# Patient Record
Sex: Male | Born: 1940 | Race: White | Hispanic: No | Marital: Single | Smoking: Former smoker
Health system: Southern US, Community
[De-identification: ages and names within clinical notes are randomized; demographics above are authoritative.]

## PROBLEM LIST (undated history)

## (undated) DIAGNOSIS — I351 Nonrheumatic aortic (valve) insufficiency: Secondary | ICD-10-CM

## (undated) DIAGNOSIS — F32A Depression, unspecified: Secondary | ICD-10-CM

## (undated) DIAGNOSIS — I493 Ventricular premature depolarization: Secondary | ICD-10-CM

## (undated) DIAGNOSIS — I447 Left bundle-branch block, unspecified: Secondary | ICD-10-CM

## (undated) DIAGNOSIS — I4891 Unspecified atrial fibrillation: Secondary | ICD-10-CM

## (undated) DIAGNOSIS — Z8679 Personal history of other diseases of the circulatory system: Secondary | ICD-10-CM

## (undated) DIAGNOSIS — K7460 Unspecified cirrhosis of liver: Secondary | ICD-10-CM

## (undated) DIAGNOSIS — N529 Male erectile dysfunction, unspecified: Secondary | ICD-10-CM

## (undated) DIAGNOSIS — T7840XA Allergy, unspecified, initial encounter: Secondary | ICD-10-CM

## (undated) DIAGNOSIS — F419 Anxiety disorder, unspecified: Secondary | ICD-10-CM

## (undated) DIAGNOSIS — Z8619 Personal history of other infectious and parasitic diseases: Secondary | ICD-10-CM

## (undated) DIAGNOSIS — F329 Major depressive disorder, single episode, unspecified: Secondary | ICD-10-CM

## (undated) DIAGNOSIS — I10 Essential (primary) hypertension: Secondary | ICD-10-CM

## (undated) HISTORY — DX: Personal history of other infectious and parasitic diseases: Z86.19

## (undated) HISTORY — DX: Allergy, unspecified, initial encounter: T78.40XA

## (undated) HISTORY — DX: Anxiety disorder, unspecified: F41.9

## (undated) HISTORY — DX: Personal history of other diseases of the circulatory system: Z86.79

## (undated) HISTORY — DX: Major depressive disorder, single episode, unspecified: F32.9

## (undated) HISTORY — PX: OTHER SURGICAL HISTORY: SHX169

## (undated) HISTORY — DX: Depression, unspecified: F32.A

## (undated) HISTORY — DX: Male erectile dysfunction, unspecified: N52.9

---
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
## 2005-02-14 ENCOUNTER — Encounter: Payer: Self-pay | Admitting: Family Medicine

## 2005-03-04 ENCOUNTER — Encounter: Payer: Self-pay | Admitting: Family Medicine

## 2006-11-25 ENCOUNTER — Encounter: Payer: Self-pay | Admitting: Family Medicine

## 2006-11-25 LAB — CONVERTED CEMR LAB
Cholesterol: 152 mg/dL
Glucose, Bld: 100 mg/dL
HDL: 60 mg/dL
LDL Cholesterol: 78 mg/dL

## 2008-05-03 ENCOUNTER — Encounter: Payer: Self-pay | Admitting: Family Medicine

## 2010-04-29 DIAGNOSIS — Z8679 Personal history of other diseases of the circulatory system: Secondary | ICD-10-CM

## 2010-04-29 HISTORY — DX: Personal history of other diseases of the circulatory system: Z86.79

## 2010-06-05 ENCOUNTER — Ambulatory Visit: Payer: Self-pay | Admitting: Family Medicine

## 2010-06-07 ENCOUNTER — Ambulatory Visit (INDEPENDENT_AMBULATORY_CARE_PROVIDER_SITE_OTHER): Payer: Medicare Other | Admitting: Family Medicine

## 2010-06-07 ENCOUNTER — Encounter: Payer: Self-pay | Admitting: Family Medicine

## 2010-06-07 DIAGNOSIS — I493 Ventricular premature depolarization: Secondary | ICD-10-CM | POA: Insufficient documentation

## 2010-06-07 DIAGNOSIS — Z23 Encounter for immunization: Secondary | ICD-10-CM

## 2010-06-07 DIAGNOSIS — N529 Male erectile dysfunction, unspecified: Secondary | ICD-10-CM

## 2010-06-07 DIAGNOSIS — Z9189 Other specified personal risk factors, not elsewhere classified: Secondary | ICD-10-CM | POA: Insufficient documentation

## 2010-06-07 DIAGNOSIS — F341 Dysthymic disorder: Secondary | ICD-10-CM | POA: Insufficient documentation

## 2010-06-07 DIAGNOSIS — J309 Allergic rhinitis, unspecified: Secondary | ICD-10-CM | POA: Insufficient documentation

## 2010-06-07 DIAGNOSIS — I00 Rheumatic fever without heart involvement: Secondary | ICD-10-CM | POA: Insufficient documentation

## 2010-06-07 DIAGNOSIS — Z0000 Encounter for general adult medical examination without abnormal findings: Secondary | ICD-10-CM

## 2010-06-07 DIAGNOSIS — K219 Gastro-esophageal reflux disease without esophagitis: Secondary | ICD-10-CM

## 2010-06-07 DIAGNOSIS — I351 Nonrheumatic aortic (valve) insufficiency: Secondary | ICD-10-CM | POA: Insufficient documentation

## 2010-06-08 ENCOUNTER — Telehealth (INDEPENDENT_AMBULATORY_CARE_PROVIDER_SITE_OTHER): Payer: Self-pay | Admitting: *Deleted

## 2010-06-11 ENCOUNTER — Encounter (INDEPENDENT_AMBULATORY_CARE_PROVIDER_SITE_OTHER): Payer: Self-pay | Admitting: *Deleted

## 2010-06-11 ENCOUNTER — Ambulatory Visit: Payer: Medicare Other

## 2010-06-11 ENCOUNTER — Other Ambulatory Visit: Payer: Self-pay | Admitting: Family Medicine

## 2010-06-11 DIAGNOSIS — Z136 Encounter for screening for cardiovascular disorders: Secondary | ICD-10-CM

## 2010-06-11 DIAGNOSIS — Z833 Family history of diabetes mellitus: Secondary | ICD-10-CM

## 2010-06-11 LAB — BASIC METABOLIC PANEL
CO2: 28 mEq/L (ref 19–32)
Calcium: 9.1 mg/dL (ref 8.4–10.5)
Creatinine, Ser: 1.1 mg/dL (ref 0.4–1.5)
Glucose, Bld: 91 mg/dL (ref 70–99)

## 2010-06-11 LAB — LIPID PANEL
HDL: 49.2 mg/dL (ref >39.00)
Triglycerides: 45 mg/dL (ref 0.0–149.0)

## 2010-06-11 LAB — HEPATIC FUNCTION PANEL
Albumin: 3.8 g/dL (ref 3.5–5.2)
Alkaline Phosphatase: 48 U/L (ref 39–117)
Total Protein: 6.6 g/dL (ref 6.0–8.3)

## 2010-06-14 ENCOUNTER — Encounter: Payer: Self-pay | Admitting: Family Medicine

## 2010-06-14 NOTE — Assessment & Plan Note (Signed)
Summary: new pt to est JRT   Vital Signs:  Patient profile:   70 year old male Height:      75 inches Weight:      251.25 pounds BMI:     31.52 Temp:     97.9 degrees F oral Pulse rate:   84 / minute Pulse rhythm:   regular BP sitting:   156 / 76  (left arm) Cuff size:   large  Vitals Entered By: Christena Deem CMA Deborra Medina) (June 07, 2010 2:02 PM) CC: New patient to Establish   History of Present Illness: New patient to clinic. Prev [facility] and [facility] patient.    CPE: See plan. Healthy habits d/w patient, encouarged.   Bothered by chronic nasal congestion and cough, esp at night.  This has been going on for years.  He describes a potential reflux component with occ heartburn.    ED- occ cialis use, out of it now.  Prev with good relief.  BP prev controlled.   Anxiety:  Prev on xanax during period of stress when a relationship fell apart.  Caring for father on hospice.  Xanax has helped the patient; prev w/o side effects.  No SI/HI.   H/o depression but this has been controlled off SSRI/other meds.   H/o rheumatic fever.   H/o heart murmur.  Years since seeing cards. Needs to reest care.  Was prev on BB for arrhythmia, but came off this years ago.  No CP, shortness of breath, and only minimal bilateral lower extremity edema (trace, and this is episodic).  Preventive Screening-Counseling & Management  Alcohol-Tobacco     Smoking Status: quit  Caffeine-Diet-Exercise     Does Patient Exercise: no      Drug Use:  no.    Allergies (verified): 1)  ! * Flonase  Past History:  Family History: Last updated: 06/07/2010 Family History Hypertension, other blood relative Family History Ovarian cancer, parents Family History of Prostate CA 1st degree relative <50, parents Family History Uterine cancer, parents Family History of Heart disease, other blood relative Family History Diabetes 1st degree relative, other blood relative F alive in his 51s, COPD on hospice, hip replaced in  his 1s.  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 of GERD (ICD-530.81) ORGANIC IMPOTENCE (ICD-607.84) FAMILY HISTORY DIABETES 1ST DEGREE RELATIVE (ICD-V18.0) FAMILY HISTORY OF CAD MALE 1ST DEGREE RELATIVE <60 (ICD-V16.49) ANXIETY DEPRESSION (ICD-300.4)  Past Surgical History: 1947  Tonsils and adenoids R elbow bursa removed  H/o of cautery for nosebleeds  Family History: Family History Hypertension, other blood relative Family History Ovarian cancer, parents Family History of Prostate CA 1st degree relative <50, parents Family History Uterine cancer, parents Family History of Heart disease, other blood relative Family History Diabetes 1st degree relative, other blood relative F alive in his 40s, COPD on hospice, hip replaced in his 69s.  M dead, ovarian cancer, AAA in 06/27/1983  Social History: Occupation:  Environmental education officer, retired Education:  Personnel officer Divorced Former Smoker Alcohol use-yes, rare Drug use-no Regular exercise-no enjoys travel/riding motorcycle/nascarSmoking Status:  quit Drug Use:  no Does Patient Exercise:  no  Review of Systems       See HPI.  Otherwise  negative.    Physical Exam  General:  no apparent distress normocephalic atraumatic tm wnl clear rhinorrhea op w/o erythema neck supple regular rate and rhythm with occ ectopy, SEM noted clear to auscultation bilaterally abdomen obese, not tender to palpation ext w/trace edema bilaterally Prostate:  Prostate gland firm and smooth, mild enlargement, but no nodularity, tenderness,  mass, asymmetry or induration.   Impression & Recommendations:  Problem # 1:  Preventive Health Care (ICD-V70.0) health habits d/w patient, esp diet and exercise. flu shot encouraged.  PSA options were discussed along with recent recs.  No indication for psa at this point, since patient is low risk and there is no FH of prosate CA.  He declined testing of PSA.  zostavax encouracted, PNA vaccine done today. colonoscopy 2011, requesting records.   Problem # 2:  FAMILY HISTORY DIABETES 1ST DEGREE RELATIVE (ICD-V18.0) Return for labs.    Problem # 3:  ANXIETY DEPRESSION (ICD-300.4) Start xanax and use as needed wiht sedation caution.  He agrees.  i think this is likely exacerbate by father's illness and he has no SI/HI.  Okay for outpatient follow up.   Problem # 4:  CARDIAC MURMUR (ICD-785.2) Refer back to [facility] [employer] to reest care.  Orders: Cardiology Referral (Cardiology)  Problem # 5:  ORGANIC IMPOTENCE (ICD-607.84) Restart cialis.  no CP and no NTG use.  His updated medication list for this problem includes:    Cialis 20 Mg Tabs (Tadalafil) .Marland Kitchen... 1/2 to 1 tab by mouth once daily as needed  Problem # 6:  ? of GERD (ICD-530.81) With cough.  intolerant of nasal steroids prev.  Start with zantac and see if this affects symptoms.  Pt is aware that he is overweight.   Complete Medication List: 1)  Alprazolam 0.25 Mg Tabs (Alprazolam) .Marland Kitchen.. 1 by mouth three times a day as needed for anxiety, sedation caution 2)  Cialis 20 Mg Tabs (Tadalafil) .... 1/2 to 1 tab by mouth once daily as needed 3)  Tessalon 200 Mg Caps (Benzonatate) .Marland Kitchen.. 1 by mouth three times a day as needed for cough  Other Orders: Pneumococcal Vaccine (54270) Admin 1st Vaccine [phone])   Patient Instructions: 1)  Check with your insurance to see if they will cover the shingles shot.  I would get a flu shot. 2)  We'll request your records.   3)  Come back for labs -fasting cmet/lipid- v18.0.  4)  Try the xanax as needed  three times a day.  It can make you drowsy.  Call me with an update. 5)  Take the cialis as needed.   6)  See Rosaria Ferries about your referral before your leave today.   7)  Use zantac/ranitidine 35m by mouth two times a day and see if that helps with your congestion in your chest/heartburn.  let me know if you aren't improving.  8)  Take care.  Prescriptions: TESSALON 200 MG CAPS (BENZONATATE) 1 by mouth three times a day as needed for cough  #30 x 1   Entered and Authorized by:   GElsie StainMD   Signed by:   GElsie StainMD on 06/07/2010   Method used:   Print then Give to Patient   RxID:   [id number] 20 MG TABS (TADALAFIL) 1/2 to 1 tab by mouth once daily as needed  #10 x 1   Entered and Authorized by:   GElsie StainMD   Signed by:   GElsie StainMD on 06/07/2010   Method  used:   Print then Give to Patient   RxID:   [id number] ALPRAZOLAM 0.25 MG TABS (ALPRAZOLAM) 1 by mouth three times a day as needed for anxiety, sedation caution  #30 x 1   Entered and Authorized by:   Elsie Stain MD   Signed by:   Elsie Stain MD on 06/07/2010   Method used:   Print then Give to Patient   RxID:   [id number]    Orders Added: 1)  New Patient 65&> [92010] 2)  New Patient Level II [07121] 3)  Cardiology Referral [Cardiology] 4)  Pneumococcal Vaccine [90732] 5)  Admin 1st Vaccine [97588]   Immunizations Administered:  Pneumonia Vaccine:    Vaccine Type: Pneumovax (Medicare)    Site: left deltoid    Mfr: Merck    Dose: 0.5 ml    Route: IM    Given by: Christena Deem CMA ([place])    Exp. Date: 09/21/2011    Lot #: [id number]    VIS given: 04/03/09 version given June 07, 2010.   Immunizations Administered:  Pneumonia Vaccine:    Vaccine Type: Pneumovax (Medicare)    Site: left deltoid    Mfr: Merck    Dose: 0.5 ml    Route: IM    Given by: Christena Deem CMA ([place])    Exp. Date: 09/21/2011    Lot #: [id number]    VIS given: 04/03/09 version given June 07, 2010.  Prior Medications (reviewed today): None Current Allergies (reviewed today): ! [facility]

## 2010-06-14 NOTE — Progress Notes (Signed)
----   Converted from flag ---- ---- 06/07/2010 10:51 PM, Elsie Stain MD wrote: I think this is reasonable.  Please notify patient and convert this to a note (or copy it into the phone note).  thanks.   ---- 06/07/2010 3:06 PM, Christena Deem CMA (AAMA) wrote: As this guy was leaving, he asked what to do about his BP.  It said the reading that I got was higher than usual.  He said he monitored it for about a month with a machine that was calibrated by a MD and it stayed around 135/80.  Shall we just check it again at his next OV? ------------------------------

## 2010-06-18 ENCOUNTER — Encounter: Payer: Self-pay | Admitting: Family Medicine

## 2010-06-19 ENCOUNTER — Encounter: Payer: Self-pay | Admitting: Family Medicine

## 2010-06-20 NOTE — Assessment & Plan Note (Signed)
Summary: FASTING CMET LIPID V18.0/RBH   Allergies: 1)  ! * Flonase   Complete Medication List: 1)  Alprazolam 0.25 Mg Tabs (Alprazolam) .Marland Kitchen.. 1 by mouth three times a day as needed for anxiety, sedation caution 2)  Cialis 20 Mg Tabs (Tadalafil) .... 1/2 to 1 tab by mouth once daily as needed 3)  Tessalon 200 Mg Caps (Benzonatate) .Marland Kitchen.. 1 by mouth three times a day as needed for cough  Other Orders: TLB-BMP (Basic Metabolic Panel-BMET) (96295-MWUXLKG) TLB-Hepatic/Liver Function Pnl (80076-HEPATIC) TLB-Lipid Panel (80061-LIPID)   Orders Added: 1)  TLB-BMP (Basic Metabolic Panel-BMET) [40102-VOZDGUY] 2)  TLB-Hepatic/Liver Function Pnl [80076-HEPATIC] 3)  TLB-Lipid Panel [80061-LIPID]

## 2010-06-23 DIAGNOSIS — I447 Left bundle-branch block, unspecified: Secondary | ICD-10-CM | POA: Insufficient documentation

## 2010-06-26 NOTE — Miscellaneous (Signed)
  Clinical Lists Changes  Observations: Added new observation of COLONNXTDUE: 06/2018 (06/19/2010 13:55) Added new observation of COLONOSCOPY: Diverticulosis (06/14/2008 13:57)      Preventive Care Screening  Colonoscopy:    Date:  06/14/2008    Next Due:  06/2018    Results:  Diverticulosis     Multiple medium-mouthed diverticula were found in the sigmoid colon, in the descending colon and in the transverse colon.

## 2010-06-26 NOTE — Letter (Signed)
Summary: [facility] Cardiology  [facility]iology   Imported By: Laural Benes 06/22/2010 15:20:40  _____________________________________________________________________  External Attachment:    Type:   Image     Comment:   External Document  Appended Document: [facility] Cardiology    Clinical Lists Changes  Problems: Added new problem of LBBB (ICD-426.3) Observations: Added new observation of PAST MED HX: DEPRESSION (ICD-311) ALLERGIC RHINITIS (ICD-477.9) CHICKENPOX, HX OF (ICD-V15.9) h/o RHEUMATIC FEVER (ICD-390), (per patient)- eval by Sadie [facility]er cards 2012 LBBB with PVCs ? of GERD (ICD-530.81) ORGANIC IMPOTENCE (ICD-607.84) FAMILY HISTORY DIABETES 1ST DEGREE RELATIVE (ICD-V18.0) FAMILY HISTORY OF CAD MALE 1ST DEGREE RELATIVE <60 (ICD-V16.49) ANXIETY DEPRESSION (ICD-300.4)  (06/23/2010 21:41)       Past History:  Past Medical History: DEPRESSION (ICD-311) ALLERGIC RHINITIS (ICD-477.9) CHICKENPOX, HX OF (ICD-V15.9) h/o RHEUMATIC FEVER (ICD-390), (per patient)- eval by Sadie Haber cards 2012 LBBB with PVCs ? of GERD (ICD-530.81) ORGANIC IMPOTENCE (ICD-607.84) FAMILY HISTORY DIABETES 1ST DEGREE RELATIVE (ICD-V18.0) FAMILY HISTORY OF CAD MALE 1ST DEGREE RELATIVE <60 (ICD-V16.49) ANXIETY DEPRESSION (ICD-300.4)

## 2010-06-27 ENCOUNTER — Encounter: Payer: Self-pay | Admitting: Family Medicine

## 2010-07-05 NOTE — Procedures (Signed)
Summary: colonoscopy  colonoscopy   Imported By: Jamelle Haring 06/26/2010 07:54:21  _____________________________________________________________________  External Attachment:    Type:   Image     Comment:   External Document

## 2010-07-05 NOTE — Letter (Signed)
Summary: office note  office note   Imported By: Jamelle Haring 06/26/2010 07:56:03  _____________________________________________________________________  External Attachment:    Type:   Image     Comment:   External Document

## 2010-07-05 NOTE — Miscellaneous (Signed)
  Clinical Lists Changes  Observations: Added new observation of TD BOOSTER: Td (11/25/2006 8:28) Added new observation of PSA: 0.7 ng/mL (11/25/2006 8:24) Added new observation of CREATININE: 0.95 mg/dL (11/25/2006 8:24) Added new observation of BG RANDOM: 100 mg/dL (11/25/2006 8:24) Added new observation of LDL: 78 mg/dL (11/25/2006 8:24) Added new observation of HDL: 60 mg/dL (11/25/2006 8:24) Added new observation of TRIGLYC TOT: 72 mg/dL (11/25/2006 8:24) Added new observation of CHOLESTEROL: 152 mg/dL (11/25/2006 8:24)      Preventive Care Screening  Last Tetanus Booster:    Date:  11/25/2006    Results:  Td  PSA:    Date:  11/25/2006    Results:  0.7 ng/mL

## 2011-07-25 ENCOUNTER — Telehealth: Payer: Self-pay | Admitting: Family Medicine

## 2011-07-25 ENCOUNTER — Emergency Department (HOSPITAL_COMMUNITY)
Admission: EM | Admit: 2011-07-25 | Discharge: 2011-07-25 | Disposition: A | Discharge status: Home / Self Care | Payer: Medicare Other | Source: Home / Self Care | Attending: Emergency Medicine | Admitting: Emergency Medicine

## 2011-07-25 ENCOUNTER — Encounter (HOSPITAL_COMMUNITY): Payer: Self-pay | Admitting: *Deleted

## 2011-07-25 DIAGNOSIS — E860 Dehydration: Secondary | ICD-10-CM

## 2011-07-25 HISTORY — DX: Essential (primary) hypertension: I10

## 2011-07-25 LAB — POCT URINALYSIS DIP (DEVICE)
Ketones, ur: NEGATIVE mg/dL
Protein, ur: 30 mg/dL — AB
Specific Gravity, Urine: 1.02 (ref 1.005–1.030)
pH: 6 (ref 5.0–8.0)

## 2011-07-25 LAB — POCT I-STAT, CHEM 8
BUN: 20 mg/dL (ref 6–23)
Calcium, Ion: 1.15 mmol/L (ref 1.12–1.32)
Creatinine, Ser: 1 mg/dL (ref 0.50–1.35)
TCO2: 24 mmol/L (ref 0–100)

## 2011-07-25 NOTE — Discharge Instructions (Signed)
Today we have discussed your current physical exam and lab evaluation, suspected so your symptoms could be related to a dehydration status. Have encouraged you to drink no minimum of 2 L in the next 12 hours and be conscious and alert about your urinary status after 12 hours.  Abdominal discomfort persist please try to monitor your temperatures and followup with her primary care Dr. has discuss    Dehydration, Adult Dehydration is when you lose more fluids from the body than you take in. Vital organs like the kidneys, brain, and heart cannot function without a proper amount of fluids and salt. Any loss of fluids from the body can cause dehydration.  CAUSES   Vomiting.   Diarrhea.   Excessive sweating.   Excessive urine output.   Fever.  SYMPTOMS  Mild dehydration  Thirst.   Dry lips.   Slightly dry mouth.  Moderate dehydration  Very dry mouth.   Sunken eyes.   Skin does not bounce back quickly when lightly pinched and released.   Dark urine and decreased urine production.   Decreased tear production.   Headache.  Severe dehydration  Very dry mouth.   Extreme thirst.   Rapid, weak pulse (more than 100 beats per minute at rest).   Cold hands and feet.   Not able to sweat in spite of heat and temperature.   Rapid breathing.   Blue lips.   Confusion and lethargy.   Difficulty being awakened.   Minimal urine production.   No tears.  DIAGNOSIS  Your caregiver will diagnose dehydration based on your symptoms and your exam. Blood and urine tests will help confirm the diagnosis. The diagnostic evaluation should also identify the cause of dehydration. TREATMENT  Treatment of mild or moderate dehydration can often be done at home by increasing the amount of fluids that you drink. It is best to drink small amounts of fluid more often. Drinking too much at one time can make vomiting worse. Refer to the home care instructions below. Severe dehydration needs to  be treated at the hospital where you will probably be given intravenous (IV) fluids that contain water and electrolytes. HOME CARE INSTRUCTIONS   Ask your caregiver about specific rehydration instructions.   Drink enough fluids to keep your urine clear or pale yellow.   Drink small amounts frequently if you have nausea and vomiting.   Eat as you normally do.   Avoid:   Foods or drinks high in sugar.   Carbonated drinks.   Juice.   Extremely hot or cold fluids.   Drinks with caffeine.   Fatty, greasy foods.   Alcohol.   Tobacco.   Overeating.   Gelatin desserts.   Wash your hands well to avoid spreading bacteria and viruses.   Only take over-the-counter or prescription medicines for pain, discomfort, or fever as directed by your caregiver.   Ask your caregiver if you should continue all prescribed and over-the-counter medicines.   Keep all follow-up appointments with your caregiver.  SEEK MEDICAL CARE IF:  You have abdominal pain and it increases or stays in one area (localizes).   You have a rash, stiff neck, or severe headache.   You are irritable, sleepy, or difficult to awaken.   You are weak, dizzy, or extremely thirsty.  SEEK IMMEDIATE MEDICAL CARE IF:   You are unable to keep fluids down or you get worse despite treatment.   You have frequent episodes of vomiting or diarrhea.   You have blood or  green matter (bile) in your vomit.   You have blood in your stool or your stool looks black and tarry.   You have not urinated in 6 to 8 hours, or you have only urinated a small amount of very dark urine.   You have a fever.   You faint.  MAKE SURE YOU:   Understand these instructions.   Will watch your condition.   Will get help right away if you are not doing well or get worse.  Document Released: 04/15/2005 Document Revised: 04/04/2011 Document Reviewed: 12/03/2010 [facility] Patient Information 2012 [place].

## 2011-07-25 NOTE — Telephone Encounter (Signed)
Noted, agree with UC.

## 2011-07-25 NOTE — ED Provider Notes (Signed)
History     CSN: [id number]  Arrival date & time 07/25/11  1218   First MD Initiated Contact with Patient 07/25/11 1319      Chief Complaint  Patient presents with  . Nausea    (Consider location/radiation/quality/duration/timing/severity/associated sxs/prior treatment) HPI Comments: Patient presents urgent care complaining of ongoing abdominal discomforts were associated with some loose stools for about 3 days. "I don't have diarrheas or pains" is a mild discomfort in my stools to change her couple of days been more soft and "baby like". Have not had any blood in it or any mucoid material, no nausea or vomiting, no fevers. I have noticed that I have not gone to urinate tendinitis I usually do and have urinated very little, although I feel have not been eating been eating and drinking well for the last couple days. "I'm just not feeling well" Patient denies any other symptoms including respiratory symptoms, or chest pains  The history is provided by the patient.    Past Medical History  Diagnosis Date  . Hypertension   . Heart block bundle branch     History reviewed. No pertinent past surgical history.  History reviewed. No pertinent family history.  History  Substance Use Topics  . Smoking status: Not on file  . Smokeless tobacco: Not on file  . Alcohol Use:       Review of Systems  Constitutional: Positive for appetite change and fatigue. Negative for fever.  Respiratory: Negative for cough.   Cardiovascular: Negative for chest pain.  Gastrointestinal: Negative for nausea, vomiting, abdominal pain, diarrhea and rectal pain.  Genitourinary: Negative for urgency, frequency and testicular pain.  Skin: Negative for rash.  Neurological: Negative for dizziness and headaches.    Allergies  Fluticasone propionate  Home Medications   Current Outpatient Rx  Name Route Sig Dispense Refill  . METOPROLOL SUCCINATE ER 25 MG PO TB24 Oral Take 25 mg by mouth daily.       BP 128/94  Pulse 92  Temp(Src) 98.9 F (37.2 C) (Oral)  Resp 14  SpO2 96%  Physical Exam  Nursing note and vitals reviewed. Constitutional: He is oriented to person, place, and time. He appears well-developed and well-nourished. No distress.  HENT:  Head: Normocephalic.  Mouth/Throat: No oropharyngeal exudate.  Eyes: Conjunctivae are normal.  Neck: Neck supple. No JVD present.  Cardiovascular: Normal rate.   No murmur heard. Pulmonary/Chest: Effort normal. No respiratory distress. He has no wheezes.  Abdominal: Soft. He exhibits no distension. There is no tenderness. There is no rigidity, no rebound, no guarding, no CVA tenderness, no tenderness at McBurney's point and negative Murphy's sign.  Musculoskeletal: Normal range of motion.  Lymphadenopathy:    He has no cervical adenopathy.  Neurological: He is alert and oriented to person, place, and time.  Skin: Skin is warm. He is not diaphoretic. No erythema.    ED Course  Procedures (including critical care time)  Labs Reviewed  POCT URINALYSIS DIP (DEVICE) - Abnormal; Notable for the following:    Protein, ur 30 (*)    All other components within normal limits  POCT I-STAT, CHEM 8 - Abnormal; Notable for the following:    Glucose, Bld 110 (*)    Hemoglobin 18.4 (*)    HCT 54.0 (*)    All other components within normal limits   No results found.   1. Dehydration       MDM  Vague abdominal discomforts for 3 days with a soft abdomen during  today's exam. Patient also reports a decreased urinary output since yesterday. Patient denies any vomiting, nausea or fevers during exam and interview looks comfortable. Mild hemoconcentration with clinical appearance of a mild dehydration we discussed at length lab results future need for glucose fasting level. In oral rehydration plan for the next 24 hours        Rosana Hoes, MD 07/25/11 1630

## 2011-07-25 NOTE — Telephone Encounter (Signed)
Triage Record Num: [id number] Operator: Rosendo Gros DiMatteis Patient Name: Richard Saunders Call Date & Time: 07/25/2011 10:40:44AM Patient Phone: [phone] PCP: Elsie Stain Patient Gender: Male PCP Fax : Patient DOB: 1940-06-15 Practice Name: [facility] Reason for Call: Caller: Ahmari/Patient; PCP: Elsie Stain Brigitte Pulse); CB#: [phone]; Call regarding no urination since 11PM on 07/24/11; he did have pressure and bloating in stomach since 07/22/11; soft stools but no diarrhea; no fever; Triaged per Urinary Symptoms-Male Guideline; See in 4 hr d/t no urination for 8 or more hours; no appt for today at the office; will go to [facility] today; will comply Protocol(s) Used: Urinary Symptoms - Male Recommended Outcome per Protocol: See Provider within 4 hours Reason for Outcome: No urination for 8 or more hours Care Advice: ~ Another adult should drive. Avoid fluids containing caffeine (coffee, tea, cola or chocolate), alcohol, or citrus juice because they may irritate the bladder. Use of tobacco can also be a bladder irritant. ~ ~ Limit fluid intake to sips, not to exceed eight ounces per hour. ~ Call provider immediately if having severe pain from the retention of urine. ~ List, or take, all current prescription(s), nonprescription or alternative medication(s) to provider for evaluation. 07/25/2011 10:53:42AM Page 1 of 1 [email or web address]

## 2011-07-25 NOTE — ED Notes (Signed)
Pt  Reports  Symptoms  Of  Nausea      Dyspepsia   And  Loose  Stool   X  3  Days     Pt   States  He  Also  Has  Decreased  Urinary output as  Well            He  Is  Awake  As  Well as  Alert  And  Oriented

## 2011-09-26 ENCOUNTER — Ambulatory Visit (INDEPENDENT_AMBULATORY_CARE_PROVIDER_SITE_OTHER): Payer: Medicare Other | Admitting: Family Medicine

## 2011-09-26 ENCOUNTER — Encounter: Payer: Self-pay | Admitting: Family Medicine

## 2011-09-26 VITALS — BP 148/74 | HR 52 | Temp 99.1°F | Wt 250.0 lb

## 2011-09-26 DIAGNOSIS — R05 Cough: Secondary | ICD-10-CM

## 2011-09-26 DIAGNOSIS — R059 Cough, unspecified: Secondary | ICD-10-CM | POA: Insufficient documentation

## 2011-09-26 MED ORDER — AZITHROMYCIN 250 MG PO TABS: ORAL_TABLET | ORAL | Status: AC

## 2011-09-26 NOTE — Assessment & Plan Note (Signed)
I would treat given his history and the asymmetry in breath sounds, though he has no focal dec in BS.  He agrees.  Supportive tx o/w and f/u prn.  Nontoxic.

## 2011-09-26 NOTE — Patient Instructions (Signed)
Drink plenty of fluids, take plain mucinex as needed, and use nasal saline for your congestion  This should gradually improve.  Take care.  Let us know if you have other concerns.  Start the antibiotics.

## 2011-09-26 NOTE — Progress Notes (Signed)
H/o chronic sinusitis and allergy symptoms.  R nostril bleeding this AM.  HA and mucous discharge recently.  Sx going for about 1 week.  Eyes are injected.  Eyes were watering more recently.  He hasn't used nasal saline.  Felt like he had fever, chills. No documented fevers.  Coughing, some discolored sputum.  Cough worse at night.  This is all worse than his typical allergy episodes.    Meds, vitals, and allergies reviewed.   ROS: See HPI.  Otherwise, noncontributory.  GEN: nad, alert and oriented HEENT: mucous membranes moist, tm w/o erythema, nasal exam w/ mild erythema but no active bleeding, clear discharge noted,  OP with cobblestoning, eyes injected B, max/frontal sinuses not ttp NECK: supple w/o LA CV: rrr.   PULM: no inc wob, but with coarser BS on the right than on the left, no focal dec in BS EXT: no edema

## 2011-10-17 ENCOUNTER — Ambulatory Visit (INDEPENDENT_AMBULATORY_CARE_PROVIDER_SITE_OTHER): Payer: Medicare Other | Admitting: Family Medicine

## 2011-10-17 ENCOUNTER — Encounter: Payer: Self-pay | Admitting: Family Medicine

## 2011-10-17 VITALS — BP 136/78 | HR 59 | Temp 98.2°F | Wt 246.0 lb

## 2011-10-17 DIAGNOSIS — J069 Acute upper respiratory infection, unspecified: Secondary | ICD-10-CM

## 2011-10-17 DIAGNOSIS — J029 Acute pharyngitis, unspecified: Secondary | ICD-10-CM

## 2011-10-17 NOTE — Progress Notes (Signed)
He was seen 09/26/11 and was getting better, cough was improved. He had less mucous production.    Now- Cough isn't as bad as at prev presentation.  Now recently with HA- frontal- pain better with local heat application.  Some ear ringing. He had some LA on the R side of neck, enlarged and tender, though improved today per patient.  He has post nasal gtt and subsequent cough.  Roof of mouth is sore.   He had cards f/u last week with unremarkable exam but patient is currently wearing ambulatory monitor.    Meds, vitals, and allergies reviewed.   ROS: See HPI.  Otherwise, noncontributory.  nad ncat Tm w/o erythema Nasal exam w/o erythema Mmm, no exudates but soft palate with typical appearing B small white (likely viral) blisters Neck with R sided tender LA rrr Ctab, no wheeze and no focal dec in BS, symmetric BS

## 2011-10-17 NOTE — Patient Instructions (Addendum)
Drink plenty of fluids, take tylenol as needed, and gargle with warm salt water for your throat.  This should gradually improve.  Take care.  Let us know if you have other concerns.

## 2011-10-18 DIAGNOSIS — R0982 Postnasal drip: Secondary | ICD-10-CM | POA: Insufficient documentation

## 2011-10-18 NOTE — Assessment & Plan Note (Signed)
With exam typical for a viral process. Nontoxic, supportive tx for now and f/u prn.  If persistent cough, post nasal gtt, etc may need recheck vs ENT eval. D/w pt.

## 2012-05-29 ENCOUNTER — Ambulatory Visit (INDEPENDENT_AMBULATORY_CARE_PROVIDER_SITE_OTHER): Payer: Medicare Other | Admitting: Family Medicine

## 2012-05-29 ENCOUNTER — Encounter: Payer: Self-pay | Admitting: Family Medicine

## 2012-05-29 VITALS — BP 126/86 | HR 56 | Temp 97.6°F | Wt 242.0 lb

## 2012-05-29 DIAGNOSIS — J069 Acute upper respiratory infection, unspecified: Secondary | ICD-10-CM

## 2012-05-29 MED ORDER — MOMETASONE FUROATE 50 MCG/ACT NA SUSP: NASAL | Status: DC

## 2012-05-29 NOTE — Progress Notes (Signed)
He has had gradual excalation of sinus sx with congestion and then productive cough from post nasal gtt.  He was getting better and coughing less and he thought he was getting better until this AM.  This AM with some nasal discharge.  No fevers.  Cough is minimal today.  No vomiting, no diarrhea.  No rash.    Meds, vitals, and allergies reviewed.   ROS: See HPI.  Otherwise, noncontributory.  GEN: nad, alert and oriented HEENT: mucous membranes moist, tm w/o erythema, nasal exam w/o erythema, clear discharge noted,  OP with cobblestoning, sinuses not ttp NECK: supple w/o LA CV: rrr.   PULM: ctab, no inc wob EXT: no edema SKIN: no acute rash

## 2012-05-29 NOTE — Patient Instructions (Addendum)
Try the nasonex and see if you can tolerate that.  Call me if not improved.

## 2012-05-31 NOTE — Assessment & Plan Note (Signed)
Nontoxic.  Likely viral.  We talked about nasal steroids.  He didn't tolerate the taste/smell of flonase.  Would change to nasonex and f/u prn.  He agrees.  Nose bleed cautions given with nasal steroid.

## 2012-06-08 ENCOUNTER — Ambulatory Visit (INDEPENDENT_AMBULATORY_CARE_PROVIDER_SITE_OTHER): Payer: Medicare Other | Admitting: Family Medicine

## 2012-06-08 ENCOUNTER — Encounter: Payer: Self-pay | Admitting: Family Medicine

## 2012-06-08 VITALS — BP 134/90 | HR 68 | Temp 98.4°F | Wt 239.8 lb

## 2012-06-08 DIAGNOSIS — R0982 Postnasal drip: Secondary | ICD-10-CM

## 2012-06-08 MED ORDER — PREDNISONE 20 MG PO TABS: 20.0000 mg | ORAL_TABLET | Freq: Every day | ORAL | Status: DC

## 2012-06-08 NOTE — Patient Instructions (Addendum)
Take for prednisone for 5 days (or less).  Then start back on the nasonex and give me an update at that point.

## 2012-06-08 NOTE — Progress Notes (Signed)
He's been able to tolerate the nasonex compared to the flonase, but he isn't improved overall.  Still with sig nocturnal postnasal gtt and cough and then early AM cough.  He was talking with his brother with similar and he had improved with prednisone orally.  No fevers now.  He didn't know if he was having allergy sx that caused this.  His sputum is milky but not green.  He isn't coughing as much as clearing his throat.  He has minimal rhinorrhea now.    Meds, vitals, and allergies reviewed.   ROS: See HPI.  Otherwise, noncontributory.  GEN: nad, alert and oriented  HEENT: mucous membranes moist, tm w/o erythema, nasal exam w/o erythema, clear discharge noted, OP with cobblestoning, sinuses not ttp  NECK: supple w/o LA  CV: rrr.  PULM: ctab, no inc wob  EXT: no edema  SKIN: no acute rash

## 2012-06-09 NOTE — Assessment & Plan Note (Signed)
Trial of pred is reasonable with steroid cautions.  He agrees, will notify me with update.  See instructions.  He doesn't appear to be acutely infected and would not likely benefit from abx at this point.

## 2012-06-17 ENCOUNTER — Encounter: Payer: Self-pay | Admitting: Family Medicine

## 2012-06-17 ENCOUNTER — Telehealth: Payer: Self-pay | Admitting: Family Medicine

## 2012-06-17 MED ORDER — PREDNISONE 20 MG PO TABS: ORAL_TABLET | ORAL | Status: DC

## 2012-06-17 NOTE — Telephone Encounter (Signed)
I called pt.  No ADE from the prednisone.  We talked about options, ENT vs longer trial of pred.  Will proceed with pred trial again.  Call back as needed.  He agrees.  Steroid caution given.

## 2012-06-29 ENCOUNTER — Telehealth: Payer: Self-pay | Admitting: Family Medicine

## 2012-06-29 ENCOUNTER — Encounter: Payer: Self-pay | Admitting: Family Medicine

## 2012-06-29 NOTE — Telephone Encounter (Signed)
I called pt.  No answer.  Please try to call him tomorrow and get some details.  Thanks.

## 2012-06-29 NOTE — Telephone Encounter (Signed)
Patient says you have been treating him for a cough for a long time.  Patient said some new medications had been tried and as soon as he finished the medications, the same symptoms returned.  He was pretty vague with me and I couldn't get much information from him.  I asked if there were any new symptoms and he said "no, just the same old thing".

## 2012-06-30 NOTE — Telephone Encounter (Signed)
I did phone him before sending you the message and he would not give me any specific information.  See email note from yesterday.  I will try again, just wanted to be sure you saw my previous note.

## 2012-06-30 NOTE — Telephone Encounter (Signed)
I called pt.  He had improvement on the prednisone, regressed some off it, but then is some better today.  We talked about options.  I would like him to see ENT.  He agrees.  Referral placed.  Please call pt at [phone].

## 2012-07-20 ENCOUNTER — Encounter: Payer: Self-pay | Admitting: Family Medicine

## 2012-07-20 ENCOUNTER — Telehealth: Payer: Self-pay | Admitting: Family Medicine

## 2012-07-20 DIAGNOSIS — R739 Hyperglycemia, unspecified: Secondary | ICD-10-CM

## 2012-07-20 NOTE — Telephone Encounter (Signed)
I called pt.  He has a history of episodes that were suggestive of hypoglycemia and would resolve after a snack.   He had an episode this AM but his sugar - fasting- was 151.  I asked him to get a fasting lab visit and then a visit with me.  He agrees.

## 2012-07-20 NOTE — Telephone Encounter (Signed)
Appts scheduled:

## 2012-07-22 ENCOUNTER — Other Ambulatory Visit (INDEPENDENT_AMBULATORY_CARE_PROVIDER_SITE_OTHER): Payer: Medicare Other

## 2012-07-22 DIAGNOSIS — R739 Hyperglycemia, unspecified: Secondary | ICD-10-CM

## 2012-07-22 DIAGNOSIS — R7309 Other abnormal glucose: Secondary | ICD-10-CM

## 2012-07-22 LAB — BASIC METABOLIC PANEL
BUN: 24 mg/dL — ABNORMAL HIGH (ref 6–23)
CO2: 29 mEq/L (ref 19–32)
Chloride: 104 mEq/L (ref 96–112)
Creatinine, Ser: 1 mg/dL (ref 0.4–1.5)

## 2012-07-27 ENCOUNTER — Encounter: Payer: Self-pay | Admitting: Family Medicine

## 2012-07-27 ENCOUNTER — Ambulatory Visit (INDEPENDENT_AMBULATORY_CARE_PROVIDER_SITE_OTHER): Payer: Medicare Other | Admitting: Family Medicine

## 2012-07-27 VITALS — BP 132/82 | HR 67 | Temp 98.0°F | Wt 237.5 lb

## 2012-07-27 DIAGNOSIS — M79609 Pain in unspecified limb: Secondary | ICD-10-CM

## 2012-07-27 DIAGNOSIS — E162 Hypoglycemia, unspecified: Secondary | ICD-10-CM

## 2012-07-27 DIAGNOSIS — M79671 Pain in right foot: Secondary | ICD-10-CM

## 2012-07-27 DIAGNOSIS — M79673 Pain in unspecified foot: Secondary | ICD-10-CM | POA: Insufficient documentation

## 2012-07-27 NOTE — Progress Notes (Signed)
He had f/u with ENT. He is back on nasonex and zyrtec.  He'll do those for 1 month and then f/u with him.   He had sx suggestive of hypoglycemia (felt progressively weak with the episodes) that would resolve quickly with a snack. He had a fasting sugar of 151 out of the clinic.  Then he came in for fasting labs to discuss.  He notes feeling weak about 2 hours after eating a sugary breakfast. Skipping breakfast, eats a big lunch, big supper, and then a snack near bedtime.   R heel/AT prev tender. Not in heel lifts.  No trauma.  H/o gout.  Asking about options.   Meds, vitals, and allergies reviewed.   ROS: See HPI.  Otherwise, noncontributory.  nad Mmm rrr ctab R heel with prev soreness at the distal AT but no mass now and not ttp, not red.  Ankle not ttp o/w

## 2012-07-27 NOTE — Assessment & Plan Note (Signed)
Likely due to AT.  Would use heel lifts and stretch.  D/w pt.  He agrees.  Will f/u if he has a flare for me to recheck him.

## 2012-07-27 NOTE — Patient Instructions (Addendum)
Get breakfast every day and avoid sugary foods.  I would get a snack in the gap between the meals that are farthest apart.  Use a heel lift in the meantime. If it flares up again, let me see it.  Take care.

## 2012-07-27 NOTE — Assessment & Plan Note (Signed)
Likely.  Sx quickly resolved with a snack.  D/w pt about eating breakfast, avoiding sugary foods to prevent spikes in glucose.  He'll keep a snack nearby.  He agrees. Labs discussed.  No evidence of DM2.

## 2012-08-03 ENCOUNTER — Encounter: Payer: Self-pay | Admitting: Family Medicine

## 2012-08-03 ENCOUNTER — Ambulatory Visit (INDEPENDENT_AMBULATORY_CARE_PROVIDER_SITE_OTHER): Payer: Medicare Other | Admitting: Family Medicine

## 2012-08-03 VITALS — BP 142/90 | HR 67 | Temp 97.9°F | Wt 236.8 lb

## 2012-08-03 DIAGNOSIS — M109 Gout, unspecified: Secondary | ICD-10-CM

## 2012-08-03 MED ORDER — HYDROCODONE-ACETAMINOPHEN 5-325 MG PO TABS: 1.0000 | ORAL_TABLET | Freq: Four times a day (QID) | ORAL | Status: DC | PRN

## 2012-08-03 NOTE — Patient Instructions (Signed)
Go to the lab on the way out.  We'll contact you with your lab report.  Stop the peanuts.  Take the aleve (with food) and vicodin (sedation caution) as needed.  Let me know how your are doing next week.  We'll go from there.  Take care.

## 2012-08-04 DIAGNOSIS — M109 Gout, unspecified: Secondary | ICD-10-CM | POA: Insufficient documentation

## 2012-08-04 NOTE — Assessment & Plan Note (Signed)
Colchicine intolerant.  Use vicodon with opiate cautions.  Check urate today.  He'll notify me with update next week.  Consider recheck uric acid later when he has been off peanuts for extended period of time. He agrees.

## 2012-08-04 NOTE — Progress Notes (Signed)
L ankle, then R ankle, then L knee red and painful.  Painful to walk, flex the knee. Better, improving now. Had been eating more peanuts and he didn't know if this set off gout in the joints. Prev with rare flares.    Meds, vitals, and allergies reviewed.   ROS: See HPI.  Otherwise, noncontributory.  nad L knee puffy, pink, warm, and diffusely tender (actually much improved per patient), walking with limp.

## 2012-08-14 ENCOUNTER — Encounter: Payer: Self-pay | Admitting: Family Medicine

## 2012-08-26 ENCOUNTER — Other Ambulatory Visit: Payer: Self-pay | Admitting: Family Medicine

## 2012-08-27 ENCOUNTER — Other Ambulatory Visit: Payer: Self-pay | Admitting: *Deleted

## 2012-08-27 MED ORDER — HYDROCODONE-ACETAMINOPHEN 5-325 MG PO TABS: 1.0000 | ORAL_TABLET | Freq: Four times a day (QID) | ORAL | Status: DC | PRN

## 2012-08-27 NOTE — Telephone Encounter (Signed)
Patient requested refill through MyChart.

## 2012-08-27 NOTE — Telephone Encounter (Signed)
Please call in.  Thanks.   

## 2012-08-27 NOTE — Telephone Encounter (Signed)
Rx phoned to pharmacy.  

## 2012-09-23 ENCOUNTER — Telehealth: Payer: Self-pay | Admitting: Family Medicine

## 2012-09-23 ENCOUNTER — Encounter: Payer: Self-pay | Admitting: Family Medicine

## 2012-09-23 DIAGNOSIS — M109 Gout, unspecified: Secondary | ICD-10-CM

## 2012-09-23 NOTE — Telephone Encounter (Signed)
Call pt. I see two options.  Continuing as is (cherry juice daily) vs starting a low dose of allopurinol (with slow uptitration of dose).  If he would like to start the allopurinol, would start with 11m a day for 10 days, then take 2050ma day thereafter.  If he agrees, please call in allopurinol 10068mab, with the sig listed (start with 100m75mday for 10 days, then take 200mg49may thereafter), #60, 12 rf. Thanks. I would recheck a uric acid level in about 6 weeks if he starts the allopurinol. I put in the order.  Please have the lab cancel it if he isn't going to start the allopurinol. Thanks.

## 2012-09-24 NOTE — Telephone Encounter (Signed)
Pt said he will stick with the cherry juice since that seems to be helping and doesn't want to start any new meds. Terri ([employer] manager) canceled lab orders, pt said if he changes his mind or his symptoms return he will call back to request Rx be filled

## 2012-09-24 NOTE — Telephone Encounter (Signed)
Thanks

## 2013-03-04 ENCOUNTER — Other Ambulatory Visit: Payer: Self-pay

## 2013-03-24 ENCOUNTER — Telehealth: Payer: Self-pay

## 2013-03-24 MED ORDER — METOPROLOL SUCCINATE ER 25 MG PO TB24: 25.0000 mg | ORAL_TABLET | Freq: Every day | ORAL | Status: DC

## 2013-03-24 NOTE — Telephone Encounter (Signed)
done

## 2013-04-05 ENCOUNTER — Encounter: Payer: Self-pay | Admitting: Family Medicine

## 2013-05-18 ENCOUNTER — Telehealth: Payer: Self-pay | Admitting: *Deleted

## 2013-05-18 NOTE — Telephone Encounter (Signed)
Patient needs metoprolol refill to be sent to [facility] in [place]. The pharmacy loaned patient 2 tabs. Thanks, MI

## 2013-05-19 MED ORDER — METOPROLOL SUCCINATE ER 25 MG PO TB24: 25.0000 mg | ORAL_TABLET | Freq: Every day | ORAL | Status: DC

## 2013-05-19 NOTE — Telephone Encounter (Signed)
Done

## 2013-05-20 ENCOUNTER — Other Ambulatory Visit: Payer: Self-pay

## 2013-05-20 MED ORDER — METOPROLOL SUCCINATE ER 25 MG PO TB24: 25.0000 mg | ORAL_TABLET | Freq: Every day | ORAL | Status: DC

## 2013-05-28 ENCOUNTER — Other Ambulatory Visit: Payer: Self-pay

## 2013-05-28 MED ORDER — METOPROLOL SUCCINATE ER 25 MG PO TB24: 25.0000 mg | ORAL_TABLET | Freq: Every day | ORAL | Status: DC

## 2013-06-08 ENCOUNTER — Encounter: Payer: Self-pay | Admitting: Interventional Cardiology

## 2013-06-10 ENCOUNTER — Ambulatory Visit (INDEPENDENT_AMBULATORY_CARE_PROVIDER_SITE_OTHER): Payer: Medicare Other | Admitting: Interventional Cardiology

## 2013-06-10 ENCOUNTER — Encounter: Payer: Self-pay | Admitting: Interventional Cardiology

## 2013-06-10 VITALS — BP 136/72 | HR 66 | Ht 74.0 in | Wt 230.0 lb

## 2013-06-10 DIAGNOSIS — I517 Cardiomegaly: Secondary | ICD-10-CM

## 2013-06-10 DIAGNOSIS — I359 Nonrheumatic aortic valve disorder, unspecified: Secondary | ICD-10-CM

## 2013-06-10 DIAGNOSIS — I351 Nonrheumatic aortic (valve) insufficiency: Secondary | ICD-10-CM

## 2013-06-10 DIAGNOSIS — I447 Left bundle-branch block, unspecified: Secondary | ICD-10-CM

## 2013-06-10 DIAGNOSIS — I499 Cardiac arrhythmia, unspecified: Secondary | ICD-10-CM

## 2013-06-10 MED ORDER — METOPROLOL SUCCINATE ER 25 MG PO TB24: 25.0000 mg | ORAL_TABLET | Freq: Every day | ORAL | Status: DC

## 2013-06-10 NOTE — Progress Notes (Signed)
Patient ID: Richard Saunders, male   DOB: [birth year], 73 y.o.   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     [facility]. [address] [place], [place]  [address] Phone: [phone] Fax:  [phone]  Date:  06/10/2013   ID:  Richard Saunders, DOB Sep 19, 1940, MRN [id number]  PCP:  Elsie Stain, MD   ASSESSMENT:  1. 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 Left ventricular hypertrophy with left bundle branch block, unchanged  3. 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 No evidence of heart failure  PLAN:  1. Continue metoprolol succinate 25 mg daily 2. Cautioned to call if edema or dyspnea developed.   SUBJECTIVE: Richard Saunders is a 73 y.o. male who has no cardiac complaints. There is a known history of frequent PVCs treated with beta blocker therapy successfully. He has low normal LVEF of 50% with mild to moderate aortic and mitral regurgitation by echo in 2012. He is becoming less and less active. He denies orthopnea and PND. No chest pain. No peripheral edema. Overall he feels stable to   Wt Readings from Last 3 Encounters:  06/10/13 230 lb (104.327 kg)  08/03/12 236 lb 12 oz (107.389 kg)  07/27/12 237 lb 8 oz (107.729 kg)     Past Medical History  Diagnosis Date  . Hypertension   . Heart block bundle branch   . Depression   . Allergy   . History of chicken pox   . GERD (gastroesophageal reflux disease)   . Organic impotence   . H/O: rheumatic fever 2012    (per patient) - eval by [facility] LBBB with PVC's  . Anxiety     Current Outpatient Prescriptions  Medication Sig Dispense Refill  . cetirizine (ZYRTEC) 10 MG tablet Take 10 mg by mouth daily.      Marland Kitchen guaiFENesin (MUCINEX) 600 MG 12 hr tablet Take 1,200 mg  by mouth daily as needed.      . metoprolol succinate (TOPROL-XL) 25 MG 24 hr tablet Take 1 tablet (25 mg total) by mouth daily.  15 tablet  0  . mometasone (NASONEX) 50 MCG/ACT nasal spray 1-2 sprays per nostril per day.  17 g  12   No current facility-administered medications for this visit.    Allergies:    Allergies  Allergen Reactions  . Colchicine     diarrhea  . Fluticasone Propionate     He didn't tolerate the taste/smell prev    Social History:  The patient  reports that he has quit smoking. He does not have any smokeless tobacco history on file. He reports that he drinks alcohol. He reports that he does not use illicit drugs.   ROS:  Please see the history of present illness.   Stable without respiratory problems other than hay fever.   All other systems reviewed and negative.   OBJECTIVE: VS:  BP 136/72  Pulse 66  Ht 6' 2"  (1.88 m)  Wt 230 lb (104.327 kg)  BMI 29.52 kg/m2 Well nourished, well developed, in no acute distress, elderly and overweight HEENT: normal Neck: JVD flat. Carotid bruit absent  Cardiac:  normal S1, S2; RRR; 2/6 systolic murmur right upper sternal border. 1/6 decrescendo murmur of aortic regurgitation Lungs:  clear to auscultation bilaterally, no wheezing, rhonchi  or rales Abd: soft, nontender, no hepatomegaly Ext: Edema absent. Pulses 2+ Skin: warm and dry Neuro:  CNs 2-12 intact, no focal abnormalities noted  EKG:  Normal sinus rhythm, first-degree AV block, left bundle branch block. Unchanged from prior tracings.       Signed, Illene Labrador III, MD 06/10/2013 1:27 PM

## 2013-06-10 NOTE — Patient Instructions (Signed)
Your physician wants you to follow-up in: 1 year with Dr. Gaspar Bidding will receive a reminder letter in the mail two months in advance. If you don't receive a letter, please call our office to schedule the follow-up appointment.  Your physician recommends that you continue on your current medications as directed. Please refer to the Current Medication list given to you today.

## 2013-09-13 ENCOUNTER — Encounter: Payer: Self-pay | Admitting: Family Medicine

## 2013-09-13 ENCOUNTER — Ambulatory Visit (INDEPENDENT_AMBULATORY_CARE_PROVIDER_SITE_OTHER): Payer: Medicare Other | Admitting: Family Medicine

## 2013-09-13 VITALS — BP 132/82 | HR 75 | Temp 97.7°F | Wt 228.0 lb

## 2013-09-13 DIAGNOSIS — R197 Diarrhea, unspecified: Secondary | ICD-10-CM

## 2013-09-13 NOTE — Patient Instructions (Signed)
Bland diet, plenty of fluids (enough to keep your urine light colored), and avoid dairy for now.   Schedule a physical in the meantime.

## 2013-09-13 NOTE — Progress Notes (Signed)
Pre visit review using our clinic review tool, if applicable. No additional management support is needed unless otherwise documented below in the visit note.  He had seasonal allergies at baseline.  Restarted mucinex and nasonex.  It made it tolerable, "less bad".  Then over the last week had diarrhea.  Initially w/o any warning sx.  No abd pain, no fevers.  Nonbloody, "pure liquid."  This was atypical for him.  He had BMs every 2-3 hours,4-5 times a day.  Continued through yesterday.  Minimal abd discomfort before the BMs in the last few weeks.  He felt weak and lightheaded and had some lower back and hip aches B.  This AM he felt some better.  Loose BM today but improved.  Energy is improved today.  He has tried to replace fluids with gatorade. Urine usually clear.    No fevers.  No travel.  No atypical foods.  No other sick contacts.    Meds, vitals, and allergies reviewed.   ROS: See HPI.  Otherwise, noncontributory.  GEN: nad, alert and oriented HEENT: mucous membranes moist NECK: supple w/o LA CV: rrr PULM: ctab, no inc wob ABD: soft, +bs EXT: no edema SKIN: no acute rash

## 2013-09-14 DIAGNOSIS — R197 Diarrhea, unspecified: Secondary | ICD-10-CM | POA: Insufficient documentation

## 2013-09-14 NOTE — Assessment & Plan Note (Signed)
Likely related to his prev lower back sx.  Now improving.  Unlikely to be diverticulitis.  Wouldn't intervene at this point given his improvement. He agrees. D/w pt about routine fluid replacement. F/u prn.

## 2013-11-11 ENCOUNTER — Encounter: Payer: Medicare Other | Admitting: Family Medicine

## 2014-02-11 ENCOUNTER — Other Ambulatory Visit: Payer: Self-pay

## 2014-06-15 ENCOUNTER — Encounter: Payer: Self-pay | Admitting: Interventional Cardiology

## 2014-06-15 ENCOUNTER — Ambulatory Visit (INDEPENDENT_AMBULATORY_CARE_PROVIDER_SITE_OTHER): Payer: Medicare Other | Admitting: Interventional Cardiology

## 2014-06-15 VITALS — BP 127/78 | HR 59 | Ht 74.0 in | Wt 239.0 lb

## 2014-06-15 DIAGNOSIS — I517 Cardiomegaly: Secondary | ICD-10-CM

## 2014-06-15 DIAGNOSIS — I447 Left bundle-branch block, unspecified: Secondary | ICD-10-CM

## 2014-06-15 DIAGNOSIS — I351 Nonrheumatic aortic (valve) insufficiency: Secondary | ICD-10-CM

## 2014-06-15 DIAGNOSIS — I493 Ventricular premature depolarization: Secondary | ICD-10-CM

## 2014-06-15 NOTE — Patient Instructions (Signed)
Your physician recommends that you continue on your current medications as directed. Please refer to the Current Medication list given to you today.  Your physician discussed the importance of regular exercise and recommended that you start or continue a regular exercise program for good health.  Your physician wants you to follow-up in: 1 year with Dr.Smith You will receive a reminder letter in the mail two months in advance. If you don't receive a letter, please call our office to schedule the follow-up appointment.

## 2014-06-15 NOTE — Progress Notes (Signed)
Cardiology Office Note   Date:  06/15/2014   ID:  Richard Saunders, DOB [birth year], MRN [id number]  PCP:  Elsie Stain, MD  Cardiologist:   Sinclair Grooms, MD   Chief Complaint  Patient presents with  . 1 YR VISIT    EKG PERFORMED      History of Present Illness: Richard Saunders is a 74 y.o. male who presents for premature ventricular contractions and known left ventricular hypertrophy. History of widely patent coronaries many years ago. Occasional left arm tremor and weakness. He denies orthopnea, PND, and claudication. Able to lie flat without dyspnea.   Past Medical History  Diagnosis Date  . Hypertension   . Heart block bundle branch   . Depression   . Allergy   . History of chicken pox   . GERD (gastroesophageal reflux disease)   . Organic impotence   . H/O: rheumatic fever 2012    (per patient) - eval by [facility] LBBB with PVC's  . Anxiety     Past Surgical History  Procedure Laterality Date  . Tonsillectomy and adenoidectomy  1947  . Right elbow bursa removed    . Cautery for nosebleeds       Current Outpatient Prescriptions  Medication Sig Dispense Refill  . cetirizine (ZYRTEC) 10 MG tablet Take 10 mg by mouth daily as needed.     . metoprolol succinate (TOPROL-XL) 25 MG 24 hr tablet Take 1 tablet (25 mg total) by mouth daily. 90 tablet 3  . guaiFENesin (MUCINEX) 600 MG 12 hr tablet Take 1,200 mg by mouth daily as needed.    . mometasone (NASONEX) 50 MCG/ACT nasal spray 1-2 sprays per nostril per day as needed.     No current facility-administered medications for this visit.    Allergies:   Colchicine and Fluticasone propionate    Social History:  The patient  reports that he has quit smoking. He does not have any smokeless tobacco history on file. He reports that he drinks alcohol. He reports that he does not use illicit drugs.   Family History:  The patient's family history includes Aneurysm in his mother; COPD in his father; Cancer in  his father and mother; Diabetes in his other; Heart disease in his other; Hypertension in his other.    ROS:  Please see the history of present illness.   Otherwise, review of systems are positive for none.   All other systems are reviewed and negative.    PHYSICAL EXAM: VS:  BP 127/78 mmHg  Pulse 59  Ht 6' 2"  (1.88 m)  Wt 239 lb (108.41 kg)  BMI 30.67 kg/m2 , BMI Body mass index is 30.67 kg/(m^2). GEN: Well nourished, well developed, in no acute distress HEENT: normal Neck: no JVD, carotid bruits, or masses Cardiac: RRR; no murmurs, rubs, or gallops,no edema  Respiratory:  clear to auscultation bilaterally, normal work of breathing GI: soft, nontender, nondistended, + BS MS: no deformity or atrophy Skin: warm and dry, no rash Neuro:  Strength and sensation are intact Psych: euthymic mood, full affect   EKG:  EKG is ordered today. The ekg ordered today demonstrates sinus bradycardia, prominent voltage, and NSSTTWA. LBBB has resolved.  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  Lipid Panel  Component Value Date/Time   CHOL 140 06/11/2010 1300   TRIG 45.0 06/11/2010 1300   HDL 49.20 06/11/2010 1300   CHOLHDL 3 06/11/2010 1300   VLDL 9.0 06/11/2010 1300   LDLCALC 82 06/11/2010 1300      Wt Readings from Last 3 Encounters:  06/15/14 239 lb (108.41 kg)  09/13/13 228 lb (103.42 kg)  06/10/13 230 lb (104.327 kg)      Other studies Reviewed: Additional studies/ records that were reviewed today include: .   ASSESSMENT AND PLAN:  1.  Left bundle branch block has resolved on today's EKG 2. Hypertensive heart disease with LVH but no symptoms of heart failure other complaints. Echocardiogram in 2012 revealed an EF of 50% 3. Mild to  moderate aortic and mitral regurgitation by echo 0349, systolic murmur heard at left lower sternal border compatible with mitral regurg. No aortic regurgitation is heard. 4. History of frequent PVCs, none observed either by auscultation or EKG on today's exam   Current medicines are reviewed at length with the patient today.  The patient does not have concerns regarding medicines.  The following changes have been made:  no change  Labs/ tests ordered today include:  No orders of the defined types were placed in this encounter.     Disposition:   FU with Linard Millers in 1 One year   Signed, Sinclair Grooms, MD  06/15/2014 4:23 PM    [facility] [place], [place], [place]  [address] Phone: [phone]; Fax: [phone]

## 2014-07-06 ENCOUNTER — Other Ambulatory Visit: Payer: Self-pay | Admitting: Interventional Cardiology

## 2014-10-24 ENCOUNTER — Other Ambulatory Visit: Payer: Self-pay

## 2014-11-30 ENCOUNTER — Ambulatory Visit (INDEPENDENT_AMBULATORY_CARE_PROVIDER_SITE_OTHER): Payer: Medicare Other | Admitting: Family Medicine

## 2014-11-30 ENCOUNTER — Encounter: Payer: Self-pay | Admitting: Family Medicine

## 2014-11-30 VITALS — BP 126/76 | HR 56 | Temp 98.6°F | Wt 242.2 lb

## 2014-11-30 DIAGNOSIS — L989 Disorder of the skin and subcutaneous tissue, unspecified: Secondary | ICD-10-CM | POA: Diagnosis not present

## 2014-11-30 DIAGNOSIS — R21 Rash and other nonspecific skin eruption: Secondary | ICD-10-CM | POA: Insufficient documentation

## 2014-11-30 DIAGNOSIS — F341 Dysthymic disorder: Secondary | ICD-10-CM | POA: Diagnosis not present

## 2014-11-30 MED ORDER — ALPRAZOLAM 0.25 MG PO TABS: 0.1250 mg | ORAL_TABLET | Freq: Every day | ORAL | Status: DC | PRN

## 2014-11-30 NOTE — Patient Instructions (Signed)
Keep it clean and covered. Use a bandaid and neosporin.   Wash gently with soapy water.  You shouldn't have any bleeding.  If you do, then put pressure on the area for a few minutes.  Take care.  Glad to see you. Schedule a physical when possible.

## 2014-11-30 NOTE — Progress Notes (Signed)
Pre visit review using our clinic review tool, if applicable. No additional management support is needed unless otherwise documented below in the visit note.  Facial lesion.  Present for a few months.  Came up quickly.  Not changing much now.  Can get irritated.  Not painful.    Rare use of xanax.  For anxiety.  Refill needed, done.  No ADE on med.   Lesion removal:  Meds, vitals, and allergies reviewed.   Indication: irritated lesion, likely skin tag.  No ulceration or concern for skin CA  Location: ~80m  Size: R face  Informed consent obtained.  Pt aware of risks not limited to but including infection, bleeding, damage to near by organs.  Prep: etoh/betadine  Anesthesia: 1%lidocaine with epi, good effect  Lesion snipped off with scissors.   Minimal oozing, controlled with silver nitrate  Tolerated well, no complications.

## 2014-11-30 NOTE — Assessment & Plan Note (Signed)
Likely warty lesion vs irritated skin tag.  No need for path review given the zero suspicion for skin cancer.   Routine postprocedure instructions d/w pt- keep area clean and bandaged, follow up if concerns/spreading erythema/pain.

## 2014-11-30 NOTE — Assessment & Plan Note (Signed)
Rare BZD use, refilled.  Asked patient to schedule CPE so we can go over chronic health concerns, prevention.

## 2014-12-30 ENCOUNTER — Ambulatory Visit (INDEPENDENT_AMBULATORY_CARE_PROVIDER_SITE_OTHER): Payer: Medicare Other | Admitting: Family Medicine

## 2014-12-30 ENCOUNTER — Encounter: Payer: Self-pay | Admitting: Family Medicine

## 2014-12-30 VITALS — BP 118/70 | HR 60 | Temp 98.2°F | Wt 238.0 lb

## 2014-12-30 DIAGNOSIS — B370 Candidal stomatitis: Secondary | ICD-10-CM

## 2014-12-30 MED ORDER — NYSTATIN 100000 UNIT/ML MT SUSP: 5.0000 mL | Freq: Four times a day (QID) | OROMUCOSAL | Status: DC

## 2014-12-30 NOTE — Patient Instructions (Signed)
Use the nystatin 4 times a day and this should get better.   Take care.  Glad to see you.

## 2014-12-30 NOTE — Progress Notes (Signed)
Pre visit review using our clinic review tool, if applicable. No additional management support is needed unless otherwise documented below in the visit note.  The skin lesion on the R side of the face has resolved.  Skin is smooth and w/o lesion now.   Oral complaint.  Was intermittent.  He has some whitish changes w/o pain on the R side of the tongue (he didn't know his this regional change was from being a side sleeper with dependent changes from dry mouth).  He has some intermittent taste changes.  He didn't know if all of this was related to dry mouth. He didn't know if mucinex was contributing.  He stopped mucinex yesterday but now has some cough this AM.  He has some dry mouth mouthwash and that is helping with the dryness.  Positional mouth breather at night.  No recent abx.  No FCNAV.    Meds, vitals, and allergies reviewed.   ROS: See HPI.  Otherwise, noncontributory.  nad ncat Nasal exam wnl MMM, OP okay but tongue with R > L, upper > lower tongue whitish changes. No ulceration Neck supple. No LA

## 2015-01-01 DIAGNOSIS — B370 Candidal stomatitis: Secondary | ICD-10-CM | POA: Insufficient documentation

## 2015-01-02 NOTE — Assessment & Plan Note (Signed)
Likely, d/w pt.  Dry mouth and positional sleep (with mouth breathing) may contribute.  Use the nystatin 4 times a day and this should get better.  F/u prn.  He agrees.

## 2015-07-03 ENCOUNTER — Encounter: Payer: Self-pay | Admitting: Interventional Cardiology

## 2015-07-03 ENCOUNTER — Ambulatory Visit (INDEPENDENT_AMBULATORY_CARE_PROVIDER_SITE_OTHER): Payer: Medicare Other | Admitting: Interventional Cardiology

## 2015-07-03 VITALS — BP 136/84 | HR 51 | Ht 75.0 in | Wt 238.0 lb

## 2015-07-03 DIAGNOSIS — I493 Ventricular premature depolarization: Secondary | ICD-10-CM

## 2015-07-03 DIAGNOSIS — I447 Left bundle-branch block, unspecified: Secondary | ICD-10-CM

## 2015-07-03 DIAGNOSIS — I351 Nonrheumatic aortic (valve) insufficiency: Secondary | ICD-10-CM | POA: Diagnosis not present

## 2015-07-03 DIAGNOSIS — I517 Cardiomegaly: Secondary | ICD-10-CM | POA: Diagnosis not present

## 2015-07-03 MED ORDER — ASPIRIN EC 81 MG PO TBEC: 81.0000 mg | DELAYED_RELEASE_TABLET | Freq: Every day | ORAL | Status: DC

## 2015-07-03 MED ORDER — METOPROLOL SUCCINATE ER 25 MG PO TB24: 12.5000 mg | ORAL_TABLET | Freq: Every day | ORAL | Status: DC

## 2015-07-03 NOTE — Patient Instructions (Signed)
Medication Instructions:  Your physician has recommended you make the following change in your medication:  1) REDUCE Metoprolol to 12.65m daily 2) START Aspirin 870mdaily   Labwork: None ordered  Testing/Procedures: None ordered  Follow-Up: Your physician wants you to follow-up in: 1 year with Dr.Smith You will receive a reminder letter in the mail two months in advance. If you don't receive a letter, please call our office to schedule the follow-up appointment.   Any Other Special Instructions Will Be Listed Below (If Applicable).     If you need a refill on your cardiac medications before your next appointment, please call your pharmacy.

## 2015-07-03 NOTE — Progress Notes (Signed)
Cardiology Office Note   Date:  07/03/2015   ID:  Richard Saunders, DOB [birth year], MRN [id number]  PCP:  Elsie Stain, MD  Cardiologist:  Sinclair Grooms, MD   Chief Complaint  Patient presents with  . Palpitations      History of Present Illness: Richard Saunders is a 75 y.o. male who presents for aortic valve disease, left bundle branch block, premature ventricular contractions, hypertension, and conduction system disease.  Complains of exertional fatigue and dyspnea with moderate to heavy physical activity. Otherwise unremarkable. Last orthopnea, PND, chest pain, syncope, near syncope, and edema.    Past Medical History  Diagnosis Date  . Hypertension   . Heart block bundle branch   . Depression   . Allergy   . History of chicken pox   . GERD (gastroesophageal reflux disease)   . Organic impotence   . H/O: rheumatic fever 2012    (per patient) - eval by [facility] with PVC's  . Anxiety     Past Surgical History  Procedure Laterality Date  . Tonsillectomy and adenoidectomy  1947  . Right elbow bursa removed    . Cautery for nosebleeds       Current Outpatient Prescriptions  Medication Sig Dispense Refill  . ALPRAZolam (XANAX) 0.25 MG tablet Take 0.5 tablets (0.125 mg total) by mouth daily as needed for anxiety. 30 tablet 0  . cromolyn (OPTICROM) 4 % ophthalmic solution Place 1 drop into both eyes daily.  2  . guaiFENesin (MUCINEX) 600 MG 12 hr tablet Take 1,200 mg by mouth daily as needed for cough or to loosen phlegm.     . metoprolol succinate (TOPROL-XL) 25 MG 24 hr tablet Take 0.5 tablets (12.5 mg total) by mouth daily.    . mometasone (NASONEX) 50 MCG/ACT nasal spray 1-2 sprays per nostril per day as needed for congestion.    . Omega-3 Fatty Acids (FISH OIL PO) Take 1 capsule by mouth daily.    Marland Kitchen aspirin EC 81 MG tablet Take 1 tablet (81 mg total) by mouth daily.     No current facility-administered medications for this visit.     Allergies:   Colchicine and Fluticasone propionate    Social History:  The patient  reports that he has quit smoking. He has never used smokeless tobacco. He reports that he drinks alcohol. He reports that he does not use illicit drugs.   Family History:  The patient's family history includes Aneurysm in his mother; COPD in his father; Cancer in his father and mother; Diabetes in his other; Heart disease in his other; Hypertension in his other.    ROS:  Please see the history of present illness.   Otherwise, review of systems are positive for back discomfort, depression, difficulty with balance, and shortness of breath as mentioned above..   All other systems are reviewed and negative.    PHYSICAL EXAM: VS:  BP 136/84 mmHg  Pulse 51  Ht 6' 3"  (1.905 m)  Wt 238 lb (107.956 kg)  BMI 29.75 kg/m2 , BMI Body mass index is 29.75 kg/(m^2). GEN: Well nourished, well developed, in no acute distress HEENT: normal Neck: no JVD, carotid bruits, or masses Cardiac: RRR.  There is a 2/6 systolic murmur with a 1 to 2/6 diastolic murmur at the left lower sternal border. There is no rub, or gallop. There is none edema. Respiratory:  clear to auscultation bilaterally, normal work of breathing. GI: soft, nontender, nondistended, + BS MS:  no deformity or atrophy Skin: warm and dry, no rash Neuro:  Strength and sensation are intact Psych: euthymic mood, full affect   EKG:  EKG is ordered today. The ekg reveals sinus bradycardia, PR interval 304 ms, otherwise unremarkable. Compared to 2 years ago, left bundle branch block has resolved. Heart rate at that time was 66 bpm.   Recent Labs: No results found for requested labs within last 365 days.    Lipid Panel    Component Value Date/Time   CHOL 140 06/11/2010 1300   TRIG 45.0 06/11/2010 1300   HDL 49.20 06/11/2010 1300   CHOLHDL 3 06/11/2010 1300   VLDL 9.0 06/11/2010 1300   LDLCALC 82 06/11/2010 1300      Wt Readings from Last 3  Encounters:  07/03/15 238 lb (107.956 kg)  12/30/14 238 lb (107.956 kg)  11/30/14 242 lb 4 oz (109.884 kg)      Other studies Reviewed: Additional studies/ records that were reviewed today include: Reviewed all echo.. The findings include no new data of significance..    ASSESSMENT AND PLAN:  1. Aortic regurgitation There is now a systolic component to the murmur. May be developing calcific aortic valve disease. May also have something to do with his conduction system disease.  2. Left bundle branch block With first-degree AV block Left bundle has resolved. He now has sinus bradycardia with a long first-degree AV block, PR 304 ms.  3. Premature ventricular contractions None  4. Left ventricular hypertrophy Not recently assessed  5. 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  The patient has the following concerns regarding medicines: None .  The following changes/actions have been instituted:    Aspirin 81 mg per day  Decrease to Metoprolol succinate 12.5 mg daily  Will eventually need to discontinue metoprolol because of intrinsic conduction system disease.  Labs/ tests ordered today include:   Orders Placed This Encounter  Procedures  . EKG 12-Lead     Disposition:   FU with HS in 1 year  Signed, Sinclair Grooms, MD  07/03/2015 1:14 PM    [facility] [place], [place], [place]  [address] Phone: [phone]; Fax: [phone]

## 2015-07-08 ENCOUNTER — Other Ambulatory Visit: Payer: Self-pay | Admitting: Interventional Cardiology

## 2015-07-10 NOTE — Telephone Encounter (Signed)
The following changes/actions have been instituted:   Aspirin 81 mg per day  Decrease to Metoprolol succinate 12.5 mg daily  Will eventually need to discontinue metoprolol because of intrinsic conduction system disease.  Labs/ tests ordered today include:  Orders Placed This Encounter  Procedures  . EKG 12-Lead     Disposition: FU with HS in 1 year  Signed, Sinclair Grooms, MD  07/03/2015 1:14 PM  [facility] [place], [place], [place] [address] Phone: [phone]; Fax: [phone]

## 2016-06-04 ENCOUNTER — Ambulatory Visit (INDEPENDENT_AMBULATORY_CARE_PROVIDER_SITE_OTHER): Payer: Medicare HMO | Admitting: Family Medicine

## 2016-06-04 ENCOUNTER — Encounter: Payer: Self-pay | Admitting: Family Medicine

## 2016-06-04 DIAGNOSIS — K5900 Constipation, unspecified: Secondary | ICD-10-CM | POA: Diagnosis not present

## 2016-06-04 MED ORDER — POLYETHYLENE GLYCOL 3350 17 GM/SCOOP PO POWD: 17.0000 g | Freq: Two times a day (BID) | ORAL | Status: DC | PRN

## 2016-06-04 NOTE — Assessment & Plan Note (Signed)
W/o obstruction, with concurrent episodica R lower back pain but no CVA pain and no emergent sx.  No dysuria.  Would treat constipation and have him update me if all sx are not better.  No emergent issues today, ie no imaging or labs needed now.  He agrees.   See AVS re: miralax.  May need daily dosing for chronic sx in the future.

## 2016-06-04 NOTE — Progress Notes (Signed)
Pre visit review using our clinic review tool, if applicable. No additional management support is needed unless otherwise documented below in the visit note. 

## 2016-06-04 NOTE — Progress Notes (Signed)
Sx started about 5 days ago.  Initially with dull but severe in the R lower back near the belt line.  Would get nausea and dry heaves w/o vomiting when he had pain.  Pain got better in the meantime, then would episodically return.  No fevers, no cough.  Overall he is some better in the meantime, fewer and less severe episodes, but then had another episode this AM.    He had a few days w/o BM and tried a stool softener.  H/o constipation at baseline, that is not uncommon for patient.  He tried using an enema in the meantime.  Still with flatus.    No falls, no trauma. No rash.  No dysuria.  No black stools.  He usually has back tightness in the AM, has to stretch each AM at baseline.  Pain didn't radiate down the leg.  No L sided pain.    Meds, vitals, and allergies reviewed.   ROS: Per HPI unless specifically indicated in ROS section   nad ncat Mmm Neck supple, no LA rrr ctab abd soft, not ttp, normal BS, no rebound. Back w/o midline or cva pain.  R lower back isn't ttp  Normal R hip ROM, SLR neg, normal hip int/ext rotation.  No jaundice

## 2016-06-04 NOTE — Patient Instructions (Signed)
Take miralax up to twice a day until you have results.   Then continue daily if needed.  Update me as needed.

## 2016-07-17 ENCOUNTER — Other Ambulatory Visit: Payer: Self-pay | Admitting: Interventional Cardiology

## 2016-07-29 ENCOUNTER — Ambulatory Visit (INDEPENDENT_AMBULATORY_CARE_PROVIDER_SITE_OTHER): Payer: Medicare HMO

## 2016-07-29 VITALS — BP 110/80 | HR 55 | Temp 97.8°F | Ht 72.0 in | Wt 235.5 lb

## 2016-07-29 DIAGNOSIS — R739 Hyperglycemia, unspecified: Secondary | ICD-10-CM | POA: Diagnosis not present

## 2016-07-29 DIAGNOSIS — Z0000 Encounter for general adult medical examination without abnormal findings: Secondary | ICD-10-CM | POA: Diagnosis not present

## 2016-07-29 DIAGNOSIS — Z8739 Personal history of other diseases of the musculoskeletal system and connective tissue: Secondary | ICD-10-CM | POA: Diagnosis not present

## 2016-07-29 DIAGNOSIS — N529 Male erectile dysfunction, unspecified: Secondary | ICD-10-CM | POA: Diagnosis not present

## 2016-07-29 DIAGNOSIS — Z130 Encounter for screening for diseases of the blood and blood-forming organs and certain disorders involving the immune mechanism: Secondary | ICD-10-CM | POA: Diagnosis not present

## 2016-07-29 DIAGNOSIS — Z125 Encounter for screening for malignant neoplasm of prostate: Secondary | ICD-10-CM

## 2016-07-29 DIAGNOSIS — R799 Abnormal finding of blood chemistry, unspecified: Secondary | ICD-10-CM

## 2016-07-29 DIAGNOSIS — Z13220 Encounter for screening for lipoid disorders: Secondary | ICD-10-CM | POA: Diagnosis not present

## 2016-07-29 DIAGNOSIS — Z23 Encounter for immunization: Secondary | ICD-10-CM | POA: Diagnosis not present

## 2016-07-29 DIAGNOSIS — M10071 Idiopathic gout, right ankle and foot: Secondary | ICD-10-CM

## 2016-07-29 LAB — PSA, MEDICARE: PSA: 0.27 ng/mL (ref 0.10–4.00)

## 2016-07-29 LAB — CBC WITH DIFFERENTIAL/PLATELET
Basophils Absolute: 0 10*3/uL (ref 0.0–0.1)
Basophils Relative: 0.4 % (ref 0.0–3.0)
EOS PCT: 2.6 % (ref 0.0–5.0)
Eosinophils Absolute: 0.1 10*3/uL (ref 0.0–0.7)
HEMATOCRIT: 44 % (ref 39.0–52.0)
HEMOGLOBIN: 15 g/dL (ref 13.0–17.0)
LYMPHS ABS: 1.5 10*3/uL (ref 0.7–4.0)
Lymphocytes Relative: 32.3 % (ref 12.0–46.0)
MCHC: 34 g/dL (ref 30.0–36.0)
MCV: 100.5 fl — AB (ref 78.0–100.0)
MONOS PCT: 7.1 % (ref 3.0–12.0)
Monocytes Absolute: 0.3 10*3/uL (ref 0.1–1.0)
NEUTROS ABS: 2.7 10*3/uL (ref 1.4–7.7)
Neutrophils Relative %: 57.6 % (ref 43.0–77.0)
Platelets: 115 10*3/uL — ABNORMAL LOW (ref 150.0–400.0)
RBC: 4.37 Mil/uL (ref 4.22–5.81)
RDW: 14.4 % (ref 11.5–15.5)
WBC: 4.7 10*3/uL (ref 4.0–10.5)

## 2016-07-29 LAB — COMPREHENSIVE METABOLIC PANEL
ALK PHOS: 49 U/L (ref 39–117)
ALT: 22 U/L (ref 0–53)
AST: 33 U/L (ref 0–37)
Albumin: 3.9 g/dL (ref 3.5–5.2)
BILIRUBIN TOTAL: 1.5 mg/dL — AB (ref 0.2–1.2)
BUN: 23 mg/dL (ref 6–23)
CALCIUM: 9.6 mg/dL (ref 8.4–10.5)
CO2: 31 meq/L (ref 19–32)
Chloride: 108 mEq/L (ref 96–112)
Creatinine, Ser: 0.9 mg/dL (ref 0.40–1.50)
GFR: 87.35 mL/min (ref >60.00)
GLUCOSE: 99 mg/dL (ref 70–99)
POTASSIUM: 4.3 meq/L (ref 3.5–5.1)
Sodium: 144 mEq/L (ref 135–145)
Total Protein: 6.7 g/dL (ref 6.0–8.3)

## 2016-07-29 LAB — LIPID PANEL
CHOL/HDL RATIO: 2
Cholesterol: 167 mg/dL (ref 0–200)
HDL: 71.2 mg/dL (ref >39.00)
LDL Cholesterol: 82 mg/dL (ref 0–99)
NONHDL: 95.31
TRIGLYCERIDES: 68 mg/dL (ref 0.0–149.0)
VLDL: 13.6 mg/dL (ref 0.0–40.0)

## 2016-07-29 LAB — URIC ACID: URIC ACID, SERUM: 5.4 mg/dL (ref 4.0–7.8)

## 2016-07-29 LAB — TESTOSTERONE: Testosterone: 322.3 ng/dL (ref 300.00–890.00)

## 2016-07-29 LAB — HEMOGLOBIN A1C: Hgb A1c MFr Bld: 5.2 % (ref 4.6–6.5)

## 2016-07-29 NOTE — Progress Notes (Signed)
PCP notes:   Health maintenance:  PCV13 - administered  Abnormal screenings:   Depression score: 5 (pt denies suicidal ideation at this time)  Mini-Cog score: 19/20  Patient concerns:   Pt requested to have testosterone checked with labs today. Order generated.   Nurse concerns:  None  Next PCP appt:   08/06/16 @ 1045  I reviewed health advisor's note, was available for consultation on the day of service listed in this note, and agree with documentation and plan. Elsie Stain, MD.

## 2016-07-29 NOTE — Progress Notes (Signed)
Subjective:   Richard Saunders is a 76 y.o. male who presents for an Initial Medicare Annual Wellness Visit.  Review of Systems  N/A Cardiac Risk Factors include: advanced age (>57mn, >>25women);sedentary lifestyle;male gender;obesity (BMI >30kg/m2)    Objective:    Today's Vitals   07/29/16 0830  BP: 110/80  Pulse: (!) 55  Temp: 97.8 F (36.6 C)  TempSrc: Oral  SpO2: 95%  Weight: 235 lb 8 oz (106.8 kg)  Height: 6' (1.829 m)  PainSc: 0-No pain   Body mass index is 31.94 kg/m.  Current Medications (verified) Outpatient Encounter Prescriptions as of 07/29/2016  Medication Sig  . ALPRAZolam (XANAX) 0.25 MG tablet Take 0.5 tablets (0.125 mg total) by mouth daily as needed for anxiety.  .Marland Kitchenaspirin EC 81 MG tablet Take 1 tablet (81 mg total) by mouth daily.  . cromolyn (OPTICROM) 4 % ophthalmic solution Place 1 drop into both eyes daily.  .Marland KitchenguaiFENesin (MUCINEX) 600 MG 12 hr tablet Take 1,200 mg by mouth daily as needed for cough or to loosen phlegm.   . metoprolol succinate (TOPROL-XL) 25 MG 24 hr tablet TAKE 1/2 TABLET (12.5 MG) BY MOUTH DAILY  . mometasone (NASONEX) 50 MCG/ACT nasal spray 1-2 sprays per nostril per day as needed for congestion.  . Omega-3 Fatty Acids (FISH OIL PO) Take 1 capsule by mouth daily.  . polyethylene glycol powder (GLYCOLAX/MIRALAX) powder Take 17 g by mouth 2 (two) times daily as needed.  . Probiotic Product (PROBIOTIC PO) Take 1 capsule by mouth daily.   No facility-administered encounter medications on file as of 07/29/2016.     Allergies (verified) Colchicine and Fluticasone propionate   History: Past Medical History:  Diagnosis Date  . Allergy   . Anxiety   . Depression   . GERD (gastroesophageal reflux disease)   . H/O: rheumatic fever 2012   (per patient) - eval by [facility] with PVC's  . Heart block bundle branch   . History of chicken pox   . Hypertension   . Organic impotence    Past Surgical History:  Procedure  Laterality Date  . Cautery for nosebleeds    . Right elbow bursa removed    . TONSILLECTOMY AND ADENOIDECTOMY  1947   Family History  Problem Relation Age of Onset  . Cancer Mother     ovarian, uterine  . Aneurysm Mother     AAA in 158 . Cancer Father     Prostate  . COPD Father   . Diabetes Other   . Heart disease Other     CAD <60 male  . Hypertension Other    Social History   Occupational History  . Retired SEnvironmental education officerRetired   Social History Main Topics  . Smoking status: Former SResearch scientist (life sciences) . Smokeless tobacco: Never Used  . Alcohol use 0.0 oz/week     Comment: Rare  . Drug use: No  . Sexual activity: Not on file   Tobacco Counseling Counseling given: No   Activities of Daily Living In your present state of health, do you have any difficulty performing the following activities: 07/29/2016  Hearing? Y  Vision? N  Difficulty concentrating or making decisions? N  Walking or climbing stairs? Y  Dressing or bathing? N  Doing errands, shopping? N  Preparing Food and eating ? N  Using the Toilet? N  In the past six months, have you accidently leaked urine? N  Do you have problems with loss of  bowel control? N  Managing your Medications? N  Managing your Finances? N  Housekeeping or managing your Housekeeping? N  Some recent data might be hidden    Immunizations and Health Maintenance Immunization History  Administered Date(s) Administered  . Pneumococcal Conjugate-13 07/29/2016  . Pneumococcal Polysaccharide-23 06/07/2010  . Td 11/25/2006   There are no preventive care reminders to display for this patient.  Patient Care Team: Tonia Ghent, MD as PCP - General (Family Medicine) Belva Crome, MD as Consulting Physician (Cardiology) Clent Jacks, MD as Consulting Physician (Ophthalmology)    Assessment:   This is a routine wellness examination for Richard Saunders.   Hearing/Vision screen Hearing Screening Comments: Pt has hearing aids but has chosen  not to wear them Vision Screening Comments: Last vision exam in 2017 with Dr. Katy Fitch  Dietary issues and exercise activities discussed: Current Exercise Habits: The patient does not participate in regular exercise at present, Exercise limited by: None identified  Goals    . Increase physical activity          When schedule permits, I will start exercising at least 15 min 3 days per week.       Depression Screen PHQ 2/9 Scores 07/29/2016  PHQ - 2 Score 2  PHQ- 9 Score 5    Fall Risk Fall Risk  07/29/2016  Falls in the past year? No    Cognitive Function: MMSE - Mini Mental State Exam 07/29/2016  Orientation to time 5  Orientation to Place 5  Registration 3  Attention/ Calculation 0  Recall 2  Recall-comments pt was unable to recall 1 of 3 words  Language- name 2 objects 0  Language- repeat 1  Language- follow 3 step command 3  Language- read & follow direction 0  Write a sentence 0  Copy design 0  Total score 19       PLEASE NOTE: A Mini-Cog screen was completed. Maximum score is 20. A value of 0 denotes this part of Folstein MMSE was not completed or the patient failed this part of the Mini-Cog screening.   Mini-Cog Screening Orientation to Time - Max 5 pts Orientation to Place - Max 5 pts Registration - Max 3 pts Recall - Max 3 pts Language Repeat - Max 1 pts Language Follow 3 Step Command - Max 3 pts   Screening Tests Health Maintenance  Topic Date Due  . TETANUS/TDAP  11/24/2016  . INFLUENZA VACCINE  11/27/2016  . COLONOSCOPY  06/14/2018  . PNA vac Low Risk Adult  Completed        Plan:     I have personally reviewed and addressed the Medicare Annual Wellness questionnaire and have noted the following in the patient's chart:  A. Medical and social history B. Use of alcohol, tobacco or illicit drugs  C. Current medications and supplements D. Functional ability and status E.  Nutritional status F.  Physical activity G. Advance directives H. List of  other physicians I.  Hospitalizations, surgeries, and ER visits in previous 12 months J.  [place] to include hearing, vision, cognitive, depression L. Referrals and appointments - none  In addition, I have reviewed and discussed with patient certain preventive protocols, quality metrics, and best practice recommendations. A written personalized care plan for preventive services as well as general preventive health recommendations were provided to patient.  See attached scanned questionnaire for additional information.   Signed,   Lindell Noe, MHA, BS, LPN Health Coach

## 2016-07-29 NOTE — Progress Notes (Signed)
Pre visit review using our clinic review tool, if applicable. No additional management support is needed unless otherwise documented below in the visit note. 

## 2016-07-29 NOTE — Patient Instructions (Signed)
Richard Saunders , Thank you for taking time to come for your Medicare Wellness Visit. I appreciate your ongoing commitment to your health goals. Please review the following plan we discussed and let me know if I can assist you in the future.   These are the goals we discussed: Goals    . Increase physical activity          When schedule permits, I will start exercising at least 15 min 3 days per week.        This is a list of the screening recommended for you and due dates:  Health Maintenance  Topic Date Due  . Tetanus Vaccine  11/24/2016  . Flu Shot  11/27/2016  . Colon Cancer Screening  06/14/2018  . Pneumonia vaccines  Completed   Preventive Care for Adults  A healthy lifestyle and preventive care can promote health and wellness. Preventive health guidelines for adults include the following key practices.  . A routine yearly physical is a good way to check with your health care provider about your health and preventive screening. It is a chance to share any concerns and updates on your health and to receive a thorough exam.  . Visit your dentist for a routine exam and preventive care every 6 months. Brush your teeth twice a day and floss once a day. Good oral hygiene prevents tooth decay and gum disease.  . The frequency of eye exams is based on your age, health, family medical history, use  of contact lenses, and other factors. Follow your health care provider's ecommendations for frequency of eye exams.  . Eat a healthy diet. Foods like vegetables, fruits, whole grains, low-fat dairy products, and lean protein foods contain the nutrients you need without too many calories. Decrease your intake of foods high in solid fats, added sugars, and salt. Eat the right amount of calories for you. Get information about a proper diet from your health care provider, if necessary.  . Regular physical exercise is one of the most important things you can do for your health. Most adults should  get at least 150 minutes of moderate-intensity exercise (any activity that increases your heart rate and causes you to sweat) each week. In addition, most adults need muscle-strengthening exercises on 2 or more days a week.  Silver Sneakers may be a benefit available to you. To determine eligibility, you may visit the website: www.silversneakers.com or contact program at (902)800-3100 Mon-Fri between 8AM-8PM.   . Maintain a healthy weight. The body mass index (BMI) is a screening tool to identify possible weight problems. It provides an estimate of body fat based on height and weight. Your health care provider can find your BMI and can help you achieve or maintain a healthy weight.   For adults 20 years and older: ? A BMI below 18.5 is considered underweight. ? A BMI of 18.5 to 24.9 is normal. ? A BMI of 25 to 29.9 is considered overweight. ? A BMI of 30 and above is considered obese.   . Maintain normal blood lipids and cholesterol levels by exercising and minimizing your intake of saturated fat. Eat a balanced diet with plenty of fruit and vegetables. Blood tests for lipids and cholesterol should begin at age 88 and be repeated every 5 years. If your lipid or cholesterol levels are high, you are over 50, or you are at high risk for heart disease, you may need your cholesterol levels checked more frequently. Ongoing high lipid and cholesterol levels  should be treated with medicines if diet and exercise are not working.  . If you smoke, find out from your health care provider how to quit. If you do not use tobacco, please do not start.  . If you choose to drink alcohol, please do not consume more than 2 drinks per day. One drink is considered to be 12 ounces (355 mL) of beer, 5 ounces (148 mL) of wine, or 1.5 ounces (44 mL) of liquor.  . If you are 80-87 years old, ask your health care provider if you should take aspirin to prevent strokes.  . Use sunscreen. Apply sunscreen liberally and  repeatedly throughout the day. You should seek shade when your shadow is shorter than you. Protect yourself by wearing long sleeves, pants, a wide-brimmed hat, and sunglasses year round, whenever you are outdoors.  . Once a month, do a whole body skin exam, using a mirror to look at the skin on your back. Tell your health care provider of new moles, moles that have irregular borders, moles that are larger than a pencil eraser, or moles that have changed in shape or color.

## 2016-08-06 ENCOUNTER — Encounter: Payer: Self-pay | Admitting: Family Medicine

## 2016-08-06 ENCOUNTER — Ambulatory Visit (INDEPENDENT_AMBULATORY_CARE_PROVIDER_SITE_OTHER): Payer: Medicare HMO | Admitting: Family Medicine

## 2016-08-06 VITALS — BP 122/76 | HR 63 | Temp 97.9°F | Wt 239.0 lb

## 2016-08-06 DIAGNOSIS — F341 Dysthymic disorder: Secondary | ICD-10-CM | POA: Diagnosis not present

## 2016-08-06 DIAGNOSIS — Z0000 Encounter for general adult medical examination without abnormal findings: Secondary | ICD-10-CM

## 2016-08-06 DIAGNOSIS — I493 Ventricular premature depolarization: Secondary | ICD-10-CM

## 2016-08-06 MED ORDER — POLYETHYLENE GLYCOL 3350 17 GM/SCOOP PO POWD: 17.0000 g | Freq: Two times a day (BID) | ORAL | Status: DC | PRN

## 2016-08-06 NOTE — Progress Notes (Signed)
D/w pt about balance and leg exercises, fall cautions, etc.  home exercises demonstrated for patient. He understood.  Mini-Cog score: 19/20.  D/w pt.  No red flag events.  We agreed to monitor.  He agreed.  He can still count cards when playing various card games.    PSA wnl.  Testosterone wnl.    Depression score: 5 (pt denies suicidal ideation at this time).  Mood is not as good as previous.  "I'm 75 and that changes things."  He is still looking for a sense of purpose daily and that has changed.  D/w pt about goals.  He doesn't get the same gratification that he prev got with work.  He is still active with friends and church, but that isn't the same.  D/w pt about exercise; this would be an appropriate intervention.  If mood is worse, then he'll update me. Very rare use of BZD.  D/w pt.    h/o PVC.  No skipped beats noted by patient.   Using medication without problems or lightheadedness: yes Chest pain with exertion:no Edema:no Short of breath:no Labs d/w pt.   GI sx much improved with probiotic with prn miralax and inc in water intake.    PMH and SH reviewed  ROS: Per HPI unless specifically indicated in ROS section   Meds, vitals, and allergies reviewed.   GEN: nad, alert and oriented HEENT: mucous membranes moist NECK: supple w/o LA CV: rrr.  SEM noted.  PULM: ctab, no inc wob ABD: soft, +bs EXT: no edema SKIN: no acute rash

## 2016-08-06 NOTE — Patient Instructions (Signed)
Update me as needed.  Take care.  Glad to see you.  Get into exercise in the meantime.

## 2016-08-07 DIAGNOSIS — Z0000 Encounter for general adult medical examination without abnormal findings: Secondary | ICD-10-CM | POA: Insufficient documentation

## 2016-08-07 NOTE — Assessment & Plan Note (Signed)
D/w pt about balance and leg exercises, fall cautions, etc.  home exercises demonstrated for patient. He understood.  Mini-Cog score: 19/20.  D/w pt.  No red flag events.  We agreed to monitor.  He agreed.  He can still count cards when playing various card games.    PSA wnl.  Testosterone wnl.

## 2016-08-07 NOTE — Assessment & Plan Note (Signed)
History of PVCs noted. No skipped beats noted by patient. Medication tolerated. Continue as is. Okay for outpatient follow-up. Discussed with patient about diet and exercise.

## 2016-08-07 NOTE — Assessment & Plan Note (Signed)
Mood discussed with patient. Exercise would likely help a lot. At this point still okay for outpatient follow-up. He will update me as needed. >25 minutes spent in face to face time with patient, >50% spent in counselling or coordination of care.

## 2016-08-28 NOTE — Progress Notes (Signed)
Cardiology Office Note    Date:  08/29/2016   ID:  Richard Saunders, DOB July 08, 1940, MRN [id number]  PCP:  Elsie Stain, MD  Cardiologist: Sinclair Grooms, MD   Chief Complaint  Patient presents with  . Irregular Heart Beat    History of Present Illness:  Richard Saunders is a 76 y.o. male who presents for aortic valve disease, left bundle branch block, premature ventricular contractions, hypertension, and conduction system disease.  Shortness of breath on exertion. Otherwise no complaints. No palpitations or syncope.  Past Medical History:  Diagnosis Date  . Allergy   . Anxiety   . Depression   . GERD (gastroesophageal reflux disease)   . H/O: rheumatic fever 2012   (per patient) - eval by [facility] with PVC's  . Heart block bundle branch   . History of chicken pox   . Hypertension   . Organic impotence     Past Surgical History:  Procedure Laterality Date  . Cautery for nosebleeds    . Right elbow bursa removed    . TONSILLECTOMY AND ADENOIDECTOMY  1947    Current Medications: Outpatient Medications Prior to Visit  Medication Sig Dispense Refill  . ALPRAZolam (XANAX) 0.25 MG tablet Take 0.5 tablets (0.125 mg total) by mouth daily as needed for anxiety. 30 tablet 0  . aspirin EC 81 MG tablet Take 1 tablet (81 mg total) by mouth daily.    . cromolyn (OPTICROM) 4 % ophthalmic solution Place 1 drop into both eyes daily.  2  . guaiFENesin (MUCINEX) 600 MG 12 hr tablet Take 1,200 mg by mouth daily as needed for cough or to loosen phlegm.     . metoprolol succinate (TOPROL-XL) 25 MG 24 hr tablet TAKE 1/2 TABLET (12.5 MG) BY MOUTH DAILY 45 tablet 0  . polyethylene glycol powder (GLYCOLAX/MIRALAX) powder Take 17 g by mouth 2 (two) times daily as needed.    . Probiotic Product (PROBIOTIC PO) Take 1 capsule by mouth daily.    . mometasone (NASONEX) 50 MCG/ACT nasal spray 1-2 sprays per nostril per day as needed for congestion.     No facility-administered  medications prior to visit.      Allergies:   Colchicine and Fluticasone propionate   Social History   Social History  . Marital status: Single    Spouse name: N/A  . Number of children: N/A  . Years of education: N/A   Occupational History  . Retired Environmental education officer Retired   Social History Main Topics  . Smoking status: Former Research scientist (life sciences)  . Smokeless tobacco: Never Used  . Alcohol use 0.0 oz/week     Comment: Rare  . Drug use: No  . Sexual activity: Not Asked   Other Topics Concern  . 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   ROS:   Please see the history of present illness.    Decreased hearing, dyspnea on exertion, back pain, easy bruising, and constipation. Prior history of left bundle branch block. Last 2 EKGs demonstrate normal conduction with the exception of first-degree AV block  All other systems reviewed and  are negative.   PHYSICAL EXAM:   VS:  BP 124/88 (BP Location: Left Arm)   Pulse (!) 59   Ht 6' 1"  (1.854 m)   Wt 243 lb (110.2 kg)   BMI 32.06 kg/m    GEN: Well nourished, well developed, in no acute distress  HEENT: normal  Neck: no JVD, carotid bruits, or masses Cardiac: RRR; no murmurs, rubs, or gallops,no edema  Respiratory:  Decreased breath sounds throughout the left lower lung. GI: soft, nontender, nondistended, + BS MS: no deformity or atrophy  Skin: warm and dry, no rash Neuro:  Alert and Oriented x 3, Strength and sensation are intact Psych: euthymic mood, full affect  Wt Readings from Last 3 Encounters:  08/29/16 243 lb (110.2 kg)  08/06/16 239 lb (108.4 kg)  07/29/16 235 lb 8 oz (106.8 kg)       Studies/Labs Reviewed:   EKG:  EKG  Normal sinus rhythm, interventricular conduction delay, first-degree AV block, resolution of bundle branch block, leftward axis.  Recent Labs: 07/29/2016: ALT 22; BUN 23; Creatinine, Ser 0.90; Hemoglobin 15.0; Platelets 115.0; Potassium 4.3; Sodium 144   Lipid Panel    Component Value Date/Time   CHOL 167 07/29/2016 0914   TRIG 68.0 07/29/2016 0914   HDL 71.20 07/29/2016 0914   CHOLHDL 2 07/29/2016 0914   VLDL 13.6 07/29/2016 0914   LDLCALC 82 07/29/2016 0914    Additional studies/ records that were reviewed today include:  No data from graphic or imaging studies.    ASSESSMENT:    1. Nonrheumatic aortic valve insufficiency   2. Left bundle branch block   3. Premature ventricular contractions   4. Left ventricular hypertrophy   5. SOB (shortness of breath)   6. Decreased breath sounds at left lung base      PLAN:  In order of problems listed above:  1. Clinical exam demonstrates a systolic murmur right upper sternal border without significant diastolic murmur. No significant change over time. No symptoms to suggest volume overload or pressure overload. Continued clinical follow-up. 2. Almost recent EKGs over the past several years, the left bundle branch block appearance is resolved. 3. Frequent PVCs that he once had have resolved. 4. From previous echocardiograms. No recent data. 5. Diminished left lung breath sounds. Rule out pleural effusion. Plan chest x-ray which also give me some idea about heart size.  Overall plan cardiology follow-up in one year. I encouraged aerobic activity. Depending upon chest x-ray results we may decide to do an echocardiogram.    Medication Adjustments/Labs and Tests Ordered: Current medicines are reviewed at length with the patient today.  Concerns regarding medicines are outlined above.  Medication changes, Labs and Tests ordered today are listed in the Patient Instructions below. Patient  Instructions  Medication Instructions:  None  Labwork: None  Testing/Procedures: A chest x-ray takes a picture of the organs and structures inside the chest, including the heart, lungs, and blood vessels. This test can show several things, including, whether the heart is enlarges; whether fluid is building up in the lungs; and whether pacemaker / defibrillator leads are still in place.   Follow-Up: Your physician wants you to follow-up in: 1 year with Dr. [name].  You will receive a reminder letter in the mail two months in advance. If you don't receive a letter, please call our office to schedule the follow-up appointment.   Any Other Special Instructions Will Be Listed Below (If Applicable).     If you need a refill  on your cardiac medications before your next appointment, please call your pharmacy.      Signed, Sinclair Grooms, MD  08/29/2016 1:53 PM    [facility] [place], [place], [place]  [address] Phone: [phone]; Fax: [phone]

## 2016-08-29 ENCOUNTER — Ambulatory Visit
Admission: RE | Admit: 2016-08-29 | Discharge: 2016-08-29 | Disposition: A | Discharge status: Home / Self Care | Payer: Medicare HMO | Source: Ambulatory Visit | Attending: Interventional Cardiology | Admitting: Interventional Cardiology

## 2016-08-29 ENCOUNTER — Encounter: Payer: Self-pay | Admitting: Interventional Cardiology

## 2016-08-29 ENCOUNTER — Ambulatory Visit (INDEPENDENT_AMBULATORY_CARE_PROVIDER_SITE_OTHER): Payer: Medicare HMO | Admitting: Interventional Cardiology

## 2016-08-29 VITALS — BP 124/88 | HR 59 | Ht 73.0 in | Wt 243.0 lb

## 2016-08-29 DIAGNOSIS — I493 Ventricular premature depolarization: Secondary | ICD-10-CM

## 2016-08-29 DIAGNOSIS — R0689 Other abnormalities of breathing: Secondary | ICD-10-CM

## 2016-08-29 DIAGNOSIS — R0602 Shortness of breath: Secondary | ICD-10-CM

## 2016-08-29 DIAGNOSIS — I447 Left bundle-branch block, unspecified: Secondary | ICD-10-CM

## 2016-08-29 DIAGNOSIS — R0989 Other specified symptoms and signs involving the circulatory and respiratory systems: Secondary | ICD-10-CM

## 2016-08-29 DIAGNOSIS — I351 Nonrheumatic aortic (valve) insufficiency: Secondary | ICD-10-CM

## 2016-08-29 DIAGNOSIS — I517 Cardiomegaly: Secondary | ICD-10-CM

## 2016-08-29 NOTE — Patient Instructions (Signed)
Medication Instructions:  None  Labwork: None  Testing/Procedures: A chest x-ray takes a picture of the organs and structures inside the chest, including the heart, lungs, and blood vessels. This test can show several things, including, whether the heart is enlarges; whether fluid is building up in the lungs; and whether pacemaker / defibrillator leads are still in place.   Follow-Up: Your physician wants you to follow-up in: 1 year with Dr. [name].  You will receive a reminder letter in the mail two months in advance. If you don't receive a letter, please call our office to schedule the follow-up appointment.   Any Other Special Instructions Will Be Listed Below (If Applicable).     If you need a refill on your cardiac medications before your next appointment, please call your pharmacy.

## 2016-09-02 NOTE — Addendum Note (Signed)
Addended by: Velna Ochs on: 09/02/2016 12:13 PM   Modules accepted: Orders

## 2016-11-01 ENCOUNTER — Other Ambulatory Visit: Payer: Self-pay

## 2016-11-01 MED ORDER — METOPROLOL SUCCINATE ER 25 MG PO TB24: ORAL_TABLET | ORAL | 3 refills | Status: DC

## 2016-11-05 ENCOUNTER — Other Ambulatory Visit: Payer: Self-pay | Admitting: Interventional Cardiology

## 2016-11-05 MED ORDER — METOPROLOL SUCCINATE ER 25 MG PO TB24: ORAL_TABLET | ORAL | 3 refills | Status: DC

## 2017-07-28 DIAGNOSIS — D3131 Benign neoplasm of right choroid: Secondary | ICD-10-CM | POA: Diagnosis not present

## 2017-07-28 DIAGNOSIS — H04123 Dry eye syndrome of bilateral lacrimal glands: Secondary | ICD-10-CM | POA: Diagnosis not present

## 2017-07-28 DIAGNOSIS — H4321 Crystalline deposits in vitreous body, right eye: Secondary | ICD-10-CM | POA: Diagnosis not present

## 2017-07-28 DIAGNOSIS — Z961 Presence of intraocular lens: Secondary | ICD-10-CM | POA: Diagnosis not present

## 2017-07-28 DIAGNOSIS — H40013 Open angle with borderline findings, low risk, bilateral: Secondary | ICD-10-CM | POA: Diagnosis not present

## 2017-07-28 DIAGNOSIS — H10413 Chronic giant papillary conjunctivitis, bilateral: Secondary | ICD-10-CM | POA: Diagnosis not present

## 2017-07-30 ENCOUNTER — Ambulatory Visit: Payer: Medicare HMO

## 2017-10-14 ENCOUNTER — Encounter

## 2017-10-14 ENCOUNTER — Ambulatory Visit: Payer: Medicare HMO | Admitting: Interventional Cardiology

## 2017-10-14 ENCOUNTER — Encounter: Payer: Self-pay | Admitting: Interventional Cardiology

## 2017-10-14 VITALS — BP 146/92 | HR 51 | Ht 74.0 in | Wt 233.4 lb

## 2017-10-14 DIAGNOSIS — I493 Ventricular premature depolarization: Secondary | ICD-10-CM | POA: Diagnosis not present

## 2017-10-14 DIAGNOSIS — I447 Left bundle-branch block, unspecified: Secondary | ICD-10-CM

## 2017-10-14 DIAGNOSIS — I440 Atrioventricular block, first degree: Secondary | ICD-10-CM | POA: Diagnosis not present

## 2017-10-14 DIAGNOSIS — I517 Cardiomegaly: Secondary | ICD-10-CM

## 2017-10-14 DIAGNOSIS — R011 Cardiac murmur, unspecified: Secondary | ICD-10-CM | POA: Diagnosis not present

## 2017-10-14 MED ORDER — METOPROLOL SUCCINATE ER 25 MG PO TB24: ORAL_TABLET | ORAL | 3 refills | Status: DC

## 2017-10-14 NOTE — Patient Instructions (Addendum)
Medication Instructions:  Your physician recommends that you continue on your current medications as directed. Please refer to the Current Medication list given to you today.  Labwork: None  Testing/Procedures: Your physician has requested that you have an echocardiogram. Echocardiography is a painless test that uses sound waves to create images of your heart. It provides your doctor with information about the size and shape of your heart and how well your heart's chambers and valves are working. This procedure takes approximately one hour. There are no restrictions for this procedure.   Follow-Up: Your physician wants you to follow-up in: 1 year with Dr. [name].  You will receive a reminder letter in the mail two months in advance. If you don't receive a letter, please call our office to schedule the follow-up appointment.   Any Other Special Instructions Will Be Listed Below (If Applicable).     If you need a refill on your cardiac medications before your next appointment, please call your pharmacy.

## 2017-10-14 NOTE — Progress Notes (Signed)
Cardiology Office Note    Date:  10/14/2017   ID:  Richard Saunders, DOB 06-08-1940, MRN [id number]  PCP:  Tonia Ghent, MD  Cardiologist: Sinclair Grooms, MD   Chief Complaint  Patient presents with  . Cardiac Valve Problem    History of Present Illness:  Richard Saunders is a 77 y.o. male who presents for aortic valve disease, left bundle branch block, premature ventricular contractions, hypertension, and conduction system disease.  Richard Saunders is doing well.  No change in exertional tolerance over the past year.  He denies orthopnea, PND, syncope, Estrace left lower extremity edema from time to time.  Usually goes away by the next morning.   Past Medical History:  Diagnosis Date  . Allergy   . Anxiety   . Depression   . GERD (gastroesophageal reflux disease)   . H/O: rheumatic fever 2012   (per patient) - eval by [facility] with PVC's  . Heart block bundle branch   . History of chicken pox   . Hypertension   . Organic impotence     Past Surgical History:  Procedure Laterality Date  . Cautery for nosebleeds    . Right elbow bursa removed    . TONSILLECTOMY AND ADENOIDECTOMY  1947    Current Medications: Outpatient Medications Prior to Visit  Medication Sig Dispense Refill  . ALPRAZolam (XANAX) 0.25 MG tablet Take 0.5 tablets (0.125 mg total) by mouth daily as needed for anxiety. 30 tablet 0  . aspirin EC 81 MG tablet Take 1 tablet (81 mg total) by mouth daily.    . cromolyn (OPTICROM) 4 % ophthalmic solution Place 1 drop into both eyes daily.  2  . guaiFENesin (MUCINEX) 600 MG 12 hr tablet Take 1,200 mg by mouth daily as needed for cough or to loosen phlegm.     . polyethylene glycol powder (GLYCOLAX/MIRALAX) powder Take 17 g by mouth 2 (two) times daily as needed.    . Probiotic Product (PROBIOTIC PO) Take 1 capsule by mouth daily.    . metoprolol succinate (TOPROL-XL) 25 MG 24 hr tablet TAKE 1/2 TABLET (12.5 MG) BY MOUTH DAILY 45 tablet 3   No  facility-administered medications prior to visit.      Allergies:   Colchicine and Fluticasone propionate   Social History   Socioeconomic History  . Marital status: Single    Spouse name: Not on file  . Number of children: Not on file  . Years of education: Not on file  . Highest education level: Not on file  Occupational History  . 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 Financial resource strain: Not on file  . Food insecurity:    Worry: Not on file    Inability: Not on file  . Transportation needs:    Medical: Not on file    Non-medical: Not on file  Tobacco Use  . Smoking status: Former Research scientist (life sciences)  . Smokeless tobacco: Never Used  Substance and Sexual Activity  . Alcohol use: Yes    Alcohol/week: 0.0 oz    Comment: Rare  . Drug use: No  . Sexual activity: Not on file  Lifestyle  . Physical activity:    Days per week: Not on file    Minutes per session: Not on file  . Stress: Not on file  Relationships  . Social connections:    Talks on phone: Not on file    Gets together: Not  on file    Attends religious service: Not on file    Active member of club or organization: Not on file    Attends meetings of clubs or organizations: Not on file    Relationship status: Not on file  Other Topics Concern  . 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   ROS:   Please see the history of present illness.    Sedentary, dyspnea on exertion, occasional left lower extremity edema.  Denies orthopnea, snoring, and frequent awakening. All other systems reviewed and are negative.   PHYSICAL EXAM:   VS:  BP (!)  146/92   Pulse (!) 51   Ht 6' 2"  (1.88 m)   Wt 233 lb 6.4 oz (105.9 kg)   BMI 29.97 kg/m    GEN: Well nourished, well developed, in no acute distress  HEENT: normal  Neck: no JVD, carotid bruits, or masses Cardiac: RRR, rubs, or gallops,no edema.  2/6 systolic murmur right upper sternal border and left midsternal border.  No diastolic murmur. Respiratory:  clear to auscultation bilaterally, normal work of breathing GI: soft, nontender, nondistended, + BS MS: no deformity or atrophy  Skin: warm and dry, no rash Neuro:  Alert and Oriented x 3, Strength and sensation are intact Psych: euthymic mood, full affect  Wt Readings from Last 3 Encounters:  10/14/17 233 lb 6.4 oz (105.9 kg)  08/29/16 243 lb (110.2 kg)  08/06/16 239 lb (108.4 kg)      Studies/Labs Reviewed:   EKG:  EKG sinus rhythm, first-degree AV block, interventricular conduction delay.  No changes noted compared to May 2018.  Recent Labs: No results found for requested labs within last 8760 hours.   Lipid Panel    Component Value Date/Time   CHOL 167 07/29/2016 0914   TRIG 68.0 07/29/2016 0914   HDL 71.20 07/29/2016 0914   CHOLHDL 2 07/29/2016 0914   VLDL 13.6 07/29/2016 0914   LDLCALC 82 07/29/2016 0914    Additional studies/ records that were reviewed today include:  Recent cardiac imaging    ASSESSMENT:    1. Systolic murmur   2. First degree AV block   3. Left bundle branch block   4. Left ventricular hypertrophy   5. Premature ventricular contractions      PLAN:  In order of problems listed above:  1. Given history of rheumatic fever as a child (according to the patient on today's encounter) we will perform an echocardiogram to determine if there is progression of aortic valve disease. 2. Stable first-degree AV block.  Will be followed chronically.  Attributed to by low-dose beta-blocker. 3. Interventricular conduction delay is noted but no diagnostic criteria for left bundle.   4. Not  addressed.  5. Low-dose beta-blocker is being used to suppress ventricular ectopy.    Medication Adjustments/Labs and Tests Ordered: Current medicines are reviewed at length with the patient today.  Concerns regarding medicines are outlined above.  Medication changes, Labs and Tests ordered today are listed in the Patient Instructions below. Patient Instructions  Medication Instructions:  Your physician recommends that you continue on your current medications as  directed. Please refer to the Current Medication list given to you today.  Labwork: None  Testing/Procedures: Your physician has requested that you have an echocardiogram. Echocardiography is a painless test that uses sound waves to create images of your heart. It provides your doctor with information about the size and shape of your heart and how well your heart's chambers and valves are working. This procedure takes approximately one hour. There are no restrictions for this procedure.   Follow-Up: Your physician wants you to follow-up in: 1 year with Dr. [name].  You will receive a reminder letter in the mail two months in advance. If you don't receive a letter, please call our office to schedule the follow-up appointment.   Any Other Special Instructions Will Be Listed Below (If Applicable).     If you need a refill on your cardiac medications before your next appointment, please call your pharmacy.      Signed, Sinclair Grooms, MD  10/14/2017 11:51 AM    [facility] [place], [place], [place]  [address] Phone: [phone]; Fax: [phone]

## 2017-10-16 ENCOUNTER — Ambulatory Visit (INDEPENDENT_AMBULATORY_CARE_PROVIDER_SITE_OTHER): Payer: Medicare HMO

## 2017-10-16 ENCOUNTER — Other Ambulatory Visit: Payer: Self-pay | Admitting: Family Medicine

## 2017-10-16 VITALS — BP 150/90 | HR 51 | Temp 97.8°F | Ht 73.0 in | Wt 230.5 lb

## 2017-10-16 DIAGNOSIS — Z0000 Encounter for general adult medical examination without abnormal findings: Secondary | ICD-10-CM

## 2017-10-16 DIAGNOSIS — M10071 Idiopathic gout, right ankle and foot: Secondary | ICD-10-CM

## 2017-10-16 DIAGNOSIS — Z125 Encounter for screening for malignant neoplasm of prostate: Secondary | ICD-10-CM | POA: Diagnosis not present

## 2017-10-16 LAB — COMPREHENSIVE METABOLIC PANEL
ALBUMIN: 3.9 g/dL (ref 3.5–5.2)
ALK PHOS: 50 U/L (ref 39–117)
ALT: 21 U/L (ref 0–53)
AST: 32 U/L (ref 0–37)
BUN: 20 mg/dL (ref 6–23)
CALCIUM: 9.7 mg/dL (ref 8.4–10.5)
CHLORIDE: 105 meq/L (ref 96–112)
CO2: 30 mEq/L (ref 19–32)
Creatinine, Ser: 0.89 mg/dL (ref 0.40–1.50)
GFR: 88.2 mL/min (ref >60.00)
Glucose, Bld: 98 mg/dL (ref 70–99)
Potassium: 4.5 mEq/L (ref 3.5–5.1)
SODIUM: 141 meq/L (ref 135–145)
TOTAL PROTEIN: 6.8 g/dL (ref 6.0–8.3)
Total Bilirubin: 2.2 mg/dL — ABNORMAL HIGH (ref 0.2–1.2)

## 2017-10-16 LAB — URIC ACID: Uric Acid, Serum: 5.8 mg/dL (ref 4.0–7.8)

## 2017-10-16 LAB — PSA, MEDICARE: PSA: 0.32 ng/ml (ref 0.10–4.00)

## 2017-10-16 NOTE — Progress Notes (Signed)
Subjective:   Richard Saunders is a 77 y.o. male who presents for Medicare Annual/Subsequent preventive examination.  Review of Systems:  N/A Cardiac Risk Factors include: advanced age (>53mn, >>46women);male gender     Objective:    Vitals: BP (!) 150/90 (BP Location: Right Arm, Patient Position: Sitting, Cuff Size: Normal)   Pulse (!) 51   Temp 97.8 F (36.6 C) (Oral)   Ht 6' 1"  (1.854 m) Comment: no shoes  Wt 230 lb 8 oz (104.6 kg)   SpO2 95%   BMI 30.41 kg/m   Body mass index is 30.41 kg/m.  Advanced Directives 10/16/2017 07/29/2016  Does Patient Have a Medical Advance Directive? Yes Yes  Type of AParamedicof AFriedensLiving will [place] will  Copy of [place] Chart? No - copy requested No - copy requested    Tobacco Social History   Tobacco Use  Smoking Status Former Smoker  Smokeless Tobacco Never Used     Counseling given: No   Clinical Intake:  Pre-visit preparation completed: Yes  Pain : 0-10 Pain Score: 1  Pain Type: Chronic pain Pain Location: Back Pain Frequency: Constant     Nutritional Status: BMI > 30  Obese Nutritional Risks: None Diabetes: No  How often do you need to have someone help you when you read instructions, pamphlets, or other written materials from your doctor or pharmacy?: 1 - Never What is the last grade level you completed in school?: 12th grade + trade school  Interpreter Needed?: No  Comments: pt lives alone Information entered by :: [employer] LPN  Past Medical History:  Diagnosis Date  . Allergy   . Anxiety   . Depression   . GERD (gastroesophageal reflux disease)   . H/O: rheumatic fever 2012   (per patient) - eval by [facility] with PVC's  . Heart block bundle branch   . History of chicken pox   . Hypertension   . Organic impotence    Past Surgical History:  Procedure Laterality Date  . Cautery for nosebleeds    . Right  elbow bursa removed    . TONSILLECTOMY AND ADENOIDECTOMY  1947   Family History  Problem Relation Age of Onset  . Cancer Mother        ovarian, uterine  . Aneurysm Mother        AAA in 115 . Cancer Father        Prostate  . COPD Father   . Prostate cancer Father   . Diabetes Other   . Heart disease Other        CAD <60 male  . Hypertension Other    Social History   Socioeconomic History  . Marital status: Single    Spouse name: Not on file  . Number of children: Not on file  . Years of education: Not on file  . Highest education level: Not on file  Occupational History  . 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 Financial resource strain: Not on file  . Food insecurity:    Worry: Not on file    Inability: Not on file  . Transportation needs:    Medical: Not on file    Non-medical: Not on file  Tobacco Use  . Smoking status: Former SResearch scientist (life sciences) . Smokeless tobacco: Never Used  Substance and Sexual Activity  . Alcohol use: Yes    Alcohol/week: 0.0  oz    Comment: Rare  . Drug use: No  . Sexual activity: Not on file  Lifestyle  . Physical activity:    Days per week: Not on file    Minutes per session: Not on file  . Stress: Not on file  Relationships  . Social connections:    Talks on phone: Not on file    Gets together: Not on file    Attends religious service: Not on file    Active member of club or organization: Not on file    Attends meetings of clubs or organizations: Not on file    Relationship status: Not on file  Other Topics Concern  . 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 ALPRAZolam (XANAX) 0.25 MG tablet Take 0.5 tablets (0.125 mg total) by mouth daily as needed for anxiety.  Marland Kitchen aspirin EC 81 MG tablet Take 1 tablet (81 mg total) by mouth daily.    . cromolyn (OPTICROM) 4 % ophthalmic solution Place 1 drop into both eyes daily.  Marland Kitchen guaiFENesin (MUCINEX) 600 MG 12 hr tablet Take 1,200 mg by mouth daily as needed for cough or to loosen phlegm.   . metoprolol succinate (TOPROL-XL) 25 MG 24 hr tablet TAKE 1/2 TABLET (12.5 MG) BY MOUTH DAILY  . polyethylene glycol powder (GLYCOLAX/MIRALAX) powder Take 17 g by mouth 2 (two) times daily as needed.  . [DISCONTINUED] Probiotic Product (PROBIOTIC PO) Take 1 capsule by mouth daily.   No facility-administered encounter medications on file as of 10/16/2017.     Activities of Daily Living In your present state of health, do you have any difficulty performing the following activities: 10/16/2017  Hearing? Y  Vision? N  Difficulty concentrating or making decisions? N  Walking or climbing stairs? Y  Dressing or bathing? N  Doing errands, shopping? N  Preparing Food and eating ? N  Using the Toilet? N  In the past six months, have you accidently leaked urine? N  Do you have problems with loss of bowel control? N  Managing your Medications? N  Managing your Finances? N  Housekeeping or managing your Housekeeping? N  Some recent data might be hidden    Patient Care Team: Tonia Ghent, MD as PCP - General (Family Medicine) Belva Crome, MD as Consulting Physician (Cardiology) Clent Jacks, MD as Consulting Physician (Ophthalmology)   Assessment:   This is a routine wellness examination for Richard Saunders.  Hearing Screening Comments: Pt has hearing aids but does not wear them Vision Screening Comments: July 28, 2017 with Dr. Katy Fitch   Exercise Activities and Dietary recommendations Current Exercise Habits: Home exercise routine, Type of exercise: walking, Time (Minutes): 15, Frequency (Times/Week): 7, Weekly Exercise (Minutes/Week): 105, Intensity: Mild, Exercise limited by: None identified  Goals    . Patient Stated     Starting 10/16/2017, I will continue to walk at least 15 minutes daily.         Fall Risk Fall Risk  10/16/2017 07/29/2016  Falls in the past year? No No   Depression Screen PHQ 2/9 Scores 10/16/2017 07/29/2016  PHQ - 2 Score 0 2  PHQ- 9 Score 2 5    Cognitive Function MMSE - Mini Mental State Exam 10/16/2017 07/29/2016  Orientation to time 5 5  Orientation to Place 5  5  Registration 3 3  Attention/ Calculation 0 0  Recall 3 2  Recall-comments - pt was unable to recall 1 of 3 words  Language- name 2 objects 0 0  Language- repeat 1 1  Language- follow 3 step command 3 3  Language- read & follow direction 0 0  Write a sentence 0 0  Copy design 0 0  Total score 20 19     PLEASE NOTE: A Mini-Cog screen was completed. Maximum score is 20. A value of 0 denotes this part of Folstein MMSE was not completed or the patient failed this part of the Mini-Cog screening.   Mini-Cog Screening Orientation to Time - Max 5 pts Orientation to Place - Max 5 pts Registration - Max 3 pts Recall - Max 3 pts Language Repeat - Max 1 pts Language Follow 3 Step Command - Max 3 pts     Immunization History  Administered Date(s) Administered  . Pneumococcal Conjugate-13 07/29/2016  . Pneumococcal Polysaccharide-23 06/07/2010  . Td 11/25/2006    Screening Tests Health Maintenance  Topic Date Due  . TETANUS/TDAP  10/17/2018 (Originally 11/24/2016)  . INFLUENZA VACCINE  11/27/2017  . PNA vac Low Risk Adult  Completed      Plan:     I have personally reviewed, addressed, and noted the following in the patient's chart:  A. Medical and social history B. Use of alcohol, tobacco or illicit drugs  C. Current medications and supplements D. Functional ability and status E.  Nutritional status F.  Physical activity G. Advance directives H. List of other physicians I.  Hospitalizations, surgeries, and ER visits in previous 12 months J.  [place] to include hearing, vision, cognitive, depression L. Referrals and appointments - none  In addition, I have  reviewed and discussed with patient certain preventive protocols, quality metrics, and best practice recommendations. A written personalized care plan for preventive services as well as general preventive health recommendations were provided to patient.  See attached scanned questionnaire for additional information.   Signed,   Lindell Noe, MHA, BS, LPN Health Coach

## 2017-10-16 NOTE — Progress Notes (Signed)
PCP notes:   Health maintenance:  Tetanus vaccine - postponed/insurance . Abnormal screenings:   Depression score: 2 Depression screen [facility] 2/9 10/16/2017 07/29/2016  Decreased Interest 0 1  Down, Depressed, Hopeless 0 1  PHQ - 2 Score 0 2  Altered sleeping 1 1  Tired, decreased energy 1 2  Change in appetite 0 0  Feeling bad or failure about yourself  0 0  Trouble concentrating 0 0  Moving slowly or fidgety/restless 0 0  Suicidal thoughts 0 0  PHQ-9 Score 2 5  Difficult doing work/chores Not difficult at all Somewhat difficult   Patient concerns:   None  Nurse concerns:  None  Next PCP appt:   10/27/17 @ 1500 I reviewed health advisor's note, was available for consultation on the day of service listed in this note, and agree with documentation and plan. Elsie Stain, MD.

## 2017-10-16 NOTE — Patient Instructions (Addendum)
Richard Saunders , Thank you for taking time to come for your Medicare Wellness Visit. I appreciate your ongoing commitment to your health goals. Please review the following plan we discussed and let me know if I can assist you in the future.   These are the goals we discussed: Goals    . Patient Stated     Starting 10/16/2017, I will continue to walk at least 15 minutes daily.        This is a list of the screening recommended for you and due dates:  Health Maintenance  Topic Date Due  . Tetanus Vaccine  10/17/2018*  . Flu Shot  11/27/2017  . Pneumonia vaccines  Completed  *Topic was postponed. The date shown is not the original due date.   Preventive Care for Adults  A healthy lifestyle and preventive care can promote health and wellness. Preventive health guidelines for adults include the following key practices.  . A routine yearly physical is a good way to check with your health care provider about your health and preventive screening. It is a chance to share any concerns and updates on your health and to receive a thorough exam.  . Visit your dentist for a routine exam and preventive care every 6 months. Brush your teeth twice a day and floss once a day. Good oral hygiene prevents tooth decay and gum disease.  . The frequency of eye exams is based on your age, health, family medical history, use  of contact lenses, and other factors. Follow your health care provider's recommendations for frequency of eye exams.  . Eat a healthy diet. Foods like vegetables, fruits, whole grains, low-fat dairy products, and lean protein foods contain the nutrients you need without too many calories. Decrease your intake of foods high in solid fats, added sugars, and salt. Eat the right amount of calories for you. Get information about a proper diet from your health care provider, if necessary.  . Regular physical exercise is one of the most important things you can do for your health. Most adults  should get at least 150 minutes of moderate-intensity exercise (any activity that increases your heart rate and causes you to sweat) each week. In addition, most adults need muscle-strengthening exercises on 2 or more days a week.  Silver Sneakers may be a benefit available to you. To determine eligibility, you may visit the website: www.silversneakers.com or contact program at 7076120876 Mon-Fri between 8AM-8PM.   . Maintain a healthy weight. The body mass index (BMI) is a screening tool to identify possible weight problems. It provides an estimate of body fat based on height and weight. Your health care provider can find your BMI and can help you achieve or maintain a healthy weight.   For adults 20 years and older: ? A BMI below 18.5 is considered underweight. ? A BMI of 18.5 to 24.9 is normal. ? A BMI of 25 to 29.9 is considered overweight. ? A BMI of 30 and above is considered obese.   . Maintain normal blood lipids and cholesterol levels by exercising and minimizing your intake of saturated fat. Eat a balanced diet with plenty of fruit and vegetables. Blood tests for lipids and cholesterol should begin at age 58 and be repeated every 5 years. If your lipid or cholesterol levels are high, you are over 50, or you are at high risk for heart disease, you may need your cholesterol levels checked more frequently. Ongoing high lipid and cholesterol levels should be treated  with medicines if diet and exercise are not working.  . If you smoke, find out from your health care provider how to quit. If you do not use tobacco, please do not start.  . If you choose to drink alcohol, please do not consume more than 2 drinks per day. One drink is considered to be 12 ounces (355 mL) of beer, 5 ounces (148 mL) of wine, or 1.5 ounces (44 mL) of liquor.  . If you are 90-23 years old, ask your health care provider if you should take aspirin to prevent strokes.  . Use sunscreen. Apply sunscreen liberally and  repeatedly throughout the day. You should seek shade when your shadow is shorter than you. Protect yourself by wearing long sleeves, pants, a wide-brimmed hat, and sunglasses year round, whenever you are outdoors.  . Once a month, do a whole body skin exam, using a mirror to look at the skin on your back. Tell your health care provider of new moles, moles that have irregular borders, moles that are larger than a pencil eraser, or moles that have changed in shape or color.

## 2017-10-21 ENCOUNTER — Ambulatory Visit (HOSPITAL_COMMUNITY): Payer: Medicare HMO | Attending: Cardiovascular Disease

## 2017-10-21 ENCOUNTER — Other Ambulatory Visit: Payer: Self-pay

## 2017-10-21 DIAGNOSIS — Z87891 Personal history of nicotine dependence: Secondary | ICD-10-CM | POA: Diagnosis not present

## 2017-10-21 DIAGNOSIS — Z6830 Body mass index (BMI) 30.0-30.9, adult: Secondary | ICD-10-CM | POA: Insufficient documentation

## 2017-10-21 DIAGNOSIS — I10 Essential (primary) hypertension: Secondary | ICD-10-CM | POA: Insufficient documentation

## 2017-10-21 DIAGNOSIS — I440 Atrioventricular block, first degree: Secondary | ICD-10-CM | POA: Diagnosis not present

## 2017-10-21 DIAGNOSIS — I083 Combined rheumatic disorders of mitral, aortic and tricuspid valves: Secondary | ICD-10-CM | POA: Insufficient documentation

## 2017-10-21 DIAGNOSIS — I447 Left bundle-branch block, unspecified: Secondary | ICD-10-CM | POA: Diagnosis not present

## 2017-10-21 DIAGNOSIS — E669 Obesity, unspecified: Secondary | ICD-10-CM | POA: Insufficient documentation

## 2017-10-21 DIAGNOSIS — R011 Cardiac murmur, unspecified: Secondary | ICD-10-CM | POA: Diagnosis not present

## 2017-10-27 ENCOUNTER — Ambulatory Visit (INDEPENDENT_AMBULATORY_CARE_PROVIDER_SITE_OTHER): Payer: Medicare HMO | Admitting: Family Medicine

## 2017-10-27 ENCOUNTER — Encounter: Payer: Self-pay | Admitting: Family Medicine

## 2017-10-27 VITALS — BP 120/74 | HR 84 | Temp 98.6°F | Ht 73.0 in | Wt 228.8 lb

## 2017-10-27 DIAGNOSIS — I493 Ventricular premature depolarization: Secondary | ICD-10-CM

## 2017-10-27 DIAGNOSIS — Z0000 Encounter for general adult medical examination without abnormal findings: Secondary | ICD-10-CM

## 2017-10-27 DIAGNOSIS — F341 Dysthymic disorder: Secondary | ICD-10-CM

## 2017-10-27 DIAGNOSIS — M109 Gout, unspecified: Secondary | ICD-10-CM

## 2017-10-27 DIAGNOSIS — Z7189 Other specified counseling: Secondary | ICD-10-CM

## 2017-10-27 MED ORDER — ALPRAZOLAM 0.25 MG PO TABS: 0.1250 mg | ORAL_TABLET | Freq: Every day | ORAL | 0 refills | Status: DC | PRN

## 2017-10-27 NOTE — Patient Instructions (Addendum)
Check with your insurance to see if they will cover the Tdap and shingrix  shots.  It may be cheaper at the pharmacy.   I would get a flu shot each fall.   Take care.  Glad to see you.  Update me as needed.

## 2017-10-27 NOTE — Progress Notes (Signed)
Rare use of BZD for anxiety.  Prn use.  30 pills in the last 3 years.   No ADE on med.  Mood is good overall.  He has had several friends die, discussed.  He is trying to work through that.  "I try to stay busy."     H/o PVC. Controlled with med.   Using medication without problems or lightheadedness: yes Chest pain with exertion:no Edema:no Short of breath:no  Gout.  Uric acid wnl.  No flares recently.  No proph meds.  Labs d/w pt.   PSA wnl.   Colonoscopy 2010.  Living will d/w pt.  Would have daughter April designated if patient were incapacitated.   Vaccines d/w pt.  See AVS.    Meds, vitals, and allergies reviewed.   PMH and SH reviewed  ROS: Per HPI unless specifically indicated in ROS section   GEN: nad, alert and oriented HEENT: mucous membranes moist NECK: supple w/o LA CV: rrr. PULM: ctab, no inc wob ABD: soft, +bs EXT: no edema SKIN: no acute rash

## 2017-10-28 DIAGNOSIS — Z0000 Encounter for general adult medical examination without abnormal findings: Secondary | ICD-10-CM | POA: Insufficient documentation

## 2017-10-28 DIAGNOSIS — Z7189 Other specified counseling: Secondary | ICD-10-CM | POA: Insufficient documentation

## 2017-10-28 NOTE — Assessment & Plan Note (Signed)
Controlled with med.  Labs d/w pt.  Continue as is.  He agrees.>25 minutes spent in face to face time with patient, >50% spent in counselling or coordination of care discussing anxiety, PVC, etc.

## 2017-10-28 NOTE — Assessment & Plan Note (Signed)
PSA wnl.   Colonoscopy 2010.  Living will d/w pt.  Would have daughter April designated if patient were incapacitated.   Vaccines d/w pt.  See AVS.

## 2017-10-28 NOTE — Assessment & Plan Note (Signed)
Uric acid wnl.  No flares recently.  No proph meds.  Labs d/w pt.

## 2017-10-28 NOTE — Assessment & Plan Note (Signed)
Rare use of BZD for anxiety.  Prn use.  30 pills in the last 3 years.   No ADE on med.  Mood is good overall.  He has had several friends die, discussed.  He is trying to work through that.  "I try to stay busy."    Continue as is.  He'll update me as needed.  Okay for outpatient f/u.

## 2017-10-28 NOTE — Assessment & Plan Note (Signed)
Living will d/w pt.  Would have daughter April designated if patient were incapacitated.

## 2017-11-24 ENCOUNTER — Emergency Department (HOSPITAL_COMMUNITY): Payer: Medicare HMO

## 2017-11-24 ENCOUNTER — Ambulatory Visit: Payer: Self-pay | Admitting: *Deleted

## 2017-11-24 ENCOUNTER — Emergency Department (HOSPITAL_COMMUNITY)
Admission: EM | Admit: 2017-11-24 | Discharge: 2017-11-24 | Disposition: A | Discharge status: Home / Self Care | Payer: Medicare HMO | Attending: Emergency Medicine | Admitting: Emergency Medicine

## 2017-11-24 ENCOUNTER — Encounter (HOSPITAL_COMMUNITY): Payer: Self-pay

## 2017-11-24 ENCOUNTER — Other Ambulatory Visit: Payer: Self-pay

## 2017-11-24 DIAGNOSIS — Z7982 Long term (current) use of aspirin: Secondary | ICD-10-CM | POA: Diagnosis not present

## 2017-11-24 DIAGNOSIS — R29810 Facial weakness: Secondary | ICD-10-CM | POA: Diagnosis not present

## 2017-11-24 DIAGNOSIS — G510 Bell's palsy: Secondary | ICD-10-CM | POA: Insufficient documentation

## 2017-11-24 DIAGNOSIS — Z79899 Other long term (current) drug therapy: Secondary | ICD-10-CM | POA: Insufficient documentation

## 2017-11-24 DIAGNOSIS — I10 Essential (primary) hypertension: Secondary | ICD-10-CM | POA: Insufficient documentation

## 2017-11-24 DIAGNOSIS — R200 Anesthesia of skin: Secondary | ICD-10-CM | POA: Diagnosis not present

## 2017-11-24 DIAGNOSIS — Z87891 Personal history of nicotine dependence: Secondary | ICD-10-CM | POA: Insufficient documentation

## 2017-11-24 LAB — DIFFERENTIAL
ABS IMMATURE GRANULOCYTES: 0 10*3/uL (ref 0.0–0.1)
BASOS ABS: 0 10*3/uL (ref 0.0–0.1)
BASOS PCT: 0 %
Eosinophils Absolute: 0.1 10*3/uL (ref 0.0–0.7)
Eosinophils Relative: 1 %
Immature Granulocytes: 0 %
LYMPHS PCT: 21 %
Lymphs Abs: 1.4 10*3/uL (ref 0.7–4.0)
Monocytes Absolute: 0.5 10*3/uL (ref 0.1–1.0)
Monocytes Relative: 7 %
NEUTROS ABS: 4.6 10*3/uL (ref 1.7–7.7)
NEUTROS PCT: 71 %

## 2017-11-24 LAB — PROTIME-INR
INR: 1.1
Prothrombin Time: 14.1 seconds (ref 11.4–15.2)

## 2017-11-24 LAB — COMPREHENSIVE METABOLIC PANEL
ALBUMIN: 3.7 g/dL (ref 3.5–5.0)
ALT: 28 U/L (ref 0–44)
AST: 43 U/L — AB (ref 15–41)
Alkaline Phosphatase: 57 U/L (ref 38–126)
Anion gap: 8 (ref 5–15)
BUN: 16 mg/dL (ref 8–23)
CHLORIDE: 107 mmol/L (ref 98–111)
CO2: 24 mmol/L (ref 22–32)
CREATININE: 1.07 mg/dL (ref 0.61–1.24)
Calcium: 9.5 mg/dL (ref 8.9–10.3)
GFR calc Af Amer: >60 mL/min (ref >60)
GFR calc non Af Amer: >60 mL/min (ref >60)
GLUCOSE: 106 mg/dL — AB (ref 70–99)
Potassium: 4.3 mmol/L (ref 3.5–5.1)
SODIUM: 139 mmol/L (ref 135–145)
Total Bilirubin: 2.4 mg/dL — ABNORMAL HIGH (ref 0.3–1.2)
Total Protein: 7.1 g/dL (ref 6.5–8.1)

## 2017-11-24 LAB — CBC
HEMATOCRIT: 45.9 % (ref 39.0–52.0)
HEMOGLOBIN: 15.5 g/dL (ref 13.0–17.0)
MCH: 33.7 pg (ref 26.0–34.0)
MCHC: 33.8 g/dL (ref 30.0–36.0)
MCV: 99.8 fL (ref 78.0–100.0)
Platelets: 147 10*3/uL — ABNORMAL LOW (ref 150–400)
RBC: 4.6 MIL/uL (ref 4.22–5.81)
RDW: 13 % (ref 11.5–15.5)
WBC: 6.5 10*3/uL (ref 4.0–10.5)

## 2017-11-24 LAB — I-STAT TROPONIN, ED: Troponin i, poc: 0.01 ng/mL (ref 0.00–0.08)

## 2017-11-24 LAB — APTT: APTT: 34 s (ref 24–36)

## 2017-11-24 MED ORDER — PREDNISONE 20 MG PO TABS: 40.0000 mg | ORAL_TABLET | Freq: Every day | ORAL | 0 refills | Status: AC

## 2017-11-24 MED ORDER — VALACYCLOVIR HCL 1 G PO TABS: 1000.0000 mg | ORAL_TABLET | Freq: Two times a day (BID) | ORAL | 0 refills | Status: DC

## 2017-11-24 MED ORDER — PREDNISONE 20 MG PO TABS: 40.0000 mg | ORAL_TABLET | Freq: Every day | ORAL | 0 refills | Status: DC

## 2017-11-24 MED ORDER — HYPROMELLOSE (GONIOSCOPIC) 2.5 % OP SOLN
2.0000 [drp] | OPHTHALMIC | Status: DC | PRN
  Administered 2017-11-24: 2 [drp] via OPHTHALMIC
  Filled 2017-11-24: qty 15

## 2017-11-24 MED ORDER — VALACYCLOVIR HCL 1 G PO TABS: 1000.0000 mg | ORAL_TABLET | Freq: Two times a day (BID) | ORAL | 0 refills | Status: AC

## 2017-11-24 MED ORDER — HYPROMELLOSE (GONIOSCOPIC) 2.5 % OP SOLN: 1.0000 [drp] | Freq: Four times a day (QID) | OPHTHALMIC | 12 refills | Status: DC | PRN

## 2017-11-24 NOTE — ED Notes (Signed)
Patient transported to MRI 

## 2017-11-24 NOTE — Telephone Encounter (Signed)
I spoke with pt; lt eye is not closing all the way. Pt's pastor is presently driving pt to [facility] ED. Pt will be seen at ED today. Pt has drolling issue when tries to drink anything. FYI to Dr Damita Dunnings.Marland Kitchen

## 2017-11-24 NOTE — ED Provider Notes (Signed)
Patient placed in Quick Look pathway, seen and evaluated   Chief Complaint: Left-sided facial numbness and weakness  HPI: Patient presents for evaluation of new onset numbness over the left lip and face, as well as weakness.  Went to bed at midnight and was normal at that time, but when he woke up to drink his coffee today he realized that he could not feel the weak coffee and has had a hard time drinking due to weakness, he is not able to completely close his eye.  He does not think he is having weakness in his arms or legs.  No prior history of stroke.  ROS: + Facial weakness and numbness. -Headache, vision changes, extremity weakness or numbness  Physical Exam:   Gen: No distress  Neuro: Awake and Alert  Skin: Warm    Focused Exam:  Mental Status: Alert and oriented to person, place, and time. Attention and concentration normal. Speech clear. Recent memory is intact. Follows commands. Cranial Nerves: Visual fields grossly intact. EOMI and PERRLA. No nystagmus noted. Facial sensation intact at forehead, maxillary cheek, and chin/mandible bilaterally.  Left-sided facial weakness, unable to completely close left eye, and droop noted at corner of mouth, patient does have some movement over the left forehead but it is weaker than the right side. Hearing grossly normal. Uvula is midline, and palate elevates symmetrically. Normal SCM and trapezius strength. Tongue midline without fasciculations. Motor: Muscle strength 5/5 in proximal and distal UE and LE bilaterally. No pronator drift. Muscle tone normal. Sensation: Intact to light touch in upper and lower extremities distally bilaterally.  Gait: Normal without ataxia.  Presentation most concerning for Bell's palsy, although given patient does have some movement of the left side of the forehead, and also reports some subjective numbness cannot rule out stroke, patient is outside of the window for code stroke.  Labs and CT head ordered from  triage.  Initiation of care has begun. The patient has been counseled on the process, plan, and necessity for staying for the completion/evaluation, and the remainder of the medical screening examination   Janet Berlin 11/24/17 Caban, Kevin, MD 11/24/17 2340

## 2017-11-24 NOTE — ED Notes (Signed)
Pt presents with left sided facial droop, numbness to left side of mouth. States yesterday his left eye was watering more than usual and today he is unable to close left eye all the way.

## 2017-11-24 NOTE — Telephone Encounter (Signed)
Pt calling with complaints of upper lip numbness on the left side of lip since approximately 10 am this morning. Pt states that he noticed it while drinking coffee this morning and could not feel the heat from the coffee.Pt states he can hardly hold anything in his mouth. Pt states that he can not tell of any numbness to any other area on the face, arm or leg at this time. Pt denies any chest pain, headache or dizziness.Pt also notes that when he closes both eyes he is unable to open his right eye unless he holds his left eye. Pt advised to seek treatment in the ED but the pt does not want to go to the ED and wants to know if he could be worked in by Dr. Damita Dunnings. Attempted to call flow coordinator x2 but no answer at this time. Notified pt that Dr. Damita Dunnings would be notified of triage encounter, but the recommendation at this time would be to go to ED. Pt verbalized understanding. Reason for Disposition . Bell's palsy suspected (i.e., weakness on only one side of the face, developing over hours to days, no other symptoms)  Answer Assessment - Initial Assessment Questions 1. SYMPTOM: "What is the main symptom you are concerned about?" (e.g., weakness, numbness)     Numbness to upper lip around about 10am when drinking cofee 2. ONSET: "When did this start?" (minutes, hours, days; while sleeping)     About 10 3. LAST NORMAL: "When was the last time you were normal (no symptoms)?"     Last night, can't hardly drink water out of bottle 4. PATTERN "Does this come and go, or has it been constant since it started?"  "Is it present now?"     constant 5. CARDIAC SYMPTOMS: "Have you had any of the following symptoms: chest pain, difficulty breathing, palpitations?"     Not really, but has been tired and lethargic with allergies 6. NEUROLOGIC SYMPTOMS: "Have you had any of the following symptoms: headache, dizziness, vision loss, double vision, changes in speech, unsteady on your feet?"     Yesterday had eye  watering 7. OTHER SYMPTOMS: "Do you have any other symptoms?"     No 8. PREGNANCY: "Is there any chance you are pregnant?" "When was your last menstrual period?"     N/a  Protocols used: NEUROLOGIC DEFICIT-A-AH

## 2017-11-24 NOTE — ED Triage Notes (Signed)
Patient here from home for new onset numbness on the left lip and face.  Went to bed at midnight and was normal.  Woke up today and could not feel the hot coffee.  No other complaints.  A&Ox4.

## 2017-11-25 ENCOUNTER — Telehealth: Payer: Self-pay | Admitting: Family Medicine

## 2017-11-25 NOTE — Telephone Encounter (Signed)
Pt was seen in ED yesterday, 11/24/17, and was prescribed Isopto Tears/ Goniovisc but [facility] states they discontinued this. Pt is wondering if PCP can write a prescription for a substitute and it be sent to [facility] to be mailed to him. Pt can be reached at [phone]

## 2017-11-25 NOTE — Telephone Encounter (Signed)
Noted. Thanks.

## 2017-11-26 MED ORDER — POLYETHYL GLYCOL-PROPYL GLYCOL 0.4-0.3 % OP SOLN: 1.0000 [drp] | OPHTHALMIC | 1 refills | Status: DC | PRN

## 2017-11-26 NOTE — Telephone Encounter (Signed)
Patient notified as instructed by telephone and verbalized understanding. Patient stated that he was given a bottle of the Isopto Tears/Goniovisc at the ED along with a script. Patient stated that he has used artifical tears for at least two years. Patient stated that the eye drops given to him at the ED are much thicker than any artifical tears that he has ever used. Patient stated that [facility] told him that they are not able to get the eye drops prescribed by the ED doctor. Patient stated that he wants something comparable to the drops given to him at ED because they are so much thicker and seems to work better.  Patient stated that he is has probably at lease two weeks worth of drops left in the bottle given to him at the ED. Patient stated that he would like an eye drop sent to [facility] for something similar to the eye drops given him to the ED. Patient stated that he also uses Cromolyn eye drops prescribed by his ophthalmologist.

## 2017-11-26 NOTE — Telephone Encounter (Signed)
Patient notified as instructed by telephone and verbalized understanding. 

## 2017-11-26 NOTE — Telephone Encounter (Signed)
He could try systane.  rx sent.   He can get that (or another artificial tear solution) OTC too.

## 2017-11-26 NOTE — Telephone Encounter (Signed)
He could try systane.  rx sent.  that should be similar/reasonable, based on reports I've gotten from patients.  He should be able to get systane or another thicker solution OTC.

## 2017-11-27 NOTE — Telephone Encounter (Signed)
Pt is asking to have RX sent to [facility] instead of [facility] so he can pick it up instead of having to wait for it to be shipped.

## 2017-11-28 ENCOUNTER — Ambulatory Visit (INDEPENDENT_AMBULATORY_CARE_PROVIDER_SITE_OTHER): Payer: Medicare HMO | Admitting: Family Medicine

## 2017-11-28 ENCOUNTER — Encounter: Payer: Self-pay | Admitting: Family Medicine

## 2017-11-28 DIAGNOSIS — G510 Bell's palsy: Secondary | ICD-10-CM | POA: Diagnosis not present

## 2017-11-28 MED ORDER — POLYETHYL GLYCOL-PROPYL GLYCOL 0.4-0.3 % OP SOLN: 1.0000 [drp] | OPHTHALMIC | 1 refills | Status: DC | PRN

## 2017-11-28 NOTE — Addendum Note (Signed)
Addended by: Tonia Ghent on: 11/28/2017 06:40 AM   Modules accepted: Orders

## 2017-11-28 NOTE — Patient Instructions (Signed)
If the eye drop isn't working well, then check with the pharmacy to see what they have in stock that may work well.  Take care.  Glad to see you.

## 2017-11-28 NOTE — Telephone Encounter (Signed)
Sent. Thanks.   

## 2017-11-28 NOTE — Progress Notes (Signed)
Patient developed left-sided facial droop and was advised to go to the emergency room.  Emergency room notes and course and work-up reviewed with patient.  Diagnosed with left-sided Bell's palsy.  Started on prednisone and antivirals in the meantime.  No new neurologic symptoms.  Here for follow-up.  He had called in asking about changes to his eyedrops.  These were previously sent in.  Discussed.  Discussed with him about protecting his eye in general.  He is been wearing sunglasses in the meantime and that has been beneficial.  He has not noticed significant improvement in the meantime but this is very early in the process, discussed with patient.  Meds, vitals, and allergies reviewed.   ROS: Per HPI unless specifically indicated in ROS section   GEN: nad, alert and oriented HEENT: mucous membranes moist NECK: supple w/o LA CV: rrr. PULM: ctab, no inc wob SKIN: well perfused.   CN 2-12 wnl B  except for left facial droop noted.  Sensation is still grossly intact on the face. Strength and sensation grossly intact with extremities.

## 2017-11-29 DIAGNOSIS — G510 Bell's palsy: Secondary | ICD-10-CM | POA: Insufficient documentation

## 2017-11-29 NOTE — Assessment & Plan Note (Signed)
He does have a left-sided facial droop.  When he blinks and squints hard he is able to nearly fully close the left upper and lower eyelids.  He feels better and sunglasses.  He has reasonable eyedrops to use.  He can try various forms of artificial tears to see which ones work best for him.  We can send in a new prescription if needed.  We talked about routine cautions with chewing.  He is using a straw, etc.  We talked about the pathophysiology of Bell's palsy.  He has isolated left-sided symptoms.  We talked about the cranial nerve anatomy.  Okay for outpatient follow-up.  I hope that he will have complete resolution but the course of Bell's palsy is highly variable.  We do not always know the source of the condition.  It is reasonable to finish his antivirals and prednisone in the meantime.  All questions answered to the best of my ability. >25 minutes spent in face to face time with patient, >50% spent in counselling or coordination of care.

## 2017-12-04 NOTE — ED Provider Notes (Signed)
[facility] EMERGENCY DEPARTMENT Provider Note   CSN: [id number] Arrival date & time: 11/24/17  1509     History   Chief Complaint Chief Complaint  Patient presents with  . Numbness    LNW 0000 11/24/17    HPI Richard Saunders is a 77 y.o. male.  HPI Patient is a 77 year old male presents the emergency department because of acute onset left-sided facial numbness and left-sided facial weakness.  He went to bed at approximately midnight and when he awoke this morning was attempting to drink coffee felt like coffee was running at the left side of his mouth.  He noted left-sided facial droop.  No weakness of his arms or legs.  No history of stroke.  Denies difficulty with his speech.  Continues to have watery left eye.  Symptoms are moderate in severity.   Past Medical History:  Diagnosis Date  . Allergy   . Anxiety   . Depression   . H/O: rheumatic fever 2012   (per patient) - eval by [facility] LBB[facility] with PVC's  . Heart block bundle branch   . History of chicken pox   . Hypertension   . Organic impotence     Patient Active Problem List   Diagnosis Date Noted  . Bell's palsy 11/29/2017  . Healthcare maintenance 10/28/2017  . Advance care planning 10/28/2017  . Left ventricular hypertrophy 06/10/2013  . Gout 08/04/2012  . Left bundle branch block 06/23/2010  . ANXIETY DEPRESSION 06/07/2010  . RHEUMATIC FEVER 06/07/2010  . Premature ventricular contractions 06/07/2010  . ALLERGIC RHINITIS 06/07/2010  . 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 Aortic regurgitation 06/07/2010  . CHICKENPOX, HX OF 06/07/2010    Past Surgical History:  Procedure Laterality Date  . Cautery for nosebleeds    . Right elbow bursa removed    . TONSILLECTOMY AND ADENOIDECTOMY  1947        Home Medications    Prior to Admission medications   Medication Sig Start Date End Date Taking? Authorizing Provider  acetaminophen (TYLENOL) 500 MG tablet Take 500 mg by mouth every 6  (six) hours as needed for headache (pain).   Yes [provider]  ALPRAZolam (XANAX) 0.25 MG tablet Take 0.5 tablets (0.125 mg total) by mouth daily as needed for anxiety. 10/27/17  Yes Tonia Ghent, MD  Artificial Tear Solution (SOOTHE XP OP) Place 1 drop into both eyes 2 (two) times daily.   Yes [provider]  aspirin EC 81 MG tablet Take 1 tablet (81 mg total) by mouth daily. 07/03/15  Yes Belva Crome, MD  cromolyn (OPTICROM) 4 % ophthalmic solution Place 1 drop into both eyes 2 (two) times daily.  05/31/15  Yes [provider]  Guaifenesin 1200 MG TB12 Take 1,200 mg by mouth daily as needed for cough or to loosen phlegm.    Yes [provider]  hydrocortisone cream 1 % Apply 1 application topically 2 (two) times daily as needed for itching.   Yes [provider]  metoprolol succinate (TOPROL-XL) 25 MG 24 hr tablet TAKE 1/2 TABLET (12.5 MG) BY MOUTH DAILY Patient taking differently: Take 12.5 mg by mouth daily.  10/14/17  Yes Belva Crome, MD  naproxen sodium (ALEVE) 220 MG tablet Take 220 mg by mouth 2 (two) times daily as needed (pain).   Yes [provider]  polyethylene glycol powder (GLYCOLAX/MIRALAX) powder Take 17 g by mouth 2 (two) times daily as needed. Patient taking differently: Take 17  g by mouth 2 (two) times daily as needed (constipation).  08/06/16  Yes Tonia Ghent, MD  Polyethyl Glycol-Propyl Glycol (SYSTANE) 0.4-0.3 % SOLN Apply 1 drop to eye as needed. 11/28/17   Tonia Ghent, MD    Family History Family History  Problem Relation Age of Onset  . Cancer Mother        ovarian, uterine  . Aneurysm Mother        AAA in 70  . Cancer Father        Prostate  . COPD Father   . Prostate cancer Father   . Diabetes Other   . Heart disease Other        CAD <60 male  . Hypertension Other   . Colon cancer Neg Hx     Social History Social History   Tobacco Use  . Smoking status: Former Research scientist (life sciences)  . Smokeless  tobacco: Never Used  Substance Use Topics  . Alcohol use: Yes    Alcohol/week: 0.0 standard drinks    Comment: Rare  . Drug use: No     Allergies   Colchicine and Fluticasone propionate   Review of Systems Review of Systems  All other systems reviewed and are negative.    Physical Exam Updated Vital Signs BP (!) 164/76   Pulse (!) 53   Temp 98 F (36.7 C) (Oral)   Resp 15   SpO2 97%   Physical Exam  Constitutional: He is oriented to person, place, and time. He appears well-developed and well-nourished.  HENT:  Head: Normocephalic and atraumatic.  Eyes: Pupils are equal, round, and reactive to light. EOM are normal.  Neck: Normal range of motion.  Cardiovascular: Normal rate, regular rhythm and intact distal pulses.  Pulmonary/Chest: Effort normal and breath sounds normal. No respiratory distress.  Abdominal: Soft. He exhibits no distension. There is no tenderness.  Musculoskeletal: Normal range of motion.  Neurological: He is alert and oriented to person, place, and time.  Left-sided facial weakness.  Mild weakness of the left forehead. 5/5 strength in major muscle groups of  bilateral upper and lower extremities. Speech normal.   Skin: Skin is warm and dry.  Nursing note and vitals reviewed.    ED Treatments / Results  Labs (all labs ordered are listed, but only abnormal results are displayed) Labs Reviewed  CBC - Abnormal; Notable for the following components:      Result Value   Platelets 147 (*)    All other components within normal limits  COMPREHENSIVE METABOLIC PANEL - Abnormal; Notable for the following components:   Glucose, Bld 106 (*)    AST 43 (*)    Total Bilirubin 2.4 (*)    All other components within normal limits  PROTIME-INR  APTT  DIFFERENTIAL  I-STAT TROPONIN, ED  CBG MONITORING, ED    EKG EKG Interpretation  Date/Time:  Monday November 24 2017 15:47:40 EDT Ventricular Rate:  63 PR Interval:  240 QRS Duration: 90 QT  Interval:  418 QTC Calculation: 427 R Axis:   -18 Text Interpretation:  Sinus rhythm with 1st degree A-V block with frequent and consecutive Premature ventricular complexes Left ventricular hypertrophy with repolarization abnormality Abnormal ECG No old tracing to compare Confirmed by Isla Pence [phone]) on 11/25/2017 3:57:26 PM   Radiology No results found.  Procedures Procedures (including critical care time)  Medications Ordered in ED Medications - No data to display   Initial Impression / Assessment and Plan / ED Course  I have  reviewed the triage vital signs and the nursing notes.  Pertinent labs & imaging results that were available during my care of the patient were reviewed by me and considered in my medical decision making (see chart for details).     Initial evaluation he had mild weakness of the left forehead but it was not very prominent.  Given this concern MRI was obtained which demonstrates no acute infarct.  This is likely left sided Bell's palsy.  Home with standard medications.  Instructed for ongoing lubrication of the left eye with artificial tears.  Primary care follow-up.  Patient encouraged to return to the ER for new or worsening symptoms  I personally reviewed the patient's MRI   Final Clinical Impressions(s) / ED Diagnoses   Final diagnoses:  Bell's palsy    ED Discharge Orders         Ordered    predniSONE (DELTASONE) 20 MG tablet  Daily,   Status:  Discontinued     11/24/17 2230    valACYclovir (VALTREX) 1000 MG tablet  2 times daily,   Status:  Discontinued     11/24/17 2230    hydroxypropyl methylcellulose / hypromellose (ISOPTO TEARS / GONIOVISC) 2.5 % ophthalmic solution  4 times daily PRN,   Status:  Discontinued     11/24/17 2230    hydroxypropyl methylcellulose / hypromellose (ISOPTO TEARS / GONIOVISC) 2.5 % ophthalmic solution  4 times daily PRN,   Status:  Discontinued     11/24/17 2233    valACYclovir (VALTREX) 1000 MG tablet  2  times daily     11/24/17 2233    predniSONE (DELTASONE) 20 MG tablet  Daily     11/24/17 2233           Jola Schmidt, MD 12/04/17 [phone]

## 2017-12-10 ENCOUNTER — Ambulatory Visit: Payer: Medicare HMO | Admitting: Interventional Cardiology

## 2017-12-21 ENCOUNTER — Encounter (HOSPITAL_COMMUNITY): Payer: Self-pay

## 2017-12-21 ENCOUNTER — Emergency Department (HOSPITAL_COMMUNITY): Payer: Medicare HMO

## 2017-12-21 ENCOUNTER — Emergency Department (HOSPITAL_COMMUNITY)
Admission: EM | Admit: 2017-12-21 | Discharge: 2017-12-21 | Disposition: A | Discharge status: Home / Self Care | Payer: Medicare HMO | Attending: Emergency Medicine | Admitting: Emergency Medicine

## 2017-12-21 DIAGNOSIS — M546 Pain in thoracic spine: Secondary | ICD-10-CM | POA: Diagnosis not present

## 2017-12-21 DIAGNOSIS — Y999 Unspecified external cause status: Secondary | ICD-10-CM | POA: Insufficient documentation

## 2017-12-21 DIAGNOSIS — W010XXA Fall on same level from slipping, tripping and stumbling without subsequent striking against object, initial encounter: Secondary | ICD-10-CM | POA: Insufficient documentation

## 2017-12-21 DIAGNOSIS — S299XXA Unspecified injury of thorax, initial encounter: Secondary | ICD-10-CM | POA: Diagnosis not present

## 2017-12-21 DIAGNOSIS — Y939 Activity, unspecified: Secondary | ICD-10-CM | POA: Insufficient documentation

## 2017-12-21 DIAGNOSIS — S29012A Strain of muscle and tendon of back wall of thorax, initial encounter: Secondary | ICD-10-CM | POA: Diagnosis not present

## 2017-12-21 DIAGNOSIS — Y92480 Sidewalk as the place of occurrence of the external cause: Secondary | ICD-10-CM | POA: Diagnosis not present

## 2017-12-21 DIAGNOSIS — I10 Essential (primary) hypertension: Secondary | ICD-10-CM | POA: Diagnosis not present

## 2017-12-21 DIAGNOSIS — Z79899 Other long term (current) drug therapy: Secondary | ICD-10-CM | POA: Insufficient documentation

## 2017-12-21 DIAGNOSIS — M549 Dorsalgia, unspecified: Secondary | ICD-10-CM

## 2017-12-21 DIAGNOSIS — Z23 Encounter for immunization: Secondary | ICD-10-CM | POA: Insufficient documentation

## 2017-12-21 DIAGNOSIS — S0001XA Abrasion of scalp, initial encounter: Secondary | ICD-10-CM | POA: Diagnosis not present

## 2017-12-21 DIAGNOSIS — S199XXA Unspecified injury of neck, initial encounter: Secondary | ICD-10-CM | POA: Diagnosis not present

## 2017-12-21 DIAGNOSIS — Z7982 Long term (current) use of aspirin: Secondary | ICD-10-CM | POA: Diagnosis not present

## 2017-12-21 DIAGNOSIS — Z87891 Personal history of nicotine dependence: Secondary | ICD-10-CM | POA: Insufficient documentation

## 2017-12-21 DIAGNOSIS — S29019A Strain of muscle and tendon of unspecified wall of thorax, initial encounter: Secondary | ICD-10-CM

## 2017-12-21 DIAGNOSIS — S0990XA Unspecified injury of head, initial encounter: Secondary | ICD-10-CM | POA: Diagnosis present

## 2017-12-21 MED ORDER — IBUPROFEN 400 MG PO TABS
400.0000 mg | ORAL_TABLET | Freq: Once | ORAL | Status: AC
Start: 2017-12-21 — End: 2017-12-21
  Administered 2017-12-21: 400 mg via ORAL
  Filled 2017-12-21: qty 1

## 2017-12-21 MED ORDER — METHOCARBAMOL 500 MG PO TABS
750.0000 mg | ORAL_TABLET | Freq: Once | ORAL | Status: DC
  Filled 2017-12-21: qty 2

## 2017-12-21 MED ORDER — TRAMADOL HCL 50 MG PO TABS: 50.0000 mg | ORAL_TABLET | Freq: Four times a day (QID) | ORAL | 0 refills | Status: DC | PRN

## 2017-12-21 MED ORDER — TETANUS-DIPHTH-ACELL PERTUSSIS 5-2.5-18.5 LF-MCG/0.5 IM SUSP
0.5000 mL | Freq: Once | INTRAMUSCULAR | Status: AC
  Administered 2017-12-21: 0.5 mL via INTRAMUSCULAR
  Filled 2017-12-21: qty 0.5

## 2017-12-21 NOTE — Discharge Instructions (Signed)
It was our pleasure to provide your ER care today - we hope that you feel better.  Take acetaminophen and/or ibuprofen as need for pain.   You may also take ultram as need for pain - no driving when taking.  Keep abrasions very clean. Wash with warm water and soap 1-2 times per day.   Return to ER if worse, new symptoms, new or severe pain, infection of wounds, other concern.

## 2017-12-21 NOTE — ED Notes (Signed)
PT states understanding of care given, follow up care, and medication prescribed. Pt has no more questions at this time.  PT  ambulated from ED to car with a steady gait.

## 2017-12-21 NOTE — ED Triage Notes (Signed)
Onset yesterday pt tripped over curb, fell forward hitting top of head into brick wall.  Abrasion noted x 2 to top of head, bleeding controlled.  Pt c/o pain to upper thoracic area of back when moving arms and laying down.  No LOC.  [place]  Pt continued on trip and came back 30 minutes PTA.

## 2017-12-21 NOTE — ED Provider Notes (Signed)
[facility] EMERGENCY DEPARTMENT Provider Note   CSN: [id number] Arrival date & time: 12/21/17  1746     History   Chief Complaint Chief Complaint  Patient presents with  . Back Pain  . Abrasion  . Head Injury    HPI Richard Saunders is a 77 y.o. male.  Patient s/p fall yesterday around 2 PM. Foot got caught on curb, fell forward. Hit top of scalp - abrasion/lac to area. Pt c/o lower cervical/upper thoracic area pain. Pain dull, moderate, constant, non radiating. No loc. No headaches. No nausea or vomiting. No radicular pain. No numbness/weakness. Last tetanus unknown. Has been ambulatory since. Did not seek tx at time of injury. Denies other pain or injury.   The history is provided by the patient.  Back Pain   Pertinent negatives include no chest pain, no fever, no numbness, no headaches, no abdominal pain and no weakness.  Head Injury   Pertinent negatives include no numbness, no vomiting and no weakness.    Past Medical History:  Diagnosis Date  . Allergy   . Anxiety   . Depression   . H/O: rheumatic fever 2012   (per patient) - eval by [facility] with PVC's  . Heart block bundle branch   . History of chicken pox   . Hypertension   . Organic impotence     Patient Active Problem List   Diagnosis Date Noted  . Bell's palsy 11/29/2017  . Healthcare maintenance 10/28/2017  . Advance care planning 10/28/2017  . Left ventricular hypertrophy 06/10/2013  . Gout 08/04/2012  . Left bundle branch block 06/23/2010  . ANXIETY DEPRESSION 06/07/2010  . RHEUMATIC FEVER 06/07/2010  . Premature ventricular contractions 06/07/2010  . ALLERGIC RHINITIS 06/07/2010  . 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 Aortic regurgitation 06/07/2010  . CHICKENPOX, HX OF 06/07/2010    Past Surgical History:  Procedure Laterality Date  . Cautery for nosebleeds    . Right elbow bursa removed    . TONSILLECTOMY AND ADENOIDECTOMY  1947        Home Medications     Prior to Admission medications   Medication Sig Start Date End Date Taking? Authorizing Provider  acetaminophen (TYLENOL) 500 MG tablet Take 500 mg by mouth every 6 (six) hours as needed for headache (pain).    [provider]  ALPRAZolam Duanne Moron) 0.25 MG tablet Take 0.5 tablets (0.125 mg total) by mouth daily as needed for anxiety. 10/27/17   Tonia Ghent, MD  Artificial Tear Solution (SOOTHE XP OP) Place 1 drop into both eyes 2 (two) times daily.    [provider]  aspirin EC 81 MG tablet Take 1 tablet (81 mg total) by mouth daily. 07/03/15   Belva Crome, MD  cromolyn (OPTICROM) 4 % ophthalmic solution Place 1 drop into both eyes 2 (two) times daily.  05/31/15   [provider]  Guaifenesin 1200 MG TB12 Take 1,200 mg by mouth daily as needed for cough or to loosen phlegm.     [provider]  hydrocortisone cream 1 % Apply 1 application topically 2 (two) times daily as needed for itching.    [provider]  metoprolol succinate (TOPROL-XL) 25 MG 24 hr tablet TAKE 1/2 TABLET (12.5 MG) BY MOUTH DAILY Patient taking differently: Take 12.5 mg by mouth daily.  10/14/17   Belva Crome, MD  naproxen sodium (ALEVE) 220 MG tablet Take 220 mg by mouth 2 (two) times daily as needed (pain).  [provider]  Polyethyl Glycol-Propyl Glycol (SYSTANE) 0.4-0.3 % SOLN Apply 1 drop to eye as needed. 11/28/17   Tonia Ghent, MD  polyethylene glycol powder (GLYCOLAX/MIRALAX) powder Take 17 g by mouth 2 (two) times daily as needed. Patient taking differently: Take 17 g by mouth 2 (two) times daily as needed (constipation).  08/06/16   Tonia Ghent, MD    Family History Family History  Problem Relation Age of Onset  . Cancer Mother        ovarian, uterine  . Aneurysm Mother        AAA in 72  . Cancer Father        Prostate  . COPD Father   . Prostate cancer Father   . Diabetes Other   . Heart disease Other        CAD <60 male  .  Hypertension Other   . Colon cancer Neg Hx     Social History Social History   Tobacco Use  . Smoking status: Former Research scientist (life sciences)  . Smokeless tobacco: Never Used  Substance Use Topics  . Alcohol use: Yes    Alcohol/week: 0.0 standard drinks    Comment: Rare  . Drug use: No     Allergies   Colchicine and Fluticasone propionate   Review of Systems Review of Systems  Constitutional: Negative for fever.  HENT: Negative for sore throat.   Eyes: Negative for visual disturbance.  Respiratory: Negative for shortness of breath.   Cardiovascular: Negative for chest pain.  Gastrointestinal: Negative for abdominal pain and vomiting.  Genitourinary: Negative for flank pain.  Musculoskeletal: Positive for back pain.  Skin: Positive for wound.  Neurological: Negative for speech difficulty, weakness, numbness and headaches.  Hematological: Does not bruise/bleed easily.  Psychiatric/Behavioral: Negative for confusion.     Physical Exam Updated Vital Signs BP (!) 141/85 (BP Location: Right Arm)   Pulse 61   Temp 98.8 F (37.1 C) (Oral)   Resp 17   SpO2 95%   Physical Exam  Constitutional: He is oriented to person, place, and time. He appears well-developed and well-nourished.  HENT:  Mouth/Throat: Oropharynx is clear and moist.  Abrasion/lac, dried blood to superior aspect scalp.   Eyes: Pupils are equal, round, and reactive to light. Conjunctivae are normal.  Neck: Neck supple. No tracheal deviation present.  Cardiovascular: Normal rate, regular rhythm, normal heart sounds and intact distal pulses.  Pulmonary/Chest: Effort normal and breath sounds normal. No accessory muscle usage. No respiratory distress.  Abdominal: Soft. Bowel sounds are normal. He exhibits no distension. There is no tenderness.  Genitourinary:  Genitourinary Comments: No cva tenderness  Musculoskeletal: He exhibits no edema.  Lower cervical and upper thoracic tenderness, otherwise, CTLS spine, non tender,  aligned, no step off. Good rom bil extremities, no focal bony tenderness noted.   Neurological: He is alert and oriented to person, place, and time.  Speech normal/fluent. Motor/sens grossly intact bil. Steady gait.   Skin: Skin is warm and dry.  Psychiatric: He has a normal mood and affect.  Nursing note and vitals reviewed.    ED Treatments / Results  Labs (all labs ordered are listed, but only abnormal results are displayed) Labs Reviewed - No data to display  EKG None  Radiology Dg Thoracic Spine 2 View  Result Date: 12/21/2017 CLINICAL DATA:  Tripped over curb yesterday and fell forward, upper thoracic pain EXAM: THORACIC SPINE 2 VIEWS COMPARISON:  Chest radiographs 08/29/2016 FINDINGS: Twelve pairs of ribs. Broad-based dextroconvex  scoliosis upper thoracic spine. Vertebral body heights maintained. No acute fracture, subluxation, or bone destruction. IMPRESSION: Scoliosis. No acute abnormalities. Electronically Signed   By: Lavonia Dana M.D.   On: 12/21/2017 19:52   Ct Cervical Spine Wo Contrast  Result Date: 12/21/2017 CLINICAL DATA:  Fall.  Pain between shoulder blades. EXAM: CT CERVICAL SPINE WITHOUT CONTRAST TECHNIQUE: Multidetector CT imaging of the cervical spine was performed without intravenous contrast. Multiplanar CT image reconstructions were also generated. COMPARISON:  None. FINDINGS: Alignment: No subluxation Skull base and vertebrae: No acute fracture. No primary bone lesion or focal pathologic process. Soft tissues and spinal canal: No prevertebral fluid or swelling. No visible canal hematoma. Disc levels: Diffuse degenerative disc disease. Mild diffuse degenerative facet disease bilaterally. Upper chest: No acute findings Other: Carotid artery calcifications.  No acute findings. 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 No acute bony abnormality. Electronically Signed   By: Rolm Baptise M.D.   On: 12/21/2017 20:18     Procedures Procedures (including critical care time)  Medications Ordered in ED Medications  Tdap (BOOSTRIX) injection 0.5 mL (has no administration in time range)     Initial Impression / Assessment and Plan / ED Course  I have reviewed the triage vital signs and the nursing notes.  Pertinent labs & imaging results that were available during my care of the patient were reviewed by me and considered in my medical decision making (see chart for details).  Imaging ordered.   Scalp wounds cleaned.   Reviewed nursing notes and prior charts for additional history.   Tetanus im.   Imaging studies reviewed - no acute fracture.   Abrasion cleaned, bacitracin/dressing.     Final Clinical Impressions(s) / ED Diagnoses   Final diagnoses:  None    ED Discharge Orders    None       Lajean Saver, MD 12/21/17 2026

## 2018-01-27 ENCOUNTER — Ambulatory Visit (INDEPENDENT_AMBULATORY_CARE_PROVIDER_SITE_OTHER): Payer: Medicare HMO | Admitting: Family Medicine

## 2018-01-27 ENCOUNTER — Encounter: Payer: Self-pay | Admitting: Family Medicine

## 2018-01-27 VITALS — BP 132/78 | HR 63 | Temp 98.3°F | Ht 73.0 in | Wt 224.2 lb

## 2018-01-27 DIAGNOSIS — Z23 Encounter for immunization: Secondary | ICD-10-CM

## 2018-01-27 DIAGNOSIS — M542 Cervicalgia: Secondary | ICD-10-CM | POA: Diagnosis not present

## 2018-01-27 DIAGNOSIS — R059 Cough, unspecified: Secondary | ICD-10-CM

## 2018-01-27 DIAGNOSIS — R05 Cough: Secondary | ICD-10-CM | POA: Diagnosis not present

## 2018-01-27 DIAGNOSIS — G510 Bell's palsy: Secondary | ICD-10-CM | POA: Diagnosis not present

## 2018-01-27 MED ORDER — METOPROLOL SUCCINATE ER 25 MG PO TB24: 12.5000 mg | ORAL_TABLET | Freq: Every day | ORAL | Status: DC

## 2018-01-27 MED ORDER — ALPRAZOLAM 0.25 MG PO TABS: 0.1250 mg | ORAL_TABLET | Freq: Every day | ORAL | Status: AC | PRN

## 2018-01-27 MED ORDER — POLYETHYLENE GLYCOL 3350 17 GM/SCOOP PO POWD: 17.0000 g | Freq: Two times a day (BID) | ORAL | Status: DC | PRN

## 2018-01-27 NOTE — Progress Notes (Signed)
Rare use of  BZD.  D/w pt.  No ADE on med.   ER notes d/w pt.  He tripped and fell into a wall.  No LOC. Scalp lac noted.  Seen at ER.    CT with degenerative disc and facet disease diffusely throughout the cervical spine. No acute bony abnormality.  T spine with scoliosis. No acute abnormalities.  He was diffusely sore after the event.  He had some tightness in the neck.  He had a HA and used a heating pad with some relief.  He has some neck and upper back pain, tried heat locally but that made the pain worse.  He used tramadol after the fact with some relief, slep better with tramadol.    He feels better today.    No radicular sx and no weakness.  No numbness.    Also- he is back on mucinex for allergies/cough.  He failed tx with flonase.  D/w pt about trial of nasonex for nasal congestion/sx as needed.    Due for flu shot.  D/w pt.    Facial strength is back to normal in the meantime.  No trouble chewing, etd.  He can whistle now- that is an improvement.   Meds, vitals, and allergies reviewed.   ROS: Per HPI unless specifically indicated in ROS section   GEN: nad, alert and oriented, ncat HEENT: mucous membranes moist, normal B smile. Nasal exam slightly stuffy, OP wnl- no exudates.  NECK: supple w/o LA, normal ROM, no midline pain.  CV: rrr PULM: ctab, no inc wob ABD: soft, +bs EXT: no edema S/S grossly wnl BUE and able to bear weight, gait wnl.

## 2018-01-27 NOTE — Assessment & Plan Note (Addendum)
Likely OA flare after fall, dw pt.  Would use ice, not heat.  Can use tramadol as needed, sedation caution.  He agrees. No radicular sx.

## 2018-01-27 NOTE — Assessment & Plan Note (Signed)
No residual facial weakness noted.  D/w pt.

## 2018-01-27 NOTE — Patient Instructions (Addendum)
Be careful.   Update me as needed.   Use ice on your neck as needed.  Try OTC nasonex.  See if you can tolerate that.   Take care.  Glad to see you.

## 2018-01-27 NOTE — Assessment & Plan Note (Signed)
ctab w/o wheeze.  Can continue mucinex and can try nasonex if needed- he didn't tolerate the smell/taste of flonase (not an allergy to flonase).  Update me as needed.  He agrees.

## 2018-04-30 ENCOUNTER — Other Ambulatory Visit: Payer: Self-pay | Admitting: Family Medicine

## 2018-04-30 ENCOUNTER — Telehealth: Payer: Self-pay | Admitting: *Deleted

## 2018-04-30 ENCOUNTER — Ambulatory Visit (INDEPENDENT_AMBULATORY_CARE_PROVIDER_SITE_OTHER): Payer: Medicare HMO | Admitting: Family Medicine

## 2018-04-30 ENCOUNTER — Encounter: Payer: Self-pay | Admitting: Family Medicine

## 2018-04-30 DIAGNOSIS — R05 Cough: Secondary | ICD-10-CM

## 2018-04-30 DIAGNOSIS — M720 Palmar fascial fibromatosis [Dupuytren]: Secondary | ICD-10-CM

## 2018-04-30 DIAGNOSIS — R059 Cough, unspecified: Secondary | ICD-10-CM

## 2018-04-30 MED ORDER — MOMETASONE FUROATE 50 MCG/ACT NA SUSP: 2.0000 | Freq: Every day | NASAL | Status: DC

## 2018-04-30 MED ORDER — MOMETASONE FUROATE 50 MCG/ACT NA SUSP: 2.0000 | Freq: Every day | NASAL | 1 refills | Status: DC

## 2018-04-30 NOTE — Progress Notes (Signed)
He has some residual neck soreness but "nothing like it was."    CT with prev degenerative disc and facet disease diffusely throughout the cervical spine. No acute bony abnormality.  He didn't tolerate flonase.  He has h/o cough.  He tried mucinex.   Nasal steroid cautions d/w pt.  Ocular disease cautions discussed with patient: Use with caution in patients with cataracts and/or glaucoma; increased intraocular pressure, glaucoma, and cataracts have occurred with prolonged use. Consider routine eye exams in chronic users or in patients who report visual changes.  It would likely be that he could tolerate relatively short-term use as a low risk exposure.  Cough, with mucous in the throat.  When he gets it out, he improves but then sx return later.  mucinex helps some, with increased clearance.  The cough doesn't seem to originate in the chest.    Meds, vitals, and allergies reviewed.   ROS: Per HPI unless specifically indicated in ROS section   nad ncat TM wnl Nasal exam stuffy Oropharynx without erythema, mucous membranes moist Neck supple, no lymphadenopathy ctab No UAN, no stridor rrr Minimal L 4th finger contracture.

## 2018-04-30 NOTE — Patient Instructions (Addendum)
Try nasonex for about 2 weeks and see if you notice a change.  Update me either way.  Take care.  Glad to see you.

## 2018-04-30 NOTE — Telephone Encounter (Signed)
Note from pharmacy says Flunisolide not covered.

## 2018-04-30 NOTE — Telephone Encounter (Signed)
Spoke to Jolley at [facility] who states pts d/c summary states he is to get Nasonex OTC, but Vicente Males states it is one of the few, that still require a Rx. Pt is either needing a new Rx sent or a suggestion of what other nasal spray Dr Damita Dunnings would like him to try. pls advise

## 2018-04-30 NOTE — Telephone Encounter (Signed)
I apologize.  rx sent.  Thanks.

## 2018-05-01 ENCOUNTER — Other Ambulatory Visit: Payer: Self-pay | Admitting: Family Medicine

## 2018-05-01 NOTE — Telephone Encounter (Signed)
Patient says he will check with the pharmacy and see if it is ready for pickup.

## 2018-05-01 NOTE — Telephone Encounter (Signed)
Have him try the nasonex rx (prev sent).  Let me know if that isn't covered and/or if I need to change that.  Thanks.

## 2018-05-01 NOTE — Telephone Encounter (Signed)
Spoke to [facility] pharmacy who states Nasonex is not covered by pts insurance. Per Dr Damita Dunnings, ok for pt to obtain Nasocort OTC. Pharmacist will advise pt

## 2018-05-01 NOTE — Telephone Encounter (Signed)
I sent rx for mometasone yesterday.  Was the patient able to fill that?  Did the insurance cover that?  Please let me know.  Thanks.

## 2018-05-03 DIAGNOSIS — M720 Palmar fascial fibromatosis [Dupuytren]: Secondary | ICD-10-CM | POA: Insufficient documentation

## 2018-05-03 NOTE — Assessment & Plan Note (Signed)
He can try Nasonex for 2 weeks and see if that has an effect on the cough.  He should be able to tolerate that for a relatively short period of time with a relatively low risk exposure.  Update me as needed.  He agrees.

## 2018-05-03 NOTE — Assessment & Plan Note (Signed)
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  D/w pt.  Not bothersome enough to intervene per patient report.   He can update me as needed.  He has nearly full extension, only a few degrees short of full extension.

## 2018-05-18 ENCOUNTER — Telehealth: Payer: Self-pay | Admitting: *Deleted

## 2018-05-18 MED ORDER — MOMETASONE FUROATE 50 MCG/ACT NA SUSP: 1.0000 | Freq: Every day | NASAL | Status: DC

## 2018-05-18 NOTE — Telephone Encounter (Signed)
Spoke to pt who states he was advised to contact office back after two weeks of using nasocort. Pt states that he feels 70% better. Pt is wanting to know if he should continue taking 2sprays per nostril daily or if it should be reduced. pls advise

## 2018-05-18 NOTE — Telephone Encounter (Signed)
Thanks for the update.  Reasonable to try to cut back to 1 spray per nostril.  If he is doing a whole lot better with that he could try stopping the medication after about another 2 weeks.  Thanks.

## 2018-05-19 NOTE — Telephone Encounter (Signed)
Patient advised.

## 2018-05-28 ENCOUNTER — Encounter: Payer: Self-pay | Admitting: Family Medicine

## 2018-05-28 ENCOUNTER — Ambulatory Visit (INDEPENDENT_AMBULATORY_CARE_PROVIDER_SITE_OTHER): Payer: Medicare HMO | Admitting: Family Medicine

## 2018-05-28 ENCOUNTER — Emergency Department (HOSPITAL_COMMUNITY): Payer: Medicare HMO

## 2018-05-28 ENCOUNTER — Observation Stay (HOSPITAL_COMMUNITY)
Admission: EM | Admit: 2018-05-28 | Discharge: 2018-05-31 | Disposition: A | Discharge status: Home / Self Care | Payer: Medicare HMO | Attending: Cardiology | Admitting: Cardiology

## 2018-05-28 ENCOUNTER — Encounter (HOSPITAL_COMMUNITY): Payer: Self-pay

## 2018-05-28 ENCOUNTER — Other Ambulatory Visit: Payer: Self-pay

## 2018-05-28 VITALS — BP 122/80 | HR 120 | Temp 98.4°F | Ht 73.0 in

## 2018-05-28 DIAGNOSIS — I4891 Unspecified atrial fibrillation: Principal | ICD-10-CM | POA: Insufficient documentation

## 2018-05-28 DIAGNOSIS — Z79899 Other long term (current) drug therapy: Secondary | ICD-10-CM | POA: Diagnosis not present

## 2018-05-28 DIAGNOSIS — I5031 Acute diastolic (congestive) heart failure: Secondary | ICD-10-CM | POA: Diagnosis not present

## 2018-05-28 DIAGNOSIS — R000 Tachycardia, unspecified: Secondary | ICD-10-CM

## 2018-05-28 DIAGNOSIS — F329 Major depressive disorder, single episode, unspecified: Secondary | ICD-10-CM | POA: Diagnosis not present

## 2018-05-28 DIAGNOSIS — F419 Anxiety disorder, unspecified: Secondary | ICD-10-CM | POA: Insufficient documentation

## 2018-05-28 DIAGNOSIS — I083 Combined rheumatic disorders of mitral, aortic and tricuspid valves: Secondary | ICD-10-CM | POA: Diagnosis not present

## 2018-05-28 DIAGNOSIS — I10 Essential (primary) hypertension: Secondary | ICD-10-CM | POA: Diagnosis not present

## 2018-05-28 DIAGNOSIS — R609 Edema, unspecified: Secondary | ICD-10-CM | POA: Diagnosis not present

## 2018-05-28 DIAGNOSIS — R42 Dizziness and giddiness: Secondary | ICD-10-CM | POA: Diagnosis not present

## 2018-05-28 DIAGNOSIS — I447 Left bundle-branch block, unspecified: Secondary | ICD-10-CM | POA: Diagnosis not present

## 2018-05-28 DIAGNOSIS — R0602 Shortness of breath: Secondary | ICD-10-CM | POA: Diagnosis not present

## 2018-05-28 DIAGNOSIS — R0902 Hypoxemia: Secondary | ICD-10-CM | POA: Diagnosis not present

## 2018-05-28 DIAGNOSIS — E8770 Fluid overload, unspecified: Secondary | ICD-10-CM | POA: Diagnosis not present

## 2018-05-28 DIAGNOSIS — Z87891 Personal history of nicotine dependence: Secondary | ICD-10-CM | POA: Diagnosis not present

## 2018-05-28 DIAGNOSIS — I351 Nonrheumatic aortic (valve) insufficiency: Secondary | ICD-10-CM | POA: Diagnosis present

## 2018-05-28 DIAGNOSIS — R600 Localized edema: Secondary | ICD-10-CM | POA: Diagnosis present

## 2018-05-28 HISTORY — DX: Nonrheumatic aortic (valve) insufficiency: I35.1

## 2018-05-28 HISTORY — DX: Unspecified atrial fibrillation: I48.91

## 2018-05-28 HISTORY — DX: Ventricular premature depolarization: I49.3

## 2018-05-28 HISTORY — DX: Left bundle-branch block, unspecified: I44.7

## 2018-05-28 LAB — CBC WITH DIFFERENTIAL/PLATELET
Abs Immature Granulocytes: 0.02 10*3/uL (ref 0.00–0.07)
BASOS PCT: 0 %
Basophils Absolute: 0 10*3/uL (ref 0.0–0.1)
EOS ABS: 0 10*3/uL (ref 0.0–0.5)
Eosinophils Relative: 1 %
HEMATOCRIT: 44.8 % (ref 39.0–52.0)
Hemoglobin: 14.7 g/dL (ref 13.0–17.0)
IMMATURE GRANULOCYTES: 0 %
Lymphocytes Relative: 15 %
Lymphs Abs: 1 10*3/uL (ref 0.7–4.0)
MCH: 33.6 pg (ref 26.0–34.0)
MCHC: 32.8 g/dL (ref 30.0–36.0)
MCV: 102.3 fL — AB (ref 80.0–100.0)
MONO ABS: 0.6 10*3/uL (ref 0.1–1.0)
MONOS PCT: 9 %
NEUTROS PCT: 75 %
Neutro Abs: 5.2 10*3/uL (ref 1.7–7.7)
PLATELETS: 165 10*3/uL (ref 150–400)
RBC: 4.38 MIL/uL (ref 4.22–5.81)
RDW: 13.7 % (ref 11.5–15.5)
WBC: 6.9 10*3/uL (ref 4.0–10.5)
nRBC: 0 % (ref 0.0–0.2)

## 2018-05-28 LAB — I-STAT TROPONIN, ED: Troponin i, poc: 0 ng/mL (ref 0.00–0.08)

## 2018-05-28 LAB — COMPREHENSIVE METABOLIC PANEL
ALBUMIN: 3.5 g/dL (ref 3.5–5.0)
ALK PHOS: 56 U/L (ref 38–126)
ALT: 24 U/L (ref 0–44)
ANION GAP: 11 (ref 5–15)
AST: 46 U/L — AB (ref 15–41)
BILIRUBIN TOTAL: 3.3 mg/dL — AB (ref 0.3–1.2)
BUN: 13 mg/dL (ref 8–23)
CALCIUM: 9.1 mg/dL (ref 8.9–10.3)
CO2: 27 mmol/L (ref 22–32)
CREATININE: 1.05 mg/dL (ref 0.61–1.24)
Chloride: 100 mmol/L (ref 98–111)
GFR calc Af Amer: >60 mL/min (ref >60)
GFR calc non Af Amer: >60 mL/min (ref >60)
GLUCOSE: 100 mg/dL — AB (ref 70–99)
Potassium: 4.2 mmol/L (ref 3.5–5.1)
SODIUM: 138 mmol/L (ref 135–145)
TOTAL PROTEIN: 6.7 g/dL (ref 6.5–8.1)

## 2018-05-28 LAB — BRAIN NATRIURETIC PEPTIDE: B Natriuretic Peptide: 73.5 pg/mL (ref 0.0–100.0)

## 2018-05-28 MED ORDER — APIXABAN 5 MG PO TABS
5.0000 mg | ORAL_TABLET | Freq: Two times a day (BID) | ORAL | Status: DC
  Administered 2018-05-28 – 2018-05-31 (×6): 5 mg via ORAL
  Filled 2018-05-28 (×7): qty 1

## 2018-05-28 MED ORDER — METOPROLOL TARTRATE 5 MG/5ML IV SOLN
5.0000 mg | Freq: Once | INTRAVENOUS | Status: AC
  Administered 2018-05-28: 5 mg via INTRAVENOUS
  Filled 2018-05-28: qty 5

## 2018-05-28 MED ORDER — METOPROLOL SUCCINATE ER 25 MG PO TB24
12.5000 mg | ORAL_TABLET | Freq: Every day | ORAL | Status: DC
  Administered 2018-05-28 – 2018-05-29 (×2): 12.5 mg via ORAL
  Filled 2018-05-28: qty 1

## 2018-05-28 MED ORDER — ACETAMINOPHEN 325 MG PO TABS
650.0000 mg | ORAL_TABLET | ORAL | Status: DC | PRN
  Administered 2018-05-29: 325 mg via ORAL
  Administered 2018-05-30 – 2018-05-31 (×2): 650 mg via ORAL
  Filled 2018-05-28 (×3): qty 2

## 2018-05-28 MED ORDER — FUROSEMIDE 10 MG/ML IJ SOLN
40.0000 mg | Freq: Once | INTRAMUSCULAR | Status: AC
  Administered 2018-05-28: 40 mg via INTRAVENOUS
  Filled 2018-05-28: qty 4

## 2018-05-28 MED ORDER — CROMOLYN SODIUM 4 % OP SOLN
1.0000 [drp] | Freq: Two times a day (BID) | OPHTHALMIC | Status: DC
  Administered 2018-05-29 – 2018-05-31 (×4): 1 [drp] via OPHTHALMIC
  Filled 2018-05-28: qty 10

## 2018-05-28 MED ORDER — ASPIRIN EC 81 MG PO TBEC
81.0000 mg | DELAYED_RELEASE_TABLET | Freq: Every day | ORAL | Status: DC
  Administered 2018-05-29: 81 mg via ORAL
  Filled 2018-05-28 (×2): qty 1

## 2018-05-28 MED ORDER — NITROGLYCERIN 0.4 MG SL SUBL: 0.4000 mg | SUBLINGUAL_TABLET | SUBLINGUAL | Status: DC | PRN

## 2018-05-28 MED ORDER — ONDANSETRON HCL 4 MG/2ML IJ SOLN: 4.0000 mg | Freq: Four times a day (QID) | INTRAMUSCULAR | Status: DC | PRN

## 2018-05-28 MED ORDER — METOPROLOL SUCCINATE ER 25 MG PO TB24
25.0000 mg | ORAL_TABLET | Freq: Every day | ORAL | Status: DC
  Administered 2018-05-29 – 2018-05-31 (×3): 25 mg via ORAL
  Filled 2018-05-28 (×3): qty 1

## 2018-05-28 MED ORDER — ALPRAZOLAM 0.25 MG PO TABS: 0.1250 mg | ORAL_TABLET | Freq: Every day | ORAL | Status: DC | PRN

## 2018-05-28 NOTE — Progress Notes (Signed)
He had started nasacort and his cough was clearly better.  He tried to taper back on the medicine.  He noted that he had ear ringing and lighteadedness on med.   Then he clearly felt worse on 05/26/2018.  He didn't pass out but he attributed his sx to the nasal steroid.  He stopped nasacort at that point.    His prev fatigue never got better. He still feels some SOB and feels diffusely weak.  He was getting SOB walking 100 feet, this is worse/new.  He tried to inc his water intake in the last week.    Clinic scales are out of order at [facility] and we can't weigh him today.  He didn't think his weight was up but his ankles are swollen recently.    No CP but he doesn't feel like he is taking a full deep breath.    Meds, vitals, and allergies reviewed.   ROS: Per HPI unless specifically indicated in ROS section   GEN: nad, alert and oriented HEENT: mucous membranes moist NECK: supple w/o LA, UAN noted on exam.   CV: tachy with occ ectopy noted PULM: ctab, no inc wob ABD: soft, +bs EXT:1+ BLE  SKIN: no acute rash  Started on O2 and 911 called.

## 2018-05-28 NOTE — Consult Note (Addendum)
Cardiology Admission History and Physical:   Patient ID: COALTON ARCH MRN: [id number]; DOB: [birth year]   Admission date: 05/28/2018  Primary Care Provider: Tonia Ghent, MD Primary Cardiologist: Sinclair Grooms, MD  Primary Electrophysiologist:  None   Chief Complaint:  "fatigue, weakness, and shortness of breath"  Patient Profile:   Richard Saunders is a 78 y.o. male with a history of hypertension, LBBB, PVCs, aortic valve disease, and rheumatic fever as a child who presented to the [facility] ED for evaluation atrial fibrillation. Found to be in atrial fibrillation on arrival.   History of Present Illness:   Richard Saunders is a hypertension, LBBB, PVCs, aortic valve disease, and rheumatic fever as a child who is followed by Dr. [name]. Patient has been feeling more fatigued, weak, and short of breath for a while now that has worsened over the last 3 weeks with upper respiratory symptoms including nasal congestion and cough. He initially saw his PCP for this earlier this month who recommended he try Nasonex. However, this caused him to feel very lightheaded/dizzy and have tinnitus. Patient has had worsening dyspnea on exertion over the last couple of weeks to the point where he gets out of breath walking to the bathroom in his house and walking to his car. As a result, he has not been able to be as active recently. Patient returned to his PCP's office earlier today because he felt like his symptoms were worsening. Patient was found to be tachycardic so he was sent to the ED for further evaluation. Patient denies any palpitations, chest pain, orthopnea, or PND. He has noticed some ankle swelling recently but thought that was because he has been drinking more water.    In the ED: Initial EKG showed sinus tachycardia/ possible atrial flutter with rate of 121 bpm. Repeat EKG showed atrial fibrillation with ventricular rate of 87 bpm. I-stat troponin negative. Chest x-ray showed no acute  findings. BNP normal. CBC unremarkable. Na 138, K 4.2, Glucose 100, SCr 1.05. Patient received one dose of IV Lopressor 70m in the ED with improvement of heart rate. Cardiology was consulted to assess patient. Currently, patient states he feels better.   Past Medical History:  Diagnosis Date  . Allergy   . Anxiety   . Depression   . H/O: rheumatic fever 2012   (per patient) - eval by [facility] LBBB with PVC's  . Heart block bundle branch   . History of chicken pox   . Hypertension   . Organic impotence     Past Surgical History:  Procedure Laterality Date  . Cautery for nosebleeds    . Right elbow bursa removed    . TONSILLECTOMY AND ADENOIDECTOMY  1947     Medications Prior to Admission: Prior to Admission medications   Medication Sig Start Date End Date Taking? Authorizing Provider  acetaminophen (TYLENOL) 500 MG tablet Take 500 mg by mouth every 6 (six) hours as needed for headache (pain).    [provider]  ALPRAZolam (Duanne Moron 0.25 MG tablet Take 0.5 tablets (0.125 mg total) by mouth daily as needed for anxiety. 01/27/18   DTonia Ghent MD  aspirin EC 81 MG tablet Take 1 tablet (81 mg total) by mouth daily. 07/03/15   SBelva Crome MD  cromolyn (OPTICROM) 4 % ophthalmic solution Place 1 drop into both eyes 2 (two) times daily.  05/31/15   [provider]  GUAIFENESIN 1200 PO Take 1,200 mg by mouth daily as needed.  [provider]  hydrocortisone cream 1 % Apply 1 application topically 2 (two) times daily as needed for itching.    [provider]  metoprolol succinate (TOPROL-XL) 25 MG 24 hr tablet Take 0.5 tablets (12.5 mg total) by mouth daily. 01/27/18   Tonia Ghent, MD  naproxen sodium (ALEVE) 220 MG tablet Take 220 mg by mouth 2 (two) times daily as needed (pain).    [provider]  Polyethyl Glycol-Propyl Glycol (SYSTANE) 0.4-0.3 % SOLN Apply 1 drop to eye as needed. 11/28/17   Tonia Ghent, MD  polyethylene glycol  powder (GLYCOLAX/MIRALAX) powder Take 17 g by mouth 2 (two) times daily as needed. 01/27/18   Tonia Ghent, MD     Allergies:    Allergies  Allergen Reactions  . Colchicine Diarrhea  . Fluticasone Propionate Other (See Comments)    He didn't tolerate the taste/smell prev  . Nasacort [Triamcinolone]     Social History:   Social History   Socioeconomic History  . Marital status: Single    Spouse name: Not on file  . Number of children: Not on file  . Years of education: Not on file  . Highest education level: Not on file  Occupational History  . 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 Financial resource strain: Not on file  . Food insecurity:    Worry: Not on file    Inability: Not on file  . Transportation needs:    Medical: Not on file    Non-medical: Not on file  Tobacco Use  . Smoking status: Former Research scientist (life sciences)  . Smokeless tobacco: Never Used  Substance and Sexual Activity  . Alcohol use: Yes    Alcohol/week: 0.0 standard drinks    Comment: Rare  . Drug use: No  . Sexual activity: Not on file  Lifestyle  . Physical activity:    Days per week: Not on file    Minutes per session: Not on file  . Stress: Not on file  Relationships  . Social connections:    Talks on phone: Not on file    Gets together: Not on file    Attends religious service: Not on file    Active member of club or organization: Not on file    Attends meetings of clubs or organizations: Not on file    Relationship status: Not on file  . Intimate partner violence:    Fear of current or ex partner: Not on file    Emotionally abused: Not on file    Physically abused: Not on file    Forced sexual activity: Not on file  Other Topics Concern  . 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 There is no history of Colon cancer.    ROS:  Please see the history of present illness.  Review of Systems  Constitutional: Positive for malaise/fatigue. Negative for chills, diaphoresis and fever.  HENT: Positive for congestion and tinnitus.   Respiratory: Positive for cough and shortness of breath.   Cardiovascular: Positive for leg swelling. Negative  for chest pain, palpitations, orthopnea and PND.  Gastrointestinal: Negative for blood in stool, nausea and vomiting.  Genitourinary: Negative for hematuria.  Musculoskeletal: Negative for falls and myalgias.  Neurological: Positive for dizziness and weakness. Negative for loss of consciousness.  Endo/Heme/Allergies: Does not bruise/bleed easily.  Psychiatric/Behavioral: Negative for substance abuse (former tobacco abuse).   Physical Exam/Data:   Vitals:   05/28/18 1445 05/28/18 1500 05/28/18 1512 05/28/18 1530  BP: 130/72 115/72 115/72 121/86  Pulse: 80 84 85 83  Resp: (!) 21 (!) 27 (!) 22 19  Temp:      SpO2: 98% 96% 97% 98%  Weight:      Height:       No intake or output data in the 24 hours ending 05/28/18 1610 Last 3 Weights 05/28/2018 04/30/2018 01/27/2018  Weight (lbs) 230 lb 234 lb 12 oz 224 lb 4 oz  Weight (kg) 104.327 kg 106.482 kg 101.719 kg     Body mass index is 29.53 kg/m.  General:  Well nourished, well developed Caucasian male resting comfortably in no acute distress. HEENT: Normocephalic and atraumatic. Sclera clear. No xanthomas noted. EOMs intact. Lymph: No adenopathy. Neck: Supple. No JVD. Endocrine:  No thyromegaly. Vascular: No carotid bruits. Radial pulses 2+ and equal. Cardiac: Irregularly irregular with regular rate. No significant murmurs, gallops, or rubs appreciated. Lungs:  No increased work of  breathing. Clear to auscultation bilaterally. No wheezing, rhonchi, or rales.  Abd: Soft, ,mildly distended, and non-tender to palpation. Bowel sounds present. Ext: 1+ to 2+ pitting edema of bilateral lower extremities. Musculoskeletal:  No deformities. Neuro:  Alert and oriented x3. No focal abnormalities noted. Psych:  Normal affect. Responds appropriately.  EKG:  The ECG that was done was personally reviewed and demonstrates atrial fibrillation with ventricular rate of 87 bpm.  Telemetry: Telemetry was personally reviewed and demonstrates atrial fibrillation with ventricular rates in the 80's to 120's and frequent PVCs.  Relevant CV Studies:  Echocardiogram 10/21/2017: Study Conclusions: - Left ventricle: The cavity size was normal. Septal wall thickness   was increased in a pattern of moderate LVH with otherwise mild   concentric hypertrophy. Systolic function was normal. The   estimated ejection fraction was in the range of 60% to 65%. Wall   motion was normal; there were no regional wall motion   abnormalities. - Aortic valve: Transvalvular velocity was within the normal range.   There was no stenosis. There was mild to moderate regurgitation. - Mitral valve: Transvalvular velocity was within the normal range.   There was no evidence for stenosis. There was mild regurgitation. - Left atrium: The atrium was moderately dilated. - Right ventricle: The cavity size was normal. Wall thickness was   normal. Systolic function was normal. - Tricuspid valve: There was mild regurgitation. - Pulmonary arteries: Systolic pressure was within the normal   range. PA peak pressure: 27 mm Hg (S).  Laboratory Data:  Chemistry Recent Labs  Lab 05/28/18 1107  NA 138  K 4.2  CL 100  CO2 27  GLUCOSE 100*  BUN 13  CREATININE 1.05  CALCIUM 9.1  GFRNONAA >60  GFRAA >60  ANIONGAP 11    Recent Labs  Lab 05/28/18 1107  PROT 6.7  ALBUMIN 3.5  AST 46*  ALT 24  ALKPHOS 56  BILITOT 3.3*    Hematology Recent Labs  Lab 05/28/18 1107  WBC 6.9  RBC 4.38  HGB 14.7  HCT 44.8  MCV 102.3*  MCH 33.6  MCHC 32.8  RDW  13.7  PLT 165   Cardiac EnzymesNo results for input(s): TROPONINI in the last 168 hours.  Recent Labs  Lab 05/28/18 1123  TROPIPOC 0.00    BNP Recent Labs  Lab 05/28/18 1115  BNP 73.5    DDimer No results for input(s): DDIMER in the last 168 hours.  Radiology/Studies:  Dg Chest Port 1 View  Result Date: 05/28/2018 CLINICAL DATA:  Shortness of breath EXAM: PORTABLE CHEST 1 VIEW COMPARISON:  08/29/2016 FINDINGS: Cardiac shadow is enlarged but stable. Elevation of left hemidiaphragm is noted with small left pleural effusion and likely underlying atelectasis. No bony abnormality is seen. IMPRESSION: Mild left pleural effusion and underlying atelectasis. Electronically Signed   By: Inez Catalina M.D.   On: 05/28/2018 11:30    Assessment and Plan:   Atrial Fibrillation with RVR - Patient presents for evaluation of increased fatigue, weakness, and shortness of breath over the last few weeks. Found to be tachycardic at PCP's office earlier today and was transported to ED for further evaluation.  - Initial EKG shows sinus tachycardia/ possible atrial flutter with rate of 121 bpm. Repeat EKG showed atrial fibrillation. However, patient has since gone into atrial fibrillation. - Telemetry shows atrial fibrillation with ventricular rates in the 80's to 120's.  - I-stat troponin negative. - BNP normal. - Potassium 4.2. - Will check TSH. - Received IV Lopressor 31m in the ED with improvement of rate. - Currently on Toprol 12.545mfor frequent PVCs. Will increase to 2557maily. - CHADSVASc = 3 (HTN, age x2). Will start Eliquis 5mg76mice daily. - Will admit for observation overnight. Will give one dose of IV Lasix given lower extremity edema. Will also recheck Echo.   Severity of Illness: The appropriate patient status for this patient is OBSERVATION. Observation  status is judged to be reasonable and necessary in order to provide the required intensity of service to ensure the patient's safety. The patient's presenting symptoms, physical exam findings, and initial radiographic and laboratory data in the context of their medical condition is felt to place them at decreased risk for further clinical deterioration. Furthermore, it is anticipated that the patient will be medically stable for discharge from the hospital within 2 midnights of admission. The following factors support the patient status of observation.   " The patient's presenting symptoms include fatigue, weakness, and dyspnea. " The physical exam findings include irregular heart rhythm and lower extremity edema. " The initial radiographic and laboratory data are unremarkable. EKG showed atrial fibrillation.     For questions or updates, please contact [facility] consult [email or web address] for contact info under        Signed, CallDarreld Mclean-C  05/28/2018 4:10 PM

## 2018-05-28 NOTE — ED Triage Notes (Signed)
Pt has been experienced UR symptoms for 2-3 weeks.  Pt noticed an increase in BLE edema and was seen at his PCP office today and was also having sob and increased HR.  Upon EMS arrival 12 lead showed A-fib.  Pt is not on blood thinners, however reports a hx of irregular HR.  Denies hx of CHF and MI

## 2018-05-28 NOTE — Progress Notes (Signed)
Pt arrived to unit around 1850, telemetry applied, CMMD notified of  Pt admission, poc and orders reviewed, call light in reach

## 2018-05-28 NOTE — ED Provider Notes (Signed)
[facility] EMERGENCY DEPARTMENT Provider Note   CSN: [id number] Arrival date & time: 05/28/18  1055     History   Chief Complaint Chief Complaint  Patient presents with  . Atrial Fibrillation    HPI Richard Saunders is a 78 y.o. male.  78 year old male prior medical history as detailed below presents for evaluation of shortness of breath with associated fast heart rate.  Patient reports 2 to 3 weeks of gradually worsening shortness of breath.  This is worse with exertion.  Patient was sent to the ED today after an evaluation by his primary care provider.  He was noted to be in a rapid rhythm that was likely atrial fibrillation.  Patient denies prior history of atrial fibrillation.  Patient denies associated chest pain, nausea, vomiting, fever, or other complaint.   She is well-known to Dr. [name] of cardiology.  The history is provided by the patient and medical records.  Atrial Fibrillation  This is a new problem. The current episode started more than 2 days ago. The problem occurs constantly. The problem has not changed since onset.Pertinent negatives include no chest pain, no abdominal pain, no headaches and no shortness of breath. Nothing aggravates the symptoms. Nothing relieves the symptoms.    Past Medical History:  Diagnosis Date  . Allergy   . Anxiety   . Depression   . H/O: rheumatic fever 2012   (per patient) - eval by [facility] LBBB with PVC's  . Heart block bundle branch   . History of chicken pox   . Hypertension   . Organic impotence     Patient Active Problem List   Diagnosis Date Noted  . Tachycardia 05/28/2018  . Dupuytren contracture 05/03/2018  . Neck pain 01/27/2018  . Bell's palsy 11/29/2017  . Healthcare maintenance 10/28/2017  . Advance care planning 10/28/2017  . Left ventricular hypertrophy 06/10/2013  . Gout 08/04/2012  . Cough 09/26/2011  . Left bundle branch block 06/23/2010  . ANXIETY DEPRESSION 06/07/2010  .  RHEUMATIC FEVER 06/07/2010  . Premature ventricular contractions 06/07/2010  . ALLERGIC RHINITIS 06/07/2010  . 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 Aortic regurgitation 06/07/2010  . CHICKENPOX, HX OF 06/07/2010    Past Surgical History:  Procedure Laterality Date  . Cautery for nosebleeds    . Right elbow bursa removed    . TONSILLECTOMY AND ADENOIDECTOMY  1947        Home Medications    Prior to Admission medications   Medication Sig Start Date End Date Taking? Authorizing Provider  acetaminophen (TYLENOL) 500 MG tablet Take 500 mg by mouth every 6 (six) hours as needed for headache (pain).    [provider]  ALPRAZolam Duanne Moron) 0.25 MG tablet Take 0.5 tablets (0.125 mg total) by mouth daily as needed for anxiety. 01/27/18   Tonia Ghent, MD  aspirin EC 81 MG tablet Take 1 tablet (81 mg total) by mouth daily. 07/03/15   Belva Crome, MD  cromolyn (OPTICROM) 4 % ophthalmic solution Place 1 drop into both eyes 2 (two) times daily.  05/31/15   [provider]  GUAIFENESIN 1200 PO Take 1,200 mg by mouth daily as needed.    [provider]  hydrocortisone cream 1 % Apply 1 application topically 2 (two) times daily as needed for itching.    [provider]  metoprolol succinate (TOPROL-XL) 25 MG 24 hr tablet Take 0.5 tablets (12.5 mg total) by mouth daily. 01/27/18   Tonia Ghent, MD  naproxen sodium (ALEVE) 220 MG tablet Take 220 mg by mouth 2 (two) times daily as needed (pain).    [provider]  Polyethyl Glycol-Propyl Glycol (SYSTANE) 0.4-0.3 % SOLN Apply 1 drop to eye as needed. 11/28/17   Tonia Ghent, MD  polyethylene glycol powder (GLYCOLAX/MIRALAX) powder Take 17 g by mouth 2 (two) times daily as needed. 01/27/18   Tonia Ghent, MD    Family History Family History  Problem Relation Age of Onset  . Cancer Mother        ovarian, uterine  . Aneurysm Mother        AAA in 26  . Cancer Father        Prostate  . COPD Father    . Prostate cancer Father   . Diabetes Other   . Heart disease Other        CAD <60 male  . Hypertension Other   . Colon cancer Neg Hx     Social History Social History   Tobacco Use  . Smoking status: Former Research scientist (life sciences)  . Smokeless tobacco: Never Used  Substance Use Topics  . Alcohol use: Yes    Alcohol/week: 0.0 standard drinks    Comment: Rare  . Drug use: No     Allergies   Colchicine; Fluticasone propionate; and Nasacort [triamcinolone]   Review of Systems Review of Systems  Respiratory: Negative for shortness of breath.   Cardiovascular: Negative for chest pain.  Gastrointestinal: Negative for abdominal pain.  Neurological: Negative for headaches.  All other systems reviewed and are negative.    Physical Exam Updated Vital Signs BP 121/86   Pulse 83   Temp 97.8 F (36.6 C)   Resp 19   Ht 6' 2"  (1.88 m)   Wt 104.3 kg   SpO2 98%   BMI 29.53 kg/m   Physical Exam Vitals signs and nursing note reviewed.  Constitutional:      General: He is not in acute distress.    Appearance: He is well-developed.  HENT:     Head: Normocephalic and atraumatic.  Eyes:     Conjunctiva/sclera: Conjunctivae normal.     Pupils: Pupils are equal, round, and reactive to light.  Neck:     Musculoskeletal: Normal range of motion and neck supple.  Cardiovascular:     Rate and Rhythm: Tachycardia present. Rhythm irregular.     Heart sounds: Normal heart sounds.  Pulmonary:     Effort: Pulmonary effort is normal. No respiratory distress.     Breath sounds: Normal breath sounds.  Abdominal:     General: There is no distension.     Palpations: Abdomen is soft.     Tenderness: There is no abdominal tenderness.  Musculoskeletal: Normal range of motion.        General: No deformity.  Skin:    General: Skin is warm and dry.  Neurological:     Mental Status: He is alert and oriented to person, place, and time.      ED Treatments / Results  Labs (all labs ordered are  listed, but only abnormal results are displayed) Labs Reviewed  COMPREHENSIVE METABOLIC PANEL - Abnormal; Notable for the following components:      Result Value   Glucose, Bld 100 (*)    AST 46 (*)    Total Bilirubin 3.3 (*)    All other components within normal limits  CBC WITH DIFFERENTIAL/PLATELET - Abnormal; Notable for the following components:   MCV 102.3 (*)  All other components within normal limits  BRAIN NATRIURETIC PEPTIDE  I-STAT TROPONIN, ED    EKG EKG Interpretation  Date/Time:  Thursday May 28 2018 13:08:05 EST Ventricular Rate:  87 PR Interval:    QRS Duration: 98 QT Interval:  363 QTC Calculation: 437 R Axis:   -32 Text Interpretation:  Atrial flutter Inferior infarct, old Lateral leads are also involved Confirmed by Dene Gentry [phone]) on 05/28/2018 2:11:16 PM   Radiology Dg Chest Port 1 View  Result Date: 05/28/2018 CLINICAL DATA:  Shortness of breath EXAM: PORTABLE CHEST 1 VIEW COMPARISON:  08/29/2016 FINDINGS: Cardiac shadow is enlarged but stable. Elevation of left hemidiaphragm is noted with small left pleural effusion and likely underlying atelectasis. No bony abnormality is seen. IMPRESSION: Mild left pleural effusion and underlying atelectasis. Electronically Signed   By: Inez Catalina M.D.   On: 05/28/2018 11:30    Procedures Procedures (including critical care time)  Medications Ordered in ED Medications  metoprolol tartrate (LOPRESSOR) injection 5 mg (5 mg Intravenous Given 05/28/18 1213)     Initial Impression / Assessment and Plan / ED Course  I have reviewed the triage vital signs and the nursing notes.  Pertinent labs & imaging results that were available during my care of the patient were reviewed by me and considered in my medical decision making (see chart for details).     MDM  Screen complete  Patient is presenting for evaluation of apparent new onset atrial fibrillation.  Patient is noted to be in A. fib with mild RVR  vs Sinus Tach upon initial evaluation.  Patient was given 5 mg Lopressor IV with improvement in his heart rate. However, repeat EKG demonstrates A.Flutter with a controlled rate.   Screening labs obtained did not demonstrate other significant abnormality.  X-ray does show a mild left-sided pleural effusion.  Cardiology is aware of case and will evaluate for admission  Final Clinical Impressions(s) / ED Diagnoses   Final diagnoses:  Atrial fibrillation, unspecified type [facility])    ED Discharge Orders    None       Valarie Merino, MD 05/28/18 [phone]

## 2018-05-28 NOTE — Patient Instructions (Signed)
To ER

## 2018-05-28 NOTE — Assessment & Plan Note (Signed)
D/w pt.  My presumption is that the patient has a tachycardia that could have contributed to decompensation/heart failure sx.  I wouldn't generally attribute this to nasal steroid use.  This may have been incidental.   Given his SOB, ankle edema, tachycardia, I didn't recommend outpatient tx.  D/w pt about options and most reasonable for ER eval for SOB/tachycardia.  D/w pt.  He agrees.  911 called, EMS transported, d/w EMS crew.   App help of all involved.  Routed to Dr. [name] with cards as Juluis Rainier.   >25 minutes spent in face to face time with patient, >50% spent in counselling or coordination of care.

## 2018-05-29 ENCOUNTER — Observation Stay (HOSPITAL_BASED_OUTPATIENT_CLINIC_OR_DEPARTMENT_OTHER): Payer: Medicare HMO

## 2018-05-29 DIAGNOSIS — I340 Nonrheumatic mitral (valve) insufficiency: Secondary | ICD-10-CM | POA: Diagnosis not present

## 2018-05-29 DIAGNOSIS — I5031 Acute diastolic (congestive) heart failure: Secondary | ICD-10-CM | POA: Diagnosis not present

## 2018-05-29 DIAGNOSIS — I447 Left bundle-branch block, unspecified: Secondary | ICD-10-CM | POA: Diagnosis not present

## 2018-05-29 DIAGNOSIS — R0602 Shortness of breath: Secondary | ICD-10-CM | POA: Diagnosis not present

## 2018-05-29 DIAGNOSIS — I361 Nonrheumatic tricuspid (valve) insufficiency: Secondary | ICD-10-CM

## 2018-05-29 DIAGNOSIS — I4891 Unspecified atrial fibrillation: Secondary | ICD-10-CM | POA: Diagnosis not present

## 2018-05-29 DIAGNOSIS — E8770 Fluid overload, unspecified: Secondary | ICD-10-CM | POA: Diagnosis not present

## 2018-05-29 LAB — ECHOCARDIOGRAM COMPLETE
Height: 73 in
Weight: 3827.2 oz

## 2018-05-29 LAB — TSH: TSH: 1.773 u[IU]/mL (ref 0.350–4.500)

## 2018-05-29 LAB — BASIC METABOLIC PANEL
Anion gap: 9 (ref 5–15)
BUN: 16 mg/dL (ref 8–23)
CO2: 31 mmol/L (ref 22–32)
Calcium: 8.8 mg/dL — ABNORMAL LOW (ref 8.9–10.3)
Chloride: 100 mmol/L (ref 98–111)
Creatinine, Ser: 1.02 mg/dL (ref 0.61–1.24)
GFR calc Af Amer: >60 mL/min (ref >60)
Glucose, Bld: 103 mg/dL — ABNORMAL HIGH (ref 70–99)
Potassium: 4.4 mmol/L (ref 3.5–5.1)
Sodium: 140 mmol/L (ref 135–145)

## 2018-05-29 NOTE — Progress Notes (Signed)
  Echocardiogram 2D Echocardiogram has been performed.  Richard Saunders G Richard Saunders 05/29/2018, 11:43 AM

## 2018-05-29 NOTE — Progress Notes (Signed)
Progress Note  Patient Name: Richard Saunders Date of Encounter: 05/29/2018  Primary Cardiologist: Sinclair Grooms, MD   Subjective   Denies any chest pain and shortness of breath is much improved.  Remains in atrial fibrillation with occasional PVCs but heart rate controlled.  Inpatient Medications    Scheduled Meds: . apixaban  5 mg Oral BID  . aspirin EC  81 mg Oral Daily  . cromolyn  1 drop Both Eyes BID  . metoprolol succinate  12.5 mg Oral Daily  . metoprolol succinate  25 mg Oral Daily   Continuous Infusions:  PRN Meds: acetaminophen, ALPRAZolam, nitroGLYCERIN, ondansetron (ZOFRAN) IV   Vital Signs    Vitals:   05/28/18 1820 05/29/18 0014 05/29/18 0424 05/29/18 0848  BP: 137/81 (!) 123/91 (!) 123/91 119/77  Pulse: 100 85 85 (!) 57  Resp: 18 18 18  (!) 22  Temp: (!) 97.5 F (36.4 C) 97.9 F (36.6 C) 97.9 F (36.6 C) 97.6 F (36.4 C)  TempSrc: Oral Oral Oral Oral  SpO2: 94% 98% 98% 98%  Weight: 110.9 kg  108.5 kg   Height: 6' 1"  (1.854 m)       Intake/Output Summary (Last 24 hours) at 05/29/2018 1048 Last data filed at 05/29/2018 0934 Gross per 24 hour  Intake 720 ml  Output 1130 ml  Net -410 ml   Last 3 Weights 05/29/2018 05/28/2018 05/28/2018  Weight (lbs) 239 lb 3.2 oz 244 lb 9.6 oz 230 lb  Weight (kg) 108.5 kg 110.95 kg 104.327 kg      Telemetry    A fibrillation with controlled ventricular response and occasional PVCs- Personally Reviewed  ECG    No new EKG to compare - Personally Reviewed  Physical Exam   GEN: No acute distress.   Neck: No JVD Cardiac:  Irregular no murmurs, rubs, or gallops.  Respiratory: Clear to auscultation bilaterally. GI: Soft, nontender, non-distended  MS: No edema; No deformity. Neuro:  Nonfocal  Psych: Normal affect   Labs    Chemistry Recent Labs  Lab 05/28/18 1107 05/29/18 0541  NA 138 140  K 4.2 4.4  CL 100 100  CO2 27 31  GLUCOSE 100* 103*  BUN 13 16  CREATININE 1.05 1.02  CALCIUM 9.1 8.8*   PROT 6.7  --   ALBUMIN 3.5  --   AST 46*  --   ALT 24  --   ALKPHOS 56  --   BILITOT 3.3*  --   GFRNONAA >60 >60  GFRAA >60 >60  ANIONGAP 11 9     Hematology Recent Labs  Lab 05/28/18 1107  WBC 6.9  RBC 4.38  HGB 14.7  HCT 44.8  MCV 102.3*  MCH 33.6  MCHC 32.8  RDW 13.7  PLT 165    Cardiac EnzymesNo results for input(s): TROPONINI in the last 168 hours.  Recent Labs  Lab 05/28/18 1123  TROPIPOC 0.00     BNP Recent Labs  Lab 05/28/18 1115  BNP 73.5     DDimer No results for input(s): DDIMER in the last 168 hours.   Radiology    Dg Chest Port 1 View  Result Date: 05/28/2018 CLINICAL DATA:  Shortness of breath EXAM: PORTABLE CHEST 1 VIEW COMPARISON:  08/29/2016 FINDINGS: Cardiac shadow is enlarged but stable. Elevation of left hemidiaphragm is noted with small left pleural effusion and likely underlying atelectasis. No bony abnormality is seen. IMPRESSION: Mild left pleural effusion and underlying atelectasis. Electronically Signed   By: Elta Guadeloupe  Lukens M.D.   On: 05/28/2018 11:30    Cardiac Studies   none  Patient Profile     78 y.o. male with a history of hypertension, left bundle branch block, PVCs and a history of rheumatic fever as a child with mild to moderate aortic insufficiency and mild MR with moderately dilated LA by echo 10/21/2017.  Assessment & Plan    1.  New onset atrial fibrillation with RVR -Initial EKG appeared to be atrial flutter with RVR  -Now in atrial fibrillation with controlled ventricular response -Possibly triggered by recent URI -Heart rate much improved on Toprol-XL 25 mg daily -Started on Eliquis 5 mg twice daily for CHADSVASc = 3 (HTN, age x2). -Stop ASA -TSH normal at 1.77 -Potassium 4.4 -Troponin 0 and BNP normal at 73.5 -2D echo pending -Need to be anticoagulated for 3 to 4 weeks prior to outpatient cardioversion  2.  Volume overload -Had increasing abdominal fullness and 1+ pitting edema of his lower extremities  on admission -Likely related to atrial fibrillation with RVR  -Concern for possible development of tachycardia related cardiomyopathy as he cannot tell that he is in A. fib and do not know how long he has been in it -Given Lasix IV yesterday and lower extremity edema has resolved and abdominal fullness much improved -UOP of 1.13 L and is net negative for 410 cc -Await results of 2D echo -No further Lasix for now  3.  Shortness of breath -He has been having problems with shortness of breath and weakness for over 3 weeks -Initially felt to be due to a URI -Symptoms progressed to the point he was short of breath just walking to the bathroom -BNP normal on exam but he did have signs of volume overload with lower extremity edema and increased abdominal fullness on admission which has resolved with 1 dose of Lasix 40 mg IV -Echo 1 year ago showed EF 60 to 65% with mild to moderate AR and mild MR -2D echo pending to rule out tachycardia induced cardiomyopathy as well as assess for worsening of aortic valve disease -Troponin negative 1 continue to cycle. -Sob mproved after IV Lasix and getting control of heart rate   For questions or updates, please contact [facility] Please consult [email or web address] for contact info under        Signed, Ledora Bottcher, PA  05/29/2018, 10:48 AM

## 2018-05-29 NOTE — Progress Notes (Signed)
Benefit check in progress for Eliquis; Aneta Mins [phone]

## 2018-05-29 NOTE — Care Management (Signed)
#    2.   S/W  [facility] @ [facility]  #  [phone]   APIXABAN : NONE FORMULARY  ELIQUIS  5 MF BID  COVER- YES CO-PAY- $ 45.00 TIER- 3 DRUG PRIOR APPROVAL- NO  PREFERRED PHARMACY :  YES -   [facility]

## 2018-05-29 NOTE — Discharge Instructions (Signed)

## 2018-05-30 ENCOUNTER — Encounter (HOSPITAL_COMMUNITY): Payer: Self-pay | Admitting: Nurse Practitioner

## 2018-05-30 DIAGNOSIS — I447 Left bundle-branch block, unspecified: Secondary | ICD-10-CM | POA: Diagnosis not present

## 2018-05-30 DIAGNOSIS — I5031 Acute diastolic (congestive) heart failure: Secondary | ICD-10-CM

## 2018-05-30 DIAGNOSIS — I4891 Unspecified atrial fibrillation: Secondary | ICD-10-CM

## 2018-05-30 DIAGNOSIS — I351 Nonrheumatic aortic (valve) insufficiency: Secondary | ICD-10-CM

## 2018-05-30 MED ORDER — FUROSEMIDE 10 MG/ML IJ SOLN
40.0000 mg | Freq: Four times a day (QID) | INTRAMUSCULAR | Status: AC
  Administered 2018-05-30 (×2): 40 mg via INTRAVENOUS
  Filled 2018-05-30 (×2): qty 4

## 2018-05-30 NOTE — Progress Notes (Signed)
Progress Note  Patient Name: Richard Saunders Date of Encounter: 05/30/2018  Primary Cardiologist: Sinclair Grooms, MD   Subjective   Feeling better.  A little short of breath.  Has only ambulated in the room.   Inpatient Medications    Scheduled Meds: . apixaban  5 mg Oral BID  . cromolyn  1 drop Both Eyes BID  . metoprolol succinate  25 mg Oral Daily   Continuous Infusions:  PRN Meds: acetaminophen, ALPRAZolam, nitroGLYCERIN, ondansetron (ZOFRAN) IV   Vital Signs    Vitals:   05/29/18 1244 05/29/18 1957 05/30/18 0443 05/30/18 1130  BP: 126/77 117/72 108/72 109/81  Pulse: 62 76 77 (!) 113  Resp: (!) 22 18 18 20   Temp: (!) 97.5 F (36.4 C) 98.1 F (36.7 C) (!) 97.3 F (36.3 C) 98 F (36.7 C)  TempSrc: Oral Oral Oral Oral  SpO2: 98% 97% 94% 95%  Weight:   109.1 kg   Height:        Intake/Output Summary (Last 24 hours) at 05/30/2018 1237 Last data filed at 05/30/2018 1144 Gross per 24 hour  Intake 1080 ml  Output 550 ml  Net 530 ml   Last 3 Weights 05/30/2018 05/29/2018 05/28/2018  Weight (lbs) 240 lb 9.6 oz 239 lb 3.2 oz 244 lb 9.6 oz  Weight (kg) 109.135 kg 108.5 kg 110.95 kg      Telemetry    Atrial fibirillation.  Rate 50-70s.  2s pauses. - Personally Reviewed  ECG    n/a - Personally Reviewed  Physical Exam   VS:  BP 109/81 (BP Location: Right Arm)   Pulse (!) 113   Temp 98 F (36.7 C) (Oral)   Resp 20   Ht 6' 1"  (1.854 m)   Wt 109.1 kg Comment: scale b  SpO2 95%   BMI 31.74 kg/m  , BMI Body mass index is 31.74 kg/m. GENERAL:  Well appearing HEENT: Pupils equal round and reactive, fundi not visualized, oral mucosa unremarkable NECK:  + jugular venous distention, waveform within normal limits, carotid upstroke brisk and symmetric, no bruits, no thyromegaly LYMPHATICS:  No cervical adenopathy LUNGS:  Clear to auscultation bilaterally HEART:  Irregularly irregular.  PMI not displaced or sustained,S1 and S2 within normal limits, no S3, no S4,  no clicks, no rubs, II/VI systolic murmurs ABD:  Flat, positive bowel sounds normal in frequency in pitch, no bruits, no rebound, no guarding, no midline pulsatile mass, no hepatomegaly, no splenomegaly EXT:  2 plus pulses throughout, 2+ LE edema, no cyanosis no clubbing SKIN:  No rashes no nodules NEURO:  Cranial nerves II through XII grossly intact, motor grossly intact throughout [facility]:  Cognitively intact, oriented to person place and time   Labs    Chemistry Recent Labs  Lab 05/28/18 1107 05/29/18 0541  NA 138 140  K 4.2 4.4  CL 100 100  CO2 27 31  GLUCOSE 100* 103*  BUN 13 16  CREATININE 1.05 1.02  CALCIUM 9.1 8.8*  PROT 6.7  --   ALBUMIN 3.5  --   AST 46*  --   ALT 24  --   ALKPHOS 56  --   BILITOT 3.3*  --   GFRNONAA >60 >60  GFRAA >60 >60  ANIONGAP 11 9     Hematology Recent Labs  Lab 05/28/18 1107  WBC 6.9  RBC 4.38  HGB 14.7  HCT 44.8  MCV 102.3*  MCH 33.6  MCHC 32.8  RDW 13.7  PLT 165  Cardiac EnzymesNo results for input(s): TROPONINI in the last 168 hours.  Recent Labs  Lab 05/28/18 1123  TROPIPOC 0.00     BNP Recent Labs  Lab 05/28/18 1115  BNP 73.5     DDimer No results for input(s): DDIMER in the last 168 hours.   Radiology    No results found.  Cardiac Studies   Echo 05/29/18: IMPRESSIONS    1. The left ventricle has normal systolic function of 55-21%. The cavity size is normal. There is no left ventricular wall thickness. Echo evidence of normal diastolic filling patterns.  2. Normal left atrial size.  3. Normal right atrial size.  4. The mitral valve Mildly thickened. Systolic anterior motion anterior mitral leafet. Regurgitation is mild to moderate by color flow Doppler.  5. Normal tricuspid valve.  6. Tricuspid regurgitation is mild.  7. The aortic valve tricuspid. There is mild thickening and mild calcification of the aortic valve. Aortic valve regurgitation is mild by color flow Doppler.  8. No atrial level  shunt detected by color flow Doppler.  Patient Profile     78 y.o. male with hypertension, LBBB, PVCs, rheumatic fever, mild AR, moderate MR here with new onset atrial fibrillation/flutter.   Assessment & Plan    # New onset atrial fibrillation/flutter: Rates now well-controlled on metoprolol..  Recent URI.  Started on Eliquis. Thyroid function normal.  LVEF 55-60% on echo.  Plan for outpatient DCCV in 3-4 weeks.   # PVCs: Stable on metoprolol.   # Acute diastolic heart failure: Likely related to new onset atrial fibrillation.  He is volume overloaded on exam and dyspneic while talking.  We will give two doses of IV lasix and reassess tomorrow.  Of note, he thought he was dehydrated prior to admission and significantly increased his fluid intake.     For questions or updates, please contact [facility] Please consult [email or web address] for contact info under        Signed, Skeet Latch, MD  05/30/2018, 12:37 PM

## 2018-05-30 NOTE — H&P (Signed)
Attestation signed by Richard Margarita, MD at 05/28/2018 5:50 PM (Updated)  Patient seen and independently examined with Richard Rives, PA. We discussed all aspects of the encounter. I agree with the assessment and plan as stated above this is a very pleasant 78 year old male followed by Dr. Tamala Saunders with a history of hypertension, left bundle branch block, PVCs and a history of rheumatic fever as a child with mild to moderate aortic insufficiency and mild MR with moderately dilated LA by echo 10/21/2017.  He apparently had been in his usual state of health until about 3 weeks ago when he started having URI symptoms including nasal congestion and cough.  He says he has chronic sinus issues but they became much worse 3 weeks ago.  He did not have any fever.  He saw his PCP and started on nasal steroid spray.  He then started developing worsening shortness of breath to the point that he was short of breath just walking to the bathroom or to the car.  He also has noted increasing lower extremity edema over the past 24 to 48 hours including increasing abdominal firmness.  He saw his PCP earlier today because he was getting worse and he was found to be tachycardic and was sent to the ER where he was found to be in atrial flutter at 121 bpm.  Repeat EKG showed atrial fibrillation at 87 bpm.  Initial troponin was negative and chest x-ray showed no acute findings.  BNP normal.  CBC was normal.  He received 1 dose of IV Lopressor 5 mg in the ER with improvement his heart rate.  Cardiology is now asked to see.  GEN: Well nourished, well developed in no acute distress HEENT: Normal NECK: No JVD; No carotid bruits LYMPHATICS: No lymphadenopathy CARDIAC: Irregularly irregular, no murmurs, rubs, gallops RESPIRATORY: Decreased breath sounds at the bases  ABDOMEN: Soft, non-tender, non-distended MUSCULOSKELETAL: 1+ lower extremity pitting edema edema; No deformity  SKIN: Warm and dry NEUROLOGIC:  Alert and oriented x  3 PSYCHIATRIC:  Normal affect    Currently appears to be in atrial flutter/fibrillation with occasional PVCs.  Potassium is normal at 4.2 sodium, 138 and creatinine 1.05.  BNP normal at 73.5 and troponin negative x2.  He cannot tell that he is in atrial flutter and denies any palpitations or irregularity to his heartbeat.  It is difficult to say whether he has been in A. fib for a while and is now developed LV dysfunction with heart failure symptoms or whether he has an upper respiratory infection that triggered atrial fibrillation.  He does hear volume overload on exam with increased abdominal firmness as well as 1+ pitting edema of his lower extremities.  I am going to increase his Toprol-XL to 25 mg daily for better rate control.  We will start him on Eliquis 5 mg twice daily for CHADS2VASC score of 3.  Check TSH and 2D echocardiogram in the a.m. to make sure aortic insufficiency has not worsened and that LV function remains normal.  We will give him a dose of Lasix IV 40 mg now and reassess volume status in the a.m.       Expand All Collapse All    Show:Clear all [x] Manual[x] Template[x] Copied  Added by: [x] Richard Mclean, PA-C  [] Hover for details  Cardiology Admission History and Physical:   Patient ID: Richard Saunders MRN: [id number]; DOB: 1940-10-17   Admission date: 05/28/2018  Primary Care Provider: Tonia Ghent, MD Primary Cardiologist: Richard Grooms, MD  Primary Electrophysiologist:  None   Chief Complaint:  "fatigue, weakness, and shortness of breath"  Patient Profile:   Richard Saunders is a 78 y.o. male with a history of hypertension, LBBB, PVCs, aortic valve disease, and rheumatic fever as a child who presented to the [facility] ED for evaluation atrial fibrillation. Found to be in atrial fibrillation on arrival.   History of Present Illness:   Richard Saunders is a hypertension, LBBB, PVCs, aortic valve disease, and rheumatic fever as a child  who is followed by Dr. Tamala Saunders. Patient has been feeling more fatigued, weak, and short of breath for a while now that has worsened over the last 3 weeks with upper respiratory symptoms including nasal congestion and cough. He initially saw his PCP for this earlier this month who recommended he try Nasonex. However, this caused him to feel very lightheaded/dizzy and have tinnitus. Patient has had worsening dyspnea on exertion over the last couple of weeks to the point where he gets out of breath walking to the bathroom in his house and walking to his car. As a result, he has not been able to be as active recently. Patient returned to his PCP's office earlier today because he felt like his symptoms were worsening. Patient was found to be tachycardic so he was sent to the ED for further evaluation. Patient denies any palpitations, chest pain, orthopnea, or PND. He has noticed some ankle swelling recently but thought that was because he has been drinking more water.    In the ED: Initial EKG showed sinus tachycardia/ possible atrial flutter with rate of 121 bpm. Repeat EKG showed atrial fibrillation with ventricular rate of 87 bpm. I-stat troponin negative. Chest x-ray showed no acute findings. BNP normal. CBC unremarkable. Na 138, K 4.2, Glucose 100, SCr 1.05. Patient received one dose of IV Lopressor 53m in the ED with improvement of heart rate. Cardiology was consulted to assess patient. Currently, patient states he feels better.       Past Medical History:  Diagnosis Date  . Allergy   . Anxiety   . Depression   . H/O: rheumatic fever 2012   (per patient) - eval by [facility] LBBB with PVC's  . Heart block bundle branch   . History of chicken pox   . Hypertension   . Organic impotence          Past Surgical History:  Procedure Laterality Date  . Cautery for nosebleeds    . Right elbow bursa removed    . TONSILLECTOMY AND ADENOIDECTOMY  1947     Medications Prior to  Admission:        Prior to Admission medications   Medication Sig Start Date End Date Taking? Authorizing Provider  acetaminophen (TYLENOL) 500 MG tablet Take 500 mg by mouth every 6 (six) hours as needed for headache (pain).    [provider]  ALPRAZolam (Duanne Moron 0.25 MG tablet Take 0.5 tablets (0.125 mg total) by mouth daily as needed for anxiety. 01/27/18   DTonia Ghent MD  aspirin EC 81 MG tablet Take 1 tablet (81 mg total) by mouth daily. 07/03/15   SBelva Crome MD  cromolyn (OPTICROM) 4 % ophthalmic solution Place 1 drop into both eyes 2 (two) times daily.  05/31/15   [provider]  GUAIFENESIN 1200 PO Take 1,200 mg by mouth daily as needed.    [provider]  hydrocortisone cream 1 % Apply 1 application topically 2 (two) times daily as  needed for itching.    [provider]  metoprolol succinate (TOPROL-XL) 25 MG 24 hr tablet Take 0.5 tablets (12.5 mg total) by mouth daily. 01/27/18   Richard Ghent, MD  naproxen sodium (ALEVE) 220 MG tablet Take 220 mg by mouth 2 (two) times daily as needed (pain).    [provider]  Polyethyl Glycol-Propyl Glycol (SYSTANE) 0.4-0.3 % SOLN Apply 1 drop to eye as needed. 11/28/17   Richard Ghent, MD  polyethylene glycol powder (GLYCOLAX/MIRALAX) powder Take 17 g by mouth 2 (two) times daily as needed. 01/27/18   Richard Ghent, MD     Allergies:         Allergies  Allergen Reactions  . Colchicine Diarrhea  . Fluticasone Propionate Other (See Comments)    He didn't tolerate the taste/smell prev  . Nasacort [Triamcinolone]     Social History:   Social History   Socioeconomic History  . Marital status: Single    Spouse name: Not on file  . Number of children: Not on file  . Years of education: Not on file  . Highest education level: Not on file  Occupational History  . 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 Financial  resource strain: Not on file  . Food insecurity:    Worry: Not on file    Inability: Not on file  . Transportation needs:    Medical: Not on file    Non-medical: Not on file  Tobacco Use  . Smoking status: Former Research scientist (life sciences)  . Smokeless tobacco: Never Used  Substance and Sexual Activity  . Alcohol use: Yes    Alcohol/week: 0.0 standard drinks    Comment: Rare  . Drug use: No  . Sexual activity: Not on file  Lifestyle  . Physical activity:    Days per week: Not on file    Minutes per session: Not on file  . Stress: Not on file  Relationships  . Social connections:    Talks on phone: Not on file    Gets together: Not on file    Attends religious service: Not on file    Active member of club or organization: Not on file    Attends meetings of clubs or organizations: Not on file    Relationship status: Not on file  . Intimate partner violence:    Fear of current or ex partner: Not on file    Emotionally abused: Not on file    Physically abused: Not on file    Forced sexual activity: Not on file  Other Topics Concern  . 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 There is no history of Colon cancer.    ROS:  Please see the history of present illness.  Review of Systems  Constitutional: Positive for malaise/fatigue. Negative for chills, diaphoresis and fever.  HENT: Positive for congestion and tinnitus.   Respiratory: Positive for cough and shortness of breath.   Cardiovascular: Positive for leg swelling. Negative for chest pain, palpitations, orthopnea and PND.    Gastrointestinal: Negative for  blood in stool, nausea and vomiting.  Genitourinary: Negative for hematuria.  Musculoskeletal: Negative for falls and myalgias.  Neurological: Positive for dizziness and weakness. Negative for loss of consciousness.  Endo/Heme/Allergies: Does not bruise/bleed easily.  Psychiatric/Behavioral: Negative for substance abuse (former tobacco abuse).   Physical Exam/Data:         Vitals:   05/28/18 1445 05/28/18 1500 05/28/18 1512 05/28/18 1530  BP: 130/72 115/72 115/72 121/86  Pulse: 80 84 85 83  Resp: (!) 21 (!) 27 (!) 22 19  Temp:      SpO2: 98% 96% 97% 98%  Weight:      Height:       No intake or output data in the 24 hours ending 05/28/18 1610 Last 3 Weights 05/28/2018 04/30/2018 01/27/2018  Weight (lbs) 230 lb 234 lb 12 oz 224 lb 4 oz  Weight (kg) 104.327 kg 106.482 kg 101.719 kg     Body mass index is 29.53 kg/m.  General:  Well nourished, well developed Caucasian male resting comfortably in no acute distress. HEENT: Normocephalic and atraumatic. Sclera clear. No xanthomas noted. EOMs intact. Lymph: No adenopathy. Neck: Supple. No JVD. Endocrine:  No thyromegaly. Vascular: No carotid bruits. Radial pulses 2+ and equal. Cardiac: Irregularly irregular with regular rate. No significant murmurs, gallops, or rubs appreciated. Lungs:  No increased work of breathing. Clear to auscultation bilaterally. No wheezing, rhonchi, or rales.  Abd: Soft, ,mildly distended, and non-tender to palpation. Bowel sounds present. Ext: 1+ to 2+ pitting edema of bilateral lower extremities. Musculoskeletal:  No deformities. Neuro:  Alert and oriented x3. No focal abnormalities noted. Psych:  Normal affect. Responds appropriately.  EKG:  The ECG that was done was personally reviewed and demonstrates atrial fibrillation with ventricular rate of 87 bpm.  Telemetry: Telemetry was personally reviewed and demonstrates atrial fibrillation with ventricular  rates in the 80's to 120's and frequent PVCs.  Relevant CV Studies:  Echocardiogram 10/21/2017: Study Conclusions: - Left ventricle: The cavity size was normal. Septal wall thickness was increased in a pattern of moderate LVH with otherwise mild concentric hypertrophy. Systolic function was normal. The estimated ejection fraction was in the range of 60% to 65%. Wall motion was normal; there were no regional wall motion abnormalities. - Aortic valve: Transvalvular velocity was within the normal range. There was no stenosis. There was mild to moderate regurgitation. - Mitral valve: Transvalvular velocity was within the normal range. There was no evidence for stenosis. There was mild regurgitation. - Left atrium: The atrium was moderately dilated. - Right ventricle: The cavity size was normal. Wall thickness was normal. Systolic function was normal. - Tricuspid valve: There was mild regurgitation. - Pulmonary arteries: Systolic pressure was within the normal range. PA peak pressure: 27 mm Hg (S).  Laboratory Data:  Chemistry LastLabs     Recent Labs  Lab 05/28/18 1107  NA 138  K 4.2  CL 100  CO2 27  GLUCOSE 100*  BUN 13  CREATININE 1.05  CALCIUM 9.1  GFRNONAA >60  GFRAA >60  ANIONGAP 11      Last[facility]  Recent Labs  Lab 05/28/18 1107  PROT 6.7  ALBUMIN 3.5  AST 46*  ALT 24  ALKPHOS 56  BILITOT 3.3*     Hematology Last[facility]     Recent Labs  Lab 05/28/18 1107  WBC 6.9  RBC 4.38  HGB 14.7  HCT 44.8  MCV 102.3*  MCH 33.6  MCHC 32.8  RDW 13.7  PLT 165  Cardiac Enzymes Last[facility]  No results for input(s): TROPONINI in the last 168 hours.    [facility]  Recent Labs  Lab 05/28/18 1123  TROPIPOC 0.00      BNP [facility]     Recent Labs  Lab 05/28/18 1115  BNP 73.5      DDimer  Last[facility]  No results for input(s): DDIMER in the last 168 hours.    Radiology/Studies:  Dg Chest Port 1 View  Result  Date: 05/28/2018 CLINICAL DATA:  Shortness of breath EXAM: PORTABLE CHEST 1 VIEW COMPARISON:  08/29/2016 FINDINGS: Cardiac shadow is enlarged but stable. Elevation of left hemidiaphragm is noted with small left pleural effusion and likely underlying atelectasis. No bony abnormality is seen. IMPRESSION: Mild left pleural effusion and underlying atelectasis. Electronically Signed   By: Inez Catalina M.D.   On: 05/28/2018 11:30    Assessment and Plan:   Atrial Fibrillation with RVR - Patient presents for evaluation of increased fatigue, weakness, and shortness of breath over the last few weeks. Found to be tachycardic at PCP's office earlier today and was transported to ED for further evaluation.  - Initial EKG shows sinus tachycardia/ possible atrial flutter with rate of 121 bpm. Repeat EKG showed atrial fibrillation. However, patient has since gone into atrial fibrillation. - Telemetry shows atrial fibrillation with ventricular rates in the 80's to 120's.  - I-stat troponin negative. - BNP normal. - Potassium 4.2. - Will check TSH. - Received IV Lopressor 53m in the ED with improvement of rate. - Currently on Toprol 12.559mfor frequent PVCs. Will increase to 2521maily. - CHADSVASc = 3 (HTN, age x2). Will start Eliquis 5mg54mice daily. - Will admit for observation overnight. Will give one dose of IV Lasix given lower extremity edema. Will also recheck Echo.   Severity of Illness: The appropriate patient status for this patient is OBSERVATION. Observation status is judged to be reasonable and necessary in order to provide the required intensity of service to ensure the patient's safety. The patient's presenting symptoms, physical exam findings, and initial radiographic and laboratory data in the context of their medical condition is felt to place them at decreased risk for further clinical deterioration. Furthermore, it is anticipated that the patient will be medically stable for discharge from  the hospital within 2 midnights of admission. The following factors support the patient status of observation.   "           The patient's presenting symptoms include fatigue, weakness, and dyspnea. "           The physical exam findings include irregular heart rhythm and lower extremity edema. "           The initial radiographic and laboratory data are unremarkable. EKG showed atrial fibrillation.     For questions or updates, please contact [facility] consult [email or web address] for contact info under        Signed, CallDarreld Saunders-C  05/28/2018 4:10 PM      Cosigned by: TurnSueanne Saunders at 05/28/2018 5:50 PM  Revision History                                  Routing History

## 2018-05-31 DIAGNOSIS — I5031 Acute diastolic (congestive) heart failure: Secondary | ICD-10-CM | POA: Diagnosis not present

## 2018-05-31 DIAGNOSIS — I447 Left bundle-branch block, unspecified: Secondary | ICD-10-CM | POA: Diagnosis not present

## 2018-05-31 DIAGNOSIS — I351 Nonrheumatic aortic (valve) insufficiency: Secondary | ICD-10-CM | POA: Diagnosis not present

## 2018-05-31 DIAGNOSIS — I4891 Unspecified atrial fibrillation: Secondary | ICD-10-CM | POA: Diagnosis not present

## 2018-05-31 LAB — BASIC METABOLIC PANEL
Anion gap: 8 (ref 5–15)
BUN: 15 mg/dL (ref 8–23)
CO2: 34 mmol/L — ABNORMAL HIGH (ref 22–32)
Calcium: 8.9 mg/dL (ref 8.9–10.3)
Chloride: 100 mmol/L (ref 98–111)
Creatinine, Ser: 1.02 mg/dL (ref 0.61–1.24)
GFR calc Af Amer: >60 mL/min (ref >60)
GFR calc non Af Amer: >60 mL/min (ref >60)
Glucose, Bld: 105 mg/dL — ABNORMAL HIGH (ref 70–99)
Potassium: 4 mmol/L (ref 3.5–5.1)
SODIUM: 142 mmol/L (ref 135–145)

## 2018-05-31 MED ORDER — APIXABAN 5 MG PO TABS: 5.0000 mg | ORAL_TABLET | Freq: Two times a day (BID) | ORAL | 3 refills | Status: DC

## 2018-05-31 MED ORDER — METOPROLOL SUCCINATE ER 25 MG PO TB24: 25.0000 mg | ORAL_TABLET | Freq: Every day | ORAL | 3 refills | Status: DC

## 2018-05-31 NOTE — Progress Notes (Signed)
Eliquis 30 day coupon provided to and explained to patient. No other CM needs.

## 2018-05-31 NOTE — Discharge Summary (Signed)
Discharge Summary    Patient ID: CALIXTO Saunders MRN: [id number]; DOB: May 21, 1940  Admit date: 05/28/2018 Discharge date: 05/31/2018  Primary Care Provider: Tonia Ghent, MD  Primary Cardiologist: Sinclair Grooms, MD  Primary Electrophysiologist:  None   Discharge Diagnoses    Principal Problem:   Atrial fibrillation with RVR [facility]) Active Problems:   Aortic regurgitation   Left bundle branch block   Allergies Allergies  Allergen Reactions  . Colchicine Diarrhea  . Fluticasone Propionate Other (See Comments)    He didn't tolerate the taste/smell prev  . Nasacort [Triamcinolone] Other (See Comments)    Didn't tolerate    Diagnostic Studies/Procedures    Echocardiogram 05/29/2018 IMPRESSIONS    1. The left ventricle has normal systolic function of 29-52%. The cavity size is normal. There is no left ventricular wall thickness. Echo evidence of normal diastolic filling patterns.  2. Normal left atrial size.  3. Normal right atrial size.  4. The mitral valve Mildly thickened. Systolic anterior motion anterior mitral leafet. Regurgitation is mild to moderate by color flow Doppler.  5. Normal tricuspid valve.  6. Tricuspid regurgitation is mild.  7. The aortic valve tricuspid. There is mild thickening and mild calcification of the aortic valve. Aortic valve regurgitation is mild by color flow Doppler.  8. No atrial level shunt detected by color flow Doppler. _____________   History of Present Illness     Richard Saunders is a 78 year old male followed by Dr. [name] with a history of hypertension, left bundle branch block, PVCs and a history of rheumatic fever as a child with mild-moderate aortic insufficiency and mild MR with moderately dilated left atrium by echo 10/21/2017.  He apparently had been in his usual state of health until about 3 weeks ago when he started having URI symptoms including nasal congestion and cough.  He says he has chronic sinus issues but  they became much worse 3 weeks ago.  He did not have any fever.  He saw his PCP and started on nasal steroid spray.  He then started developing worsening shortness of breath to the point that he was short of breath just walking to the bathroom or to the car.  He also has noted increasing lower extremity edema over the past 24 to 48 hours including increasing abdominal firmness.  He saw his PCP on 05/28/18 because he was getting worse and he was found to be tachycardic and was sent to the ER where he was found to be in atrial flutter at 121 bpm.  Repeat EKG showed atrial fibrillation at 87 bpm.  Initial troponin was negative and chest x-ray showed no acute findings.  BNP normal.  CBC was normal.  He received 1 dose of IV Lopressor 5 mg in the ER with improvement his heart rate.  Cardiology asked to see.  Hospital Course     Consultants: None  The patient's Toprol-XL was increased to 25 mg daily for better rate control.  He was started on Eliquis 5 mg twice daily for CHA2DS2/VAS Stroke Risk Score of 3 (age (2), HTN). TSH was normal.    Echocardiogram done on 05/29/2018 showed normal LV systolic function with EF 84-13%, normal diastolic filling patterns, normal left and right atrial size, mild-moderate mitral regurgitation, mild TR, mild aortic regurgitation.  Patient continues in atrial fibrillation with rate well controlled in the 70s-80s.  Plan for 4 weeks of uninterrupted anticoagulation with plan for possible DCCV in 3-4 weeks.  Aspirin has been stopped  in the setting of need for anticoagulation.  Patient has been treated for acute diastolic heart failure likely related to new onset atrial fibrillation.  BNP was only 73.5, however, patient has appeared mildly short of breath which he says is at baseline and he has mild lower extremity edema.  His lungs are clear.  For volume overload the patient was diuresed with Lasix 40 mg IV, received 3 doses.  He is net -1.1 L since admission (1.9 L urine output  yesterday).  We will discharge the patient home with Lasix 20 mg daily. Follow up in afib clinic this week.   Of note the patient thought he was dehydrated prior to admission and significantly increased his fluid intake.  Patient has been seen by Dr. [name] today and deemed ready for discharge home. All follow up appointments have been scheduled. Discharge medications are listed below.  Message was sent to [name] to arrange follow-up in the A. fib clinic this week. _____________  Discharge Vitals Blood pressure 99/65, pulse (!) 58, temperature 98.2 F (36.8 C), temperature source Oral, resp. rate 18, height 6' 1"  (1.854 m), weight 107.9 kg, SpO2 94 %.  Filed Weights   05/29/18 0424 05/30/18 0443 05/31/18 0531  Weight: 108.5 kg 109.1 kg 107.9 kg   Physical Exam  Constitutional: He is oriented to person, place, and time. He appears well-developed and well-nourished.  Obese male  HENT:  Head: Normocephalic and atraumatic.  Neck: Normal range of motion. Neck supple. No JVD present.  Cardiovascular: Normal rate, normal heart sounds and intact distal pulses. An irregularly irregular rhythm present. Exam reveals no gallop and no friction rub.  No murmur heard. Pulmonary/Chest: Breath sounds normal. No respiratory distress. He has no wheezes. He has no rales.  Appearance of slightly increased breathing effort.  Patient says this is baseline for him for several years.  Abdominal: Soft. Bowel sounds are normal.  Musculoskeletal: Normal range of motion.        General: Edema present.     Comments: Trace pretibial/ankle edema, bilateral  Neurological: He is alert and oriented to person, place, and time.  Skin: Skin is warm and dry.  Psychiatric: He has a normal mood and affect. His behavior is normal. Judgment and thought content normal.  Vitals reviewed.   Labs & Radiologic Studies    CBC No results for input(s): WBC, NEUTROABS, HGB, HCT, MCV, PLT in the last 72 hours. Basic  Metabolic Panel Recent Labs    05/29/18 0541 05/31/18 0520  NA 140 142  K 4.4 4.0  CL 100 100  CO2 31 34*  GLUCOSE 103* 105*  BUN 16 15  CREATININE 1.02 1.02  CALCIUM 8.8* 8.9   Liver Function Tests No results for input(s): AST, ALT, ALKPHOS, BILITOT, PROT, ALBUMIN in the last 72 hours. No results for input(s): LIPASE, AMYLASE in the last 72 hours. Cardiac Enzymes No results for input(s): CKTOTAL, CKMB, CKMBINDEX, TROPONINI in the last 72 hours. BNP Invalid input(s): POCBNP D-Dimer No results for input(s): DDIMER in the last 72 hours. Hemoglobin A1C No results for input(s): HGBA1C in the last 72 hours. Fasting Lipid Panel No results for input(s): CHOL, HDL, LDLCALC, TRIG, CHOLHDL, LDLDIRECT in the last 72 hours. Thyroid Function Tests Recent Labs    05/29/18 0541  TSH 1.773   _____________  Dg Chest Port 1 View  Result Date: 05/28/2018 CLINICAL DATA:  Shortness of breath EXAM: PORTABLE CHEST 1 VIEW COMPARISON:  08/29/2016 FINDINGS: Cardiac shadow is enlarged but stable. Elevation  of left hemidiaphragm is noted with small left pleural effusion and likely underlying atelectasis. No bony abnormality is seen. IMPRESSION: Mild left pleural effusion and underlying atelectasis. Electronically Signed   By: Inez Catalina M.D.   On: 05/28/2018 11:30   Disposition   Pt is being discharged home today in good condition.  Follow-up Plans & Appointments    Follow-up Information    [facility] Follow up.   Specialty:  Cardiology Why:  The office will call you for follow-up of atrial fibrillation this week.  If you are not called by Tuesday morning please call the office to schedule an appointment for this week. Contact information: [address] [id number] [place] [phone]         Discharge Instructions    (HEART FAILURE PATIENTS) Call MD:  Anytime you have any of the following symptoms: 1) 3 pound weight gain  in 24 hours or 5 pounds in 1 week 2) shortness of breath, with or without a dry hacking cough 3) swelling in the hands, feet or stomach 4) if you have to sleep on extra pillows at night in order to breathe.   Complete by:  As directed    Diet - low sodium heart healthy   Complete by:  As directed    Increase activity slowly   Complete by:  As directed       Discharge Medications   Allergies as of 05/31/2018      Reactions   Colchicine Diarrhea   Fluticasone Propionate Other (See Comments)   He didn't tolerate the taste/smell prev   Nasacort [triamcinolone] Other (See Comments)   Didn't tolerate      Medication List    STOP taking these medications   aspirin EC 81 MG tablet     TAKE these medications   acetaminophen 500 MG tablet Commonly known as:  TYLENOL Take 500 mg by mouth every 6 (six) hours as needed for headache (pain).   ALPRAZolam 0.25 MG tablet Commonly known as:  XANAX Take 0.5 tablets (0.125 mg total) by mouth daily as needed for anxiety.   apixaban 5 MG Tabs tablet Commonly known as:  ELIQUIS Take 1 tablet (5 mg total) by mouth 2 (two) times daily.   cromolyn 4 % ophthalmic solution Commonly known as:  OPTICROM Place 1 drop into both eyes 2 (two) times daily.   metoprolol succinate 25 MG 24 hr tablet Commonly known as:  TOPROL-XL Take 1 tablet (25 mg total) by mouth daily. Start taking on:  June 01, 2018 What changed:  how much to take   Polyethyl Glycol-Propyl Glycol 0.4-0.3 % Soln Commonly known as:  SYSTANE Apply 1 drop to eye as needed.   polyethylene glycol powder powder Commonly known as:  GLYCOLAX/MIRALAX Take 17 g by mouth 2 (two) times daily as needed.        Acute coronary syndrome (MI, NSTEMI, STEMI, etc) this admission?: No.    Outstanding Labs/Studies   none  Duration of Discharge Encounter   Greater than 30 minutes including physician time.  Signed, Daune Perch, NP 05/31/2018, 1:38 PM

## 2018-06-01 ENCOUNTER — Encounter (HOSPITAL_COMMUNITY): Payer: Self-pay

## 2018-06-02 ENCOUNTER — Encounter (HOSPITAL_COMMUNITY): Payer: Self-pay | Admitting: Physician Assistant

## 2018-06-02 ENCOUNTER — Ambulatory Visit (HOSPITAL_COMMUNITY)
Admission: RE | Admit: 2018-06-02 | Discharge: 2018-06-02 | Disposition: A | Discharge status: Home / Self Care | Payer: Medicare HMO | Source: Ambulatory Visit | Attending: Physician Assistant | Admitting: Physician Assistant

## 2018-06-02 VITALS — BP 136/86 | HR 96 | Ht 73.0 in | Wt 243.0 lb

## 2018-06-02 DIAGNOSIS — F419 Anxiety disorder, unspecified: Secondary | ICD-10-CM | POA: Insufficient documentation

## 2018-06-02 DIAGNOSIS — I358 Other nonrheumatic aortic valve disorders: Secondary | ICD-10-CM | POA: Insufficient documentation

## 2018-06-02 DIAGNOSIS — I10 Essential (primary) hypertension: Secondary | ICD-10-CM | POA: Diagnosis not present

## 2018-06-02 DIAGNOSIS — R600 Localized edema: Secondary | ICD-10-CM | POA: Insufficient documentation

## 2018-06-02 DIAGNOSIS — Z87891 Personal history of nicotine dependence: Secondary | ICD-10-CM | POA: Insufficient documentation

## 2018-06-02 DIAGNOSIS — Z7901 Long term (current) use of anticoagulants: Secondary | ICD-10-CM | POA: Diagnosis not present

## 2018-06-02 DIAGNOSIS — I4819 Other persistent atrial fibrillation: Secondary | ICD-10-CM

## 2018-06-02 DIAGNOSIS — I447 Left bundle-branch block, unspecified: Secondary | ICD-10-CM | POA: Insufficient documentation

## 2018-06-02 DIAGNOSIS — Z8249 Family history of ischemic heart disease and other diseases of the circulatory system: Secondary | ICD-10-CM | POA: Diagnosis not present

## 2018-06-02 DIAGNOSIS — Z79899 Other long term (current) drug therapy: Secondary | ICD-10-CM | POA: Insufficient documentation

## 2018-06-02 MED ORDER — FUROSEMIDE 20 MG PO TABS: 20.0000 mg | ORAL_TABLET | Freq: Every day | ORAL | 2 refills | Status: DC

## 2018-06-02 MED ORDER — APIXABAN 5 MG PO TABS: 5.0000 mg | ORAL_TABLET | Freq: Two times a day (BID) | ORAL | 3 refills | Status: AC

## 2018-06-02 MED ORDER — METOPROLOL SUCCINATE ER 25 MG PO TB24: 37.5000 mg | ORAL_TABLET | Freq: Every day | ORAL | 3 refills | Status: DC

## 2018-06-02 NOTE — Progress Notes (Signed)
Primary Care Physician: Tonia Ghent, MD Primary Cardiologist: Dr [name] Referring Physician: Hospital follow up   Richard Saunders is a 78 y.o. male with a history of persistent atrial fibrillation, HTN, LBBB, aortic insufficiency, and a history of rheumatic fever who presents for consultation in the [facility].  The patient was initially diagnosed with atrial fibrillation on 05/28/18 after presenting to his PCP office with fluid overload and tachycardia. He was sent to the ER where initial ECG showed he was in atrial flutter with HR 121. Repeat ECG showed atrial fibrillation HR 87. He was started on Eliquis and metoprolol.Patient states that he had an URI and was started on a nasal steroid just prior to the onset of his afib. He continues to have lower extremity edema although his SOB has improved. He is in atrial fibrillation today.  Today, he denies symptoms of palpitations, chest pain, shortness of breath, orthopnea, PND, dizziness, presyncope, syncope, snoring, daytime somnolence, bleeding, or neurologic sequela. The patient is tolerating medications without difficulties and is otherwise without complaint today.    Atrial Fibrillation Risk Factors:  he does not have symptoms or diagnosis of sleep apnea. he does have a history of rheumatic fever. he does not have a history of alcohol use. he has a BMI of Body mass index is 32.06 kg/m.Marland Kitchen Filed Weights   06/02/18 1345  Weight: 110.2 kg   Family History  Problem Relation Age of Onset  . Cancer Mother        ovarian, uterine  . Aneurysm Mother        AAA in 74  . Cancer Father        Prostate  . COPD Father   . Prostate cancer Father   . Diabetes Other   . Heart disease Other        CAD <60 male  . Hypertension Other   . Heart disease Maternal Grandfather        died from possible "heart attack" in early 81's  . Heart failure Maternal Uncle   . Colon cancer Neg Hx    The patient does  not have a history of early familial atrial fibrillation or other arrhythmias.   Atrial Fibrillation Management history:  Previous antiarrhythmic drugs: none Previous cardioversions: none Previous ablations: none CHADS2VASC score: 3 Anticoagulation history: Eliquis   Past Medical History:  Diagnosis Date  . Allergy   . Anxiety   . Aortic insufficiency    a. 04/2018 Echo: EF 55-60%. Mild to Mod MR. Mild AI/TR.  Marland Kitchen Atrial fibrillation ([place])    a. Dx 04/2018. CHA2DS2VASc = 4-->Eliquis.  . Depression   . H/O: rheumatic fever 2012   (per patient) - eval by [facility] LBBB with PVC's  . History of chicken pox   . Hypertension   . LBBB (left bundle branch block)   . Organic impotence   . PVC's (premature ventricular contractions)    Past Surgical History:  Procedure Laterality Date  . Cautery for nosebleeds    . Right elbow bursa removed    . TONSILLECTOMY AND ADENOIDECTOMY  1947    Current Outpatient Medications  Medication Sig Dispense Refill  . acetaminophen (TYLENOL) 500 MG tablet Take 500 mg by mouth every 6 (six) hours as needed for headache (pain).    Marland Kitchen ALPRAZolam (XANAX) 0.25 MG tablet Take 0.5 tablets (0.125 mg total) by mouth daily as needed for anxiety.    Marland Kitchen apixaban (ELIQUIS) 5 MG TABS tablet Take  1 tablet (5 mg total) by mouth 2 (two) times daily. 180 tablet 3  . cromolyn (OPTICROM) 4 % ophthalmic solution Place 1 drop into both eyes 2 (two) times daily.   2  . metoprolol succinate (TOPROL-XL) 25 MG 24 hr tablet Take 1.5 tablets (37.5 mg total) by mouth daily. 135 tablet 3  . Polyethyl Glycol-Propyl Glycol (SYSTANE) 0.4-0.3 % SOLN Apply 1 drop to eye as needed. 10 mL 1  . polyethylene glycol powder (GLYCOLAX/MIRALAX) powder Take 17 g by mouth 2 (two) times daily as needed.    . furosemide (LASIX) 20 MG tablet Take 1 tablet (20 mg total) by mouth daily. May take extra tablet as needed for swelling/wt gain 35 tablet 2   No current facility-administered medications for  this encounter.     Allergies  Allergen Reactions  . Colchicine Diarrhea  . Fluticasone Propionate Other (See Comments)    He didn't tolerate the taste/smell prev  . Nasacort [Triamcinolone] Other (See Comments)    Didn't tolerate    Social History   Socioeconomic History  . Marital status: Single    Spouse name: Not on file  . Number of children: Not on file  . Years of education: Not on file  . Highest education level: Not on file  Occupational History  . 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 Financial resource strain: Not on file  . Food insecurity:    Worry: Not on file    Inability: Not on file  . Transportation needs:    Medical: Not on file    Non-medical: Not on file  Tobacco Use  . Smoking status: Former Smoker    Years: 40.00    Last attempt to quit: 04/29/1978    Years since quitting: 40.1  . Smokeless tobacco: Never Used  Substance and Sexual Activity  . Alcohol use: Yes    Alcohol/week: 0.0 standard drinks    Comment: Rare  . Drug use: No  . Sexual activity: Not on file  Lifestyle  . Physical activity:    Days per week: Not on file    Minutes per session: Not on file  . Stress: Not on file  Relationships  . Social connections:    Talks on phone: Not on file    Gets together: Not on file    Attends religious service: Not on file    Active member of club or organization: Not on file    Attends meetings of clubs or organizations: Not on file    Relationship status: Not on file  . Intimate partner violence:    Fear of current or ex partner: Not on file    Emotionally abused: Not on file    Physically abused: Not on file    Forced sexual activity: Not on file  Other Topics Concern  . Not on file  Social History Narrative   Education:  [employer]   Regular Exercise:  No   Enjoys travel / riding motorcycle / [place] are reviewed and negative except as per the HPI above.  Physical  Exam: Vitals:   06/02/18 1345  BP: 136/86  Pulse: 96  SpO2: 91%  Weight: 110.2 kg  Height: 6' 1"  (1.854 m)    GEN- The patient is well appearing, alert and oriented x 3 today.   Head- normocephalic, atraumatic Eyes-  Sclera clear, conjunctiva pink Ears- hearing intact Oropharynx- clear Neck- supple  Lungs-  Clear to ausculation bilaterally, normal work of breathing Heart- irregular rate and rhythm, no murmurs, rubs or gallops  GI- soft, NT, ND, + BS Extremities- no clubbing, cyanosis, 1+ edema MS- no significant deformity or atrophy Skin- no rash or lesion Psych- euthymic mood, full affect Neuro- strength and sensation are intact  Wt Readings from Last 3 Encounters:  06/02/18 110.2 kg  05/31/18 107.9 kg  04/30/18 106.5 kg    EKG today demonstrates afib HR 96, PCVs, QRS 88, QTc 439  Echo 05/29/18 demonstrated   1. The left ventricle has normal systolic function of 72-09%. The cavity size is normal. There is no left ventricular wall thickness. Echo evidence of normal diastolic filling patterns.  2. Normal left atrial size.  3. Normal right atrial size.  4. The mitral valve Mildly thickened. Systolic anterior motion anterior mitral leafet. Regurgitation is mild to moderate by color flow Doppler.  5. Normal tricuspid valve.  6. Tricuspid regurgitation is mild.  7. The aortic valve tricuspid. There is mild thickening and mild calcification of the aortic valve. Aortic valve regurgitation is mild by color flow Doppler.  8. No atrial level shunt detected by color flow Doppler.  Epic records are reviewed at length today  Assessment and Plan:  1. Persistent atrial fibrillation New diagnosis in setting of URI and nasal steroid. Patient unaware of arrhythmia but he has had more lower extremity edema. Will increase Toprol to 37.5 mg daily Continue Eliquis 5 mg BID Will arrange DCCV 3 weeks from start of anticoagulation. Check CBC/Bmet on follow up.  This patients CHA2DS2-VASc  Score and unadjusted Ischemic Stroke Rate (% per year) is equal to 3.2 % stroke rate/year from a score of 3  Above score calculated as 1 point each if present [CHF, HTN, DM, Vascular=MI/PAD/Aortic Plaque, Age if 65-74, or Male] Above score calculated as 2 points each if present [Age > 75, or Stroke/TIA/TE]   2. HTN Stable, changes as above.  3. Aortic Valve disease Mild by last echo 1/30. Followed by Dr [name].  4. Lower extremity edema EF 55-60% by last echo.  Will start Lasix 20 mg daily Check Bmet on follow up.  Follow up in two weeks for pre DCCV H&P and labs.  [facility] [address] [place], [place] [address] [phone] 06/02/2018 2:34 PM

## 2018-06-02 NOTE — Patient Instructions (Signed)
Increase metoprolol to 37.64m a day  Start lasix 270m-- take 2 tablets a day for the next 2 days then reduce to once a day

## 2018-06-05 ENCOUNTER — Encounter: Payer: Self-pay | Admitting: Family Medicine

## 2018-06-05 ENCOUNTER — Ambulatory Visit (INDEPENDENT_AMBULATORY_CARE_PROVIDER_SITE_OTHER): Payer: Medicare HMO | Admitting: Family Medicine

## 2018-06-05 VITALS — BP 118/80 | HR 72 | Temp 97.9°F | Ht 73.0 in | Wt 243.0 lb

## 2018-06-05 DIAGNOSIS — R0602 Shortness of breath: Secondary | ICD-10-CM

## 2018-06-05 DIAGNOSIS — I4891 Unspecified atrial fibrillation: Secondary | ICD-10-CM

## 2018-06-05 LAB — CBC WITH DIFFERENTIAL/PLATELET
Basophils Absolute: 0.1 10*3/uL (ref 0.0–0.1)
Basophils Relative: 0.8 % (ref 0.0–3.0)
Eosinophils Absolute: 0.1 10*3/uL (ref 0.0–0.7)
Eosinophils Relative: 2.1 % (ref 0.0–5.0)
HEMATOCRIT: 43.9 % (ref 39.0–52.0)
Hemoglobin: 14.8 g/dL (ref 13.0–17.0)
LYMPHS ABS: 1.3 10*3/uL (ref 0.7–4.0)
Lymphocytes Relative: 19 % (ref 12.0–46.0)
MCHC: 33.6 g/dL (ref 30.0–36.0)
MCV: 102.6 fl — AB (ref 78.0–100.0)
Monocytes Absolute: 0.7 10*3/uL (ref 0.1–1.0)
Monocytes Relative: 10.6 % (ref 3.0–12.0)
Neutro Abs: 4.5 10*3/uL (ref 1.4–7.7)
Neutrophils Relative %: 67.5 % (ref 43.0–77.0)
Platelets: 168 10*3/uL (ref 150.0–400.0)
RBC: 4.28 Mil/uL (ref 4.22–5.81)
RDW: 14.5 % (ref 11.5–15.5)
WBC: 6.7 10*3/uL (ref 4.0–10.5)

## 2018-06-05 LAB — BASIC METABOLIC PANEL
BUN: 24 mg/dL — AB (ref 6–23)
CO2: 37 mEq/L — ABNORMAL HIGH (ref 19–32)
CREATININE: 0.92 mg/dL (ref 0.40–1.50)
Calcium: 9.6 mg/dL (ref 8.4–10.5)
Chloride: 100 mEq/L (ref 96–112)
GFR: 79.73 mL/min (ref >60.00)
GLUCOSE: 108 mg/dL — AB (ref 70–99)
Potassium: 5.1 mEq/L (ref 3.5–5.1)
Sodium: 142 mEq/L (ref 135–145)

## 2018-06-05 LAB — BRAIN NATRIURETIC PEPTIDE: Pro B Natriuretic peptide (BNP): 116 pg/mL — ABNORMAL HIGH (ref 0.0–100.0)

## 2018-06-05 NOTE — Patient Instructions (Signed)
Go to the lab on the way out.  We'll contact you with your lab report. Plan on checking your weight each AM.   In the meantime, take lasix 44m a day.   Continue 447ma day until the swelling is better.   If swelling is significantly better, then take 2054m day.  Update me Monday about your swelling and breathing.  If you have more swelling or trouble breathing then go to the ER.  Take care.  Glad to see you.

## 2018-06-05 NOTE — Progress Notes (Signed)
Hospital follow-up.  In retrospect he had been getting worse in the last few weeks. He now sees a difference in his breathing and his prev cough.    He was prev off nasal steroid.  He has a return of a "wheeze" from post nasal gtt.  He thought that was prev addressed by the nasal steroid.    At last office visit he was found to be tachycardic and required admission.  Inpatient course discussed with patient.  He had new onset atrial fibrillation/flutter. Rates controlled on metoprolol.  He was started on Eliquis this admission.  Thyroid function was normal and echo revealed normal systolic function and mild-moderate MR.  He received IV lasix 2/1 and appeared more comfortable, though he never reported dyspnea.  He still had mild LE edema but no more crackles on exam at that point.    He continued Lasix 53m po daily.   Plan for cardioversion per cardiology.  Taking lasix daily, he tried 2 tabs yesterday with inc UOP then (but not on 20 mg daily dosing).  He still has BLE edema.   SOB noted with walking.  Some cough.  No CP.    PMH and SH reviewed  ROS: Per HPI unless specifically indicated in ROS section   Meds, vitals, and allergies reviewed.   GEN: nad, alert and oriented HEENT: mucous membranes moist NECK: supple w/o LA, he does have upper airway noise noted CV: IRR, not tachy PULM: ctab, no inc wob ABD: soft, +bs EXT: 1+ BLE edema.   SKIN: no acute rash, well perfused

## 2018-06-07 MED ORDER — FUROSEMIDE 20 MG PO TABS: 40.0000 mg | ORAL_TABLET | Freq: Every day | ORAL | Status: DC

## 2018-06-07 NOTE — Assessment & Plan Note (Signed)
Now rate controlled and anticoagulated with increasing urine output when he took 40 mg of Lasix but not on 20 mg dosing.  He still has some shortness of breath with walking.  He still has some bilateral lower extremity edema.  Discussed options.  He does not appear to need hospitalization at this point.  Recheck routine labs today.  Increase Lasix in the meantime to 40 mg a day.  I want him to go home and take 40 mg today.  See notes on labs.  Rationale for treatment discussed with patient.  Routine emergency room cautions given.  If worsening in the meantime then I want him to go to the emergency room.  If he has dramatic decrease in swelling he can cut back to Lasix 20 mg a day.  He still has some upper airway noise that could be related to stopping his nasal steroid.  I do not think his overall condition with A. fib was related to the nasal steroid use.  I think he has 2 separate issues.  >25 minutes spent in face to face time with patient, >50% spent in counselling or coordination of care.

## 2018-06-08 ENCOUNTER — Telehealth: Payer: Self-pay | Admitting: Family Medicine

## 2018-06-08 NOTE — Telephone Encounter (Signed)
Pt need to speak to nurse concerning his condition,

## 2018-06-08 NOTE — Telephone Encounter (Signed)
Patient advised. Appointment scheduled.  

## 2018-06-08 NOTE — Telephone Encounter (Signed)
Patient says he was to report his condition today upon taking 2 Lasix 20 mg (Total 40 mg). Patient says when he took 2 tablets on Friday, he seemed to urinate a lot, then when he took 2 on Saturday, he didn't urinate as much and today, he hasn't urinate at as much.  There does still seem to be some fluid in his lower body but not as bad.  Patient's weight was 240 on Saturday, 240 on Sunday and 238 today. Patient asks if he should increase Lasix further?

## 2018-06-08 NOTE — Telephone Encounter (Signed)
Would try lasix 3 tabs a day for the next 1-2 days and then recheck here in the clinic later this week.  Thanks.

## 2018-06-11 ENCOUNTER — Encounter: Payer: Self-pay | Admitting: Family Medicine

## 2018-06-11 ENCOUNTER — Ambulatory Visit (INDEPENDENT_AMBULATORY_CARE_PROVIDER_SITE_OTHER): Payer: Medicare HMO | Admitting: Family Medicine

## 2018-06-11 VITALS — BP 112/70 | HR 71 | Temp 97.6°F | Ht 73.0 in | Wt 240.4 lb

## 2018-06-11 DIAGNOSIS — R140 Abdominal distension (gaseous): Secondary | ICD-10-CM

## 2018-06-11 NOTE — Patient Instructions (Signed)
Go to the lab on the way out.  We'll contact you with your lab report. Then see Richard Saunders on the way out about the ultrasound.  Don't change your meds for now.  Take care.  Glad to see you.

## 2018-06-11 NOTE — Progress Notes (Signed)
History of atrial fibrillation with subsequent anticoagulation and attempt at oral diuresis as outpatient.  He had inc UOP with higher dose of lasix.  He is down 3 lbs from prev.  Pulse has been in the mid 70s recently on home check.  He still has a cough and BLE edema.  He is not having chest pain but he still has abd bloating.  No pain.   Diuresis with Lasix has not made a significant change in his lower extremity edema or his bloating.  PMH and SH reviewed  ROS: Per HPI unless specifically indicated in ROS section   Meds, vitals, and allergies reviewed.   GEN: nad, alert and oriented HEENT: mucous membranes moist NECK: supple w/o LA CV: IRR, not tachy.  PULM: ctab, no inc wob ABD: abd soft, bloated with normal BS and not ttp EXT: 2+ BLE edema.  SKIN: no acute rash.  Not jaundiced.

## 2018-06-12 ENCOUNTER — Other Ambulatory Visit: Payer: Self-pay | Admitting: Family Medicine

## 2018-06-12 ENCOUNTER — Telehealth: Payer: Self-pay | Admitting: Family Medicine

## 2018-06-12 ENCOUNTER — Encounter: Payer: Self-pay | Admitting: *Deleted

## 2018-06-12 LAB — COMPREHENSIVE METABOLIC PANEL
ALT: 19 U/L (ref 0–53)
AST: 37 U/L (ref 0–37)
Albumin: 3.5 g/dL (ref 3.5–5.2)
Alkaline Phosphatase: 62 U/L (ref 39–117)
BUN: 29 mg/dL — ABNORMAL HIGH (ref 6–23)
CO2: 36 mEq/L — ABNORMAL HIGH (ref 19–32)
Calcium: 9.5 mg/dL (ref 8.4–10.5)
Chloride: 98 mEq/L (ref 96–112)
Creatinine, Ser: 1.01 mg/dL (ref 0.40–1.50)
GFR: 71.59 mL/min (ref >60.00)
GLUCOSE: 104 mg/dL — AB (ref 70–99)
Potassium: 4.6 mEq/L (ref 3.5–5.1)
Sodium: 143 mEq/L (ref 135–145)
Total Bilirubin: 2.7 mg/dL — ABNORMAL HIGH (ref 0.2–1.2)
Total Protein: 6.7 g/dL (ref 6.0–8.3)

## 2018-06-12 LAB — CBC WITH DIFFERENTIAL/PLATELET
Basophils Absolute: 0 10*3/uL (ref 0.0–0.1)
Basophils Relative: 0.3 % (ref 0.0–3.0)
Eosinophils Absolute: 0.1 10*3/uL (ref 0.0–0.7)
Eosinophils Relative: 1.6 % (ref 0.0–5.0)
HCT: 45 % (ref 39.0–52.0)
Hemoglobin: 15.1 g/dL (ref 13.0–17.0)
LYMPHS ABS: 1.4 10*3/uL (ref 0.7–4.0)
Lymphocytes Relative: 16.9 % (ref 12.0–46.0)
MCHC: 33.5 g/dL (ref 30.0–36.0)
MCV: 102.7 fl — ABNORMAL HIGH (ref 78.0–100.0)
Monocytes Absolute: 0.8 10*3/uL (ref 0.1–1.0)
Monocytes Relative: 10.2 % (ref 3.0–12.0)
NEUTROS PCT: 71 % (ref 43.0–77.0)
Neutro Abs: 5.7 10*3/uL (ref 1.4–7.7)
Platelets: 186 10*3/uL (ref 150.0–400.0)
RBC: 4.38 Mil/uL (ref 4.22–5.81)
RDW: 14.4 % (ref 11.5–15.5)
WBC: 8 10*3/uL (ref 4.0–10.5)

## 2018-06-12 LAB — BRAIN NATRIURETIC PEPTIDE: Pro B Natriuretic peptide (BNP): 81 pg/mL (ref 0.0–100.0)

## 2018-06-12 MED ORDER — SPIRONOLACTONE 25 MG PO TABS: 12.5000 mg | ORAL_TABLET | Freq: Every day | ORAL | 0 refills | Status: DC

## 2018-06-12 MED ORDER — FUROSEMIDE 20 MG PO TABS: 40.0000 mg | ORAL_TABLET | Freq: Every day | ORAL | Status: DC

## 2018-06-12 NOTE — Telephone Encounter (Signed)
Pt is requesting a call from Dr.Duncan's CMA to discuss his treatment for the fluid build up he has.

## 2018-06-14 ENCOUNTER — Encounter: Payer: Self-pay | Admitting: Family Medicine

## 2018-06-14 DIAGNOSIS — R140 Abdominal distension (gaseous): Secondary | ICD-10-CM | POA: Insufficient documentation

## 2018-06-14 NOTE — Assessment & Plan Note (Signed)
It may be that he would diurese more effectively with addition of spironolactone.  See notes on labs I would add on spironolactone 12.57m daily, decrease lasix to 459ma day for now.      I want him to update me Monday about his fluid status and symptoms. If lightheaded with the addition of spironolactone, then cut the lasix back to 2070m day.    He has cardiology follow-up pending.  Check liver ultrasound given the bloating. >25 minutes spent in face to face time with patient, >50% spent in counselling or coordination of care

## 2018-06-15 NOTE — Telephone Encounter (Signed)
Pt called office in regards to being seen on 2/13. Pt was told to call and give an update. Pt states he is still experiencing swelling in his feet and legs. He has dry mouth constantly and his weight has not changed.

## 2018-06-16 ENCOUNTER — Ambulatory Visit (HOSPITAL_BASED_OUTPATIENT_CLINIC_OR_DEPARTMENT_OTHER)
Admission: RE | Admit: 2018-06-16 | Discharge: 2018-06-16 | Disposition: A | Discharge status: Home / Self Care | Payer: Medicare HMO | Source: Ambulatory Visit | Attending: Physician Assistant | Admitting: Physician Assistant

## 2018-06-16 ENCOUNTER — Ambulatory Visit (HOSPITAL_COMMUNITY)
Admission: RE | Admit: 2018-06-16 | Discharge: 2018-06-16 | Disposition: A | Discharge status: Home / Self Care | Payer: Medicare HMO | Source: Ambulatory Visit | Attending: Physician Assistant | Admitting: Physician Assistant

## 2018-06-16 ENCOUNTER — Encounter (HOSPITAL_COMMUNITY): Payer: Self-pay | Admitting: Physician Assistant

## 2018-06-16 VITALS — BP 130/80 | HR 73 | Ht 73.0 in | Wt 240.0 lb

## 2018-06-16 DIAGNOSIS — Z8249 Family history of ischemic heart disease and other diseases of the circulatory system: Secondary | ICD-10-CM | POA: Diagnosis not present

## 2018-06-16 DIAGNOSIS — Z8042 Family history of malignant neoplasm of prostate: Secondary | ICD-10-CM | POA: Diagnosis not present

## 2018-06-16 DIAGNOSIS — Z7901 Long term (current) use of anticoagulants: Secondary | ICD-10-CM | POA: Diagnosis not present

## 2018-06-16 DIAGNOSIS — K7031 Alcoholic cirrhosis of liver with ascites: Secondary | ICD-10-CM | POA: Diagnosis not present

## 2018-06-16 DIAGNOSIS — Z87891 Personal history of nicotine dependence: Secondary | ICD-10-CM | POA: Diagnosis not present

## 2018-06-16 DIAGNOSIS — I071 Rheumatic tricuspid insufficiency: Secondary | ICD-10-CM | POA: Insufficient documentation

## 2018-06-16 DIAGNOSIS — I10 Essential (primary) hypertension: Secondary | ICD-10-CM | POA: Diagnosis not present

## 2018-06-16 DIAGNOSIS — F0390 Unspecified dementia without behavioral disturbance: Secondary | ICD-10-CM | POA: Diagnosis present

## 2018-06-16 DIAGNOSIS — I099 Rheumatic heart disease, unspecified: Secondary | ICD-10-CM | POA: Diagnosis present

## 2018-06-16 DIAGNOSIS — I5033 Acute on chronic diastolic (congestive) heart failure: Secondary | ICD-10-CM | POA: Diagnosis present

## 2018-06-16 DIAGNOSIS — I4819 Other persistent atrial fibrillation: Secondary | ICD-10-CM

## 2018-06-16 DIAGNOSIS — I110 Hypertensive heart disease with heart failure: Secondary | ICD-10-CM | POA: Diagnosis present

## 2018-06-16 DIAGNOSIS — I4892 Unspecified atrial flutter: Secondary | ICD-10-CM | POA: Diagnosis present

## 2018-06-16 DIAGNOSIS — Z79899 Other long term (current) drug therapy: Secondary | ICD-10-CM

## 2018-06-16 DIAGNOSIS — K5730 Diverticulosis of large intestine without perforation or abscess without bleeding: Secondary | ICD-10-CM | POA: Diagnosis present

## 2018-06-16 DIAGNOSIS — Z888 Allergy status to other drugs, medicaments and biological substances status: Secondary | ICD-10-CM | POA: Diagnosis not present

## 2018-06-16 DIAGNOSIS — I493 Ventricular premature depolarization: Secondary | ICD-10-CM

## 2018-06-16 DIAGNOSIS — R0600 Dyspnea, unspecified: Secondary | ICD-10-CM

## 2018-06-16 DIAGNOSIS — R0602 Shortness of breath: Secondary | ICD-10-CM | POA: Diagnosis present

## 2018-06-16 DIAGNOSIS — I480 Paroxysmal atrial fibrillation: Secondary | ICD-10-CM | POA: Diagnosis not present

## 2018-06-16 DIAGNOSIS — R600 Localized edema: Secondary | ICD-10-CM

## 2018-06-16 DIAGNOSIS — K5909 Other constipation: Secondary | ICD-10-CM | POA: Diagnosis present

## 2018-06-16 DIAGNOSIS — E440 Moderate protein-calorie malnutrition: Secondary | ICD-10-CM | POA: Diagnosis present

## 2018-06-16 DIAGNOSIS — Z6828 Body mass index (BMI) 28.0-28.9, adult: Secondary | ICD-10-CM | POA: Diagnosis not present

## 2018-06-16 DIAGNOSIS — I4891 Unspecified atrial fibrillation: Secondary | ICD-10-CM | POA: Diagnosis not present

## 2018-06-16 DIAGNOSIS — K7460 Unspecified cirrhosis of liver: Secondary | ICD-10-CM | POA: Diagnosis present

## 2018-06-16 DIAGNOSIS — I358 Other nonrheumatic aortic valve disorders: Secondary | ICD-10-CM | POA: Insufficient documentation

## 2018-06-16 DIAGNOSIS — Z825 Family history of asthma and other chronic lower respiratory diseases: Secondary | ICD-10-CM | POA: Diagnosis not present

## 2018-06-16 DIAGNOSIS — I447 Left bundle-branch block, unspecified: Secondary | ICD-10-CM

## 2018-06-16 DIAGNOSIS — K7581 Nonalcoholic steatohepatitis (NASH): Secondary | ICD-10-CM | POA: Diagnosis present

## 2018-06-16 DIAGNOSIS — I351 Nonrheumatic aortic (valve) insufficiency: Secondary | ICD-10-CM

## 2018-06-16 DIAGNOSIS — I479 Paroxysmal tachycardia, unspecified: Secondary | ICD-10-CM | POA: Diagnosis not present

## 2018-06-16 DIAGNOSIS — R188 Other ascites: Secondary | ICD-10-CM | POA: Diagnosis present

## 2018-06-16 DIAGNOSIS — E669 Obesity, unspecified: Secondary | ICD-10-CM | POA: Diagnosis present

## 2018-06-16 DIAGNOSIS — I471 Supraventricular tachycardia: Secondary | ICD-10-CM | POA: Diagnosis not present

## 2018-06-16 DIAGNOSIS — J90 Pleural effusion, not elsewhere classified: Secondary | ICD-10-CM | POA: Diagnosis not present

## 2018-06-16 MED ORDER — FUROSEMIDE 20 MG PO TABS: ORAL_TABLET | ORAL | Status: DC

## 2018-06-16 MED ORDER — FUROSEMIDE 20 MG PO TABS: ORAL_TABLET | ORAL | 3 refills | Status: DC

## 2018-06-16 NOTE — Patient Instructions (Signed)
Cardioversion scheduled for Friday, February 21st  - Arrive at the [facility] and go to admitting at 9:30AM  -Do not eat or drink anything after midnight the night prior to your procedure.  - Take all your morning medication with a sip of water prior to arrival.  - You will not be able to drive home after your procedure.

## 2018-06-16 NOTE — Progress Notes (Signed)
Primary Care Physician: Tonia Ghent, MD Primary Cardiologist: Dr [name] Referring Physician: Hospital follow up   Richard Saunders is a 78 y.o. male with a history of persistent atrial fibrillation, HTN, LBBB, aortic insufficiency, and a history of rheumatic fever who presents for follow up in the [facility]. The patient was initially diagnosed with atrial fibrillation on 05/28/18 after presenting to his PCP office with fluid overload and tachycardia. He was sent to the ER where initial ECG showed he was in atrial flutter with HR 121. Repeat ECG showed atrial fibrillation HR 87. He was started on Eliquis and metoprolol. Patient states that he had an URI just prior to onset of afib.   He continues to have SOB and leg edema despite increasing his dieretics. His weight has decreased by about 3 lbs. He remains in rate controlled afib today. CXR today similar to CXR 1/30. Note plans for liver US per PCP. He denies any orthopnea or PND. Recent BNP 81.   Today, he denies symptoms of palpitations, chest pain, orthopnea, PND, dizziness, presyncope, syncope, snoring, daytime somnolence, bleeding, or neurologic sequela. The patient is tolerating medications without difficulties and is otherwise without complaint today.    Atrial Fibrillation Risk Factors:  he does not have symptoms or diagnosis of sleep apnea. he does have a history of rheumatic fever. he does not have a history of alcohol use. he has a BMI of Body mass index is 31.66 kg/m.Marland Kitchen Filed Weights   06/16/18 1349  Weight: 108.9 kg   Family History  Problem Relation Age of Onset  . Cancer Mother        ovarian, uterine  . Aneurysm Mother        AAA in 26  . Cancer Father        Prostate  . COPD Father   . Prostate cancer Father   . Diabetes Other   . Heart disease Other        CAD <60 male  . Hypertension Other   . Heart disease Maternal Grandfather        died from possible "heart attack"  in early 28's  . Heart failure Maternal Uncle   . Colon cancer Neg Hx    The patient does not have a history of early familial atrial fibrillation or other arrhythmias.   Atrial Fibrillation Management history:  Previous antiarrhythmic drugs: none Previous cardioversions: none Previous ablations: none CHADS2VASC score: 3 Anticoagulation history: Eliquis   Past Medical History:  Diagnosis Date  . Allergy   . Anxiety   . Aortic insufficiency    a. 04/2018 Echo: EF 55-60%. Mild to Mod MR. Mild AI/TR.  Marland Kitchen Atrial fibrillation ([place])    a. Dx 04/2018. CHA2DS2VASc = 4-->Eliquis.  . Depression   . H/O: rheumatic fever 2012   (per patient) - eval by [facility] LBBB with PVC's  . History of chicken pox   . Hypertension   . LBBB (left bundle branch block)   . Organic impotence   . PVC's (premature ventricular contractions)    Past Surgical History:  Procedure Laterality Date  . Cautery for nosebleeds    . Right elbow bursa removed    . TONSILLECTOMY AND ADENOIDECTOMY  1947    Current Outpatient Medications  Medication Sig Dispense Refill  . acetaminophen (TYLENOL) 500 MG tablet Take 500 mg by mouth every 6 (six) hours as needed for headache (pain).    Marland Kitchen ALPRAZolam (XANAX) 0.25 MG tablet Take  0.5 tablets (0.125 mg total) by mouth daily as needed for anxiety.    Marland Kitchen apixaban (ELIQUIS) 5 MG TABS tablet Take 1 tablet (5 mg total) by mouth 2 (two) times daily. 180 tablet 3  . cromolyn (OPTICROM) 4 % ophthalmic solution Place 1 drop into both eyes 2 (two) times daily.   2  . furosemide (LASIX) 20 MG tablet Take 2 tablets in the AM and 1 tablet in the PM 30 tablet   . metoprolol succinate (TOPROL-XL) 25 MG 24 hr tablet Take 1.5 tablets (37.5 mg total) by mouth daily. 135 tablet 3  . Polyethyl Glycol-Propyl Glycol (SYSTANE) 0.4-0.3 % SOLN Apply 1 drop to eye as needed. 10 mL 1  . polyethylene glycol powder (GLYCOLAX/MIRALAX) powder Take 17 g by mouth 2 (two) times daily as needed.    Marland Kitchen  spironolactone (ALDACTONE) 25 MG tablet Take 0.5 tablets (12.5 mg total) by mouth daily. 30 tablet 0   No current facility-administered medications for this encounter.     Allergies  Allergen Reactions  . Colchicine Diarrhea  . Fluticasone Propionate Other (See Comments)    He didn't tolerate the taste/smell prev  . Nasacort [Triamcinolone] Other (See Comments)    Didn't tolerate    Social History   Socioeconomic History  . Marital status: Single    Spouse name: Not on file  . Number of children: Not on file  . Years of education: Not on file  . Highest education level: Not on file  Occupational History  . 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 Financial resource strain: Not on file  . Food insecurity:    Worry: Not on file    Inability: Not on file  . Transportation needs:    Medical: Not on file    Non-medical: Not on file  Tobacco Use  . Smoking status: Former Smoker    Years: 40.00    Last attempt to quit: 04/29/1978    Years since quitting: 40.1  . Smokeless tobacco: Never Used  Substance and Sexual Activity  . Alcohol use: Yes    Alcohol/week: 0.0 standard drinks    Comment: Rare  . Drug use: No  . Sexual activity: Not on file  Lifestyle  . Physical activity:    Days per week: Not on file    Minutes per session: Not on file  . Stress: Not on file  Relationships  . Social connections:    Talks on phone: Not on file    Gets together: Not on file    Attends religious service: Not on file    Active member of club or organization: Not on file    Attends meetings of clubs or organizations: Not on file    Relationship status: Not on file  . Intimate partner violence:    Fear of current or ex partner: Not on file    Emotionally abused: Not on file    Physically abused: Not on file    Forced sexual activity: Not on file  Other Topics Concern  . Not on file  Social History Narrative   Education:  [employer]   Regular  Exercise:  No   Enjoys travel / riding motorcycle / [place] are reviewed and negative except as per the HPI above.  Physical Exam: Vitals:   06/16/18 1349  BP: 130/80  Pulse: 73  SpO2: 92%  Weight: 108.9 kg  Height: 6' 1"  (1.854  m)    GEN- The patient is well appearing, obese male, alert and oriented x 3 today.   Head- normocephalic, atraumatic Eyes-  Sclera clear, conjunctiva pink Ears- hearing intact Oropharynx- clear Neck- supple  Lungs- Diminished breath sounds on right side, mildly labored work of breathing Heart- irregular rate and rhythm, no murmurs, rubs or gallops  GI- soft, NT, ND, + BS Extremities- no clubbing, cyanosis, 1+ edema MS- no significant deformity or atrophy Skin- no rash or lesion Psych- euthymic mood, full affect Neuro- strength and sensation are intact  Wt Readings from Last 3 Encounters:  06/16/18 108.9 kg  06/11/18 109 kg  06/05/18 110.2 kg    EKG today demonstrates afib HR 73, LBBB, NST, QRS 134, QTc 456  Echo 05/29/18 demonstrated   1. The left ventricle has normal systolic function of 92-01%. The cavity size is normal. There is no left ventricular wall thickness. Echo evidence of normal diastolic filling patterns.  2. Normal left atrial size.  3. Normal right atrial size.  4. The mitral valve Mildly thickened. Systolic anterior motion anterior mitral leafet. Regurgitation is mild to moderate by color flow Doppler.  5. Normal tricuspid valve.  6. Tricuspid regurgitation is mild.  7. The aortic valve tricuspid. There is mild thickening and mild calcification of the aortic valve. Aortic valve regurgitation is mild by color flow Doppler.  8. No atrial level shunt detected by color flow Doppler.  CXR--Elevated left diaphragm with small moderate left pleural effusion and left basilar and lingular atelectasis or pneumonia.  Epic records are reviewed at length today  Assessment and Plan:  1. Persistent atrial  fibrillation New diagnosis in setting of URI. Patient unaware of arrhythmia but he has had more lower extremity edema. Remains in rate controlled afib today. Continue Toprol to 37.5 mg daily Continue Eliquis 5 mg BID Will arrange DCCV Recent CBC/Bmet at PCP stable.  This patients CHA2DS2-VASc Score and unadjusted Ischemic Stroke Rate (% per year) is equal to 3.2 % stroke rate/year from a score of 3  Above score calculated as 1 point each if present [CHF, HTN, DM, Vascular=MI/PAD/Aortic Plaque, Age if 65-74, or Male] Above score calculated as 2 points each if present [Age > 75, or Stroke/TIA/TE]   2. Dyspnea Echo 55-60% by last echo. Patient continues to have issues with dyspnea on exertion which has steadily gotten worse over several months. Hopefully this will improve with restoration of SR. CXR today showed elevated left diaphragm, small moderate left pleural effusion without significant change from 1/30.  BNP 06/11/18 was 81 If dyspnea does not improve in SR, may need to consider ischemic workup with stress test.  3. Aortic Valve disease Mild by last echo. Followed by Dr [name].  4. Lower extremity edema  Currently on Lasix 40 mg daily and spironolactone 12.5 mg daily. Will add an additional Lasix 20 mg PM dose. Recheck Bmet on f/u after DCCV. See plans under dyspnea.  5. HTN Stable, no changes today.  Follow up one week after DCCV.  [facility] [address] [place],  [address] [phone] 06/16/2018 3:39 PM

## 2018-06-17 ENCOUNTER — Other Ambulatory Visit: Payer: Self-pay | Admitting: Family Medicine

## 2018-06-17 ENCOUNTER — Ambulatory Visit
Admission: RE | Admit: 2018-06-17 | Discharge: 2018-06-17 | Disposition: A | Discharge status: Home / Self Care | Payer: Medicare HMO | Source: Ambulatory Visit | Attending: Family Medicine | Admitting: Family Medicine

## 2018-06-17 ENCOUNTER — Encounter: Payer: Self-pay | Admitting: Family Medicine

## 2018-06-17 DIAGNOSIS — K7460 Unspecified cirrhosis of liver: Secondary | ICD-10-CM | POA: Insufficient documentation

## 2018-06-17 DIAGNOSIS — R140 Abdominal distension (gaseous): Secondary | ICD-10-CM

## 2018-06-18 ENCOUNTER — Encounter (HOSPITAL_COMMUNITY): Payer: Self-pay | Admitting: Emergency Medicine

## 2018-06-18 ENCOUNTER — Telehealth: Payer: Self-pay | Admitting: Family Medicine

## 2018-06-18 ENCOUNTER — Inpatient Hospital Stay (HOSPITAL_COMMUNITY)
Admission: EM | Admit: 2018-06-18 | Discharge: 2018-06-23 | DRG: 432 | Disposition: A | Discharge status: Home / Self Care | Payer: Medicare HMO | Attending: Internal Medicine | Admitting: Internal Medicine

## 2018-06-18 ENCOUNTER — Emergency Department (HOSPITAL_COMMUNITY): Payer: Medicare HMO

## 2018-06-18 ENCOUNTER — Other Ambulatory Visit: Payer: Self-pay

## 2018-06-18 DIAGNOSIS — I4891 Unspecified atrial fibrillation: Secondary | ICD-10-CM

## 2018-06-18 DIAGNOSIS — I4892 Unspecified atrial flutter: Secondary | ICD-10-CM | POA: Diagnosis present

## 2018-06-18 DIAGNOSIS — K7460 Unspecified cirrhosis of liver: Secondary | ICD-10-CM | POA: Diagnosis present

## 2018-06-18 DIAGNOSIS — I099 Rheumatic heart disease, unspecified: Secondary | ICD-10-CM | POA: Diagnosis present

## 2018-06-18 DIAGNOSIS — I471 Supraventricular tachycardia: Secondary | ICD-10-CM | POA: Diagnosis not present

## 2018-06-18 DIAGNOSIS — Z87891 Personal history of nicotine dependence: Secondary | ICD-10-CM | POA: Diagnosis not present

## 2018-06-18 DIAGNOSIS — R0602 Shortness of breath: Secondary | ICD-10-CM

## 2018-06-18 DIAGNOSIS — I10 Essential (primary) hypertension: Secondary | ICD-10-CM

## 2018-06-18 DIAGNOSIS — E669 Obesity, unspecified: Secondary | ICD-10-CM | POA: Diagnosis present

## 2018-06-18 DIAGNOSIS — I110 Hypertensive heart disease with heart failure: Secondary | ICD-10-CM | POA: Diagnosis present

## 2018-06-18 DIAGNOSIS — I4819 Other persistent atrial fibrillation: Secondary | ICD-10-CM | POA: Diagnosis present

## 2018-06-18 DIAGNOSIS — Z888 Allergy status to other drugs, medicaments and biological substances status: Secondary | ICD-10-CM | POA: Diagnosis not present

## 2018-06-18 DIAGNOSIS — I5033 Acute on chronic diastolic (congestive) heart failure: Secondary | ICD-10-CM | POA: Diagnosis present

## 2018-06-18 DIAGNOSIS — I351 Nonrheumatic aortic (valve) insufficiency: Secondary | ICD-10-CM | POA: Diagnosis present

## 2018-06-18 DIAGNOSIS — Z7901 Long term (current) use of anticoagulants: Secondary | ICD-10-CM

## 2018-06-18 DIAGNOSIS — K5730 Diverticulosis of large intestine without perforation or abscess without bleeding: Secondary | ICD-10-CM | POA: Diagnosis present

## 2018-06-18 DIAGNOSIS — E440 Moderate protein-calorie malnutrition: Secondary | ICD-10-CM | POA: Diagnosis present

## 2018-06-18 DIAGNOSIS — F0390 Unspecified dementia without behavioral disturbance: Secondary | ICD-10-CM | POA: Diagnosis present

## 2018-06-18 DIAGNOSIS — R188 Other ascites: Secondary | ICD-10-CM | POA: Diagnosis present

## 2018-06-18 DIAGNOSIS — K5909 Other constipation: Secondary | ICD-10-CM | POA: Diagnosis present

## 2018-06-18 DIAGNOSIS — I447 Left bundle-branch block, unspecified: Secondary | ICD-10-CM | POA: Diagnosis present

## 2018-06-18 DIAGNOSIS — Z6828 Body mass index (BMI) 28.0-28.9, adult: Secondary | ICD-10-CM | POA: Diagnosis not present

## 2018-06-18 DIAGNOSIS — Z825 Family history of asthma and other chronic lower respiratory diseases: Secondary | ICD-10-CM | POA: Diagnosis not present

## 2018-06-18 DIAGNOSIS — K7581 Nonalcoholic steatohepatitis (NASH): Secondary | ICD-10-CM | POA: Diagnosis present

## 2018-06-18 DIAGNOSIS — Z8042 Family history of malignant neoplasm of prostate: Secondary | ICD-10-CM | POA: Diagnosis not present

## 2018-06-18 DIAGNOSIS — J90 Pleural effusion, not elsewhere classified: Secondary | ICD-10-CM

## 2018-06-18 DIAGNOSIS — Z8249 Family history of ischemic heart disease and other diseases of the circulatory system: Secondary | ICD-10-CM | POA: Diagnosis not present

## 2018-06-18 LAB — I-STAT TROPONIN, ED: Troponin i, poc: 0.01 ng/mL (ref 0.00–0.08)

## 2018-06-18 LAB — HEPATIC FUNCTION PANEL
ALT: 22 U/L (ref 0–44)
AST: 42 U/L — AB (ref 15–41)
Albumin: 3.3 g/dL — ABNORMAL LOW (ref 3.5–5.0)
Alkaline Phosphatase: 47 U/L (ref 38–126)
BILIRUBIN DIRECT: 0.7 mg/dL — AB (ref 0.0–0.2)
Indirect Bilirubin: 2.3 mg/dL — ABNORMAL HIGH (ref 0.3–0.9)
Total Bilirubin: 3 mg/dL — ABNORMAL HIGH (ref 0.3–1.2)
Total Protein: 6.8 g/dL (ref 6.5–8.1)

## 2018-06-18 LAB — CBC
HCT: 43.3 % (ref 39.0–52.0)
Hemoglobin: 13.8 g/dL (ref 13.0–17.0)
MCH: 33.9 pg (ref 26.0–34.0)
MCHC: 31.9 g/dL (ref 30.0–36.0)
MCV: 106.4 fL — ABNORMAL HIGH (ref 80.0–100.0)
Platelets: 155 10*3/uL (ref 150–400)
RBC: 4.07 MIL/uL — ABNORMAL LOW (ref 4.22–5.81)
RDW: 14.6 % (ref 11.5–15.5)
WBC: 6.1 10*3/uL (ref 4.0–10.5)
nRBC: 0 % (ref 0.0–0.2)

## 2018-06-18 LAB — BASIC METABOLIC PANEL
Anion gap: 10 (ref 5–15)
BUN: 27 mg/dL — ABNORMAL HIGH (ref 8–23)
CO2: 32 mmol/L (ref 22–32)
Calcium: 9.2 mg/dL (ref 8.9–10.3)
Chloride: 100 mmol/L (ref 98–111)
Creatinine, Ser: 1.11 mg/dL (ref 0.61–1.24)
GFR calc Af Amer: >60 mL/min (ref >60)
GFR calc non Af Amer: >60 mL/min (ref >60)
Glucose, Bld: 111 mg/dL — ABNORMAL HIGH (ref 70–99)
Potassium: 4.4 mmol/L (ref 3.5–5.1)
SODIUM: 142 mmol/L (ref 135–145)

## 2018-06-18 LAB — PROTIME-INR
INR: 2.07
Prothrombin Time: 23 seconds — ABNORMAL HIGH (ref 11.4–15.2)

## 2018-06-18 LAB — AMMONIA: Ammonia: 25 umol/L (ref 9–35)

## 2018-06-18 MED ORDER — SPIRONOLACTONE 25 MG PO TABS
25.0000 mg | ORAL_TABLET | Freq: Two times a day (BID) | ORAL | Status: DC
  Administered 2018-06-18 – 2018-06-20 (×5): 25 mg via ORAL
  Filled 2018-06-18 (×5): qty 1

## 2018-06-18 MED ORDER — SODIUM CHLORIDE 0.9% FLUSH
3.0000 mL | Freq: Once | INTRAVENOUS | Status: AC
  Administered 2018-06-18: 3 mL via INTRAVENOUS

## 2018-06-18 MED ORDER — APIXABAN 5 MG PO TABS
5.0000 mg | ORAL_TABLET | Freq: Two times a day (BID) | ORAL | Status: DC
  Administered 2018-06-18 – 2018-06-23 (×10): 5 mg via ORAL
  Filled 2018-06-18 (×10): qty 1

## 2018-06-18 MED ORDER — METOPROLOL SUCCINATE ER 25 MG PO TB24
37.5000 mg | ORAL_TABLET | Freq: Every day | ORAL | Status: DC
  Administered 2018-06-19 – 2018-06-21 (×3): 37.5 mg via ORAL
  Filled 2018-06-18 (×3): qty 2

## 2018-06-18 MED ORDER — ALBUMIN HUMAN 25 % IV SOLN
25.0000 g | Freq: Once | INTRAVENOUS | Status: DC | PRN
  Filled 2018-06-18: qty 100

## 2018-06-18 MED ORDER — CROMOLYN SODIUM 4 % OP SOLN
2.0000 [drp] | OPHTHALMIC | Status: DC
  Administered 2018-06-19 – 2018-06-23 (×5): 2 [drp] via OPHTHALMIC
  Filled 2018-06-18: qty 10

## 2018-06-18 MED ORDER — FUROSEMIDE 10 MG/ML IJ SOLN
80.0000 mg | Freq: Once | INTRAMUSCULAR | Status: AC
  Administered 2018-06-18: 80 mg via INTRAVENOUS
  Filled 2018-06-18: qty 8

## 2018-06-18 NOTE — ED Provider Notes (Signed)
[place] EMERGENCY DEPARTMENT Provider Note   CSN: [id number] Arrival date & time: 06/18/18  1151    History   Chief Complaint Chief Complaint  Patient presents with  . Shortness of Breath    HPI Richard Saunders is a 78 y.o. male.     HPI   Got up this morning, cleared throat and there was as little bit of blood, scheduled for cardioversion Reports feeling like nothing is changing despite diuretics Feels short of breath at rest a lot of the time, walks from chair to bathroom has to stop and wait, feels short of breath just bending over to pick something up Since has been home has been taking 2 a day, then 3 a day of lasix Yesterday was just increased to 2 in AM and one at night but did not take the one at night because thought would be coming here. Started spironolactone Today didn't take any, did take blood thinner and metoprolol  Discharged 2/2 after admission for A fib  Reports abdominal distention for about one month Swelling in ankles 1/30, hasn't decreased at all, dyspnea and fatigue worsening.  Orthopnea.  No chest pain  Has not missed any doses of anticoagulation  No abdominal pain, nausea, vomiting, fever Chronic constipation Over the last year progressive dyspnea.     Past Medical History:  Diagnosis Date  . Allergy   . Anxiety   . Aortic insufficiency    a. 04/2018 Echo: EF 55-60%. Mild to Mod MR. Mild AI/TR.  Marland Kitchen Atrial fibrillation ([place])    a. Dx 04/2018. CHA2DS2VASc = 4-->Eliquis.  . Depression   . H/O: rheumatic fever 2012   (per patient) - eval by [facility] LBBB with PVC's  . History of chicken pox   . Hypertension   . LBBB (left bundle branch block)   . Organic impotence   . PVC's (premature ventricular contractions)     Patient Active Problem List   Diagnosis Date Noted  . Acute on chronic diastolic CHF (congestive heart failure) ([place]) 06/18/2018  . Hepatic cirrhosis ([place]) 06/17/2018  . Abdominal bloating  06/14/2018  . Tachycardia 05/28/2018  . Atrial fibrillation with RVR ([place]) 05/28/2018  . Dupuytren contracture 05/03/2018  . Neck pain 01/27/2018  . Bell's palsy 11/29/2017  . Healthcare maintenance 10/28/2017  . Advance care planning 10/28/2017  . Left ventricular hypertrophy 06/10/2013  . Gout 08/04/2012  . Cough 09/26/2011  . Left bundle branch block 06/23/2010  . ANXIETY DEPRESSION 06/07/2010  . RHEUMATIC FEVER 06/07/2010  . Premature ventricular contractions 06/07/2010  . ALLERGIC RHINITIS 06/07/2010  . 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 Aortic regurgitation 06/07/2010  . CHICKENPOX, HX OF 06/07/2010    Past Surgical History:  Procedure Laterality Date  . Cautery for nosebleeds    . Right elbow bursa removed    . TONSILLECTOMY AND ADENOIDECTOMY  1947        Home Medications    Prior to Admission medications   Medication Sig Start Date End Date Taking? Authorizing Provider  acetaminophen (TYLENOL) 500 MG tablet Take 500 mg by mouth every 6 (six) hours as needed for headache (pain).   Yes [provider]  ALPRAZolam (XANAX) 0.25 MG tablet Take 0.5 tablets (0.125 mg total) by mouth daily as needed for anxiety. 01/27/18  Yes Tonia Ghent, MD  apixaban (ELIQUIS) 5 MG TABS tablet Take 1 tablet (5 mg total) by mouth 2 (two) times daily. 06/02/18 06-Jun-2019 Yes Fenton, Clint R, PA  cromolyn (OPTICROM)  4 % ophthalmic solution Place 2 drops into both eyes every morning.  05/31/15  Yes [provider]  furosemide (LASIX) 20 MG tablet Take 2 tablets in the AM and 1 tablet in the PM Patient taking differently: Take 40 mg by mouth See admin instructions. Take 40 mg  tablets in the AM and 20 mg  tablet in the PM 06/16/18  Yes Fenton, Clint R, PA  metoprolol succinate (TOPROL-XL) 25 MG 24 hr tablet Take 1.5 tablets (37.5 mg total) by mouth daily. 06/02/18 06/06/19 Yes Fenton, Clint R, PA  polyethylene glycol powder (GLYCOLAX/MIRALAX) powder Take 17 g by mouth 2 (two) times daily  as needed. 01/27/18  Yes Tonia Ghent, MD  spironolactone (ALDACTONE) 25 MG tablet Take 0.5 tablets (12.5 mg total) by mouth daily. 06/12/18  Yes Tonia Ghent, MD  Polyethyl Glycol-Propyl Glycol (SYSTANE) 0.4-0.3 % SOLN Apply 1 drop to eye as needed. Patient not taking: Reported on 06/18/2018 11/28/17   Tonia Ghent, MD    Family History Family History  Problem Relation Age of Onset  . Cancer Mother        ovarian, uterine  . Aneurysm Mother        AAA in 5  . Cancer Father        Prostate  . COPD Father   . Prostate cancer Father   . Diabetes Other   . Heart disease Other        CAD <60 male  . Hypertension Other   . Heart disease Maternal Grandfather        died from possible "heart attack" in early 10's  . Heart failure Maternal Uncle   . Colon cancer Neg Hx     Social History Social History   Tobacco Use  . Smoking status: Former Smoker    Years: 40.00    Last attempt to quit: 04/29/1978    Years since quitting: 40.1  . Smokeless tobacco: Never Used  Substance Use Topics  . Alcohol use: Yes    Alcohol/week: 0.0 standard drinks    Comment: Rare  . Drug use: No     Allergies   Colchicine; Fluticasone propionate; and Nasacort [triamcinolone]   Review of Systems Review of Systems  Constitutional: Positive for fatigue. Negative for fever.  HENT: Positive for postnasal drip. Negative for sore throat.   Eyes: Negative for visual disturbance.  Respiratory: Positive for cough and shortness of breath.   Cardiovascular: Positive for leg swelling. Negative for chest pain.  Gastrointestinal: Negative for abdominal pain, nausea and vomiting.  Genitourinary: Negative for difficulty urinating.  Musculoskeletal: Negative for back pain and neck stiffness.  Skin: Negative for rash.  Neurological: Positive for light-headedness. Negative for syncope and headaches.     Physical Exam Updated Vital Signs BP (!) 114/53   Pulse (!) 50   Temp 98.3 F (36.8 C)  (Oral)   Resp (!) 22   SpO2 93%   Physical Exam Vitals signs and nursing note reviewed.  Constitutional:      General: He is not in acute distress.    Appearance: He is well-developed. He is not diaphoretic.  HENT:     Head: Normocephalic and atraumatic.  Eyes:     Conjunctiva/sclera: Conjunctivae normal.  Neck:     Musculoskeletal: Normal range of motion.     Vascular: JVD present.  Cardiovascular:     Rate and Rhythm: Normal rate. Rhythm irregular.     Heart sounds: Normal heart sounds. No murmur. No  friction rub. No gallop.   Pulmonary:     Effort: Pulmonary effort is normal. No respiratory distress.     Breath sounds: Examination of the left-lower field reveals decreased breath sounds. Decreased breath sounds present. No wheezing or rales.  Abdominal:     General: There is no distension.     Palpations: Abdomen is soft.     Tenderness: There is no abdominal tenderness. There is no guarding.  Musculoskeletal:     Right lower leg: Edema present.     Left lower leg: Edema present.  Skin:    General: Skin is warm and dry.  Neurological:     Mental Status: He is alert and oriented to person, place, and time.      ED Treatments / Results  Labs (all labs ordered are listed, but only abnormal results are displayed) Labs Reviewed  BASIC METABOLIC PANEL - Abnormal; Notable for the following components:      Result Value   Glucose, Bld 111 (*)    BUN 27 (*)    All other components within normal limits  CBC - Abnormal; Notable for the following components:   RBC 4.07 (*)    MCV 106.4 (*)    All other components within normal limits  HEPATIC FUNCTION PANEL - Abnormal; Notable for the following components:   Albumin 3.3 (*)    AST 42 (*)    Total Bilirubin 3.0 (*)    Bilirubin, Direct 0.7 (*)    Indirect Bilirubin 2.3 (*)    All other components within normal limits  PROTIME-INR - Abnormal; Notable for the following components:   Prothrombin Time 23.0 (*)    All other  components within normal limits  I-STAT TROPONIN, ED    EKG EKG Interpretation  Date/Time:  Thursday June 18 2018 12:07:54 EST Ventricular Rate:  61 PR Interval:    QRS Duration: 84 QT Interval:  416 QTC Calculation: 418 R Axis:   -29 Text Interpretation:  Sinus rhythm with Premature supraventricular complexes with ventricular escape complexes Minimal voltage criteria for LVH, may be normal variant Nonspecific T wave abnormality Abnormal ECG No significant change since last tracing Confirmed by Gareth Morgan [phone]) on 06/18/2018 3:11:18 PM   Radiology Dg Chest 2 View  Result Date: 06/18/2018 CLINICAL DATA:  Shortness of breath, hemoptysis. EXAM: CHEST - 2 VIEW COMPARISON:  Radiographs of June 16, 2018. FINDINGS: Stable cardiomediastinal silhouette. Moderate left pleural effusion is noted with probable underlying atelectasis or infiltrate. No pneumothorax is noted. Right lung is clear. Bony thorax is unremarkable. IMPRESSION: Moderate left pleural effusion with probable underlying atelectasis or infiltrate. Electronically Signed   By: Marijo Conception, M.D.   On: 06/18/2018 12:37   US Abdomen Complete  Result Date: 06/17/2018 CLINICAL DATA:  Abdominal bloating. EXAM: ABDOMEN ULTRASOUND COMPLETE COMPARISON:  None. FINDINGS: Gallbladder: No gallstones or wall thickening visualized. No sonographic Murphy sign noted by sonographer. Common bile duct: Diameter: 3 mm which is within normal limits. Liver: Heterogeneous echotexture is noted with nodular hepatic contours consistent with hepatic cirrhosis. No focal abnormality is noted sonographically. Portal vein is patent on color Doppler imaging with normal direction of blood flow towards the liver. IVC: Not well visualized due to overlying bowel gas and body habitus. Pancreas: Not well visualized due to overlying bowel gas and body habitus. Spleen: Maximum measured diameter of 13 cm with calculated volume of 683 cubic cm consistent with  mild splenomegaly. Right Kidney: Length: 9.9 cm. Echogenicity within normal limits. No mass  or hydronephrosis visualized. Left Kidney: Length: 10.8 cm. Echogenicity within normal limits. No mass or hydronephrosis visualized. Abdominal aorta: Not visualized due to overlying bowel gas and body habitus. Other findings: Mild ascites is noted. 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 No focal hepatic abnormality is seen sonographically. Electronically Signed   By: Marijo Conception, M.D.   On: 06/17/2018 15:32    Procedures Procedures (including critical care time)  Medications Ordered in ED Medications  sodium chloride flush (NS) 0.9 % injection 3 mL (has no administration in time range)     Initial Impression / Assessment and Plan / ED Course  I have reviewed the triage vital signs and the nursing notes.  Pertinent labs & imaging results that were available during my care of the patient were reviewed by me and considered in my medical decision making (see chart for details).        78 year old male with a history of atrial fibrillation diagnosed in January 2020, on Eliquis, with history of aortic insufficiency, rheumatic fever, hypertension, chronic diastolic CHF, recent diagnosis of cirrhosis yesterday on outpatient abdominal ultrasound with ascites, who presents with concern for increasing fatigue and dyspnea.  In addition, patient reports a brief episode of very small amount of hemoptysis.  This is in the setting of patient reporting dry throat, now on anticoagulation.  There is no acute worsening of his shortness of breath, no chest pain, and have low suspicion for pulmonary embolus at this point.  History and exam concerning for likely multifactorial etiology of dyspnea and fatigue.  Symptoms may represent symptomatic atrial fibrillation, however also are likely secondary to CHF, and hypervolemia related to ascites.  Given patient with symptoms at rest and very minimal  exertion, will admit for continued evaluation.  His blood pressures at this time are low 257D systolic and 90 systolic, making aggressive diuresis difficult.  Consulted cardiology, who will evaluate the patient, and consulted hospitalist for admission.   Final Clinical Impressions(s) / ED Diagnoses   Final diagnoses:  Shortness of breath  Pleural effusion  Cirrhosis of liver with ascites, unspecified hepatic cirrhosis type [facility])  Atrial fibrillation, unspecified type [facility])    ED Discharge Orders    None       Gareth Morgan, MD 06/19/18 0101

## 2018-06-18 NOTE — ED Notes (Signed)
ED TO INPATIENT HANDOFF REPORT  ED Nurse Name and Phone #:  Jenny Reichmann RN [phone]  S Name/Age/Gender [name] 78 y.o. male Room/Bed: 021C/021C  Code Status   Code Status: Prior  Home/SNF/Other Home Patient oriented to: self, place, time and situation Is this baseline? Yes   Triage Complete: Triage complete  Chief Complaint coughing up  blood   Triage Note Pt to ER for evaluation of persistent weakness and shortness of breath since recent hospitalization for a fib. Increased swelling to bilateral extremities and abdomen.    Allergies Allergies  Allergen Reactions  . Colchicine Diarrhea  . Fluticasone Propionate Other (See Comments)    He didn't tolerate the taste/smell prev  . Nasacort [Triamcinolone] Other (See Comments)    Didn't tolerate    Level of Care/Admitting Diagnosis ED Disposition    ED Disposition Condition Comment   Admit  Hospital Area: [place] [[id number]]  Level of Care: Cardiac Telemetry [103]  Diagnosis: Acute on chronic diastolic CHF (congestive heart failure) [facility]) [[id number]]  Admitting Physician: Manfred Shirts  Attending Physician: Waldron Labs, DAWOOD S [[id number]]  Estimated length of stay: 3 - 4 days  Certification:: I certify this patient will need inpatient services for at least 2 midnights  PT Class (Do Not Modify): Inpatient [101]  PT Acc Code (Do Not Modify): Private [1]       B Medical/Surgery History Past Medical History:  Diagnosis Date  . Allergy   . Anxiety   . Aortic insufficiency    a. 04/2018 Echo: EF 55-60%. Mild to Mod MR. Mild AI/TR.  Marland Kitchen Atrial fibrillation ([place])    a. Dx 04/2018. CHA2DS2VASc = 4-->Eliquis.  . Depression   . H/O: rheumatic fever 2012   (per patient) - eval by [facility] LBBB with PVC's  . History of chicken pox   . Hypertension   . LBBB (left bundle branch block)   . Organic impotence   . PVC's (premature ventricular contractions)    Past Surgical History:  Procedure  Laterality Date  . Cautery for nosebleeds    . Right elbow bursa removed    . TONSILLECTOMY AND ADENOIDECTOMY  1947     A IV Location/Drains/Wounds Patient Lines/Drains/Airways Status   Active Line/Drains/Airways    None          Intake/Output Last 24 hours No intake or output data in the 24 hours ending 06/18/18 1700  Labs/Imaging Results for orders placed or performed during the hospital encounter of 06/18/18 (from the past 48 hour(s))  I-stat troponin, ED     Status: None   Collection Time: 06/18/18 12:07 PM  Result Value Ref Range   Troponin i, poc 0.01 0.00 - 0.08 ng/mL   Comment 3            Comment: Due to the release kinetics of cTnI, a negative result within the first hours of the onset of symptoms does not rule out myocardial infarction with certainty. If myocardial infarction is still suspected, repeat the test at appropriate intervals.   Basic metabolic panel     Status: Abnormal   Collection Time: 06/18/18 12:11 PM  Result Value Ref Range   Sodium 142 135 - 145 mmol/L   Potassium 4.4 3.5 - 5.1 mmol/L   Chloride 100 98 - 111 mmol/L   CO2 32 22 - 32 mmol/L   Glucose, Bld 111 (H) 70 - 99 mg/dL   BUN 27 (H) 8 - 23 mg/dL   Creatinine, Ser  1.11 0.61 - 1.24 mg/dL   Calcium 9.2 8.9 - 10.3 mg/dL   GFR calc non Af Amer >60 >60 mL/min   GFR calc Af Amer >60 >60 mL/min   Anion gap 10 5 - 15    Comment: Performed at [facility] [address] [place], [place] [address]  CBC     Status: Abnormal   Collection Time: 06/18/18 12:11 PM  Result Value Ref Range   WBC 6.1 4.0 - 10.5 K/uL   RBC 4.07 (L) 4.22 - 5.81 MIL/uL   Hemoglobin 13.8 13.0 - 17.0 g/dL   HCT 43.3 39.0 - 52.0 %   MCV 106.4 (H) 80.0 - 100.0 fL   MCH 33.9 26.0 - 34.0 pg   MCHC 31.9 30.0 - 36.0 g/dL   RDW 14.6 11.5 - 15.5 %   Platelets 155 150 - 400 K/uL   nRBC 0.0 0.0 - 0.2 %    Comment: Performed at [facility], [place] [address], [place], [place] [address]  Hepatic function panel      Status: Abnormal   Collection Time: 06/18/18 12:11 PM  Result Value Ref Range   Total Protein 6.8 6.5 - 8.1 g/dL   Albumin 3.3 (L) 3.5 - 5.0 g/dL   AST 42 (H) 15 - 41 U/L   ALT 22 0 - 44 U/L   Alkaline Phosphatase 47 38 - 126 U/L   Total Bilirubin 3.0 (H) 0.3 - 1.2 mg/dL   Bilirubin, Direct 0.7 (H) 0.0 - 0.2 mg/dL   Indirect Bilirubin 2.3 (H) 0.3 - 0.9 mg/dL    Comment: Performed at [facility] [address], [place], [place] [address]  Protime-INR     Status: Abnormal   Collection Time: 06/18/18 12:11 PM  Result Value Ref Range   Prothrombin Time 23.0 (H) 11.4 - 15.2 seconds   INR 2.07     Comment: Performed at [facility] [address] [place], [place] [address]   Dg Chest 2 View  Result Date: 06/18/2018 CLINICAL DATA:  Shortness of breath, hemoptysis. EXAM: CHEST - 2 VIEW COMPARISON:  Radiographs of June 16, 2018. FINDINGS: Stable cardiomediastinal silhouette. Moderate left pleural effusion is noted with probable underlying atelectasis or infiltrate. No pneumothorax is noted. Right lung is clear. Bony thorax is unremarkable. IMPRESSION: Moderate left pleural effusion with probable underlying atelectasis or infiltrate. Electronically Signed   By: Marijo Conception, M.D.   On: 06/18/2018 12:37   US Abdomen Complete  Result Date: 06/17/2018 CLINICAL DATA:  Abdominal bloating. EXAM: ABDOMEN ULTRASOUND COMPLETE COMPARISON:  None. FINDINGS: Gallbladder: No gallstones or wall thickening visualized. No sonographic Murphy sign noted by sonographer. Common bile duct: Diameter: 3 mm which is within normal limits. Liver: Heterogeneous echotexture is noted with nodular hepatic contours consistent with hepatic cirrhosis. No focal abnormality is noted sonographically. Portal vein is patent on color Doppler imaging with normal direction of blood flow towards the liver. IVC: Not well visualized due to overlying bowel gas and body habitus. Pancreas: Not well visualized due to  overlying bowel gas and body habitus. Spleen: Maximum measured diameter of 13 cm with calculated volume of 683 cubic cm consistent with mild splenomegaly. Right Kidney: Length: 9.9 cm. Echogenicity within normal limits. No mass or hydronephrosis visualized. Left Kidney: Length: 10.8 cm. Echogenicity within normal limits. No mass or hydronephrosis visualized. Abdominal aorta: Not visualized due to overlying bowel gas and body habitus. Other findings: Mild ascites is noted. IMPRESSION: Hepatic cirrhosis  with mild splenomegaly and ascites. No focal hepatic abnormality is seen sonographically. Electronically Signed   By: Marijo Conception, M.D.   On: 06/17/2018 15:32    Pending Labs [employer] (From admission, onward)    Start     Ordered   Signed and Occupational hygienist morning,   R     Signed and Held   Signed and Held  CBC  Tomorrow morning,   R     Signed and Held   Signed and Held  Basic metabolic panel  Daily,   R     Signed and Held          Vitals/Pain Today's Vitals   06/18/18 1545 06/18/18 1615 06/18/18 1630 06/18/18 1645  BP: 115/67 (!) 114/53 123/75 (!) 102/58  Pulse: 74 (!) 50 (!) 52 62  Resp: 18 (!) 22 20 15   Temp:      TempSrc:      SpO2: 96% 93% 92% 94%  PainSc:        Isolation Precautions No active isolations  Medications Medications  sodium chloride flush (NS) 0.9 % injection 3 mL (has no administration in time range)    Mobility walks Low fall risk   Focused Assessments Cardiac Assessment Handoff:  Cardiac Rhythm: Normal sinus rhythm No results found for: CKTOTAL, CKMB, CKMBINDEX, TROPONINI No results found for: DDIMER Does the Patient currently have chest pain? No      R Recommendations: See Admitting Provider Note  Report given to:   Additional Notes:

## 2018-06-18 NOTE — Telephone Encounter (Signed)
He did the right thing by going to the ER.  I'll await the ER notes and then we'll go from there.  Thanks.

## 2018-06-18 NOTE — ED Triage Notes (Signed)
Pt to ER for evaluation of persistent weakness and shortness of breath since recent hospitalization for a fib. Increased swelling to bilateral extremities and abdomen.

## 2018-06-18 NOTE — Telephone Encounter (Signed)
I spoke to the patient about the lab results.  Patient states he coughed up blood this morning so he went to the ER to be evaluated.  He is waiting there to see a doctor.  Patient says they drew blood but he doesn't know if that included the Hepatitis panel that you want.

## 2018-06-18 NOTE — Telephone Encounter (Signed)
Pt is requesting a call in regards to the lab results he got back yesterday. Pt also wants to let Dr.Duncan know he is currently in the ER at [facility] getting ready to be evaluated.

## 2018-06-18 NOTE — ED Notes (Addendum)
Attempted report x1. 

## 2018-06-18 NOTE — H&P (Signed)
TRH H&P   Patient Demographics:    Richard Saunders, is a 78 y.o. male  MRN: [id number]   DOB - [birth year]  Admit Date - 06/18/2018  Outpatient Primary MD for the patient is Tonia Ghent, MD  Referring MD/NP/PA: Dr Billy Fischer  Patient coming from: home  Chief Complaint  Patient presents with  . Shortness of Breath      HPI:    Richard Saunders  is a 78 y.o. male, with past medical history of atrial fibrillation, hypertension, fever, diagnosis of liver cirrhosis, patient presents with complaints of dyspnea, progressive, mainly exertional, currently at rest, reports worsening lower extremity edema, at baseline +1, currently +2, will increase abdominal girth and swelling, was seen by cardiology as an outpatient, who felt his A. fib contributing to symptoms, plan for outpatient cardioversion tomorrow, denies any chest pain, fever, chills, cough, dysuria or polyuria.  Patient had right upper quadrant ultrasound done by his PCP yesterday, significant for cirrhosis with some ascites, patient denies any history of alcohol or substance abuse. - in ED no function at baseline at 1.1, platelet at baseline 155, chest x-ray for moderate left pleural effusion with underlying atelectasis, I was called to admit    Review of systems:    In addition to the HPI above,  No Fever-chills, reports generalized weakness and fatigue No Headache, No changes with Vision or hearing, No problems swallowing food or Liquids, No Chest pain, Cough reports of progressive dyspnea and lower extremity edema No Abdominal pain, No Nausea or Vommitting, Bowel movements are regular, No Blood in stool or Urine, No dysuria, No new skin rashes or bruises, No new joints pains-aches,  No new weakness, tingling, numbness in any extremity, No recent weight gain or loss, No polyuria, polydypsia or polyphagia, No  significant Mental Stressors.  A full 10 point Review of Systems was done, except as stated above, all other Review of Systems were negative.   With Past History of the following :    Past Medical History:  Diagnosis Date  . Allergy   . Anxiety   . Aortic insufficiency    a. 04/2018 Echo: EF 55-60%. Mild to Mod MR. Mild AI/TR.  Marland Kitchen Atrial fibrillation ([place])    a. Dx 04/2018. CHA2DS2VASc = 4-->Eliquis.  . Depression   . H/O: rheumatic fever 2012   (per patient) - eval by [facility] LBBB with PVC's  . History of chicken pox   . Hypertension   . LBBB (left bundle branch block)   . Organic impotence   . PVC's (premature ventricular contractions)       Past Surgical History:  Procedure Laterality Date  . Cautery for nosebleeds    . Right elbow bursa removed    . TONSILLECTOMY AND ADENOIDECTOMY  1947      Social History:     Social History   Tobacco Use  . Smoking status:  Former Smoker    Years: 40.00    Last attempt to quit: 04/29/1978    Years since quitting: 40.1  . Smokeless tobacco: Never Used  Substance Use Topics  . Alcohol use: Yes    Alcohol/week: 0.0 standard drinks    Comment: Rare     Lives -at home  Mobility -independent     Family History :     Family History  Problem Relation Age of Onset  . Cancer Mother        ovarian, uterine  . Aneurysm Mother        AAA in 97  . Cancer Father        Prostate  . COPD Father   . Prostate cancer Father   . Diabetes Other   . Heart disease Other        CAD <60 male  . Hypertension Other   . Heart disease Maternal Grandfather        died from possible "heart attack" in early 23's  . Heart failure Maternal Uncle   . Colon cancer Neg Hx       Home Medications:   Prior to Admission medications   Medication Sig Start Date End Date Taking? Authorizing Provider  acetaminophen (TYLENOL) 500 MG tablet Take 500 mg by mouth every 6 (six) hours as needed for headache (pain).   Yes [provider]  ALPRAZolam (XANAX) 0.25 MG tablet Take 0.5 tablets (0.125 mg total) by mouth daily as needed for anxiety. 01/27/18  Yes Tonia Ghent, MD  apixaban (ELIQUIS) 5 MG TABS tablet Take 1 tablet (5 mg total) by mouth 2 (two) times daily. 06/02/18 June 16, 2019 Yes Fenton, Clint R, PA  cromolyn (OPTICROM) 4 % ophthalmic solution Place 2 drops into both eyes every morning.  05/31/15  Yes [provider]  furosemide (LASIX) 20 MG tablet Take 2 tablets in the AM and 1 tablet in the PM Patient taking differently: Take 40 mg by mouth See admin instructions. Take 40 mg  tablets in the AM and 20 mg  tablet in the PM 06/16/18  Yes Fenton, Clint R, PA  metoprolol succinate (TOPROL-XL) 25 MG 24 hr tablet Take 1.5 tablets (37.5 mg total) by mouth daily. 06/02/18 06/16/19 Yes Fenton, Clint R, PA  polyethylene glycol powder (GLYCOLAX/MIRALAX) powder Take 17 g by mouth 2 (two) times daily as needed. 01/27/18  Yes Tonia Ghent, MD  spironolactone (ALDACTONE) 25 MG tablet Take 0.5 tablets (12.5 mg total) by mouth daily. 06/12/18  Yes Tonia Ghent, MD  Polyethyl Glycol-Propyl Glycol (SYSTANE) 0.4-0.3 % SOLN Apply 1 drop to eye as needed. Patient not taking: Reported on 06/18/2018 11/28/17   Tonia Ghent, MD     Allergies:     Allergies  Allergen Reactions  . Colchicine Diarrhea  . Fluticasone Propionate Other (See Comments)    He didn't tolerate the taste/smell prev  . Nasacort [Triamcinolone] Other (See Comments)    Didn't tolerate     Physical Exam:   Vitals  Blood pressure 136/73, pulse (!) 51, temperature 97.6 F (36.4 C), temperature source Oral, resp. rate 18, SpO2 96 %.   1. General elderly male laying in bed in no apparent distress  2. Normal affect and insight, Not Suicidal or Homicidal, Awake Alert, Oriented X 3.  3. No F.N deficits, ALL C.Nerves Intact, Strength 5/5 all 4 extremities, Sensation intact all 4 extremities, Plantars down going.  4. Ears and Eyes appear Normal,  Conjunctivae clear, PERRLA. Moist  Oral Mucosa.  5. Supple Neck, No JVD, No cervical lymphadenopathy appriciated, No Carotid Bruits.  6. Symmetrical Chest wall movement, Good air movement bilaterally, Minister entry at the bases  7.  irregular irregular, No Gallops, Rubs or Murmurs, No Parasternal Heave.  8. Positive Bowel Sounds, increased abdominal wall edema, with mild ascitic wave, nontender to palpation, no rebound, no guarding,   9.  No Cyanosis, Normal Skin Turgor, No Skin Rash or Bruise.  10. Good muscle tone,  joints appear normal , no effusions, Normal ROM.  +2 edema bilaterally  11. No Palpable Lymph Nodes in Neck or Axillae     Data Review:    CBC Recent Labs  Lab 06/18/18 1211  WBC 6.1  HGB 13.8  HCT 43.3  PLT 155  MCV 106.4*  MCH 33.9  MCHC 31.9  RDW 14.6   ------------------------------------------------------------------------------------------------------------------  Chemistries  Recent Labs  Lab 06/18/18 1211  NA 142  K 4.4  CL 100  CO2 32  GLUCOSE 111*  BUN 27*  CREATININE 1.11  CALCIUM 9.2  AST 42*  ALT 22  ALKPHOS 47  BILITOT 3.0*   ------------------------------------------------------------------------------------------------------------------ estimated creatinine clearance is 72.1 mL/min (by C-G formula based on SCr of 1.11 mg/dL). ------------------------------------------------------------------------------------------------------------------ No results for input(s): TSH, T4TOTAL, T3FREE, THYROIDAB in the last 72 hours.  Invalid input(s): FREET3  Coagulation profile Recent Labs  Lab 06/18/18 1211  INR 2.07   ------------------------------------------------------------------------------------------------------------------- No results for input(s): DDIMER in the last 72 hours. -------------------------------------------------------------------------------------------------------------------  Cardiac Enzymes No results for  input(s): CKMB, TROPONINI, MYOGLOBIN in the last 168 hours.  Invalid input(s): CK ------------------------------------------------------------------------------------------------------------------    Component Value Date/Time   BNP 73.5 05/28/2018 1115     ---------------------------------------------------------------------------------------------------------------  Urinalysis    Component Value Date/Time   LABSPEC 1.020 07/25/2011 1403   PHURINE 6.0 07/25/2011 1403   GLUCOSEU NEGATIVE 07/25/2011 1403   HGBUR NEGATIVE 07/25/2011 1403   BILIRUBINUR NEGATIVE 07/25/2011 1403   KETONESUR NEGATIVE 07/25/2011 1403   PROTEINUR 30 (A) 07/25/2011 1403   UROBILINOGEN 1.0 07/25/2011 1403   NITRITE NEGATIVE 07/25/2011 1403   LEUKOCYTESUR NEGATIVE 07/25/2011 1403    ----------------------------------------------------------------------------------------------------------------   Imaging Results:    Dg Chest 2 View  Result Date: 06/18/2018 CLINICAL DATA:  Shortness of breath, hemoptysis. EXAM: CHEST - 2 VIEW COMPARISON:  Radiographs of June 16, 2018. FINDINGS: Stable cardiomediastinal silhouette. Moderate left pleural effusion is noted with probable underlying atelectasis or infiltrate. No pneumothorax is noted. Right lung is clear. Bony thorax is unremarkable. IMPRESSION: Moderate left pleural effusion with probable underlying atelectasis or infiltrate. Electronically Signed   By: Marijo Conception, M.D.   On: 06/18/2018 12:37   US Abdomen Complete  Result Date: 06/17/2018 CLINICAL DATA:  Abdominal bloating. EXAM: ABDOMEN ULTRASOUND COMPLETE COMPARISON:  None. FINDINGS: Gallbladder: No gallstones or wall thickening visualized. No sonographic Murphy sign noted by sonographer. Common bile duct: Diameter: 3 mm which is within normal limits. Liver: Heterogeneous echotexture is noted with nodular hepatic contours consistent with hepatic cirrhosis. No focal abnormality is noted sonographically.  Portal vein is patent on color Doppler imaging with normal direction of blood flow towards the liver. IVC: Not well visualized due to overlying bowel gas and body habitus. Pancreas: Not well visualized due to overlying bowel gas and body habitus. Spleen: Maximum measured diameter of 13 cm with calculated volume of 683 cubic cm consistent with mild splenomegaly. Right Kidney: Length: 9.9 cm. Echogenicity within normal limits. No mass or hydronephrosis visualized. Left Kidney: Length: 10.8 cm. Echogenicity  within normal limits. No mass or hydronephrosis visualized. Abdominal aorta: Not visualized due to overlying bowel gas and body habitus. Other findings: Mild ascites is noted. 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 No focal hepatic abnormality is seen sonographically. Electronically Signed   By: Marijo Conception, M.D.   On: 06/17/2018 15:32    My personal review of EKG: Rhythm NSR, Rate  53 /min, QTc 422 , no Acute ST changes   Assessment & Plan:    Active Problems:   Atrial fibrillation (HCC)   Acute on chronic diastolic CHF (congestive heart failure) (HCC)   Ascites   Dyspnea -Appears to be in the setting of volume overload, due to multiple etiologies, in the setting of acute on chronic diastolic CHF and liver cirrhosis with volume overload contributing pulmonary edema as well, currently he has no hypoxia, but is significantly dyspneic, significantly dyspneic by moving in the bed only, please see discussion below regarding further management of liver cirrhosis and CHF.  Acute on chronic diastolic CHF -Under CHF pathway, started on IV Lasix, I have increased her Aldactone in the setting of liver cirrhosis, continue with daily weights, strict ins and outs, further management per cardiology. 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 -We will check hepatitis panel and ammonia level -We will request paracentesis, will give albumin -Upon 2 g salt diet  Atrial fibrillation -Continue with metoprolol and apixaban -Was to have cardioversion tomorrow, to be seen by cardiology  Pleural effusion -Sent with moderate left pleural effusion on imaging, this should improve with diuresis, will repeat x-ray in 2 days after diuresis, if no improvement likely will need thoracentesis       DVT Prophylaxis , on Eliquis and SCDs   AM Labs Ordered, also please review Full Orders  Family Communication: Admission, patients condition and plan of care including tests being ordered have been discussed with the patient  who indicate understanding and agree with the plan and Code Status.  Code Status Full  Likely DC to  Home  Condition GUARDED    Consults called: cardiology  Admission status: Patient, as patient significantly demented and dyspneic due to volume overload, in need of IV diuresis with close monitoring of his renal function especially in the setting of soft blood pressure, and will need more work-up for his liver failure  Time spent in minutes : 60 minutes   Phillips Climes M.D on 06/18/2018 at 6:08 PM  Between 7am to 7pm - Pager - [phone]. After 7pm go to [email or web address] - password [facility]  [facility] - Office  [phone]

## 2018-06-18 NOTE — Consult Note (Signed)
Cardiology Consultation:   Patient ID: Richard Saunders MRN: [id number]; DOB: 12/24/1940  Admit date: 06/18/2018 Date of Consult: 06/18/2018  Primary Care Provider: Tonia Ghent, MD Primary Cardiologist: Sinclair Grooms, MD    Patient Profile:   Richard Saunders is a 78 y.o. male with a hx of atrial fibrillation  who is being seen today for the evaluation of atrial fibrillation and volume overload  at the request of [place]  History of Present Illness:   [name] is a 78 yo with hx of persistent atrial fib (Dx 05/28/18; CHADSVASc Score of 3), HTN, LBBB, Aortic regurg, hx rheumatic fever and ? Liver dz.   The pt was seen by PCP on 05/28/18   Found to be in atrial flutter with RVR then Arial fibrillation   Started on Eliquis and metoprolol     Had a URI just prior  Pt denied palpitations  He was seen in AFib clinic in Jun 16 2018 still with SOB despite increased diruests   Wt down  Still had edema    BNP81     Plan was for rate control and then DCCV  On Friday  The pt says there has been no real change in how he is breathing or in his edema   Breathing is short   Today though when he woke up he coughed blood   Says he came in to get it checked out    Echo LVEF 55 to 60^ Past Medical History:  Diagnosis Date  . Allergy   . Anxiety   . Aortic insufficiency    a. 04/2018 Echo: EF 55-60%. Mild to Mod MR. Mild AI/TR.  Marland Kitchen Atrial fibrillation ([place])    a. Dx 04/2018. CHA2DS2VASc = 4-->Eliquis.  . Depression   . H/O: rheumatic fever 2012   (per patient) - eval by [facility] LBBB with PVC's  . History of chicken pox   . Hypertension   . LBBB (left bundle branch block)   . Organic impotence   . PVC's (premature ventricular contractions)     Past Surgical History:  Procedure Laterality Date  . Cautery for nosebleeds    . Right elbow bursa removed    . TONSILLECTOMY AND ADENOIDECTOMY  1947       Inpatient Medications: Scheduled Meds: . sodium chloride flush  3 mL  Intravenous Once   Continuous Infusions:  PRN Meds:   Allergies:    Allergies  Allergen Reactions  . Colchicine Diarrhea  . Fluticasone Propionate Other (See Comments)    He didn't tolerate the taste/smell prev  . Nasacort [Triamcinolone] Other (See Comments)    Didn't tolerate    Social History:   Social History   Socioeconomic History  . Marital status: Single    Spouse name: Not on file  . Number of children: Not on file  . Years of education: Not on file  . Highest education level: Not on file  Occupational History  . 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 Financial resource strain: Not on file  . Food insecurity:    Worry: Not on file    Inability: Not on file  . Transportation needs:    Medical: Not on file    Non-medical: Not on file  Tobacco Use  . Smoking status: Former Smoker    Years: 40.00    Last attempt to quit: 04/29/1978    Years since quitting: 40.1  . Smokeless tobacco: Never  Used  Substance and Sexual Activity  . Alcohol use: Yes    Alcohol/week: 0.0 standard drinks    Comment: Rare  . Drug use: No  . Sexual activity: Not on file  Lifestyle  . Physical activity:    Days per week: Not on file    Minutes per session: Not on file  . Stress: Not on file  Relationships  . Social connections:    Talks on phone: Not on file    Gets together: Not on file    Attends religious service: Not on file    Active member of club or organization: Not on file    Attends meetings of clubs or organizations: Not on file    Relationship status: Not on file  . Intimate partner violence:    Fear of current or ex partner: Not on file    Emotionally abused: Not on file    Physically abused: Not on file    Forced sexual activity: Not on file  Other Topics Concern  . 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 Cancer Mother        ovarian, uterine  . Aneurysm Mother        AAA in 12  . Cancer Father        Prostate  . COPD Father   . Prostate cancer Father   . Diabetes Other   . Heart disease Other        CAD <60 male  . Hypertension Other   . Heart disease Maternal Grandfather        died from possible "heart attack" in early 70's  . Heart failure Maternal Uncle   . Colon cancer Neg Hx      ROS:  Please see the history of present illness.   All other ROS reviewed and negative.     Physical Exam/Data:   Vitals:   06/18/18 1511 06/18/18 1515 06/18/18 1545 06/18/18 1615  BP:  (!) 103/50 115/67 (!) 114/53  Pulse: 68 (!) 54 74 (!) 50  Resp: (!) 24 14 18  (!) 22  Temp:      TempSrc:      SpO2: 95% 95% 96% 93%   No intake or output data in the 24 hours ending 06/18/18 1626 Last 3 Weights 06/16/2018 06/11/2018 06/05/2018  Weight (lbs) 240 lb 240 lb 6 oz 243 lb  Weight (kg) 108.863 kg 109.033 kg 110.224 kg     There is no height or weight on file to calculate BMI.  General:  Well nourished, well developed, in no acute distress HEENT: normal Lymph: no adenopathy Neck:  JVP is increased Vascular: No carotid bruits; FA pulses 2+ bilaterally without bruits  Cardiac:  Irreg irreg   S1, S2   No S3  No signif murmurs   Lungs:  Cecreased airflow bilateral   Decreased BS at bases  Abd: Soft   Distended    Ext: 1-2+ edema Musculoskeletal:  No deformities, BUE and BLE strength normal and equal Skin: warm and dry  Neuro:  CNs 2-12 intact, no focal abnormalities noted Psych:  Normal affect   EKG:  The EKG was personally reviewed and demonstrates:  Atrial fibrillation   61 bpm   Nonspecific ST changes    Telemetry:  Telemetry  was personally reviewed and demonstrates:  Atrial fib      Laboratory Data:  Chemistry Recent Labs  Lab 06/11/18 1644 06/18/18 1211  NA 143 142  K 4.6 4.4  CL 98 100  CO2 36* 32  GLUCOSE 104* 111*   BUN 29* 27*  CREATININE 1.01 1.11  CALCIUM 9.5 9.2  GFRNONAA  --  >60  GFRAA  --  >60  ANIONGAP  --  10    Recent Labs  Lab 06/11/18 1644  PROT 6.7  ALBUMIN 3.5  AST 37  ALT 19  ALKPHOS 62  BILITOT 2.7*   Hematology Recent Labs  Lab 06/11/18 1644 06/18/18 1211  WBC 8.0 6.1  RBC 4.38 4.07*  HGB 15.1 13.8  HCT 45.0 43.3  MCV 102.7* 106.4*  MCH  --  33.9  MCHC 33.5 31.9  RDW 14.4 14.6  PLT 186.0 155   Cardiac EnzymesNo results for input(s): TROPONINI in the last 168 hours.  Recent Labs  Lab 06/18/18 1207  TROPIPOC 0.01    BNP Recent Labs  Lab 06/11/18 1644  PROBNP 81.0    DDimer No results for input(s): DDIMER in the last 168 hours.  Radiology/Studies:  Dg Chest 2 View  Result Date: 06/18/2018 CLINICAL DATA:  Shortness of breath, hemoptysis. EXAM: CHEST - 2 VIEW COMPARISON:  Radiographs of June 16, 2018. FINDINGS: Stable cardiomediastinal silhouette. Moderate left pleural effusion is noted with probable underlying atelectasis or infiltrate. No pneumothorax is noted. Right lung is clear. Bony thorax is unremarkable. IMPRESSION: Moderate left pleural effusion with probable underlying atelectasis or infiltrate. Electronically Signed   By: Marijo Conception, M.D.   On: 06/18/2018 12:37   Dg Chest 2 View  Result Date: 06/16/2018 CLINICAL DATA:  Swelling in the legs and feet EXAM: CHEST - 2 VIEW COMPARISON:  05/28/2018, 12/21/2017 FINDINGS: Left diaphragm is elevated. Small moderate left pleural effusion without significant change. Airspace disease at the lingula and left base. Right lung is clear. Stable cardiomediastinal silhouette. No pneumothorax. IMPRESSION: Elevated left diaphragm with small moderate left pleural effusion and left basilar and lingular atelectasis or pneumonia. Electronically Signed   By: Donavan Foil M.D.   On: 06/16/2018 15:10   US Abdomen Complete  Result Date: 06/17/2018 CLINICAL DATA:  Abdominal bloating. EXAM: ABDOMEN ULTRASOUND  COMPLETE COMPARISON:  None. FINDINGS: Gallbladder: No gallstones or wall thickening visualized. No sonographic Murphy sign noted by sonographer. Common bile duct: Diameter: 3 mm which is within normal limits. Liver: Heterogeneous echotexture is noted with nodular hepatic contours consistent with hepatic cirrhosis. No focal abnormality is noted sonographically. Portal vein is patent on color Doppler imaging with normal direction of blood flow towards the liver. IVC: Not well visualized due to overlying bowel gas and body habitus. Pancreas: Not well visualized due to overlying bowel gas and body habitus. Spleen: Maximum measured diameter of 13 cm with calculated volume of 683 cubic cm consistent with mild splenomegaly. Right Kidney: Length: 9.9 cm. Echogenicity within normal limits. No mass or hydronephrosis visualized. Left Kidney: Length: 10.8 cm. Echogenicity within normal limits. No mass or hydronephrosis visualized. Abdominal aorta: Not visualized due to overlying bowel gas and body habitus. Other findings: Mild ascites is noted. 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 No focal hepatic abnormality is seen sonographically. Electronically Signed   By: Marijo Conception, M.D.   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

## 2018-06-19 ENCOUNTER — Encounter (HOSPITAL_COMMUNITY)
Admission: EM | Disposition: A | Discharge status: Home / Self Care | Payer: Self-pay | Source: Home / Self Care | Attending: Internal Medicine

## 2018-06-19 ENCOUNTER — Ambulatory Visit (HOSPITAL_COMMUNITY): Admission: RE | Admit: 2018-06-19 | Payer: Medicare HMO | Source: Home / Self Care | Admitting: Cardiology

## 2018-06-19 ENCOUNTER — Encounter (HOSPITAL_COMMUNITY): Payer: Self-pay | Admitting: Certified Registered Nurse Anesthetist

## 2018-06-19 ENCOUNTER — Inpatient Hospital Stay (HOSPITAL_COMMUNITY): Payer: Medicare HMO

## 2018-06-19 ENCOUNTER — Encounter (HOSPITAL_COMMUNITY): Payer: Self-pay | Admitting: Student

## 2018-06-19 DIAGNOSIS — R0602 Shortness of breath: Secondary | ICD-10-CM

## 2018-06-19 DIAGNOSIS — R188 Other ascites: Secondary | ICD-10-CM

## 2018-06-19 DIAGNOSIS — K7460 Unspecified cirrhosis of liver: Principal | ICD-10-CM

## 2018-06-19 DIAGNOSIS — I4891 Unspecified atrial fibrillation: Secondary | ICD-10-CM

## 2018-06-19 HISTORY — PX: IR PARACENTESIS: IMG2679

## 2018-06-19 LAB — BASIC METABOLIC PANEL
Anion gap: 11 (ref 5–15)
BUN: 26 mg/dL — ABNORMAL HIGH (ref 8–23)
CALCIUM: 9.3 mg/dL (ref 8.9–10.3)
CO2: 33 mmol/L — ABNORMAL HIGH (ref 22–32)
Chloride: 97 mmol/L — ABNORMAL LOW (ref 98–111)
Creatinine, Ser: 1.14 mg/dL (ref 0.61–1.24)
Glucose, Bld: 103 mg/dL — ABNORMAL HIGH (ref 70–99)
Potassium: 3.9 mmol/L (ref 3.5–5.1)
Sodium: 141 mmol/L (ref 135–145)

## 2018-06-19 LAB — VITAMIN B12: Vitamin B-12: 794 pg/mL (ref 180–914)

## 2018-06-19 LAB — RETICULOCYTES
Immature Retic Fract: 8 % (ref 2.3–15.9)
RBC.: 3.74 MIL/uL — ABNORMAL LOW (ref 4.22–5.81)
Retic Count, Absolute: 56.1 10*3/uL (ref 19.0–186.0)
Retic Ct Pct: 1.5 % (ref 0.4–3.1)

## 2018-06-19 LAB — GRAM STAIN

## 2018-06-19 LAB — CBC
HCT: 42.8 % (ref 39.0–52.0)
Hemoglobin: 13.5 g/dL (ref 13.0–17.0)
MCH: 33.3 pg (ref 26.0–34.0)
MCHC: 31.5 g/dL (ref 30.0–36.0)
MCV: 105.4 fL — ABNORMAL HIGH (ref 80.0–100.0)
Platelets: 139 10*3/uL — ABNORMAL LOW (ref 150–400)
RBC: 4.06 MIL/uL — ABNORMAL LOW (ref 4.22–5.81)
RDW: 14.6 % (ref 11.5–15.5)
WBC: 5.2 10*3/uL (ref 4.0–10.5)
nRBC: 0 % (ref 0.0–0.2)

## 2018-06-19 LAB — HEPATITIS PANEL, ACUTE
HCV Ab: <0.1 s/co ratio (ref 0.0–0.9)
Hep A IgM: NEGATIVE
Hep B C IgM: NEGATIVE
Hepatitis B Surface Ag: NEGATIVE

## 2018-06-19 LAB — BODY FLUID CELL COUNT WITH DIFFERENTIAL
Lymphs, Fluid: 58 %
MONOCYTE-MACROPHAGE-SEROUS FLUID: 30 % — AB (ref 50–90)
Neutrophil Count, Fluid: 12 % (ref 0–25)
Total Nucleated Cell Count, Fluid: 725 cu mm (ref 0–1000)

## 2018-06-19 LAB — LACTATE DEHYDROGENASE: LDH: 164 U/L (ref 98–192)

## 2018-06-19 SURGERY — CANCELLED PROCEDURE

## 2018-06-19 MED ORDER — LIDOCAINE HCL 1 % IJ SOLN
INTRAMUSCULAR | Status: AC
  Filled 2018-06-19: qty 20

## 2018-06-19 MED ORDER — POLYETHYLENE GLYCOL 3350 17 GM/SCOOP PO POWD: 17.0000 g | Freq: Two times a day (BID) | ORAL | Status: DC | PRN

## 2018-06-19 MED ORDER — ALPRAZOLAM 0.25 MG PO TABS: 0.1250 mg | ORAL_TABLET | Freq: Every day | ORAL | Status: DC | PRN

## 2018-06-19 MED ORDER — FUROSEMIDE 10 MG/ML IJ SOLN
40.0000 mg | Freq: Two times a day (BID) | INTRAMUSCULAR | Status: DC
  Administered 2018-06-19 – 2018-06-20 (×2): 40 mg via INTRAVENOUS
  Filled 2018-06-19 (×2): qty 4

## 2018-06-19 MED ORDER — LIDOCAINE HCL 1 % IJ SOLN
INTRAMUSCULAR | Status: DC | PRN
  Administered 2018-06-19: 10 mL

## 2018-06-19 MED ORDER — POLYETHYLENE GLYCOL 3350 17 G PO PACK: 17.0000 g | PACK | Freq: Every day | ORAL | Status: DC | PRN

## 2018-06-19 NOTE — Evaluation (Signed)
Physical Therapy Evaluation & Discharge Patient Details Name: Richard Saunders MRN: [id number] DOB: 09-20-1940 Today's Date: 06/19/2018   History of Present Illness  Pt is a 78 y.o. male admitted 06/18/18 with DOE and abdominal/LE swelling; worked up for CHF exacerbation. Also with afib; returned to sinus rhythm without need for cardioversion. PMH includes CHF, HTN, afib, depression, anxiety.    Clinical Impression  Patient evaluated by Physical Therapy with no further acute PT needs identified. PTA, pt indep and lives alone. Today, pt indep with ADLs and mobility. SpO2 down to 88% on RA with ambulation, returning to >90% with rest break. Score of 21/24 on Dynamic Gait Index does not place pt at increased risk for falls with higher level balance activities. Educ regarding exercise with CHF, energy conservation strategies, and SpO2 monitoring; reinforced importance of daily weight and reduced salt intake. All education has been completed and the patient has no further questions. PT is signing off. Thank you for this referral.    Follow Up Recommendations No PT follow up    Equipment Recommendations  None recommended by PT    Recommendations for Other Services       Precautions / Restrictions Precautions Precautions: None Restrictions Weight Bearing Restrictions: No      Mobility  Bed Mobility Overal bed mobility: Independent                Transfers Overall transfer level: Independent                  Ambulation/Gait Ambulation/Gait assistance: Independent Gait Distance (Feet): 500 Feet Assistive device: None Gait Pattern/deviations: Step-through pattern;Decreased stride length Gait velocity: Decreased Gait velocity interpretation: 1.31 - 2.62 ft/sec, indicative of limited community ambulator General Gait Details: SpO2 down to 88% on RA, returning to 90% with standing rest break  Stairs            Wheelchair Mobility    Modified Rankin (Stroke  Patients Only)       Balance Overall balance assessment: Independent           Standing balance-Leahy Scale: Good                   Standardized Balance Assessment Standardized Balance Assessment : Dynamic Gait Index   Dynamic Gait Index Level Surface: Normal Change in Gait Speed: Mild Impairment Gait with Horizontal Head Turns: Normal Gait with Vertical Head Turns: Normal Gait and Pivot Turn: Mild Impairment Step Over Obstacle: Mild Impairment Step Around Obstacles: Normal Steps: Normal Total Score: 21       Pertinent Vitals/Pain Pain Assessment: No/denies pain    Home Living Family/patient expects to be discharged to:: Private residence Living Arrangements: Alone Available Help at Discharge: Family;Available PRN/intermittently Type of Home: House Home Access: Stairs to enter Entrance Stairs-Rails: Right Entrance Stairs-Number of Steps: 3 Home Layout: One level Home Equipment: None      Prior Function Level of Independence: Independent         Comments: Drives     Hand Dominance        Extremity/Trunk Assessment   Upper Extremity Assessment Upper Extremity Assessment: Overall WFL for tasks assessed    Lower Extremity Assessment Lower Extremity Assessment: Overall WFL for tasks assessed       Communication   Communication: HOH  Cognition Arousal/Alertness: Awake/alert Behavior During Therapy: WFL for tasks assessed/performed Overall Cognitive Status: Within Functional Limits for tasks assessed  General Comments      Exercises     Assessment/Plan    PT Assessment Patent does not need any further PT services  PT Problem List         PT Treatment Interventions      PT Goals (Current goals can be found in the Care Plan section)  Acute Rehab PT Goals PT Goal Formulation: All assessment and education complete, DC therapy    Frequency     Barriers to discharge         Co-evaluation               AM-PAC PT "6 Clicks" Mobility  Outcome Measure Help needed turning from your back to your side while in a flat bed without using bedrails?: None Help needed moving from lying on your back to sitting on the side of a flat bed without using bedrails?: None Help needed moving to and from a bed to a chair (including a wheelchair)?: None Help needed standing up from a chair using your arms (e.g., wheelchair or bedside chair)?: None Help needed to walk in hospital room?: None Help needed climbing 3-5 steps with a railing? : None 6 Click Score: 24    End of Session   Activity Tolerance: Patient tolerated treatment well Patient left: in bed;with call bell/phone within reach;with family/visitor present Nurse Communication: Mobility status PT Visit Diagnosis: Other abnormalities of gait and mobility (R26.89)    Time: 3567-0141 PT Time Calculation (min) (ACUTE ONLY): 13 min   Charges:   PT Evaluation $PT Eval Low Complexity: [place], PT, DPT Acute Rehabilitation Services  Pager [phone] Office [place] 06/19/2018, 4:55 PM

## 2018-06-19 NOTE — Progress Notes (Signed)
PROGRESS NOTE    Richard Saunders  LKG:[id number] DOB: 1941-02-08 DOA: 06/18/2018 PCP: Tonia Ghent, MD   Brief Narrative:  78 year old with past medical history relevant for rheumatic heart disease with moderate MR, relatively recent diagnosis of atrial fibrillation/flutter on apixaban, chronic diastolic heart failure, recent diagnosis of cirrhosis with ascites the emergency department with increasing dyspnea, lower extremity edema, shortness of breath with exertion, increasing girth and was felt to have heart failure secondary to atrial fibrillation with plan for cardioversion.   Assessment & Plan:   Active Problems:   Atrial fibrillation (HCC)   Acute on chronic diastolic CHF (congestive heart failure) (HCC)   Ascites   #) Edema/dyspnea on exertion/shortness of breath: At this time it is felt that patient symptoms seem to be more related to his increasing abdominal girth secondary to decompensated cirrhosis. -Diuresis per below  #) Decompensated cirrhosis: Suspect most likely patient has a new diagnosis of [place] cirrhosis.  His LFTs are moderately elevated and his INR is somewhat elevated as well but he is on apixaban as well. 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  Will consider thoracentesis after paracentesis.  #) Atrial fibrillation/flutter: Patient initially was planned for admission for cardioversion however he is spontaneously converted to a wandering atrial pacemaker this morning. -Continue apixaban 5 mg twice daily - Continue metoprolol succinate 37.5 mg daily  #) Pain/psych: -Continue PRN alprazolam  Fluids: Restricted Electrolytes: Monitor and  supplement Nutrition: Heart healthy diet  Prophylaxis: On apixaban  Disposition: Pending work-up of cirrhosis and pleural effusion  Full code    Consultants:   Cardiology  Interventional radiology  Procedures:   Paracentesis 06/19/2018 pending  Antimicrobials:   None   Subjective: This morning patient reports he is feeling somewhat better.  He denies any chest pain, nausea, vomiting, diarrhea.  He does report some early satiety.  He denies any cough or congestion.  Objective: Vitals:   06/19/18 0034 06/19/18 0510 06/19/18 0511 06/19/18 0912  BP: (!) 117/58  122/62 105/65  Pulse: (!) 53  86 62  Resp: 18  20   Temp: 97.9 F (36.6 C)  (!) 97.5 F (36.4 C)   TempSrc: Oral  Oral   SpO2: 96%  93%   Weight:  105.5 kg    Height:        Intake/Output Summary (Last 24 hours) at 06/19/2018 1012 Last data filed at 06/19/2018 0659 Gross per 24 hour  Intake 240 ml  Output 1700 ml  Net -1460 ml   Filed Weights   06/18/18 1757 06/19/18 0510  Weight: 104.3 kg 105.5 kg    Examination:  General exam: Appears calm and comfortable  Respiratory system: Clear to auscultation.  No increased work of breathing, diminished lung sounds at bases particularly on left, no wheezes, crackles, rhonchi, Rales Cardiovascular system: Irregularly regular, no murmurs Gastrointestinal system: Soft, mildly protuberant, some shifting dullness, no rebound or guarding, no organomegaly, plus bowel sounds Central nervous system: Alert and oriented.  Grossly intact, moving all extremities Extremities: 3+ lower extremity edema Skin: No rashes over visible skin Psychiatry: Judgement and insight appear normal. Mood & affect appropriate.     Data Reviewed: I have personally reviewed following labs and imaging studies  CBC:  Recent Labs  Lab 06/18/18 1211 06/19/18 0522  WBC 6.1 5.2  HGB 13.8 13.5  HCT 43.3 42.8  MCV 106.4* 105.4*  PLT 155 244*   Basic Metabolic Panel: Recent Labs  Lab  06/18/18 1211 06/19/18 0522  NA 142 141  K 4.4 3.9  CL 100 97*  CO2 32 33*  GLUCOSE 111* 103*  BUN 27* 26*  CREATININE 1.11 1.14  CALCIUM 9.2 9.3   GFR: Estimated Creatinine Clearance: 69.2 mL/min (by C-G formula based on SCr of 1.14 mg/dL). Liver Function Tests: Recent Labs  Lab 06/18/18 1211  AST 42*  ALT 22  ALKPHOS 47  BILITOT 3.0*  PROT 6.8  ALBUMIN 3.3*   No results for input(s): LIPASE, AMYLASE in the last 168 hours. Recent Labs  Lab 06/18/18 1722  AMMONIA 25   Coagulation Profile: Recent Labs  Lab 06/18/18 1211  INR 2.07   Cardiac Enzymes: No results for input(s): CKTOTAL, CKMB, CKMBINDEX, TROPONINI in the last 168 hours. BNP (last 3 results) Recent Labs    06/05/18 1356 06/11/18 1644  PROBNP 116.0* 81.0   HbA1C: No results for input(s): HGBA1C in the last 72 hours. CBG: No results for input(s): GLUCAP in the last 168 hours. Lipid Profile: No results for input(s): CHOL, HDL, LDLCALC, TRIG, CHOLHDL, LDLDIRECT in the last 72 hours. Thyroid Function Tests: No results for input(s): TSH, T4TOTAL, FREET4, T3FREE, THYROIDAB in the last 72 hours. Anemia Panel: Recent Labs    06/19/18 0759  VITAMINB12 794  RETICCTPCT 1.5   Sepsis Labs: No results for input(s): PROCALCITON, LATICACIDVEN in the last 168 hours.  No results found for this or any previous visit (from the past 240 hour(s)).       Radiology Studies: Dg Chest 2 View  Result Date: 06/18/2018 CLINICAL DATA:  Shortness of breath, hemoptysis. EXAM: CHEST - 2 VIEW COMPARISON:  Radiographs of June 16, 2018. FINDINGS: Stable cardiomediastinal silhouette. Moderate left pleural effusion is noted with probable underlying atelectasis or infiltrate. No pneumothorax is noted. Right lung is clear. Bony thorax is unremarkable. IMPRESSION: Moderate left pleural effusion with probable underlying atelectasis or infiltrate. Electronically Signed   By: Marijo Conception, M.D.   On: 06/18/2018 12:37    US Abdomen Complete  Result Date: 06/17/2018 CLINICAL DATA:  Abdominal bloating. EXAM: ABDOMEN ULTRASOUND COMPLETE COMPARISON:  None. FINDINGS: Gallbladder: No gallstones or wall thickening visualized. No sonographic Murphy sign noted by sonographer. Common bile duct: Diameter: 3 mm which is within normal limits. Liver: Heterogeneous echotexture is noted with nodular hepatic contours consistent with hepatic cirrhosis. No focal abnormality is noted sonographically. Portal vein is patent on color Doppler imaging with normal direction of blood flow towards the liver. IVC: Not well visualized due to overlying bowel gas and body habitus. Pancreas: Not well visualized due to overlying bowel gas and body habitus. Spleen: Maximum measured diameter of 13 cm with calculated volume of 683 cubic cm consistent with mild splenomegaly. Right Kidney: Length: 9.9 cm. Echogenicity within normal limits. No mass or hydronephrosis visualized. Left Kidney: Length: 10.8 cm. Echogenicity within normal limits. No mass or hydronephrosis visualized. Abdominal aorta: Not visualized due to overlying bowel gas and body habitus. Other findings: Mild ascites is noted. 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 No focal hepatic abnormality is seen sonographically. Electronically Signed   By: Marijo Conception, M.D.   On: 06/17/2018 15:32        Scheduled Meds: . apixaban  5 mg Oral BID  . cromolyn  2 drop Both Eyes [id number]  . furosemide  40 mg Intravenous BID  . lidocaine      . metoprolol succinate  37.5 mg Oral Daily  . spironolactone  25 mg Oral BID   Continuous Infusions: . albumin human       LOS: 1 day    Time spent: [place], MD [facility]  If 7PM-7AM, please contact night-coverage [email or web address] Password [email or web address] 06/19/2018, 10:12 AM

## 2018-06-19 NOTE — Discharge Instructions (Signed)

## 2018-06-19 NOTE — Progress Notes (Signed)
I responded to a [place] to provide Advance Directive information for the patient and family. I visited the patient's room, however, the patient was away having tests completed. I spoke with the patient's brother and left the AD information for the patient to review and complete at a later time. I shared that the Chaplain is available to provide additional support as needed or requested.    06/19/18 1000  Clinical Encounter Type  Visited With Patient not available;Family  Visit Type Spiritual support  Referral From Nurse  Consult/Referral To Chaplain  Spiritual Encounters  Spiritual Needs Prayer;Literature    Chaplain Dr Redgie Grayer

## 2018-06-19 NOTE — Progress Notes (Addendum)
The patient has been seen in conjunction with Ina Homes, MD. All aspects of care have been considered and discussed. The patient has been personally interviewed, examined, and all clinical data has been reviewed.   All EKGs have been reviewed.  Since admission the patient has been in sinus rhythm with PACs.  No documented atrial flutter/fibrillation.  Therefore cardioversion is not required today.  The patient has been having dyspnea and fatigue.  Probably atrial fibrillation related.  He is currently comfortable, able to lie flat, and denies any palpitations or flutter.  Recent BNP levels have been less than 200.  No specific other cardiac recommendations at this time.  Agree with work-up for cirrhosis.  Progress Note  Patient Name: Richard Saunders Date of Encounter: 06/19/2018  Primary Cardiologist: Sinclair Grooms, MD   Subjective   Patient feels better this AM. Questions about whether his heart or liver is causing his [facility]. He does endorse symptoms of platypnea. States he has never been told he has cirrhosis.   Inpatient Medications    Scheduled Meds: . apixaban  5 mg Oral BID  . cromolyn  2 drop Both Eyes BH-q7a  . metoprolol succinate  37.5 mg Oral Daily  . spironolactone  25 mg Oral BID   Continuous Infusions: . albumin human     PRN Meds: albumin human   Vital Signs    Vitals:   06/18/18 2002 06/19/18 0034 06/19/18 0510 06/19/18 0511  BP: 114/62 (!) 117/58  122/62  Pulse: 72 (!) 53  86  Resp: 18 18  20   Temp: 97.8 F (36.6 C) 97.9 F (36.6 C)  (!) 97.5 F (36.4 C)  TempSrc: Oral Oral  Oral  SpO2: 94% 96%  93%  Weight:   105.5 kg   Height:        Intake/Output Summary (Last 24 hours) at 06/19/2018 0847 Last data filed at 06/19/2018 0659 Gross per 24 hour  Intake 240 ml  Output 1700 ml  Net -1460 ml   Filed Weights   06/18/18 1757 06/19/18 0510  Weight: 104.3 kg 105.5 kg   Telemetry    Converted to sinus with wandering atrial  pacemaker - Personally Reviewed  ECG    Sinus with wandering atrial pacemaker - Personally Reviewed  Physical Exam   Today's Vitals   06/18/18 2200 06/19/18 0034 06/19/18 0510 06/19/18 0511  BP:  (!) 117/58  122/62  Pulse:  (!) 53  86  Resp:  18  20  Temp:  97.9 F (36.6 C)  (!) 97.5 F (36.4 C)  TempSrc:  Oral  Oral  SpO2:  96%  93%  Weight:   105.5 kg   Height:      PainSc: 0-No pain      Body mass index is 30.69 kg/m.  GEN: No acute distress.   Neck: No JVD Cardiac: Irregularly irregular rhythm, crescendo-decrescendo murmur  Respiratory: Bibasilar crackles . GI: Soft, nontender, distended  MS: Moderate to severe bilateral pitting edema of the LEs; No deformity. Neuro:  Nonfocal  Psych: Normal affect   Labs    Chemistry Recent Labs  Lab 06/18/18 1211 06/19/18 0522  NA 142 141  K 4.4 3.9  CL 100 97*  CO2 32 33*  GLUCOSE 111* 103*  BUN 27* 26*  CREATININE 1.11 1.14  CALCIUM 9.2 9.3  PROT 6.8  --   ALBUMIN 3.3*  --   AST 42*  --   ALT 22  --   ALKPHOS 47  --  BILITOT 3.0*  --   GFRNONAA >60 >60  GFRAA >60 >60  ANIONGAP 10 11    Hematology Recent Labs  Lab 06/18/18 1211 06/19/18 0522 06/19/18 0759  WBC 6.1 5.2  --   RBC 4.07* 4.06* 3.74*  HGB 13.8 13.5  --   HCT 43.3 42.8  --   MCV 106.4* 105.4*  --   MCH 33.9 33.3  --   MCHC 31.9 31.5  --   RDW 14.6 14.6  --   PLT 155 139*  --    Cardiac EnzymesNo results for input(s): TROPONINI in the last 168 hours.   Recent Labs  Lab 06/18/18 1207  TROPIPOC 0.01    BNPNo results for input(s): BNP, PROBNP in the last 168 hours.   DDimer No results for input(s): DDIMER in the last 168 hours.   Radiology    Dg Chest 2 View  Result Date: 06/18/2018 CLINICAL DATA:  Shortness of breath, hemoptysis. EXAM: CHEST - 2 VIEW COMPARISON:  Radiographs of June 16, 2018. FINDINGS: Stable cardiomediastinal silhouette. Moderate left pleural effusion is noted with probable underlying atelectasis or  infiltrate. No pneumothorax is noted. Right lung is clear. Bony thorax is unremarkable. IMPRESSION: Moderate left pleural effusion with probable underlying atelectasis or infiltrate. Electronically Signed   By: Marijo Conception, M.D.   On: 06/18/2018 12:37   US Abdomen Complete  Result Date: 06/17/2018 CLINICAL DATA:  Abdominal bloating. EXAM: ABDOMEN ULTRASOUND COMPLETE COMPARISON:  None. FINDINGS: Gallbladder: No gallstones or wall thickening visualized. No sonographic Murphy sign noted by sonographer. Common bile duct: Diameter: 3 mm which is within normal limits. Liver: Heterogeneous echotexture is noted with nodular hepatic contours consistent with hepatic cirrhosis. No focal abnormality is noted sonographically. Portal vein is patent on color Doppler imaging with normal direction of blood flow towards the liver. IVC: Not well visualized due to overlying bowel gas and body habitus. Pancreas: Not well visualized due to overlying bowel gas and body habitus. Spleen: Maximum measured diameter of 13 cm with calculated volume of 683 cubic cm consistent with mild splenomegaly. Right Kidney: Length: 9.9 cm. Echogenicity within normal limits. No mass or hydronephrosis visualized. Left Kidney: Length: 10.8 cm. Echogenicity within normal limits. No mass or hydronephrosis visualized. Abdominal aorta: Not visualized due to overlying bowel gas and body habitus. Other findings: Mild ascites is noted. 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 No focal hepatic abnormality is seen sonographically. Electronically Signed   By: Marijo Conception, M.D.   On: 06/17/2018 15:32   Cardiac Studies   TTE 05/29/2018  1. The left ventricle has normal systolic function of 10-62%. The cavity size is normal. There is no left ventricular wall thickness. Echo evidence of normal diastolic filling patterns.  2. Normal left atrial size.  3. Normal right atrial size.  4. The mitral valve Mildly thickened. Systolic  anterior motion anterior mitral leafet. Regurgitation is mild to moderate by color flow Doppler.  5. Normal tricuspid valve.  6. Tricuspid regurgitation is mild.  7. The aortic valve tricuspid. There is mild thickening and mild calcification of the aortic valve. Aortic valve regurgitation is mild by color flow Doppler.  8. No atrial level shunt detected by color flow Doppler.  Patient Profile     78 y.o. male atrial fibrillation and newly diagnosed cirrhosis who presented to the hospital with worsening SHOB and volume overload.   Assessment & Plan    [facility] - Multifactorial related to atrial fibrillation and volume overload  -  Suspect volume overload is more related to his cirrhosis than HF - Further management as outline below.   Atrial Fibrillation  - Converted to wandering atrial pacemaker this AM  - No need for cardioversion  - Continue metoprolol and eliquis   Volume overload  Newly diagnosed cirrhosis  - On furosemide and spironolactone  - Plan for diagnostic and therapeutic paracentesis today  - Would need EGD and if varices are noted may transition beta blocker to carvedilol  - Rest per primary   Will discuss the case further with Dr. [name].   For questions or updates, please contact [place] Please consult [email or web address] for contact info under Cardiology/STEMI.   Signed, Ina Homes, MD  06/19/2018, 8:47 AM

## 2018-06-19 NOTE — Care Management Note (Signed)
Case Management Note  Patient Details  Name: Richard Saunders MRN: [id number] Date of Birth: January 03, 1941  Subjective/Objective:    CHF               Action/Plan: Patient lives at home; Outpatient Primary MD for the patient is Tonia Ghent, MD; has private insurance with [facility] with prescription drug coverage; CM will continue to follow for progression of care.  Expected Discharge Date:   possibly 06/22/2018               Expected Discharge Plan:  Home/Self Care  Discharge planning Services  CM Consult  Status of Service:  In process, will continue to follow  Sherrilyn Rist [phone] 06/19/2018, 12:19 PM

## 2018-06-19 NOTE — Progress Notes (Signed)
Per Dr. Harrell Gave, cardioversion cancelled today for sinus brady rhythm.  Bedside nurse notified.  Vista Lawman, RN

## 2018-06-20 LAB — HEPATIC FUNCTION PANEL
ALT: 19 U/L (ref 0–44)
AST: 33 U/L (ref 15–41)
Albumin: 2.8 g/dL — ABNORMAL LOW (ref 3.5–5.0)
Alkaline Phosphatase: 41 U/L (ref 38–126)
Bilirubin, Direct: 0.7 mg/dL — ABNORMAL HIGH (ref 0.0–0.2)
Indirect Bilirubin: 2.1 mg/dL — ABNORMAL HIGH (ref 0.3–0.9)
Total Bilirubin: 2.8 mg/dL — ABNORMAL HIGH (ref 0.3–1.2)
Total Protein: 5.6 g/dL — ABNORMAL LOW (ref 6.5–8.1)

## 2018-06-20 LAB — CBC
HCT: 38.6 % — ABNORMAL LOW (ref 39.0–52.0)
Hemoglobin: 12.6 g/dL — ABNORMAL LOW (ref 13.0–17.0)
MCH: 34.1 pg — ABNORMAL HIGH (ref 26.0–34.0)
MCHC: 32.6 g/dL (ref 30.0–36.0)
MCV: 104.6 fL — ABNORMAL HIGH (ref 80.0–100.0)
Platelets: 115 10*3/uL — ABNORMAL LOW (ref 150–400)
RBC: 3.69 MIL/uL — ABNORMAL LOW (ref 4.22–5.81)
RDW: 14.6 % (ref 11.5–15.5)
WBC: 4.4 10*3/uL (ref 4.0–10.5)
nRBC: 0 % (ref 0.0–0.2)

## 2018-06-20 LAB — BASIC METABOLIC PANEL
Anion gap: 10 (ref 5–15)
BUN: 24 mg/dL — ABNORMAL HIGH (ref 8–23)
CO2: 34 mmol/L — ABNORMAL HIGH (ref 22–32)
Calcium: 8.9 mg/dL (ref 8.9–10.3)
GFR calc Af Amer: >60 mL/min (ref >60)
GFR calc non Af Amer: >60 mL/min (ref >60)
Glucose, Bld: 96 mg/dL (ref 70–99)
Potassium: 3.9 mmol/L (ref 3.5–5.1)
Sodium: 142 mmol/L (ref 135–145)

## 2018-06-20 LAB — MAGNESIUM: Magnesium: 2 mg/dL (ref 1.7–2.4)

## 2018-06-20 LAB — BASIC METABOLIC PANEL WITH GFR
Chloride: 98 mmol/L (ref 98–111)
Creatinine, Ser: 1.06 mg/dL (ref 0.61–1.24)

## 2018-06-20 LAB — HAPTOGLOBIN: Haptoglobin: 50 mg/dL (ref 34–355)

## 2018-06-20 MED ORDER — FUROSEMIDE 10 MG/ML IJ SOLN
80.0000 mg | Freq: Three times a day (TID) | INTRAMUSCULAR | Status: DC
  Administered 2018-06-20 – 2018-06-23 (×9): 80 mg via INTRAVENOUS
  Filled 2018-06-20 (×9): qty 8

## 2018-06-20 NOTE — Progress Notes (Signed)
PROGRESS NOTE    Richard Saunders  ZOX:[id number] DOB: [birth year] DOA: 06/18/2018 PCP: Tonia Ghent, MD   Brief Narrative:  78 year old with past medical history relevant for rheumatic heart disease with moderate MR, relatively recent diagnosis of atrial fibrillation/flutter on apixaban, chronic diastolic heart failure, recent diagnosis of cirrhosis with ascites the emergency department with increasing dyspnea, lower extremity edema, shortness of breath with exertion, increasing girth and was felt to have heart failure secondary to atrial fibrillation with plan for cardioversion.   Assessment & Plan:   Active Problems:   Atrial fibrillation (HCC)   Acute on chronic diastolic CHF (congestive heart failure) (HCC)   Ascites   Shortness of breath   #) Edema/dyspnea on exertion/shortness of breath: Likely secondary more to cirrhosis then atrial fibrillation with RVR. -Diuresis per below  #) Decompensated cirrhosis: Patient is status post paracentesis with 2.5 L of fluid removed on 06/19/2018.  He is not had much in the way of diuresis with 40 mg of IV furosemide -Increase furosemide to 80 mg 3 times daily -Increase prolactin to 50 mg twice daily -We will need outpatient EGD for varices, ultrasound every 6 months for cancer screening -Acute hepatitis panel negative, will consider additional work-up depending on GI recommendations as an outpatient -Paracentesis culture no growth to date paracentesis fluid showing 725 white blood cell counts predominantly lymphocytic and monocytic predominance -We will check fluid for albumin, total protein, LDH  #) Left-sided pleural effusion: Pending thoracentesis per IR  #) Atrial fibrillation/flutter: Patient initially was planned for admission for cardioversion however he is spontaneously converted to a wandering atrial pacemaker this morning. -Continue apixaban 5 mg twice daily - Continue metoprolol succinate 37.5 mg daily  #)  Pain/psych: -Continue PRN alprazolam  Fluids: Restricted Electrolytes: Monitor and supplement Nutrition: Heart healthy diet  Prophylaxis: On apixaban  Disposition: Pending work-up of cirrhosis and pleural effusion  Full code    Consultants:   Cardiology  Interventional radiology  Procedures:   Paracentesis 06/19/2018 2.5 L removed  Antimicrobials:   None   Subjective: This morning patient reports he is feeling somewhat somewhat less distended.  He continues to have lower extremity edema.  He denies any nausea, vomiting, diarrhea, cough, congestion, rhinorrhea.  Objective: Vitals:   06/19/18 1912 06/20/18 0455 06/20/18 0509 06/20/18 0853  BP: (!) 115/57 (!) 112/53  127/70  Pulse: 63 (!) 50 (!) 54 73  Resp: 18 18    Temp: 98.6 F (37 C) 97.8 F (36.6 C)    TempSrc: Oral Oral    SpO2: 95% 91%    Weight:  102.2 kg    Height:        Intake/Output Summary (Last 24 hours) at 06/20/2018 0958 Last data filed at 06/20/2018 0945 Gross per 24 hour  Intake 720 ml  Output 3050 ml  Net -2330 ml   Filed Weights   06/18/18 1757 06/19/18 0510 06/20/18 0455  Weight: 104.3 kg 105.5 kg 102.2 kg    Examination:  General exam: Appears calm and comfortable  Respiratory system: Clear to auscultation.  No increased work of breathing, diminished lung sounds at bases particularly on left, no wheezes, crackles, rhonchi, Rales Cardiovascular system: Irregularly regular, no murmurs Gastrointestinal system: Soft, nontender, no rebound or guarding, minimal shifting dullness, diminished from prior Central nervous system: Alert and oriented.  Grossly intact, moving all extremities Extremities: 2 + lower extremity edema Skin: No rashes over visible skin Psychiatry: Judgement and insight appear normal. Mood & affect appropriate.  Data Reviewed: I have personally reviewed following labs and imaging studies  CBC: Recent Labs  Lab 06/18/18 1211 06/19/18 0522 06/20/18 0404   WBC 6.1 5.2 4.4  HGB 13.8 13.5 12.6*  HCT 43.3 42.8 38.6*  MCV 106.4* 105.4* 104.6*  PLT 155 139* 751*   Basic Metabolic Panel: Recent Labs  Lab 06/18/18 1211 06/19/18 0522 06/20/18 0404  NA 142 141 142  K 4.4 3.9 3.9  CL 100 97* 98  CO2 32 33* 34*  GLUCOSE 111* 103* 96  BUN 27* 26* 24*  CREATININE 1.11 1.14 1.06  CALCIUM 9.2 9.3 8.9  MG  --   --  2.0   GFR: Estimated Creatinine Clearance: 73.3 mL/min (by C-G formula based on SCr of 1.06 mg/dL). Liver Function Tests: Recent Labs  Lab 06/18/18 1211 06/20/18 0404  AST 42* 33  ALT 22 19  ALKPHOS 47 41  BILITOT 3.0* 2.8*  PROT 6.8 5.6*  ALBUMIN 3.3* 2.8*   No results for input(s): LIPASE, AMYLASE in the last 168 hours. Recent Labs  Lab 06/18/18 1722  AMMONIA 25   Coagulation Profile: Recent Labs  Lab 06/18/18 1211  INR 2.07   Cardiac Enzymes: No results for input(s): CKTOTAL, CKMB, CKMBINDEX, TROPONINI in the last 168 hours. BNP (last 3 results) Recent Labs    06/05/18 1356 06/11/18 1644  PROBNP 116.0* 81.0   HbA1C: No results for input(s): HGBA1C in the last 72 hours. CBG: No results for input(s): GLUCAP in the last 168 hours. Lipid Profile: No results for input(s): CHOL, HDL, LDLCALC, TRIG, CHOLHDL, LDLDIRECT in the last 72 hours. Thyroid Function Tests: No results for input(s): TSH, T4TOTAL, FREET4, T3FREE, THYROIDAB in the last 72 hours. Anemia Panel: Recent Labs    06/19/18 0759  VITAMINB12 794  RETICCTPCT 1.5   Sepsis Labs: No results for input(s): PROCALCITON, LATICACIDVEN in the last 168 hours.  Recent Results (from the past 240 hour(s))  Gram stain     Status: None   Collection Time: 06/19/18 10:26 AM  Result Value Ref Range Status   Specimen Description PERITONEAL  Final   Special Requests NONE  Final   Gram Stain   Final    FEW WBC PRESENT, PREDOMINANTLY MONONUCLEAR NO ORGANISMS SEEN Performed at [facility], [address], [place], [place] [address]    Report  Status 06/19/2018 FINAL  Final  Culture, body fluid-bottle     Status: None (Preliminary result)   Collection Time: 06/19/18 10:26 AM  Result Value Ref Range Status   Specimen Description PERITONEAL  Final   Special Requests NONE  Final   Culture NO GROWTH < 12 HOURS  Final   Report Status PENDING  Incomplete         Radiology Studies: Dg Chest 2 View  Result Date: 06/18/2018 CLINICAL DATA:  Shortness of breath, hemoptysis. EXAM: CHEST - 2 VIEW COMPARISON:  Radiographs of June 16, 2018. FINDINGS: Stable cardiomediastinal silhouette. Moderate left pleural effusion is noted with probable underlying atelectasis or infiltrate. No pneumothorax is noted. Right lung is clear. Bony thorax is unremarkable. IMPRESSION: Moderate left pleural effusion with probable underlying atelectasis or infiltrate. Electronically Signed   By: Marijo Conception, M.D.   On: 06/18/2018 12:37   Ir Paracentesis  Result Date: 06/19/2018 INDICATION: Patient with history of ascites, abdominal distension. Request is made for diagnostic and therapeutic paracentesis. EXAM: ULTRASOUND GUIDED DIAGNOSTIC AND THERAPEUTIC PARACENTESIS MEDICATIONS: 10 mL 1% lidocaine COMPLICATIONS: None immediate. PROCEDURE: Informed written consent was obtained from  the patient after a discussion of the risks, benefits and alternatives to treatment. A timeout was performed prior to the initiation of the procedure. Initial ultrasound scanning demonstrates a moderate amount of ascites within the right lower abdominal quadrant. The right lower abdomen was prepped and draped in the usual sterile fashion. 1% lidocaine was used for local anesthesia. Following this, a 6 Fr Safe-T-Centesis catheter was introduced. An ultrasound image was saved for documentation purposes. The paracentesis was performed. The catheter was removed and a dressing was applied. The patient tolerated the procedure well without immediate post procedural complication. FINDINGS: A  total of approximately 2.5 L of hazy, pink fluid was removed. Samples were sent to the laboratory as requested by the clinical team. IMPRESSION: Successful ultrasound-guided diagnostic and therapeutic paracentesis yielding 2.5 liters of peritoneal fluid. Read by: Brynda Greathouse PA-C Electronically Signed   By: Lucrezia Europe M.D.   On: 06/19/2018 10:48        Scheduled Meds: . apixaban  5 mg Oral BID  . cromolyn  2 drop Both Eyes BH-q7a  . furosemide  80 mg Intravenous Q8H  . metoprolol succinate  37.5 mg Oral Daily  . spironolactone  25 mg Oral BID   Continuous Infusions: . albumin human       LOS: 2 days    Time spent: Narcissa, MD [facility]  If 7PM-7AM, please contact night-coverage [email or web address] Password [facility] 06/20/2018, 9:58 AM

## 2018-06-20 NOTE — Progress Notes (Signed)
Progress Note  Patient Name: Richard Saunders Date of Encounter: 06/20/2018  Primary Cardiologist: Belva Crome III, MD   Subjective   Swelling persists despite paracentesis. No chest pain.   Inpatient Medications    Scheduled Meds: . apixaban  5 mg Oral BID  . cromolyn  2 drop Both Eyes BH-q7a  . furosemide  80 mg Intravenous Q8H  . metoprolol succinate  37.5 mg Oral Daily  . spironolactone  25 mg Oral BID   Continuous Infusions: . albumin human     PRN Meds: albumin human, ALPRAZolam, lidocaine, polyethylene glycol   Vital Signs    Vitals:   06/19/18 1912 06/20/18 0455 06/20/18 0509 06/20/18 0853  BP: (!) 115/57 (!) 112/53  127/70  Pulse: 63 (!) 50 (!) 54 73  Resp: 18 18    Temp: 98.6 F (37 C) 97.8 F (36.6 C)    TempSrc: Oral Oral    SpO2: 95% 91%    Weight:  102.2 kg    Height:        Intake/Output Summary (Last 24 hours) at 06/20/2018 1137 Last data filed at 06/20/2018 0945 Gross per 24 hour  Intake 720 ml  Output 550 ml  Net 170 ml   Filed Weights   06/18/18 1757 06/19/18 0510 06/20/18 0455  Weight: 104.3 kg 105.5 kg 102.2 kg    Telemetry    nsr - Personally Reviewed  ECG    none - Personally Reviewed  Physical Exam   GEN: No acute distress.   Neck:7 cm JVD Cardiac: RRR, no murmurs, rubs, or gallops.  Respiratory: Clear to auscultation bilaterally. GI: Soft, nontender, non-distended  MS: 2+ edema; No deformity. Neuro:  Nonfocal  Psych: Normal affect   Labs    Chemistry Recent Labs  Lab 06/18/18 1211 06/19/18 0522 06/20/18 0404  NA 142 141 142  K 4.4 3.9 3.9  CL 100 97* 98  CO2 32 33* 34*  GLUCOSE 111* 103* 96  BUN 27* 26* 24*  CREATININE 1.11 1.14 1.06  CALCIUM 9.2 9.3 8.9  PROT 6.8  --  5.6*  ALBUMIN 3.3*  --  2.8*  AST 42*  --  33  ALT 22  --  19  ALKPHOS 47  --  41  BILITOT 3.0*  --  2.8*  GFRNONAA >60 >60 >60  GFRAA >60 >60 >60  ANIONGAP 10 11 10      Hematology Recent Labs  Lab 06/18/18 1211  06/19/18 0522 06/19/18 0759 06/20/18 0404  WBC 6.1 5.2  --  4.4  RBC 4.07* 4.06* 3.74* 3.69*  HGB 13.8 13.5  --  12.6*  HCT 43.3 42.8  --  38.6*  MCV 106.4* 105.4*  --  104.6*  MCH 33.9 33.3  --  34.1*  MCHC 31.9 31.5  --  32.6  RDW 14.6 14.6  --  14.6  PLT 155 139*  --  115*    Cardiac EnzymesNo results for input(s): TROPONINI in the last 168 hours.  Recent Labs  Lab 06/18/18 1207  TROPIPOC 0.01     BNPNo results for input(s): BNP, PROBNP in the last 168 hours.   DDimer No results for input(s): DDIMER in the last 168 hours.   Radiology    Dg Chest 2 View  Result Date: 06/18/2018 CLINICAL DATA:  Shortness of breath, hemoptysis. EXAM: CHEST - 2 VIEW COMPARISON:  Radiographs of June 16, 2018. FINDINGS: Stable cardiomediastinal silhouette. Moderate left pleural effusion is noted with probable underlying atelectasis or infiltrate. No  pneumothorax is noted. Right lung is clear. Bony thorax is unremarkable. IMPRESSION: Moderate left pleural effusion with probable underlying atelectasis or infiltrate. Electronically Signed   By: Marijo Conception, M.D.   On: 06/18/2018 12:37   Ir Paracentesis  Result Date: 06/19/2018 INDICATION: Patient with history of ascites, abdominal distension. Request is made for diagnostic and therapeutic paracentesis. EXAM: ULTRASOUND GUIDED DIAGNOSTIC AND THERAPEUTIC PARACENTESIS MEDICATIONS: 10 mL 1% lidocaine COMPLICATIONS: None immediate. PROCEDURE: Informed written consent was obtained from the patient after a discussion of the risks, benefits and alternatives to treatment. A timeout was performed prior to the initiation of the procedure. Initial ultrasound scanning demonstrates a moderate amount of ascites within the right lower abdominal quadrant. The right lower abdomen was prepped and draped in the usual sterile fashion. 1% lidocaine was used for local anesthesia. Following this, a 6 Fr Safe-T-Centesis catheter was introduced. An ultrasound image was  saved for documentation purposes. The paracentesis was performed. The catheter was removed and a dressing was applied. The patient tolerated the procedure well without immediate post procedural complication. FINDINGS: A total of approximately 2.5 L of hazy, pink fluid was removed. Samples were sent to the laboratory as requested by the clinical team. IMPRESSION: Successful ultrasound-guided diagnostic and therapeutic paracentesis yielding 2.5 liters of peritoneal fluid. Read by: Brynda Greathouse PA-C Electronically Signed   By: Lucrezia Europe M.D.   On: 06/19/2018 10:48    Cardiac Studies   2D echo reviewed  Patient Profile     78 y.o. male admitted with volume overload and found to have cirrhosis  Assessment & Plan    1. Volume overload - his echo shows no evidence of either systolic or diastolic heart failure. I discussed the importance of a low sodium diet. Weight is down after paracentesis. 2. Cirrhosis - as per primary team. Agree with aldactone. Consider asking GI evaluation.  3. Atrial fib/ flutter - he is in NSR with PAC's. No additional recs from an arrhythmia perspective.  [facility] will sign off.   Medication Recommendations:  Low sodium diet Other recommendations (labs, testing, etc):  none Follow up as an outpatient:  Dr. [name] in 4-6 weeks.  For questions or updates, please contact [place] Please consult [email or web address] for contact info under Cardiology/STEMI.      Signed, Cristopher Peru, MD  06/20/2018, 11:37 AM  Patient ID: Richard Saunders, male   DOB: 1940-05-29, 78 y.o.   MRN: [id number]

## 2018-06-21 LAB — BASIC METABOLIC PANEL WITH GFR
CO2: 34 mmol/L — ABNORMAL HIGH (ref 22–32)
Chloride: 94 mmol/L — ABNORMAL LOW (ref 98–111)

## 2018-06-21 LAB — CBC
HCT: 42.1 % (ref 39.0–52.0)
Hemoglobin: 13.8 g/dL (ref 13.0–17.0)
MCH: 34.2 pg — ABNORMAL HIGH (ref 26.0–34.0)
MCHC: 32.8 g/dL (ref 30.0–36.0)
MCV: 104.2 fL — ABNORMAL HIGH (ref 80.0–100.0)
Platelets: 121 10*3/uL — ABNORMAL LOW (ref 150–400)
RBC: 4.04 MIL/uL — ABNORMAL LOW (ref 4.22–5.81)
RDW: 14.4 % (ref 11.5–15.5)
WBC: 5.5 10*3/uL (ref 4.0–10.5)
nRBC: 0 % (ref 0.0–0.2)

## 2018-06-21 LAB — BASIC METABOLIC PANEL
Anion gap: 12 (ref 5–15)
BUN: 23 mg/dL (ref 8–23)
Calcium: 9.2 mg/dL (ref 8.9–10.3)
Creatinine, Ser: 1.22 mg/dL (ref 0.61–1.24)
GFR calc Af Amer: >60 mL/min (ref >60)
GFR calc non Af Amer: 57 mL/min — ABNORMAL LOW (ref >60)
Glucose, Bld: 104 mg/dL — ABNORMAL HIGH (ref 70–99)
Potassium: 3.6 mmol/L (ref 3.5–5.1)
Sodium: 140 mmol/L (ref 135–145)

## 2018-06-21 LAB — MAGNESIUM: Magnesium: 1.9 mg/dL (ref 1.7–2.4)

## 2018-06-21 MED ORDER — METOPROLOL SUCCINATE ER 25 MG PO TB24: 25.0000 mg | ORAL_TABLET | Freq: Every day | ORAL | Status: DC

## 2018-06-21 MED ORDER — LACTULOSE 10 GM/15ML PO SOLN
20.0000 g | Freq: Two times a day (BID) | ORAL | Status: DC
  Administered 2018-06-21 – 2018-06-23 (×4): 20 g via ORAL
  Filled 2018-06-21 (×4): qty 30

## 2018-06-21 MED ORDER — SPIRONOLACTONE 25 MG PO TABS
50.0000 mg | ORAL_TABLET | Freq: Two times a day (BID) | ORAL | Status: DC
  Administered 2018-06-21 – 2018-06-23 (×5): 50 mg via ORAL
  Filled 2018-06-21 (×5): qty 2

## 2018-06-21 MED ORDER — RIFAXIMIN 550 MG PO TABS
550.0000 mg | ORAL_TABLET | Freq: Two times a day (BID) | ORAL | Status: DC
  Administered 2018-06-21 – 2018-06-23 (×4): 550 mg via ORAL
  Filled 2018-06-21 (×4): qty 1

## 2018-06-21 NOTE — Progress Notes (Signed)
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   Assessment & Plan:   Active Problems:   Atrial fibrillation (HCC)   Acute on chronic diastolic CHF (congestive heart failure) (HCC)   Ascites   Shortness of breath   #) Edema/dyspnea on exertion/shortness of breath: Likely secondary more to cirrhosis then atrial fibrillation with RVR. -Diuresis per below  #) Decompensated cirrhosis: Patient is status post paracentesis with 2.5 L of fluid removed on 06/19/2018.  He is not had much in the way of diuresis with 40 mg of IV furosemide -Increase furosemide to 80 mg 3 times daily -Increase prolactin to 50 mg twice daily -We will need outpatient EGD for varices, ultrasound every 6 months for cancer screening -Acute hepatitis panel negative -Paracentesis culture no growth to date paracentesis fluid showing 725 white blood cell counts predominantly lymphocytic and monocytic predominance -[facility] consult  #) Left-sided pleural effusion: Pending thoracentesis per IR  #) Atrial fibrillation/flutter: Patient initially was planned for admission for cardioversion however he is spontaneously converted to a wandering atrial pacemaker this morning. -Continue apixaban 5 mg twice daily - Continue metoprolol succinate 37.5 mg daily  #) Pain/psych: -Continue PRN alprazolam  Fluids: Restricted Electrolytes: Monitor and supplement Nutrition: Heart healthy  diet  Prophylaxis: On apixaban  Disposition: Pending work-up of cirrhosis and pleural effusion  Full code    Consultants:   Cardiology  Interventional radiology  [facility]  Procedures:   Paracentesis 06/19/2018 2.5 L removed  Antimicrobials:   None   Subjective: This morning patient reports he is feeling well.  He reports lower extremity swelling is improved.  He denies any nausea, vomiting, diarrhea, chest pain.  Objective: Vitals:   06/20/18 1933 06/20/18 2200 06/21/18 0452 06/21/18 0944  BP: (!) 100/58 113/61 (!) 102/56 (!) 117/44  Pulse: (!) 52 (!) 50 60 (!) 54  Resp: 18  18   Temp: 98 F (36.7 C)  97.8 F (36.6 C) 97.7 F (36.5 C)  TempSrc: Oral  Oral Oral  SpO2: 94%  91% 93%  Weight:   100 kg   Height:        Intake/Output Summary (Last 24 hours) at 06/21/2018 0950 Last data filed at 06/21/2018 0900 Gross per 24 hour  Intake 800 ml  Output 3695 ml  Net -2895 ml   Filed Weights   06/19/18 0510 06/20/18 0455 06/21/18 0452  Weight: 105.5 kg 102.2 kg 100 kg    Examination:  General exam: Appears calm and comfortable  Respiratory system: Clear to auscultation.  No increased work of breathing, diminished lung sounds at bases particularly on left, no wheezes, crackles, rhonchi, Rales Cardiovascular system: Regularly regular, no murmurs Gastrointestinal system: Soft, nontender, no rebound or guarding, minimal shifting dullness, diminished from prior Central nervous system: Alert and oriented.  Grossly intact, moving all extremities Extremities: 2 + lower extremity edema Skin: No rashes over visible skin Psychiatry: Judgement and insight appear normal. Mood & affect appropriate.     Data Reviewed: I have personally reviewed following labs and imaging studies  CBC: Recent Labs  Lab 06/18/18 1211 06/19/18 0522 06/20/18 0404 06/21/18 0529  WBC 6.1 5.2 4.4 5.5  HGB 13.8 13.5 12.6* 13.8  HCT 43.3 42.8 38.6* 42.1  MCV 106.4* 105.4* 104.6* 104.2*   PLT 155 139* 115* 195*   Basic Metabolic Panel: Recent Labs  Lab 06/18/18 1211 06/19/18 0522 06/20/18 0404 06/21/18 0529  NA 142 141 142 140  K 4.4 3.9 3.9 3.6  CL 100 97* 98 94*  CO2 32 33* 34* 34*  GLUCOSE 111* 103* 96 104*  BUN 27* 26* 24* 23  CREATININE 1.11 1.14 1.06 1.22  CALCIUM 9.2 9.3 8.9 9.2  MG  --   --  2.0 1.9   GFR: Estimated Creatinine Clearance: 63 mL/min (by C-G formula based on SCr of 1.22 mg/dL). Liver Function Tests: Recent Labs  Lab 06/18/18 1211 06/20/18 0404  AST 42* 33  ALT 22 19  ALKPHOS 47 41  BILITOT 3.0* 2.8*  PROT 6.8 5.6*  ALBUMIN 3.3* 2.8*   No results for input(s): LIPASE, AMYLASE in the last 168 hours. Recent Labs  Lab 06/18/18 1722  AMMONIA 25   Coagulation Profile: Recent Labs  Lab 06/18/18 1211  INR 2.07   Cardiac Enzymes: No results for input(s): CKTOTAL, CKMB, CKMBINDEX, TROPONINI in the last 168 hours. BNP (last 3 results) Recent Labs    06/05/18 1356 06/11/18 1644  PROBNP 116.0* 81.0   HbA1C: No results for input(s): HGBA1C in the last 72 hours. CBG: No results for input(s): GLUCAP in the last 168 hours. Lipid Profile: No results for input(s): CHOL, HDL, LDLCALC, TRIG, CHOLHDL, LDLDIRECT in the last 72 hours. Thyroid Function Tests: No results for input(s): TSH, T4TOTAL, FREET4, T3FREE, THYROIDAB in the last 72 hours. Anemia Panel: Recent Labs    06/19/18 0759  VITAMINB12 794  RETICCTPCT 1.5   Sepsis Labs: No results for input(s): PROCALCITON, LATICACIDVEN in the last 168 hours.  Recent Results (from the past 240 hour(s))  Gram stain     Status: None   Collection Time: 06/19/18 10:26 AM  Result Value Ref Range Status   Specimen Description PERITONEAL  Final   Special Requests NONE  Final   Gram Stain   Final    FEW WBC PRESENT, PREDOMINANTLY MONONUCLEAR NO ORGANISMS SEEN Performed at [facility], [address], [place], [place] [address]    Report Status 06/19/2018 FINAL  Final   Culture, body fluid-bottle     Status: None (Preliminary result)   Collection Time: 06/19/18 10:26 AM  Result Value Ref Range Status   Specimen Description PERITONEAL  Final   Special Requests NONE  Final   Culture NO GROWTH 2 DAYS  Final   Report Status PENDING  Incomplete         Radiology Studies: Ir Paracentesis  Result Date: 06/19/2018 INDICATION: Patient with history of ascites, abdominal distension. Request is made for diagnostic and therapeutic paracentesis. EXAM: ULTRASOUND GUIDED DIAGNOSTIC AND THERAPEUTIC PARACENTESIS MEDICATIONS: 10 mL 1% lidocaine COMPLICATIONS: None immediate. PROCEDURE: Informed written consent was obtained from the patient after a discussion of the risks, benefits and alternatives to treatment. A timeout was performed prior to the initiation of the procedure. Initial ultrasound scanning demonstrates a moderate amount of ascites within the right lower abdominal quadrant. The right lower abdomen was prepped and draped in the usual sterile fashion. 1% lidocaine was used for local anesthesia. Following this, a 6 Fr Safe-T-Centesis catheter was introduced. An ultrasound image was saved for documentation purposes. The paracentesis  was performed. The catheter was removed and a dressing was applied. The patient tolerated the procedure well without immediate post procedural complication. FINDINGS: A total of approximately 2.5 L of hazy, pink fluid was removed. Samples were sent to the laboratory as requested by the clinical team. IMPRESSION: Successful ultrasound-guided diagnostic and therapeutic paracentesis yielding 2.5 liters of peritoneal fluid. Read by: Brynda Greathouse PA-C Electronically Signed   By: Lucrezia Europe M.D.   On: 06/19/2018 10:48        Scheduled Meds: . apixaban  5 mg Oral BID  . cromolyn  2 drop Both Eyes BH-q7a  . furosemide  80 mg Intravenous Q8H  . metoprolol succinate  37.5 mg Oral Daily  . spironolactone  50 mg Oral BID   Continuous  Infusions: . albumin human       LOS: 3 days    Time spent: [place], MD [facility]  If 7PM-7AM, please contact night-coverage [email or web address] Password [facility] 06/21/2018, 9:50 AM

## 2018-06-21 NOTE — Progress Notes (Signed)
Called by RN- pt's HR in the 40's with vague fatigue.  I cut his Toprol back to 25 mg daily.  Kerin Ransom PA-C 06/21/2018 11:18 AM

## 2018-06-21 NOTE — Progress Notes (Signed)
Pt c/o feeling foggy head and sleepy, bp stable, hr in 40's, no other changes per CCMD, paged cardiology provider to advise

## 2018-06-21 NOTE — Consult Note (Signed)
Referring Provider: Dr. Herbert Moors Primary Care Physician:  Tonia Ghent, MD Primary Gastroenterologist:  Dr. Cristina Gong  Reason for Consultation:  Cirrhosis  HPI: Richard Saunders is a 78 y.o. male with Afib on Eliquis, history of rheumatic heart disease, CHF recently diagnosed with cirrhosis. He was admitted with shortness of breath and had abdominal distention that had been present for over a month as well as LE edema. He was found to have ascites and 2.5 L was removed on a paracentesis that was lympocytic-predominant. His SOB has resolved with diuresis and paracentesis. Rare alcohol use. Hepatitis panel negative. U/S consistent with cirrhosis with ascites and splenomegaly. No focal liver lesions seen on U/S. Denies NSAIDs. Colonoscopy by Dr. Cristina Gong in 2010 showed left-sided diverticulosis.  Past Medical History:  Diagnosis Date  . Allergy   . Anxiety   . Aortic insufficiency    a. 04/2018 Echo: EF 55-60%. Mild to Mod MR. Mild AI/TR.  Marland Kitchen Atrial fibrillation ([facility])    a. Dx 04/2018. CHA2DS2VASc = 4-->Eliquis.  . Depression   . H/O: rheumatic fever 2012   (per patient) - eval by [facility] LBBB with PVC's  . History of chicken pox   . Hypertension   . LBBB (left bundle branch block)   . Organic impotence   . PVC's (premature ventricular contractions)     Past Surgical History:  Procedure Laterality Date  . Cautery for nosebleeds    . IR PARACENTESIS  06/19/2018  . Right elbow bursa removed    . TONSILLECTOMY AND ADENOIDECTOMY  1947    Prior to Admission medications   Medication Sig Start Date End Date Taking? Authorizing Provider  acetaminophen (TYLENOL) 500 MG tablet Take 500 mg by mouth every 6 (six) hours as needed for headache (pain).   Yes [provider]  ALPRAZolam (XANAX) 0.25 MG tablet Take 0.5 tablets (0.125 mg total) by mouth daily as needed for anxiety. 01/27/18  Yes Tonia Ghent, MD  apixaban (ELIQUIS) 5 MG TABS tablet Take 1 tablet (5 mg total) by mouth 2  (two) times daily. 06/02/18 27-Jun-2019 Yes Fenton, Clint R, PA  cromolyn (OPTICROM) 4 % ophthalmic solution Place 2 drops into both eyes every morning.  05/31/15  Yes [provider]  furosemide (LASIX) 20 MG tablet Take 2 tablets in the AM and 1 tablet in the PM Patient taking differently: Take 40 mg by mouth See admin instructions. Take 40 mg  tablets in the AM and 20 mg  tablet in the PM 06/16/18  Yes Fenton, Clint R, PA  metoprolol succinate (TOPROL-XL) 25 MG 24 hr tablet Take 1.5 tablets (37.5 mg total) by mouth daily. 06/02/18 06-27-2019 Yes Fenton, Clint R, PA  polyethylene glycol powder (GLYCOLAX/MIRALAX) powder Take 17 g by mouth 2 (two) times daily as needed. 01/27/18  Yes Tonia Ghent, MD  spironolactone (ALDACTONE) 25 MG tablet Take 0.5 tablets (12.5 mg total) by mouth daily. 06/12/18  Yes Tonia Ghent, MD    Scheduled Meds: . apixaban  5 mg Oral BID  . cromolyn  2 drop Both Eyes BH-q7a  . furosemide  80 mg Intravenous Q8H  . [START ON 06/22/2018] metoprolol succinate  25 mg Oral Daily  . spironolactone  50 mg Oral BID   Continuous Infusions: . albumin human     PRN Meds:.albumin human, ALPRAZolam, lidocaine, polyethylene glycol  Allergies as of 06/18/2018 - Review Complete 06/18/2018  Allergen Reaction Noted  . Colchicine Diarrhea 08/04/2012  . Fluticasone propionate Other (See Comments)  06/07/2010  . Nasacort [triamcinolone] Other (See Comments) 05/28/2018    Family History  Problem Relation Age of Onset  . Cancer Mother        ovarian, uterine  . Aneurysm Mother        AAA in 37  . Cancer Father        Prostate  . COPD Father   . Prostate cancer Father   . Diabetes Other   . Heart disease Other        CAD <60 male  . Hypertension Other   . Heart disease Maternal Grandfather        died from possible "heart attack" in early 36's  . Heart failure Maternal Uncle   . Colon cancer Neg Hx     Social History   Socioeconomic History  . Marital status:  Single    Spouse name: Not on file  . Number of children: Not on file  . Years of education: Not on file  . Highest education level: Not on file  Occupational History  . 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 Financial resource strain: Not on file  . Food insecurity:    Worry: Not on file    Inability: Not on file  . Transportation needs:    Medical: Not on file    Non-medical: Not on file  Tobacco Use  . Smoking status: Former Smoker    Years: 40.00    Last attempt to quit: 04/29/1978    Years since quitting: 40.1  . Smokeless tobacco: Never Used  Substance and Sexual Activity  . Alcohol use: Yes    Alcohol/week: 0.0 standard drinks    Comment: Rare  . Drug use: No  . Sexual activity: Not on file  Lifestyle  . Physical activity:    Days per week: Not on file    Minutes per session: Not on file  . Stress: Not on file  Relationships  . Social connections:    Talks on phone: Not on file    Gets together: Not on file    Attends religious service: Not on file    Active member of club or organization: Not on file    Attends meetings of clubs or organizations: Not on file    Relationship status: Not on file  . Intimate partner violence:    Fear of current or ex partner: Not on file    Emotionally abused: Not on file    Physically abused: Not on file    Forced sexual activity: Not on file  Other Topics Concern  . 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  Physical Exam: Vital signs: Vitals:   06/21/18 1205 06/21/18 1441  BP:  109/68  Pulse: (!) 52 (!) 51  Resp:    Temp:    SpO2:  93%  T 97.6, R 19  Last BM Date: 06/21/18 General:   Alert,  Well-developed, well-nourished, pleasant and cooperative in NAD Head: normocephalic, atraumatic Eyes: anicteric sclera ENT:  oropharynx clear Neck: supple, nontender Lungs:  Clear throughout to auscultation.   No wheezes, crackles, or rhonchi. No acute distress. Heart:  Regular rate and rhythm; no murmurs, clicks, rubs,  or gallops. Abdomen: minimal abdominal distention, tympanic percussion, nontender, soft, +BS Rectal:  Deferred Ext: 1+ pitting LE edema  GI:  Lab Results: Recent Labs    06/19/18 0522 06/20/18 0404 06/21/18 0529  WBC 5.2 4.4 5.5  HGB 13.5 12.6* 13.8  HCT 42.8 38.6* 42.1  PLT 139* 115* 121*   BMET Recent Labs    06/19/18 0522 06/20/18 0404 06/21/18 0529  NA 141 142 140  K 3.9 3.9 3.6  CL 97* 98 94*  CO2 33* 34* 34*  GLUCOSE 103* 96 104*  BUN 26* 24* 23  CREATININE 1.14 1.06 1.22  CALCIUM 9.3 8.9 9.2   LFT Recent Labs    06/20/18 0404  PROT 5.6*  ALBUMIN 2.8*  AST 33  ALT 19  ALKPHOS 41  BILITOT 2.8*  BILIDIR 0.7*  IBILI 2.1*   PT/INR No results for input(s): LABPROT, INR in the last 72 hours.   Studies/Results: No results found.  Impression/Plan: 78 yo with newly diagnosed cirrhosis with decompensation with ascites. No signs of GI bleeding. 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 Paracentesis negative for spontaneous bacterial peritonitis. Start Rifaximin and Lactulose to try and prevent hepatic encephalopathy. Unclear what his PO dose of diuretics will need to be at this time since he is still being actively diuresed with IV Lasix but when he is close to discharge will need to remain on a combination of Lasix and Spironolactone. Will need an outpt EGD to screen for varices. Will need to follow a low sodium diet (less than 2 grams per day). Dr. Therisa Doyne will f/u tomorrow.    LOS: 3 days   [name]  06/21/2018, 4:42 PM  Questions please call [phone]

## 2018-06-22 LAB — FOLATE RBC
Folate, Hemolysate: 416 ng/mL
Folate, RBC: 1159 ng/mL (ref >498)
Hematocrit: 35.9 % — ABNORMAL LOW (ref 37.5–51.0)

## 2018-06-22 LAB — BASIC METABOLIC PANEL
Anion gap: 10 (ref 5–15)
BUN: 22 mg/dL (ref 8–23)
CO2: 36 mmol/L — ABNORMAL HIGH (ref 22–32)
Calcium: 9.6 mg/dL (ref 8.9–10.3)
Chloride: 94 mmol/L — ABNORMAL LOW (ref 98–111)
Creatinine, Ser: 1.2 mg/dL (ref 0.61–1.24)
GFR calc Af Amer: >60 mL/min (ref >60)
GFR calc non Af Amer: 58 mL/min — ABNORMAL LOW (ref >60)
Glucose, Bld: 98 mg/dL (ref 70–99)
Potassium: 3.5 mmol/L (ref 3.5–5.1)

## 2018-06-22 LAB — IRON AND TIBC
Iron: 76 ug/dL (ref 45–182)
Saturation Ratios: 32 % (ref 17.9–39.5)
TIBC: 235 ug/dL — ABNORMAL LOW (ref 250–450)
UIBC: 159 ug/dL

## 2018-06-22 LAB — BASIC METABOLIC PANEL WITH GFR: Sodium: 140 mmol/L (ref 135–145)

## 2018-06-22 LAB — FERRITIN: FERRITIN: 176 ng/mL (ref 24–336)

## 2018-06-22 MED ORDER — ENSURE ENLIVE PO LIQD
237.0000 mL | Freq: Two times a day (BID) | ORAL | Status: DC
  Administered 2018-06-23 (×2): 237 mL via ORAL

## 2018-06-22 MED ORDER — METOPROLOL SUCCINATE ER 25 MG PO TB24
12.5000 mg | ORAL_TABLET | Freq: Every day | ORAL | Status: DC
  Administered 2018-06-22 – 2018-06-23 (×2): 12.5 mg via ORAL
  Filled 2018-06-22 (×2): qty 1

## 2018-06-22 NOTE — Progress Notes (Signed)
Initial Nutrition Assessment  DOCUMENTATION CODES:   Non-severe (moderate) malnutrition in context of chronic illness  INTERVENTION:  Education, "Low Sodium Nutrition Therapy and Sodium-free Flavoring Tips" handouts were given to Pt.  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 Pt now admitted for SOB, volume overload, and Acute on Chronic Diastolic CHF.    1/61 Paracentesis with 2.5L of Fluid removed.    Pt states that his appetite hasn't been good/declining for several months now, Stating "I eat because I know I have to eat". 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 Pt states he was never a big breakfast person. For lunch he typically eats a frozen meal such as Stouffers chicken linguini or Alfredo. For dinner either another frozen meal or goes out to a restaurant with country cooking, noting a country cooking buffet place near him or pizza king. Pt also states if he cooks for himself its typically carrots, potatoes, or some easy meat like pulled pork.   Pt current weight is 215.7# (97.8kg) and states while he doesn't usually weigh himself his usual  weight per doctors visits was 230-235# at max being 240# at one time with no known weight loss. PT is aware of current excess fluid contributing to current weight.   Pt was Educated on and given AND's Nutrition Care Manual "Low Sodium Nutrition Therapy and Sodium-free Flavoring Tips" handouts were given to Pt. Highlighted 2g or less a day sodium. Covered low sodium foods contained 148m or less per serving of sodium. Also highlighted foods (frozen meals) in current diet that may be contributing to sodium intake. Teach back method used, pt stated 2g or less a day and noted serving size as important as well as fresh vs. Processed was better for sodium content. Also noted that he will weigh himself daily ti account for fluid buildup.   Pt expected compliance is good. Pt seemed highly motivated by already removing salt shaker from table as to not salt foods when eating. Pt is on heart healthy Diet. Pt meal completion is 75-100%. Also discussed with RN, pt was consuming meals and aware of pt motivation to lower sodium.   Labs reviewed.   Medications reviewed and include:  Lasix  Lactulose Aldactone K+ sparing diuretic Albumin, Human 25% solution 25g (given with Paracentesis).   I/O: Since Admit -8.1L   NUTRITION - FOCUSED PHYSICAL EXAM:    Most Recent Value  Orbital Region  Mild depletion  Upper Arm Region  Mild depletion  Thoracic and Lumbar Region  Unable to assess [Edema, Fluid Overload]  Buccal Region  No depletion  Temple Region  Moderate depletion  Clavicle Bone Region  Moderate depletion  Scapular Bone Region  Moderate depletion  Dorsal Hand  Moderate depletion  Patellar Region  No depletion  Anterior Thigh Region  No depletion  Posterior Calf Region  Unable to assess [Edema, Fluid Overload]  Edema (RD Assessment)  Moderate  Hair  Reviewed  Eyes  Reviewed  Mouth  Reviewed  Skin  Reviewed  Nails  Reviewed       Diet Order:   Diet Order            Diet Heart Room service  appropriate? Yes; Fluid consistency: Thin  Diet effective now              EDUCATION NEEDS:   Education needs have been addressed  Skin:  Skin Assessment: Reviewed RN Assessment  Last BM:  2/23  Height:   Ht Readings from Last 1 Encounters:  06/18/18 6' 1"  (1.854 m)    Weight:   Wt Readings from Last 1 Encounters:  06/22/18 97.8 kg    Ideal Body Weight:  83.6 kg  BMI:  Body mass index is 28.46 kg/m.  Estimated Nutritional Needs:   Kcal:  2200-2500  Protein:  110-125 grams  Fluid:  2.2L - 2.5L     Herma Carson, [place] Dietetic Intern

## 2018-06-22 NOTE — Progress Notes (Addendum)
Subjective: The patient was seen and examined at bedside. He reports improvement in shortness of breath. Last bowel movement yesterday.   Objective: Vital signs in last 24 hours: Temp:  [97.6 F (36.4 C)-98.2 F (36.8 C)] 98.2 F (36.8 C) (02/24 1103) Pulse Rate:  [47-61] 51 (02/24 1103) Resp:  [16-19] 16 (02/24 1103) BP: (98-112)/(62-85) 100/69 (02/24 1103) SpO2:  [92 %-94 %] 92 % (02/24 1103) Weight:  [97.8 kg] 97.8 kg (02/24 0559) Weight change: -2.177 kg Last BM Date: 06/21/18  PE:Not in respiratory distress, able to speak in full sentences GENERAL:no pallor, mild icterus ABDOMEN:distended, presence of shifting dullness, no fluid thrill, normoactive bowel sounds, nontender, abdominal wall edema noted on flanks EXTREMITIES:bipedal pitting edema  Lab Results: Results for orders placed or performed during the hospital encounter of 06/18/18 (from the past 48 hour(s))  Basic metabolic panel     Status: Abnormal   Collection Time: 06/21/18  5:29 AM  Result Value Ref Range   Sodium 140 135 - 145 mmol/L   Potassium 3.6 3.5 - 5.1 mmol/L   Chloride 94 (L) 98 - 111 mmol/L   CO2 34 (H) 22 - 32 mmol/L   Glucose, Bld 104 (H) 70 - 99 mg/dL   BUN 23 8 - 23 mg/dL   Creatinine, Ser 1.22 0.61 - 1.24 mg/dL   Calcium 9.2 8.9 - 10.3 mg/dL   GFR calc non Af Amer 57 (L) >60 mL/min   GFR calc Af Amer >60 >60 mL/min   Anion gap 12 5 - 15    Comment: Performed at [facility], [address], [place], [place] [address]  CBC     Status: Abnormal   Collection Time: 06/21/18  5:29 AM  Result Value Ref Range   WBC 5.5 4.0 - 10.5 K/uL   RBC 4.04 (L) 4.22 - 5.81 MIL/uL   Hemoglobin 13.8 13.0 - 17.0 g/dL   HCT 42.1 39.0 - 52.0 %   MCV 104.2 (H) 80.0 - 100.0 fL   MCH 34.2 (H) 26.0 - 34.0 pg   MCHC 32.8 30.0 - 36.0 g/dL   RDW 14.4 11.5 - 15.5 %   Platelets 121 (L) 150 - 400 K/uL   nRBC 0.0 0.0 - 0.2 %    Comment: Performed at [facility], [place] [address] [place], [place] [address]   Magnesium     Status: None   Collection Time: 06/21/18  5:29 AM  Result Value Ref Range   Magnesium 1.9 1.7 - 2.4 mg/dL    Comment: Performed at [facility] [address], [place], [place] [address]  Basic metabolic panel     Status: Abnormal   Collection Time: 06/22/18  4:58 AM  Result Value Ref Range   Sodium 140 135 - 145 mmol/L   Potassium 3.5 3.5 - 5.1 mmol/L   Chloride 94 (L) 98 - 111 mmol/L   CO2 36 (H) 22 - 32 mmol/L   Glucose, Bld 98 70 - 99 mg/dL   BUN 22 8 - 23 mg/dL   Creatinine, Ser 1.20 0.61 - 1.24 mg/dL   Calcium 9.6 8.9 - 10.3 mg/dL   GFR calc non Af Amer 58 (L) >60 mL/min   GFR calc Af Amer >60 >60 mL/min   Anion gap 10 5 - 15    Comment: Performed at [facility], [place] [address], [place], [place] [address]    Studies/Results: No results found.  Medications: I have reviewed the patient's current  medications.  Assessment: Newly diagnosed cirrhosis,noted on ultrasound from 06/17/2018(likely [facility] related-HbsAG and HCV Ab negative)  MELDNa of 20 (on presentation on 06/18/2018)  Features of decompensation including ascites, status post 2.5 L paracentesis, no evidence of SBP Normal direction of portal vein, patent portal vein  Moderate left-sided pleural effusion( hepatic hydrothorax less likely on that side)  No frank signs of hepatic encephalopathy/asterixis-Started on lactulose 20 g twice a day and Xifaxan 550 mg twice a day  Needs an EGD for variceal screening  History of atrial fibrillation, on Eliquis  History of rheumatic heart disease, CHF  Plan: Mild decrease in GFR to 58, mild elevation of serum creatinine to 1.2, with current dosage of diuretics(furosemide 80 mg IV every 8 hours and spironolactone 50 mg by mouth twice a day). 2500 urine output in the last 24 hours, weight 97.841 kg today 100.018 kg on 06/21/2018,  102.15 kg on 06/20/2018, 105.507 kg on 06/19/2018 104.327 kg on admission on 06/18/2018  Will need to be careful with  use of high-dose furosemide,will likely need to scale down to 40 mg twice a day tomorrow if GFR continues to be reduced,while continuing spironolactone 100 mg a day. Recommend continuing monitoring of daily weight and I/O. Discussed with patient regarding continuing the low-sodium diet. As an outpatient will need an EGD for variceal screening and a screening colonoscopy( has been 10 years since last one).  Will send labs for ceruloplasmin, alpha 1 antitrypsin,iron panel, ASMA and AMA for complete workup for chronic liver disease.    Ronnette Juniper 06/22/2018, 11:30 AM   Pager [phone] If no answer or after 5 PM call [phone]

## 2018-06-22 NOTE — Progress Notes (Signed)
PROGRESS NOTE    Richard Saunders  ZOX:[id number] DOB: 1941-04-17 DOA: 06/18/2018 PCP: Tonia Ghent, MD   Brief Narrative:  78 year old with past medical history relevant for rheumatic heart disease with moderate MR, relatively recent diagnosis of atrial fibrillation/flutter on apixaban, chronic diastolic heart failure, recent diagnosis of cirrhosis with ascites the emergency department with increasing dyspnea, lower extremity edema, shortness of breath with exertion, increasing girth and was felt to have heart failure secondary to atrial fibrillation with plan for cardioversion.  Currently patient is being treated for fluid overload likely secondary to decompensated cirrhosis.   Assessment & Plan:   Active Problems:   Atrial fibrillation (HCC)   Acute on chronic diastolic CHF (congestive heart failure) (HCC)   Ascites   Shortness of breath   #) Edema/dyspnea on exertion/shortness of breath: Likely secondary more to cirrhosis then atrial fibrillation with RVR. -Diuresis per below  #) Decompensated cirrhosis: Patient is status post paracentesis with 2.5 L of fluid removed on 06/19/2018.  He is not had much in the way of diuresis with 40 mg of IV furosemide -Continue furosemide to 80 mg 3 times daily -Continue spironolactone to 50 mg twice daily -We will need outpatient EGD for varices, ultrasound every 6 months for cancer screening -Acute hepatitis panel negative -Paracentesis culture no growth to date paracentesis fluid showing 725 white blood cell counts predominantly lymphocytic and monocytic predominance -[facility] consult  #) Left-sided pleural effusion: Pending thoracentesis per IR  #) Atrial fibrillation/flutter: Patient initially was planned for admission for cardioversion however he is spontaneously converted.  Patient is having bradycardia and significant fatigue. -Continue apixaban 5 mg twice daily - Continue metoprolol succinate 12.5 mg daily  #)  Pain/psych: -Continue PRN alprazolam  Fluids: Restricted Electrolytes: Monitor and supplement Nutrition: Heart healthy diet  Prophylaxis: On apixaban  Disposition: Pending diuresis  Full code    Consultants:   Cardiology  Interventional radiology  [facility]  Procedures:   Paracentesis 06/19/2018 2.5 L removed  Antimicrobials:   None   Subjective: This morning patient reports he is feeling tired.  He denies any chest pain, bone pain, nausea, vomiting, diarrhea, cough, congestion, rhinorrhea.  Objective: Vitals:   06/21/18 1205 06/21/18 1441 06/21/18 1921 06/22/18 0559  BP:  109/68 99/63 112/62  Pulse: (!) 52 (!) 51 61 (!) 49  Resp:   18 18  Temp:   98 F (36.7 C) 97.6 F (36.4 C)  TempSrc:   Oral Oral  SpO2:  93% 94% 92%  Weight:    97.8 kg  Height:        Intake/Output Summary (Last 24 hours) at 06/22/2018 0956 Last data filed at 06/22/2018 0530 Gross per 24 hour  Intake 720 ml  Output 2170 ml  Net -1450 ml   Filed Weights   06/20/18 0455 06/21/18 0452 06/22/18 0559  Weight: 102.2 kg 100 kg 97.8 kg    Examination:  General exam: Appears calm and comfortable  Respiratory system: Clear to auscultation.  No increased work of breathing, diminished lung sounds at bases particularly on left, no wheezes, crackles, rhonchi, Rales Cardiovascular system: Regularly regular, no murmurs Gastrointestinal system: Soft, nontender, no rebound or guarding, minimal shifting dullness, diminished from prior Central nervous system: Alert and oriented.  Grossly intact, moving all extremities Extremities: 1+ lower extremity edema Skin: No rashes over visible skin Psychiatry: Judgement and insight appear normal. Mood & affect appropriate.     Data Reviewed: I have personally reviewed following labs and imaging studies  CBC: Recent Labs  Lab 06/18/18 1211 06/19/18 0522 06/20/18 0404 06/21/18 0529  WBC 6.1 5.2 4.4 5.5  HGB 13.8 13.5 12.6* 13.8  HCT 43.3 42.8  38.6* 42.1  MCV 106.4* 105.4* 104.6* 104.2*  PLT 155 139* 115* 878*   Basic Metabolic Panel: Recent Labs  Lab 06/18/18 1211 06/19/18 0522 06/20/18 0404 06/21/18 0529 06/22/18 0458  NA 142 141 142 140 140  K 4.4 3.9 3.9 3.6 3.5  CL 100 97* 98 94* 94*  CO2 32 33* 34* 34* 36*  GLUCOSE 111* 103* 96 104* 98  BUN 27* 26* 24* 23 22  CREATININE 1.11 1.14 1.06 1.22 1.20  CALCIUM 9.2 9.3 8.9 9.2 9.6  MG  --   --  2.0 1.9  --    GFR: Estimated Creatinine Clearance: 63.5 mL/min (by C-G formula based on SCr of 1.2 mg/dL). Liver Function Tests: Recent Labs  Lab 06/18/18 1211 06/20/18 0404  AST 42* 33  ALT 22 19  ALKPHOS 47 41  BILITOT 3.0* 2.8*  PROT 6.8 5.6*  ALBUMIN 3.3* 2.8*   No results for input(s): LIPASE, AMYLASE in the last 168 hours. Recent Labs  Lab 06/18/18 1722  AMMONIA 25   Coagulation Profile: Recent Labs  Lab 06/18/18 1211  INR 2.07   Cardiac Enzymes: No results for input(s): CKTOTAL, CKMB, CKMBINDEX, TROPONINI in the last 168 hours. BNP (last 3 results) Recent Labs    06/05/18 1356 06/11/18 1644  PROBNP 116.0* 81.0   HbA1C: No results for input(s): HGBA1C in the last 72 hours. CBG: No results for input(s): GLUCAP in the last 168 hours. Lipid Profile: No results for input(s): CHOL, HDL, LDLCALC, TRIG, CHOLHDL, LDLDIRECT in the last 72 hours. Thyroid Function Tests: No results for input(s): TSH, T4TOTAL, FREET4, T3FREE, THYROIDAB in the last 72 hours. Anemia Panel: No results for input(s): VITAMINB12, FOLATE, FERRITIN, TIBC, IRON, RETICCTPCT in the last 72 hours. Sepsis Labs: No results for input(s): PROCALCITON, LATICACIDVEN in the last 168 hours.  Recent Results (from the past 240 hour(s))  Gram stain     Status: None   Collection Time: 06/19/18 10:26 AM  Result Value Ref Range Status   Specimen Description PERITONEAL  Final   Special Requests NONE  Final   Gram Stain   Final    FEW WBC PRESENT, PREDOMINANTLY MONONUCLEAR NO ORGANISMS  SEEN Performed at [facility], [address], [place], [place] [address]    Report Status 06/19/2018 FINAL  Final  Culture, body fluid-bottle     Status: None (Preliminary result)   Collection Time: 06/19/18 10:26 AM  Result Value Ref Range Status   Specimen Description PERITONEAL  Final   Special Requests NONE  Final   Culture NO GROWTH 2 DAYS  Final   Report Status PENDING  Incomplete         Radiology Studies: No results found.      Scheduled Meds: . apixaban  5 mg Oral BID  . cromolyn  2 drop Both Eyes [id number]  . furosemide  80 mg Intravenous Q8H  . lactulose  20 g Oral BID  . metoprolol succinate  12.5 mg Oral Daily  . rifaximin  550 mg Oral BID  . spironolactone  50 mg Oral BID   Continuous Infusions: . albumin human       LOS: 4 days    Time spent: [place], MD [facility]  If 7PM-7AM, please contact night-coverage [email or web address] Password [facility] 06/22/2018, 9:56  AM

## 2018-06-22 NOTE — Progress Notes (Addendum)
CCMD called to report 14 beats of vt, pt resting in bed, denies cp sob or palps, he reports just feeling tired today sb on telemetry now, bp 111/60, paged Dr Herbert Moors, labs ordered for am and will have cardiology see him again as they signed off

## 2018-06-22 NOTE — Progress Notes (Addendum)
CCMD called to report 13 beats of VT, pt was in bathroom and said he had to , denies cp, sob or dizziness, vss, pt assisted back to bed and stable, paged Dr Alphia Kava

## 2018-06-23 LAB — BASIC METABOLIC PANEL
Anion gap: 13 (ref 5–15)
BUN: 24 mg/dL — ABNORMAL HIGH (ref 8–23)
CO2: 35 mmol/L — ABNORMAL HIGH (ref 22–32)
Calcium: 9.6 mg/dL (ref 8.9–10.3)
Chloride: 93 mmol/L — ABNORMAL LOW (ref 98–111)
Creatinine, Ser: 1.2 mg/dL (ref 0.61–1.24)
GFR calc Af Amer: >60 mL/min (ref >60)
GFR calc non Af Amer: 58 mL/min — ABNORMAL LOW (ref >60)
Glucose, Bld: 108 mg/dL — ABNORMAL HIGH (ref 70–99)
Potassium: 3.6 mmol/L (ref 3.5–5.1)
Sodium: 141 mmol/L (ref 135–145)

## 2018-06-23 LAB — MAGNESIUM: Magnesium: 1.9 mg/dL (ref 1.7–2.4)

## 2018-06-23 LAB — MITOCHONDRIAL ANTIBODIES: Mitochondrial M2 Ab, IgG: <20 Units (ref 0.0–20.0)

## 2018-06-23 LAB — ANTINUCLEAR ANTIBODIES, IFA: ANA Ab, IFA: NEGATIVE

## 2018-06-23 LAB — ANTI-SMOOTH MUSCLE ANTIBODY, IGG: F-ACTIN AB IGG: 18 U (ref 0–19)

## 2018-06-23 LAB — CERULOPLASMIN: Ceruloplasmin: 26.3 mg/dL (ref 16.0–31.0)

## 2018-06-23 LAB — ALPHA-1-ANTITRYPSIN: A-1 Antitrypsin, Ser: 156 mg/dL (ref 101–187)

## 2018-06-23 MED ORDER — METOPROLOL SUCCINATE ER 25 MG PO TB24: 12.5000 mg | ORAL_TABLET | Freq: Every day | ORAL | 0 refills | Status: DC

## 2018-06-23 MED ORDER — RIFAXIMIN 550 MG PO TABS: 550.0000 mg | ORAL_TABLET | Freq: Two times a day (BID) | ORAL | 0 refills | Status: AC

## 2018-06-23 MED ORDER — OXYCODONE HCL 5 MG PO TABS
5.0000 mg | ORAL_TABLET | Freq: Once | ORAL | Status: AC
  Administered 2018-06-23: 5 mg via ORAL
  Filled 2018-06-23: qty 1

## 2018-06-23 MED ORDER — POTASSIUM CHLORIDE CRYS ER 20 MEQ PO TBCR
40.0000 meq | EXTENDED_RELEASE_TABLET | Freq: Once | ORAL | Status: AC
  Administered 2018-06-23: 40 meq via ORAL
  Filled 2018-06-23: qty 2

## 2018-06-23 MED ORDER — FUROSEMIDE 40 MG PO TABS: 40.0000 mg | ORAL_TABLET | Freq: Two times a day (BID) | ORAL | 0 refills | Status: DC

## 2018-06-23 MED ORDER — FUROSEMIDE 40 MG PO TABS: 40.0000 mg | ORAL_TABLET | Freq: Two times a day (BID) | ORAL | Status: DC

## 2018-06-23 MED ORDER — LACTULOSE 10 GM/15ML PO SOLN: 20.0000 g | Freq: Two times a day (BID) | ORAL | 0 refills | Status: AC

## 2018-06-23 MED ORDER — SPIRONOLACTONE 50 MG PO TABS: 50.0000 mg | ORAL_TABLET | Freq: Two times a day (BID) | ORAL | 0 refills | Status: DC

## 2018-06-23 NOTE — Consult Note (Signed)
   [facility] CM Inpatient Consult   06/23/2018  [name] 06-13-1940 [id number]  Patient screened for a 30 day readmission.  Patient is a 78 year old with past medical history relevant for rheumatic heart disease with moderate MR, relatively recent diagnosis of atrial fibrillation/flutter on apixaban, chronic diastolic heart failure, recent diagnosis of cirrhosis with ascites the emergency department with increasing dyspnea, lower extremity edema, shortness of breath with exertion, increasing girth and was felt to have heart failure secondary to atrial fibrillation.  Patient with [facility]. Met with the patient at the bedside for introducing him to [facility] services for post hospital needs, Primary Care Provider is  Dr. Elsie Stain and this office provides the transition of care needs. Patient states he will be on new medications and he feels he will be alright with learning them.   Pharmacy is:  [facility] Patient accepted a brochure with 24 hour nurse advise line magnet information.  Denies issues with transportation. Agrees to post hospital General EMMI calls.  For questions contact:   Natividad Brood, RN BSN [facility] Liaison  [phone] business mobile phone Toll free office [phone]

## 2018-06-23 NOTE — Progress Notes (Signed)
Subjective: The patient was seen and examined at bedside. He reports feeling "sedated" although he had a good sleep last night and is wondering if it related to any of his medications. He had 2 Bms yesterday.Reports decrease in abdominal and leg swelling.  Objective: Vital signs in last 24 hours: Temp:  [97.6 F (36.4 C)-98.5 F (36.9 C)] 97.6 F (36.4 C) (02/25 0857) Pulse Rate:  [51-104] 62 (02/25 0857) Resp:  [16-20] 20 (02/25 0857) BP: (96-112)/(49-80) 96/80 (02/25 0857) SpO2:  [92 %-95 %] 95 % (02/25 0857) Weight:  [97.2 kg] 97.2 kg (02/25 0431) Weight change: -0.635 kg Last BM Date: 06/22/18  PE:not in respiratory distress, able to speak in full sentences GENERAL:mild icterus, no pallor ABDOMEN:decrease in abdominal distention, mild abdominal wall pitting edema, normoactive bowel sounds EXTREMITIES:improvement in bipedal pitting edema-now limited to ankles and feet  Lab Results: Results for orders placed or performed during the hospital encounter of 06/18/18 (from the past 48 hour(s))  Basic metabolic panel     Status: Abnormal   Collection Time: 06/22/18  4:58 AM  Result Value Ref Range   Sodium 140 135 - 145 mmol/L   Potassium 3.5 3.5 - 5.1 mmol/L   Chloride 94 (L) 98 - 111 mmol/L   CO2 36 (H) 22 - 32 mmol/L   Glucose, Bld 98 70 - 99 mg/dL   BUN 22 8 - 23 mg/dL   Creatinine, Ser 1.20 0.61 - 1.24 mg/dL   Calcium 9.6 8.9 - 10.3 mg/dL   GFR calc non Af Amer 58 (L) >60 mL/min   GFR calc Af Amer >60 >60 mL/min   Anion gap 10 5 - 15    Comment: Performed at [facility], [address], [place], [place] [address]  Iron and TIBC     Status: Abnormal   Collection Time: 06/22/18 12:14 PM  Result Value Ref Range   Iron 76 45 - 182 ug/dL   TIBC 235 (L) 250 - 450 ug/dL   Saturation Ratios 32 17.9 - 39.5 %   UIBC 159 ug/dL    Comment: Performed at [facility] [address], [place], [place] [address]  Ferritin     Status: None   Collection Time: 06/22/18  12:14 PM  Result Value Ref Range   Ferritin 176 24 - 336 ng/mL    Comment: Performed at [facility] [address], [place], [place] [address]  Ceruloplasmin     Status: None   Collection Time: 06/22/18 12:14 PM  Result Value Ref Range   Ceruloplasmin 26.3 16.0 - 31.0 mg/dL    Comment: (NOTE) Performed At: [facility] [place], [place] [id number] Rush Farmer MD [id number]   Alpha-1-antitrypsin     Status: None   Collection Time: 06/22/18 12:14 PM  Result Value Ref Range   A-1 Antitrypsin, Ser 156 101 - 187 mg/dL    Comment: (NOTE) Performed At: [facility] [place], [place] [id number] Rush Farmer MD [id number]   Magnesium     Status: None   Collection Time: 06/23/18  4:22 AM  Result Value Ref Range   Magnesium 1.9 1.7 - 2.4 mg/dL    Comment: Performed at [facility] [address], [place],  [address]  Basic metabolic panel     Status: Abnormal   Collection Time: 06/23/18  4:22 AM  Result Value Ref Range   Sodium 141 135 - 145 mmol/L   Potassium 3.6 3.5 -  5.1 mmol/L   Chloride 93 (L) 98 - 111 mmol/L   CO2 35 (H) 22 - 32 mmol/L   Glucose, Bld 108 (H) 70 - 99 mg/dL   BUN 24 (H) 8 - 23 mg/dL   Creatinine, Ser 1.20 0.61 - 1.24 mg/dL   Calcium 9.6 8.9 - 10.3 mg/dL   GFR calc non Af Amer 58 (L) >60 mL/min   GFR calc Af Amer >60 >60 mL/min   Anion gap 13 5 - 15    Comment: Performed at [facility] [address], [place], [place] [address]    Studies/Results: No results found.  Medications: I have reviewed the patient's current medications.  Assessment: Newly diagnosed cirrhosis(likely related to [facility], normal iron profile, serum plasmin and alpha-1 antitrypsin and chronic hepatitis B and C serologies, ASMA and AMA pending) with decompensation-ascites,no SBP Urine output of 2550 mL in the last 24 hours Renal function stable  History of atrial fibrillation on Eliquis,infrequent V.  Tach reported as per documentation  Plan: Okay to discharge patient on spironolactone 100 mg daily and furosemide 40 mg twice a day Advised to continue lactulose 20 g twice a day along with Xifaxan 550 mg twice a day Patient to follow up with Dr. Cristina Gong in 7-10 days, will need a BMP to monitor renal function as patient has recently been started on diuretics We will set up an outpatient screening colonoscopy and EGD for variceal screening. Discussed the same with the patient as well as patient's hospitalist Dr. Herbert Moors.   Ronnette Juniper 06/23/2018, 11:01 AM   Pager [phone] If no answer or after 5 PM call [phone]

## 2018-06-23 NOTE — Discharge Summary (Signed)
Physician Discharge Summary  Richard Saunders WPV:[id number] DOB: 10-10-1940 DOA: 06/18/2018  PCP: Tonia Ghent, MD  Admit date: 06/18/2018 Discharge date: 06/23/2018  Admitted From: Home Disposition:  Home  Recommendations for Outpatient Follow-up:  1. Follow up with PCP in 1-2 weeks 2. Please obtain CMP/CBC in one week 3. Please follow up on the following pending results: ANA, anti-smooth muscle antibody, antimitochondrial antibody 4. Please weigh yourself every day and write it down  Home Health: No Equipment/Devices: None  Discharge Condition: Stable CODE STATUS: Full Diet recommendation: Heart Healthy /low-sodium  Brief/Interim Summary:  #) Shortness of breath/lower extremity edema: Patient was admitted with shortness of breath and worsening lower extremity edema.  Initially this was attributed to uncontrolled atrial fibrillation versus acute on chronic diastolic heart failure however this was now felt more likely to be secondary to cirrhosis and ascites.  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  Hepatology was consulted, [facility].  The conducted a brief work-up of other causes of chronic liver disease.  Patient had a normal ceruloplasmin and alpha-1 antitrypsin.  A anti-smooth muscle antibody, mitochondrial antibody, ANA is pending.  His iron stores were normal.  Patient received a paracentesis with 2.5 L of fluid removed.  This did not show any evidence of SBP.  Patient was started on spironolactone and given IV furosemide for his edema.  These were uptitrated.  Patient gradually improved in terms of his lower extremity edema.  He was discharged home on oral furosemide 40 mg twice daily and 100 mg spironolactone.  He was started on rifaximin and lactulose for hepatic encephalopathy prophylaxis per GI.  #) Left-sided pleural effusion: Patient was noted to have small  left-sided pleural effusion.  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  #) Paroxysmal atrial fibrillation: Patient initially was admitted by cardiology for plan for cardioversion however this spontaneously resolved and converted.  Patient was started on metoprolol for heart rate control as well as apixaban for anticoagulation however metoprolol had to be decreased due to fatigue and bradycardia.  #) NSVT: On 06/22/2018 patient had a brief run of 14 beat run of NSVT.  Cardiology was notified.  Electrolytes were normal.  Patient will follow-up as an outpatient.  Discharge Diagnoses:  Active Problems:   Atrial fibrillation (HCC)   Acute on chronic diastolic CHF (congestive heart failure) (HCC)   Ascites   Shortness of breath    Discharge Instructions  Discharge Instructions    Amb Referral to Phase II Cardiac  Rehab   Complete by:  As directed    Diagnosis:  Other   Call MD for:  difficulty breathing, headache or visual disturbances   Complete by:  As directed    Call MD for:  extreme fatigue   Complete by:  As directed    Call MD for:  hives   Complete by:  As directed    Call MD for:  persistant dizziness or light-headedness   Complete by:  As directed    Call MD for:  persistant nausea and vomiting   Complete by:  As directed    Call MD for:  redness, tenderness, or signs of infection (pain, swelling, redness, odor or green/yellow discharge around incision site)   Complete by:  As directed    Call MD for:  severe uncontrolled pain   Complete by:  As directed  Call MD for:  temperature >100.4   Complete by:  As directed    Diet - low sodium heart healthy   Complete by:  As directed    Discharge instructions   Complete by:  As directed    Please follow-up with your primary care doctor in 1 week.  Please follow-up with gastroenterology in 1 to 2 weeks.  Please weigh yourself every day and write it down.   Increase  activity slowly   Complete by:  As directed      Allergies as of 06/23/2018      Reactions   Colchicine Diarrhea   Fluticasone Propionate Other (See Comments)   He didn't tolerate the taste/smell prev   Nasacort [triamcinolone] Other (See Comments)   Didn't tolerate      Medication List    STOP taking these medications   polyethylene glycol powder powder Commonly known as:  GLYCOLAX/MIRALAX     TAKE these medications   acetaminophen 500 MG tablet Commonly known as:  TYLENOL Take 500 mg by mouth every 6 (six) hours as needed for headache (pain).   ALPRAZolam 0.25 MG tablet Commonly known as:  XANAX Take 0.5 tablets (0.125 mg total) by mouth daily as needed for anxiety.   apixaban 5 MG Tabs tablet Commonly known as:  ELIQUIS Take 1 tablet (5 mg total) by mouth 2 (two) times daily.   cromolyn 4 % ophthalmic solution Commonly known as:  OPTICROM Place 2 drops into both eyes every morning.   furosemide 40 MG tablet Commonly known as:  LASIX Take 1 tablet (40 mg total) by mouth 2 (two) times daily for 30 days. What changed:    medication strength  how much to take  how to take this  when to take this  additional instructions   lactulose 10 GM/15ML solution Commonly known as:  CHRONULAC Take 30 mLs (20 g total) by mouth 2 (two) times daily for 30 days.   metoprolol succinate 25 MG 24 hr tablet Commonly known as:  TOPROL-XL Take 0.5 tablets (12.5 mg total) by mouth daily. Start taking on:  June 24, 2018 What changed:  how much to take   rifaximin 550 MG Tabs tablet Commonly known as:  XIFAXAN Take 1 tablet (550 mg total) by mouth 2 (two) times daily for 30 days.   spironolactone 50 MG tablet Commonly known as:  ALDACTONE Take 1 tablet (50 mg total) by mouth 2 (two) times daily for 30 days. What changed:    medication strength  how much to take  when to take this      Follow-up Information    Tonia Ghent, MD. Go on 06/30/2018.   Specialty:   Family Medicine Why:  @12 :00pm Contact information: [address]. [place] [address] [phone]          Allergies  Allergen Reactions  . Colchicine Diarrhea  . Fluticasone Propionate Other (See Comments)    He didn't tolerate the taste/smell prev  . Nasacort [Triamcinolone] Other (See Comments)    Didn't tolerate    Consultations:  Cardiology  Interventional radiology  [facility]   Procedures/Studies: Dg Chest 2 View  Result Date: 06/18/2018 CLINICAL DATA:  Shortness of breath, hemoptysis. EXAM: CHEST - 2 VIEW COMPARISON:  Radiographs of June 16, 2018. FINDINGS: Stable cardiomediastinal silhouette. Moderate left pleural effusion is noted with probable underlying atelectasis or infiltrate. No pneumothorax is noted. Right lung is clear. Bony thorax is unremarkable. IMPRESSION: Moderate  left pleural effusion with probable underlying atelectasis or infiltrate. Electronically Signed   By: Marijo Conception, M.D.   On: 06/18/2018 12:37   Dg Chest 2 View  Result Date: 06/16/2018 CLINICAL DATA:  Swelling in the legs and feet EXAM: CHEST - 2 VIEW COMPARISON:  05/28/2018, 12/21/2017 FINDINGS: Left diaphragm is elevated. Small moderate left pleural effusion without significant change. Airspace disease at the lingula and left base. Right lung is clear. Stable cardiomediastinal silhouette. No pneumothorax. IMPRESSION: Elevated left diaphragm with small moderate left pleural effusion and left basilar and lingular atelectasis or pneumonia. Electronically Signed   By: Donavan Foil M.D.   On: 06/16/2018 15:10   US Abdomen Complete  Result Date: 06/17/2018 CLINICAL DATA:  Abdominal bloating. EXAM: ABDOMEN ULTRASOUND COMPLETE COMPARISON:  None. FINDINGS: Gallbladder: No gallstones or wall thickening visualized. No sonographic Murphy sign noted by sonographer. Common bile duct: Diameter: 3 mm which is within normal limits. Liver: Heterogeneous echotexture is  noted with nodular hepatic contours consistent with hepatic cirrhosis. No focal abnormality is noted sonographically. Portal vein is patent on color Doppler imaging with normal direction of blood flow towards the liver. IVC: Not well visualized due to overlying bowel gas and body habitus. Pancreas: Not well visualized due to overlying bowel gas and body habitus. Spleen: Maximum measured diameter of 13 cm with calculated volume of 683 cubic cm consistent with mild splenomegaly. Right Kidney: Length: 9.9 cm. Echogenicity within normal limits. No mass or hydronephrosis visualized. Left Kidney: Length: 10.8 cm. Echogenicity within normal limits. No mass or hydronephrosis visualized. Abdominal aorta: Not visualized due to overlying bowel gas and body habitus. Other findings: Mild ascites is noted. 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 No focal hepatic abnormality is seen sonographically. Electronically Signed   By: Marijo Conception, M.D.   On: 06/17/2018 15:32   Dg Chest Port 1 View  Result Date: 05/28/2018 CLINICAL DATA:  Shortness of breath EXAM: PORTABLE CHEST 1 VIEW COMPARISON:  08/29/2016 FINDINGS: Cardiac shadow is enlarged but stable. Elevation of left hemidiaphragm is noted with small left pleural effusion and likely underlying atelectasis. No bony abnormality is seen. IMPRESSION: Mild left pleural effusion and underlying atelectasis. Electronically Signed   By: Inez Catalina M.D.   On: 05/28/2018 11:30   Ir Paracentesis  Result Date: 06/19/2018 INDICATION: Patient with history of ascites, abdominal distension. Request is made for diagnostic and therapeutic paracentesis. EXAM: ULTRASOUND GUIDED DIAGNOSTIC AND THERAPEUTIC PARACENTESIS MEDICATIONS: 10 mL 1% lidocaine COMPLICATIONS: None immediate. PROCEDURE: Informed written consent was obtained from the patient after a discussion of the risks, benefits and alternatives to treatment. A timeout was performed prior to the initiation  of the procedure. Initial ultrasound scanning demonstrates a moderate amount of ascites within the right lower abdominal quadrant. The right lower abdomen was prepped and draped in the usual sterile fashion. 1% lidocaine was used for local anesthesia. Following this, a 6 Fr Safe-T-Centesis catheter was introduced. An ultrasound image was saved for documentation purposes. The paracentesis was performed. The catheter was removed and a dressing was applied. The patient tolerated the procedure well without immediate post procedural complication. FINDINGS: A total of approximately 2.5 L of hazy, pink fluid was removed. Samples were sent to the laboratory as requested by the clinical team. IMPRESSION: Successful ultrasound-guided diagnostic and therapeutic paracentesis yielding 2.5 liters of peritoneal fluid. Read by: Brynda Greathouse PA-C Electronically Signed   By: Lucrezia Europe M.D.   On: 06/19/2018 10:48    Paracentesis 06/19/2018 2.5  L removed   Subjective:   Discharge Exam: Vitals:   06/23/18 0431 06/23/18 0857  BP: 112/67 96/80  Pulse: (!) 104 62  Resp: 18 20  Temp: 98.2 F (36.8 C) 97.6 F (36.4 C)  SpO2: 94% 95%   Vitals:   06/22/18 1800 06/22/18 1924 06/23/18 0431 06/23/18 0857  BP: 111/60 100/60 112/67 96/80  Pulse: (!) 54 70 (!) 104 62  Resp:  18 18 20   Temp:  98.5 F (36.9 C) 98.2 F (36.8 C) 97.6 F (36.4 C)  TempSrc:  Oral Oral Oral  SpO2:  93% 94% 95%  Weight:   97.2 kg   Height:        General exam: Appears calm and comfortable  Respiratory system: Clear to auscultation.  No increased work of breathing, diminished lung sounds at bases particularly on left, no wheezes, crackles, rhonchi, Rales Cardiovascular system: Regularly regular, no murmurs Gastrointestinal system: Soft, nontender, no rebound or guarding, minimal shifting dullness, diminished from prior Central nervous system: Alert and oriented.  Grossly intact, moving all extremities Extremities: trace+ lower  extremity edema Skin: No rashes over visible skin Psychiatry: Judgement and insight appear normal. Mood & affect appropriate.       The results of significant diagnostics from this hospitalization (including imaging, microbiology, ancillary and laboratory) are listed below for reference.     Microbiology: Recent Results (from the past 240 hour(s))  Gram stain     Status: None   Collection Time: 06/19/18 10:26 AM  Result Value Ref Range Status   Specimen Description PERITONEAL  Final   Special Requests NONE  Final   Gram Stain   Final    FEW WBC PRESENT, PREDOMINANTLY MONONUCLEAR NO ORGANISMS SEEN Performed at [facility], [address] [place], [place] [address]    Report Status 06/19/2018 FINAL  Final  Culture, body fluid-bottle     Status: None (Preliminary result)   Collection Time: 06/19/18 10:26 AM  Result Value Ref Range Status   Specimen Description PERITONEAL  Final   Special Requests NONE  Final   Culture   Final    NO GROWTH 3 DAYS Performed at [facility] [address], [place], [place] [address]    Report Status PENDING  Incomplete     Labs: BNP (last 3 results) Recent Labs    05/28/18 1115  BNP 20.3   Basic Metabolic Panel: Recent Labs  Lab 06/19/18 0522 06/20/18 0404 06/21/18 0529 06/22/18 0458 06/23/18 0422  NA 141 142 140 140 141  K 3.9 3.9 3.6 3.5 3.6  CL 97* 98 94* 94* 93*  CO2 33* 34* 34* 36* 35*  GLUCOSE 103* 96 104* 98 108*  BUN 26* 24* 23 22 24*  CREATININE 1.14 1.06 1.22 1.20 1.20  CALCIUM 9.3 8.9 9.2 9.6 9.6  MG  --  2.0 1.9  --  1.9   Liver Function Tests: Recent Labs  Lab 06/18/18 1211 06/20/18 0404  AST 42* 33  ALT 22 19  ALKPHOS 47 41  BILITOT 3.0* 2.8*  PROT 6.8 5.6*  ALBUMIN 3.3* 2.8*   No results for input(s): LIPASE, AMYLASE in the last 168 hours. Recent Labs  Lab 06/18/18 1722  AMMONIA 25   CBC: Recent Labs  Lab 06/18/18 1211 06/19/18 0522 06/19/18 0759 06/20/18 0404  06/21/18 0529  WBC 6.1 5.2  --  4.4 5.5  HGB 13.8 13.5  --  12.6* 13.8  HCT 43.3 42.8 35.9* 38.6* 42.1  MCV 106.4*  105.4*  --  104.6* 104.2*  PLT 155 139*  --  115* 121*   Cardiac Enzymes: No results for input(s): CKTOTAL, CKMB, CKMBINDEX, TROPONINI in the last 168 hours. BNP: Invalid input(s): POCBNP CBG: No results for input(s): GLUCAP in the last 168 hours. D-Dimer No results for input(s): DDIMER in the last 72 hours. Hgb A1c No results for input(s): HGBA1C in the last 72 hours. Lipid Profile No results for input(s): CHOL, HDL, LDLCALC, TRIG, CHOLHDL, LDLDIRECT in the last 72 hours. Thyroid function studies No results for input(s): TSH, T4TOTAL, T3FREE, THYROIDAB in the last 72 hours.  Invalid input(s): FREET3 Anemia work up Recent Labs    06/22/18 1214  FERRITIN 176  TIBC 235*  IRON 76   Urinalysis    Component Value Date/Time   LABSPEC 1.020 07/25/2011 1403   PHURINE 6.0 07/25/2011 1403   GLUCOSEU NEGATIVE 07/25/2011 1403   [place] 07/25/2011 1403   [place] 07/25/2011 1403   [place] 07/25/2011 1403   PROTEINUR 30 (A) 07/25/2011 1403   UROBILINOGEN 1.0 07/25/2011 1403   NITRITE NEGATIVE 07/25/2011 1403   LEUKOCYTESUR NEGATIVE 07/25/2011 1403   Sepsis Labs Invalid input(s): PROCALCITONIN,  WBC,  LACTICIDVEN Microbiology Recent Results (from the past 240 hour(s))  Gram stain     Status: None   Collection Time: 06/19/18 10:26 AM  Result Value Ref Range Status   Specimen Description PERITONEAL  Final   Special Requests NONE  Final   Gram Stain   Final    FEW WBC PRESENT, PREDOMINANTLY MONONUCLEAR NO ORGANISMS SEEN Performed at [facility], [place] [address] [place], [place] [address]    Report Status 06/19/2018 FINAL  Final  Culture, body fluid-bottle     Status: None (Preliminary result)   Collection Time: 06/19/18 10:26 AM  Result Value Ref Range Status   Specimen Description PERITONEAL  Final   Special Requests  NONE  Final   Culture   Final    NO GROWTH 3 DAYS Performed at [facility] [address] [place],  [address]    Report Status PENDING  Incomplete     Time coordinating discharge: 82  SIGNED:   Cristy Folks, MD  [facility] 06/23/2018, 11:53 AM  If 7PM-7AM, please contact night-coverage [email or web address] Password [email or web address]

## 2018-06-23 NOTE — Care Management Important Message (Signed)
Important Message  Patient Details  Name: Richard Saunders MRN: [id number] Date of Birth: [birth year]   Medicare Important Message Given:  Yes    Shelda Altes 06/23/2018, 12:38 PM

## 2018-06-23 NOTE — Progress Notes (Signed)
Discharge instructions given to patient all questions answered and concerns addressed.

## 2018-06-24 ENCOUNTER — Telehealth: Payer: Self-pay

## 2018-06-24 LAB — CULTURE, BODY FLUID W GRAM STAIN -BOTTLE: Culture: NO GROWTH

## 2018-06-24 LAB — CULTURE, BODY FLUID-BOTTLE

## 2018-06-24 NOTE — Telephone Encounter (Signed)
Noted.  Thanks.  Agree with ER eval.

## 2018-06-24 NOTE — Telephone Encounter (Signed)
Doristine Bosworth Will is with pt and pt was d/c from [facility] on 06/23/18 with SOB and Cirrhosis. Now pt has new symptoms of confusion and has not voided except very small amt a few mins ago since 06/23/18 at 3 PM. Pt feels weak and SOB. Doristine Bosworth Will is going to take pt to [facility] ED for eval. FYI to Dr Damita Dunnings.

## 2018-06-25 ENCOUNTER — Telehealth: Payer: Self-pay

## 2018-06-25 NOTE — Telephone Encounter (Signed)
Transition Care Management Follow-up Telephone Call  Admit date: 06/18/2018 Discharge date: 06/23/2018 Dx:  A-Fib, CHF, Ascites, SOB   How have you been since you were released from the hospital? "I'm better"   Do you understand why you were in the hospital? yes   Do you understand the discharge instructions? yes   Where were you discharged to? Home. Resides alone. Doristine Bosworth available if needed.    Items Reviewed:  Medications reviewed: no, advised to bring to appt.   Allergies reviewed: yes  Dietary changes reviewed: yes  Referrals reviewed: yes   Functional Questionnaire:   Activities of Daily Living (ADLs):   He states they are independent in the following: ambulation, bathing and hygiene, feeding, continence, grooming, toileting and dressing States they require assistance with the following: none   Any transportation issues/concerns?: no   Any patient concerns? yes, concerns regarding access to providers from admission.    Confirmed importance and date/time of follow-up visits scheduled yes  Provider Appointment booked with PCP 06/30/2018 @ 12 noon.   Confirmed with patient if condition begins to worsen call PCP or go to the ER.  Patient was given the office number and encouraged to call back with question or concerns.  : yes

## 2018-06-26 NOTE — Telephone Encounter (Signed)
Noted. Thanks.

## 2018-06-29 ENCOUNTER — Ambulatory Visit (HOSPITAL_COMMUNITY)
Admission: RE | Admit: 2018-06-29 | Discharge: 2018-06-29 | Disposition: A | Discharge status: Home / Self Care | Payer: Medicare HMO | Source: Ambulatory Visit | Attending: Physician Assistant | Admitting: Physician Assistant

## 2018-06-29 ENCOUNTER — Other Ambulatory Visit: Payer: Self-pay

## 2018-06-29 ENCOUNTER — Encounter (HOSPITAL_COMMUNITY): Payer: Self-pay | Admitting: Physician Assistant

## 2018-06-29 VITALS — BP 114/76 | HR 77 | Ht 73.0 in | Wt 215.0 lb

## 2018-06-29 DIAGNOSIS — Z87891 Personal history of nicotine dependence: Secondary | ICD-10-CM | POA: Diagnosis not present

## 2018-06-29 DIAGNOSIS — R600 Localized edema: Secondary | ICD-10-CM | POA: Diagnosis not present

## 2018-06-29 DIAGNOSIS — F419 Anxiety disorder, unspecified: Secondary | ICD-10-CM | POA: Diagnosis not present

## 2018-06-29 DIAGNOSIS — I447 Left bundle-branch block, unspecified: Secondary | ICD-10-CM | POA: Diagnosis not present

## 2018-06-29 DIAGNOSIS — I38 Endocarditis, valve unspecified: Secondary | ICD-10-CM | POA: Insufficient documentation

## 2018-06-29 DIAGNOSIS — I4819 Other persistent atrial fibrillation: Secondary | ICD-10-CM | POA: Diagnosis not present

## 2018-06-29 DIAGNOSIS — Z79899 Other long term (current) drug therapy: Secondary | ICD-10-CM | POA: Insufficient documentation

## 2018-06-29 DIAGNOSIS — E663 Overweight: Secondary | ICD-10-CM | POA: Diagnosis not present

## 2018-06-29 DIAGNOSIS — I10 Essential (primary) hypertension: Secondary | ICD-10-CM | POA: Diagnosis not present

## 2018-06-29 DIAGNOSIS — I493 Ventricular premature depolarization: Secondary | ICD-10-CM | POA: Insufficient documentation

## 2018-06-29 DIAGNOSIS — Z7901 Long term (current) use of anticoagulants: Secondary | ICD-10-CM | POA: Insufficient documentation

## 2018-06-29 DIAGNOSIS — Z6828 Body mass index (BMI) 28.0-28.9, adult: Secondary | ICD-10-CM | POA: Insufficient documentation

## 2018-06-29 LAB — BASIC METABOLIC PANEL
Anion gap: 10 (ref 5–15)
BUN: 27 mg/dL — ABNORMAL HIGH (ref 8–23)
CO2: 30 mmol/L (ref 22–32)
Calcium: 9.7 mg/dL (ref 8.9–10.3)
Chloride: 97 mmol/L — ABNORMAL LOW (ref 98–111)
Creatinine, Ser: 1.34 mg/dL — ABNORMAL HIGH (ref 0.61–1.24)
GFR calc Af Amer: 59 mL/min — ABNORMAL LOW (ref >60)
GFR calc non Af Amer: 51 mL/min — ABNORMAL LOW (ref >60)
Glucose, Bld: 115 mg/dL — ABNORMAL HIGH (ref 70–99)
Potassium: 4.4 mmol/L (ref 3.5–5.1)
Sodium: 137 mmol/L (ref 135–145)

## 2018-06-29 NOTE — Progress Notes (Signed)
Primary Care Physician: Tonia Ghent, MD Primary Cardiologist: Dr [name] Referring Physician: Hospital follow up   Richard Saunders is a 78 y.o. male with a history of persistent atrial fibrillation, HTN, LBBB, aortic insufficiency, and a history of rheumatic fever who presents for follow up in the [facility]. The patient was initially diagnosed with atrial fibrillation on 05/28/18 after presenting to his PCP office with fluid overload and tachycardia. He was sent to the ER where initial ECG showed he was in atrial flutter with HR 121. Repeat ECG showed atrial fibrillation HR 87. He was started on Eliquis and metoprolol. Patient states that he had an URI just prior to onset of afib.   He was admitted to the hospital 06/18/18 with SOB and was newly diagnosed with cirrhosis of the liver, likely NASH. Of note, he converted to SR spontaneously during admission. He was diuresed and evaluated by GI. Today he reports that he feels much better with more energy and less SOB. He is in rate controlled afib today.  Today, he denies symptoms of palpitations, chest pain, orthopnea, PND, dizziness, presyncope, syncope, snoring, daytime somnolence, bleeding, or neurologic sequela. The patient is tolerating medications without difficulties and is otherwise without complaint today.    Atrial Fibrillation Risk Factors:  he does not have symptoms or diagnosis of sleep apnea. he does have a history of rheumatic fever. he does not have a history of alcohol use. he has a BMI of Body mass index is 28.37 kg/m.Marland Kitchen Filed Weights   06/29/18 1405  Weight: 97.5 kg   Family History  Problem Relation Age of Onset  . Cancer Mother        ovarian, uterine  . Aneurysm Mother        AAA in 92  . Cancer Father        Prostate  . COPD Father   . Prostate cancer Father   . Diabetes Other   . Heart disease Other        CAD <60 male  . Hypertension Other   . Heart disease Maternal  Grandfather        died from possible "heart attack" in early 48's  . Heart failure Maternal Uncle   . Colon cancer Neg Hx    The patient does not have a history of early familial atrial fibrillation or other arrhythmias.   Atrial Fibrillation Management history:  Previous antiarrhythmic drugs: none Previous cardioversions: none Previous ablations: none CHADS2VASC score: 3 Anticoagulation history: Eliquis   Past Medical History:  Diagnosis Date  . Allergy   . Anxiety   . Aortic insufficiency    a. 04/2018 Echo: EF 55-60%. Mild to Mod MR. Mild AI/TR.  Marland Kitchen Atrial fibrillation ([place])    a. Dx 04/2018. CHA2DS2VASc = 4-->Eliquis.  . Depression   . H/O: rheumatic fever 2012   (per patient) - eval by [facility] LBBB with PVC's  . History of chicken pox   . Hypertension   . LBBB (left bundle branch block)   . Organic impotence   . PVC's (premature ventricular contractions)    Past Surgical History:  Procedure Laterality Date  . Cautery for nosebleeds    . IR PARACENTESIS  06/19/2018  . Right elbow bursa removed    . TONSILLECTOMY AND ADENOIDECTOMY  1947    Current Outpatient Medications  Medication Sig Dispense Refill  . acetaminophen (TYLENOL) 500 MG tablet Take 500 mg by mouth every 6 (six) hours as needed  for headache (pain).    Marland Kitchen ALPRAZolam (XANAX) 0.25 MG tablet Take 0.5 tablets (0.125 mg total) by mouth daily as needed for anxiety.    Marland Kitchen apixaban (ELIQUIS) 5 MG TABS tablet Take 1 tablet (5 mg total) by mouth 2 (two) times daily. 180 tablet 3  . cromolyn (OPTICROM) 4 % ophthalmic solution Place 2 drops into both eyes every morning.   2  . furosemide (LASIX) 40 MG tablet Take 1 tablet (40 mg total) by mouth 2 (two) times daily for 30 days. 60 tablet 0  . lactulose (CHRONULAC) 10 GM/15ML solution Take 30 mLs (20 g total) by mouth 2 (two) times daily for 30 days. 1800 mL 0  . metoprolol succinate (TOPROL-XL) 25 MG 24 hr tablet Take 0.5 tablets (12.5 mg total) by mouth daily. 30  tablet 0  . rifaximin (XIFAXAN) 550 MG TABS tablet Take 1 tablet (550 mg total) by mouth 2 (two) times daily for 30 days. 60 tablet 0  . spironolactone (ALDACTONE) 50 MG tablet Take 1 tablet (50 mg total) by mouth 2 (two) times daily for 30 days. 60 tablet 0   No current facility-administered medications for this encounter.     Allergies  Allergen Reactions  . Colchicine Diarrhea  . Fluticasone Propionate Other (See Comments)    He didn't tolerate the taste/smell prev  . Nasacort [Triamcinolone] Other (See Comments)    Didn't tolerate    Social History   Socioeconomic History  . Marital status: Single    Spouse name: Not on file  . Number of children: Not on file  . Years of education: Not on file  . Highest education level: Not on file  Occupational History  . 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 Financial resource strain: Not on file  . Food insecurity:    Worry: Not on file    Inability: Not on file  . Transportation needs:    Medical: Not on file    Non-medical: Not on file  Tobacco Use  . Smoking status: Former Smoker    Years: 40.00    Last attempt to quit: 04/29/1978    Years since quitting: 40.1  . Smokeless tobacco: Never Used  Substance and Sexual Activity  . Alcohol use: Yes    Alcohol/week: 0.0 standard drinks    Comment: Rare  . Drug use: No  . Sexual activity: Not on file  Lifestyle  . Physical activity:    Days per week: Not on file    Minutes per session: Not on file  . Stress: Not on file  Relationships  . Social connections:    Talks on phone: Not on file    Gets together: Not on file    Attends religious service: Not on file    Active member of club or organization: Not on file    Attends meetings of clubs or organizations: Not on file    Relationship status: Not on file  . Intimate partner violence:    Fear of current or ex partner: Not on file    Emotionally abused: Not on file    Physically abused:  Not on file    Forced sexual activity: Not on file  Other Topics Concern  . Not on file  Social History Narrative   Education:  [employer]   Regular Exercise:  No   Enjoys travel / riding motorcycle / [place] are reviewed and negative  except as per the HPI above.  Physical Exam: Vitals:   06/29/18 1405  BP: 114/76  Pulse: 77  Weight: 97.5 kg  Height: 6' 1"  (1.854 m)    GEN- The patient is well appearing, alert and oriented x 3 today.   HEENT-head normocephalic, atraumatic, sclera clear, conjunctiva pink, hearing intact, trachea midline. Lungs- Clear to ausculation bilaterally, normal work of breathing Heart- irregular rate and rhythm, systolic murmur grade 3/6, no rubs or gallops  GI- soft, NT, ND, + BS Extremities- no clubbing, cyanosis, or edema MS- no significant deformity or atrophy Skin- no rash or lesion Psych- euthymic mood, full affect Neuro- strength and sensation are intact   Wt Readings from Last 3 Encounters:  06/29/18 97.5 kg  06/23/18 97.2 kg  06/16/18 108.9 kg    EKG today demonstrates afib HR 77, LBBB, QRS 138, QTc 457  Echo 05/29/18 demonstrated   1. The left ventricle has normal systolic function of 73-73%. The cavity size is normal. There is no left ventricular wall thickness. Echo evidence of normal diastolic filling patterns.  2. Normal left atrial size.  3. Normal right atrial size.  4. The mitral valve Mildly thickened. Systolic anterior motion anterior mitral leafet. Regurgitation is mild to moderate by color flow Doppler.  5. Normal tricuspid valve.  6. Tricuspid regurgitation is mild.  7. The aortic valve tricuspid. There is mild thickening and mild calcification of the aortic valve. Aortic valve regurgitation is mild by color flow Doppler.  8. No atrial level shunt detected by color flow Doppler.   Epic records are reviewed at length today  Assessment and Plan:  1. Persistent atrial fibrillation New diagnosis in  setting of URI. Was set up for DCCV but spontaneously converted during hospital admission. His symptoms of dyspnea may have been more related to his cirrhosis and fluid overload. Of note, he is in afib today and reports that he is completely unaware. Would not pursue DCCV at this time given that he is in and out of rhythm. We discussed the possibility of AAD therapy. Would avoid Multaq and amiodarone given liver issues. Patient prefers to not start AAD for the time being. If symptoms become more persistent, will revisit medications. Likely not a good ablation candidate given h/o rheumatic fever. Continue Eliquis 5 mg BID Continue Toprol 12.5 mg daily  This patients CHA2DS2-VASc Score and unadjusted Ischemic Stroke Rate (% per year) is equal to 3.2 % stroke rate/year from a score of 3  Above score calculated as 1 point each if present [CHF, HTN, DM, Vascular=MI/PAD/Aortic Plaque, Age if 65-74, or Male] Above score calculated as 2 points each if present [Age > 75, or Stroke/TIA/TE]  2. Overweight Body mass index is 28.37 kg/m. Lifestyle modifications encouraged including regular physical activity and weight loss.  3. Valvular disease Mild to moderate MR and mild AI by last echo. Followed by Dr [name]. H/o rheumatic fever.  4. Lower extremity edema  Currently on Lasix 40 mg daily and spironolactone 50 mg daily, increased during admission. Recheck Bmet today.  5. HTN Stable, no changes today.  Follow up with PCP as scheduled. Follow up with Dr [name] 2-3 months and Afib clinic in 6 months.   [facility] [address] [place],  [address] [phone] 06/29/2018 3:03 PM

## 2018-06-29 NOTE — Patient Outreach (Signed)
[facility]) Care Management  06/29/2018  Richard Saunders [birth year] [id number]   EMMI- General Discharge RED ON EMMI ALERT Day # 1 Date: 06/27/2018 Red Alert Reason:  Know who to call about changes in condition? No  Unfilled prescriptions? Yes  Able to fill today/tomorrow? No    Outreach attempt: no answer.  HIPAA compliant voice message left.   Plan: RN CM will attempt patient again within 4 business days.    [name], RN, MSN [facility] Care Management Care Management Coordinator Direct Line [phone] Toll Free: [phone]  Fax: [phone]

## 2018-06-30 ENCOUNTER — Ambulatory Visit (INDEPENDENT_AMBULATORY_CARE_PROVIDER_SITE_OTHER): Payer: Medicare HMO | Admitting: Family Medicine

## 2018-06-30 ENCOUNTER — Other Ambulatory Visit: Payer: Self-pay

## 2018-06-30 ENCOUNTER — Encounter: Payer: Self-pay | Admitting: Family Medicine

## 2018-06-30 DIAGNOSIS — I4891 Unspecified atrial fibrillation: Secondary | ICD-10-CM | POA: Diagnosis not present

## 2018-06-30 DIAGNOSIS — K7460 Unspecified cirrhosis of liver: Secondary | ICD-10-CM | POA: Diagnosis not present

## 2018-06-30 DIAGNOSIS — I5033 Acute on chronic diastolic (congestive) heart failure: Secondary | ICD-10-CM

## 2018-06-30 MED ORDER — FUROSEMIDE 40 MG PO TABS: 40.0000 mg | ORAL_TABLET | Freq: Every day | ORAL | Status: DC

## 2018-06-30 NOTE — Progress Notes (Signed)
Recent inpatient course discussed with patient.  A. fib history noted.  He is episodically gone back into normal sinus rhythm.  Cirrhosis attributed to NASH, discussed with patient.  He had cardiology follow-up previously and has GI follow-up pending.  He is currently taking Lasix 38m QD and Spironolactone 538mBID.  He is on lactulose.  He is trying to get rifaximin approved in the meantime, it was expensive and he got a temporary supply, now out of med (recently out).  He is awaiting approval on the medicine, with hope to restart medication in the near future.  We talked salt restriction and options with his diet.  His ability to concentrate is clearly better in the meantime.  He clearly feels better after paracentesis, diuresis, multiple medicine changes.  Compliant with medications.  Advised not to use Tylenol.  PMH and SH reviewed  ROS: Per HPI unless specifically indicated in ROS section   Meds, vitals, and allergies reviewed.   GEN: nad, alert and oriented HEENT: mucous membranes moist NECK: supple w/o LA CV: IRR, not tachy PULM: ctab, no inc wob ABD: soft, +bs, not tender to palpation. EXT: no edema SKIN: no acute rash No asterixis No jaundice.

## 2018-06-30 NOTE — Patient Instructions (Signed)
Keep the appointment with GI.  I'll await those notes.  Don't change your meds for now.  Take care.  Glad to see you.

## 2018-06-30 NOTE — Patient Outreach (Signed)
[facility] [place] [facility]) Care Management  06/30/2018  THAILAND DUBE 01-16-1941 [id number]    EMMI- General Discharge RED ON EMMI ALERT Day # 1 Date:06/27/2018 Red Alert Reason: Know who to call about changes in condition? No  Unfilled prescriptions? Yes  Able to fill today/tomorrow? No    Outreach attempt: no answer.  HIPAA compliant voice message left.   Plan: RN CM will attempt patient again within 4 business days.    [name], RN, MSN [place] [facility] Care Management Coordinator Direct Line [phone] Cell [phone] Toll Free: [phone]  Fax: [phone]

## 2018-06-30 NOTE — Patient Outreach (Signed)
[place] [facility]) Care Management  06/30/2018  Richard Saunders Jan 31, 1941 [id number]   EMMI-General Discharge RED ON EMMI ALERT Day #1 Date:06/27/2018 Red Alert Reason: Know who to call about changes in condition? No  Unfilled prescriptions? Yes  Able to fill today/tomorrow? No   Incoming call from patient.  He is able to verify HIPAA.  Discussed red alerts with patient. He states he knows to contact his PCP for problems.  Patient reports that he does have an unfilled prescription for Xifaxan.  He states that he got denied from the insurance and had to get prior authorization.  Patient states that [facility] now has the response from the physician and is working on the authorization.  Patient states he feels now that is taken care of.  He states that he did get 6 pills from his pharmacist using a discount card but waiting for the completed authorization.  He states he has an appointment with his PCP today and will discuss further with physician and his cirrhosis.  Discussed with patient cirrhosis and encephalopathy.  Advised patient that CM would send information on cirrhosis and diet.  Patient verbalized understanding and appreciative of call and information.  Patient declined any further needs at this time but states he has CM number if he has any further questions or needs.   Plan: RN CM will close case.    [name], RN, MSN [place] [facility] Care Management Coordinator Direct Line [phone] Cell [phone] Toll Free: [phone]  Fax: [phone]

## 2018-07-01 ENCOUNTER — Other Ambulatory Visit (HOSPITAL_COMMUNITY): Payer: Self-pay | Admitting: *Deleted

## 2018-07-01 ENCOUNTER — Other Ambulatory Visit: Payer: Self-pay

## 2018-07-01 NOTE — Assessment & Plan Note (Signed)
Pathophysiology discussed.  He has GI follow-up pending.  He already has paperwork done to try to get approval of rifaximin.  I will defer for now.  If he has trouble getting this approved in the long run then he may need input from the GI clinic to get prior authorization.  Discussed.  No change in meds at this point.  Routine cautions given to patient.  I appreciate the help of all involved. >25 minutes spent in face to face time with patient, >50% spent in counselling or coordination of care.

## 2018-07-01 NOTE — Patient Outreach (Signed)
[facility] [facility]) Care Management  07/01/2018  Richard Saunders January 12, 1941 [id number]   EMMI- General Discharge RED ON EMMI ALERT Day # 4 Date: 06/30/2018  Red Alert Reason:  Able to fill today/tomorrow   EMMI red flag addressed with patient on 06/30/2018.  Medication needed prior authorization from physician.  Insurance company now has prior authorization from physician.  Patient waiting for insurance company to approve.  No outreach needed at this time.    Plan: RN CM will close case.  [name], RN, MSN [facility] Care Management Care Management Coordinator Direct Line [phone] Toll Free: [phone]  Fax: [phone]

## 2018-07-01 NOTE — Assessment & Plan Note (Signed)
Per cards.  He has been in and out of sinus rhythm reportedly.  No chest pain.  Still okay for outpatient follow-up.  No change in meds at this point.

## 2018-07-01 NOTE — Assessment & Plan Note (Signed)
Likely exacerbated by combination of A. fib and cirrhosis due to NASH with significant fluid retention history noted.  Status post diuresis and paracentesis.  He clearly feels better in the meantime.  He is not short of breath now.  He had cardiology follow-up in the meantime.  Recent labs noted.  He has GI follow-up pending.  Pathophysiology of his current situation discussed with patient. >25 minutes spent in face to face time with patient, >50% spent in counselling or coordination of care.

## 2018-07-02 ENCOUNTER — Ambulatory Visit: Payer: Self-pay

## 2018-07-02 DIAGNOSIS — R188 Other ascites: Secondary | ICD-10-CM | POA: Diagnosis not present

## 2018-07-02 DIAGNOSIS — K7460 Unspecified cirrhosis of liver: Secondary | ICD-10-CM | POA: Diagnosis not present

## 2018-07-10 ENCOUNTER — Telehealth (HOSPITAL_COMMUNITY): Payer: Self-pay | Admitting: *Deleted

## 2018-07-10 NOTE — Telephone Encounter (Signed)
Patient called in stating he has been having elevated HRs today in the 80-110s. He doesn't feel any different but his HR had been more controlled. Discussed with Adline Peals, PA can take an extra 1/2 tablet of metoprolol if needed for elevated HR.  If HR continues to be elevated through weekend he will call back on Monday to possibly increase metoprolol to 12.43m BID daily. Pt in agreement.

## 2018-07-13 MED ORDER — METOPROLOL SUCCINATE ER 25 MG PO TB24: 12.5000 mg | ORAL_TABLET | Freq: Two times a day (BID) | ORAL | 0 refills | Status: DC

## 2018-07-13 NOTE — Telephone Encounter (Signed)
Patient stayed on metoprolol 12.34m bid over weekend felt better and HRs were more controlled. He will continue medications at this dose and call if issues.

## 2018-07-28 DIAGNOSIS — R188 Other ascites: Secondary | ICD-10-CM | POA: Diagnosis not present

## 2018-07-28 LAB — BASIC METABOLIC PANEL
Creatinine: 1.3 (ref 0.6–1.3)
Glucose: 110
Glucose: 110
Potassium: 4.6 (ref 3.4–5.3)

## 2018-07-29 DIAGNOSIS — R188 Other ascites: Secondary | ICD-10-CM | POA: Diagnosis not present

## 2018-07-29 DIAGNOSIS — K7469 Other cirrhosis of liver: Secondary | ICD-10-CM | POA: Diagnosis not present

## 2018-08-04 ENCOUNTER — Other Ambulatory Visit: Payer: Self-pay | Admitting: Family Medicine

## 2018-08-04 DIAGNOSIS — I5033 Acute on chronic diastolic (congestive) heart failure: Secondary | ICD-10-CM

## 2018-08-06 MED ORDER — SPIRONOLACTONE 50 MG PO TABS: 50.0000 mg | ORAL_TABLET | Freq: Every day | ORAL | 1 refills | Status: DC

## 2018-08-06 NOTE — Telephone Encounter (Signed)
Sent.  Needs f/u labs prior to doxy.me or phone visit.  Labs ordered.  When did (or will) he see GI? Thanks.

## 2018-08-06 NOTE — Telephone Encounter (Signed)
Last filled ED provider... please advise if okay for pt to continue

## 2018-08-13 ENCOUNTER — Telehealth: Payer: Self-pay | Admitting: Family Medicine

## 2018-08-13 NOTE — Telephone Encounter (Signed)
Patient called to check the status of the records that would be sent to our office from Dr Buccini. He would like to make sure that Dr Damita Dunnings has these to look at.

## 2018-08-14 NOTE — Telephone Encounter (Signed)
Spoken and notified patient of Dr Josefine Class comments. Patient verbalized understanding.

## 2018-08-14 NOTE — Telephone Encounter (Signed)
They just came in yesterday and I will look at those as soon as I have a chance.  Thanks.

## 2018-08-17 NOTE — Telephone Encounter (Signed)
Notify pt.  I checked the records.  App GI help.  Agree with their plan.   Wouldn't schedule f/u in person here with the pandemic.  Reasonable for doxy visit in about 2 months at [facility], assuming he doesn't have an urgent issue in the meantime.    Also, please request a copy of the recent labs from [facility].  We have the clinic notes.    Thanks.

## 2018-08-17 NOTE — Telephone Encounter (Signed)
Pt wanted to know if labs were necessary if [facility] had just did labs recently. Dr. Damita Dunnings asked pt to get these records sent to him. Called pt and the pt said he would call them on Monday 08/10/18 and get them sent over.

## 2018-08-18 NOTE — Telephone Encounter (Signed)
Make sure he has tried heat/ice/stretching/OTC liniment.  It he has having no relief with heat/ice/stretching/OTC liniment then please schedule a web visit.  He wouldn't be a good candidate for tylenol or nsaids given his meds/situation.   Thanks.

## 2018-08-18 NOTE — Telephone Encounter (Signed)
Noted  

## 2018-08-18 NOTE — Telephone Encounter (Signed)
LMTCB for PCP recommendations.

## 2018-08-18 NOTE — Telephone Encounter (Signed)
Advised patient of Dr Josefine Class message.  Patient stated he would give them a try and call back a few days to schedule a web visit if they do not seem to help ,

## 2018-08-18 NOTE — Telephone Encounter (Signed)
Patient was notified regarding GI help and scheduling f/u visit. He did not have any urgent issues but would like to know if you have any med suggestions for his back pain. At his last OV, it was advised for him not to take NSAIDS. Request sent to [facility] for recent labs.  Please advise, thanks.

## 2018-08-20 DIAGNOSIS — K7469 Other cirrhosis of liver: Secondary | ICD-10-CM | POA: Diagnosis not present

## 2018-08-24 DIAGNOSIS — K7469 Other cirrhosis of liver: Secondary | ICD-10-CM | POA: Diagnosis not present

## 2018-08-24 DIAGNOSIS — K7581 Nonalcoholic steatohepatitis (NASH): Secondary | ICD-10-CM | POA: Diagnosis not present

## 2018-08-27 ENCOUNTER — Telehealth (HOSPITAL_COMMUNITY): Payer: Self-pay | Admitting: *Deleted

## 2018-08-27 NOTE — Telephone Encounter (Signed)
Patient called in stating he has noticed HRs in the 40s with some SBP less than 100 -- feeling very weak and tired. He decided this morning not to take his morning dose of metoprolol 12.13m. This afternoon he is feeling much better and BP 127/73 HR 56. He will trial taking metoprolol 12.57monce a day at bedtime and monitor BP/HR. He will call on Monday with report of how he is feeling.

## 2018-09-01 NOTE — Telephone Encounter (Signed)
Pt stated since cutting back metoprolol to once a day he has felt slight improvement but still having intermittent spells of fatigue and lightheadedness. Mainly happens in the morning but not everyday. He would like to speak with Dr. Damita Dunnings as he wonders if possibly more related to liver than heart. He will call back if further issues with low heart rates.

## 2018-09-18 ENCOUNTER — Telehealth: Payer: Self-pay

## 2018-09-18 ENCOUNTER — Ambulatory Visit (INDEPENDENT_AMBULATORY_CARE_PROVIDER_SITE_OTHER): Payer: Medicare HMO | Admitting: Family Medicine

## 2018-09-18 DIAGNOSIS — K7460 Unspecified cirrhosis of liver: Secondary | ICD-10-CM | POA: Diagnosis not present

## 2018-09-18 DIAGNOSIS — R05 Cough: Secondary | ICD-10-CM

## 2018-09-18 DIAGNOSIS — R059 Cough, unspecified: Secondary | ICD-10-CM

## 2018-09-18 MED ORDER — METOPROLOL SUCCINATE ER 25 MG PO TB24: 12.5000 mg | ORAL_TABLET | Freq: Every day | ORAL | Status: DC

## 2018-09-18 NOTE — Telephone Encounter (Signed)
YOUR CARDIOLOGY TEAM HAS ARRANGED FOR AN E-VISIT FOR YOUR APPOINTMENT - PLEASE REVIEW IMPORTANT INFORMATION BELOW SEVERAL DAYS PRIOR TO YOUR APPOINTMENT  Due to the recent COVID-19 pandemic, we are transitioning in-person office visits to tele-medicine visits in an effort to decrease unnecessary exposure to our patients, their families, and staff. These visits are billed to your insurance just like a normal visit is. We also encourage you to sign up for MyChart if you have not already done so. You will need a smartphone if possible. For patients that do not have this, we can still complete the visit using a regular telephone but do prefer a smartphone to enable video when possible. You may have a family member that lives with you that can help. If possible, we also ask that you have a blood pressure cuff and scale at home to measure your blood pressure, heart rate and weight prior to your scheduled appointment. Patients with clinical needs that need an in-person evaluation and testing will still be able to come to the office if absolutely necessary. If you have any questions, feel free to call our office.     YOUR PROVIDER WILL BE USING THE FOLLOWING PLATFORM TO COMPLETE YOUR VISIT: Doximity  . IF USING MYCHART - How to Download the MyChart App to Your SmartPhone   - If Apple, go to [employer] and type in MyChart in the search bar and download the app. If Android, ask patient to go to [employer] and type in [place] in the search bar and download the app. The app is free but as with any other app downloads, your phone may require you to verify saved payment information or Apple/Android password.  - You will need to then log into the app with your MyChart username and password, and select [facility] as your healthcare provider to link the account.  - When it is time for your visit, go to the MyChart app, find appointments, and click Begin Video Visit. Be sure to Select Allow for your device to  access the Microphone and Camera for your visit. You will then be connected, and your provider will be with you shortly.  **If you have any issues connecting or need assistance, please contact MyChart service desk [phone])**  **If using a computer, in order to ensure the best quality for your visit, you will need to use either of the following Internet Browsers: Insurance underwriter or [employer]**  . IF USING DOXIMITY or DOXY.ME - The staff will give you instructions on receiving your link to join the meeting the day of your visit.      2-3 DAYS BEFORE YOUR APPOINTMENT  You will receive a telephone call from one of our [place] team members - your caller ID may say "Unknown caller." If this is a video visit, we will walk you through how to get the video launched on your phone. We will remind you check your blood pressure, heart rate and weight prior to your scheduled appointment. If you have an Apple Watch or Kardia, please upload any pertinent ECG strips the day before or morning of your appointment to [place]. Our staff will also make sure you have reviewed the consent and agree to move forward with your scheduled tele-health visit.     THE DAY OF YOUR APPOINTMENT  Approximately 15 minutes prior to your scheduled appointment, you will receive a telephone call from one of [facility] team - your caller ID may say "Unknown caller."  Our staff will confirm medications, vital signs for the day and any symptoms you may be experiencing. Please have this information available prior to the time of visit start. It may also be helpful for you to have a pad of paper and pen handy for any instructions given during your visit. They will also walk you through joining the smartphone meeting if this is a video visit.    CONSENT FOR TELE-HEALTH VISIT - PLEASE REVIEW  I hereby voluntarily request, consent and authorize [facility] and its employed or contracted physicians, physician  assistants, nurse practitioners or other licensed health care professionals (the Practitioner), to provide me with telemedicine health care services (the "Services") as deemed necessary by the treating Practitioner. I acknowledge and consent to receive the Services by the Practitioner via telemedicine. I understand that the telemedicine visit will involve communicating with the Practitioner through live audiovisual communication technology and the disclosure of certain medical information by electronic transmission. I acknowledge that I have been given the opportunity to request an in-person assessment or other available alternative prior to the telemedicine visit and am voluntarily participating in the telemedicine visit.  I understand that I have the right to withhold or withdraw my consent to the use of telemedicine in the course of my care at any time, without affecting my right to future care or treatment, and that the Practitioner or I may terminate the telemedicine visit at any time. I understand that I have the right to inspect all information obtained and/or recorded in the course of the telemedicine visit and may receive copies of available information for a reasonable fee.  I understand that some of the potential risks of receiving the Services via telemedicine include:  Marland Kitchen Delay or interruption in medical evaluation due to technological equipment failure or disruption; . Information transmitted may not be sufficient (e.g. poor resolution of images) to allow for appropriate medical decision making by the Practitioner; and/or  . In rare instances, security protocols could fail, causing a breach of personal health information.  Furthermore, I acknowledge that it is my responsibility to provide information about my medical history, conditions and care that is complete and accurate to the best of my ability. I acknowledge that Practitioner's advice, recommendations, and/or decision may be based on  factors not within their control, such as incomplete or inaccurate data provided by me or distortions of diagnostic images or specimens that may result from electronic transmissions. I understand that the practice of medicine is not an exact science and that Practitioner makes no warranties or guarantees regarding treatment outcomes. I acknowledge that I will receive a copy of this consent concurrently upon execution via email to the email address I last provided but may also request a printed copy by calling the office of [facility].    I understand that my insurance will be billed for this visit.   I have read or had this consent read to me. . I understand the contents of this consent, which adequately explains the benefits and risks of the Services being provided via telemedicine.  . I have been provided ample opportunity to ask questions regarding this consent and the Services and have had my questions answered to my satisfaction. . I give my informed consent for the services to be provided through the use of telemedicine in my medical care  By participating in this telemedicine visit I agree to the above.

## 2018-09-18 NOTE — Progress Notes (Signed)
Virtual visit completed through WebEx or similar program Patient location: home  Provider location: [place] [facility], office   Limitations and rationale for visit method d/w patient.  Patient agreed to proceed.   CC: follow up.   HPI:  He had seen GI and has cardiology f/u pending.   Cirrhosis history noted.  We talked about EGD indication with GI.    He has been taking lactulose.  He usually has 1 BM a day, loose.  He can have lightheaded and more confusion if he has a day without a BM.  Discussed with patient about rationale for treatment.  He isn't SOB but he still has sig fatigue.  He feels diffusely but not focally weak.  Unclear if metoprolol contributes.  He clearly felt worse on higher dose of metoprolol.   Pulse has been low 60s usually.  BP has been 90/60s usually after taking metoprolol, slightly higher before taking his dose of metoprolol.   He has less fatigue at the end of the day prior to taking his metoprolol. He doesn't have any swelling.    He still has some cough.  He was prev improved but it is gradually returning.  No fevers, no discolored sputum.  We tabled treatment on cough for now, given the mild severity and the other consideration as described above.   Meds and allergies reviewed.   ROS: Per HPI unless specifically indicated in ROS section   NAD Speech wnl  A/P: Cirrhosis.  Lower blood pressure noted.  Discussed rationale for bowel regimen.  His mentation is described as better when he has a bowel movement daily.  Discussed screening for varices.  Discussed rationale for current medications.  Discussed options regarding his blood pressure. 1. Dec spironolactone vs dec lasix.  2. F/u with cards first with me concurrently updating GI.  3. Check labs first.   He opts for #2.  He will follow-up with cardiology and I will update both cardiology and GI.  He still has some cough.  He was prev improved but it is gradually returning.  No fevers, no  discolored sputum.  We tabled treatment on cough for now, given the mild severity and the other consideration as described above.   >25 minutes spent in face to face time via video connection with patient, >50% spent in counselling or coordination of care.

## 2018-09-20 ENCOUNTER — Other Ambulatory Visit: Payer: Self-pay

## 2018-09-20 ENCOUNTER — Emergency Department (HOSPITAL_COMMUNITY)
Admission: EM | Admit: 2018-09-20 | Discharge: 2018-09-20 | Disposition: A | Discharge status: Home / Self Care | Payer: Medicare HMO | Attending: Emergency Medicine | Admitting: Emergency Medicine

## 2018-09-20 ENCOUNTER — Encounter (HOSPITAL_COMMUNITY): Payer: Self-pay | Admitting: Emergency Medicine

## 2018-09-20 DIAGNOSIS — Y939 Activity, unspecified: Secondary | ICD-10-CM | POA: Diagnosis not present

## 2018-09-20 DIAGNOSIS — I10 Essential (primary) hypertension: Secondary | ICD-10-CM | POA: Diagnosis not present

## 2018-09-20 DIAGNOSIS — Y929 Unspecified place or not applicable: Secondary | ICD-10-CM | POA: Insufficient documentation

## 2018-09-20 DIAGNOSIS — Z87891 Personal history of nicotine dependence: Secondary | ICD-10-CM | POA: Diagnosis not present

## 2018-09-20 DIAGNOSIS — S40861A Insect bite (nonvenomous) of right upper arm, initial encounter: Secondary | ICD-10-CM | POA: Diagnosis not present

## 2018-09-20 DIAGNOSIS — R21 Rash and other nonspecific skin eruption: Secondary | ICD-10-CM

## 2018-09-20 DIAGNOSIS — R5381 Other malaise: Secondary | ICD-10-CM | POA: Diagnosis not present

## 2018-09-20 DIAGNOSIS — Y999 Unspecified external cause status: Secondary | ICD-10-CM | POA: Diagnosis not present

## 2018-09-20 DIAGNOSIS — W57XXXA Bitten or stung by nonvenomous insect and other nonvenomous arthropods, initial encounter: Secondary | ICD-10-CM

## 2018-09-20 MED ORDER — DOXYCYCLINE HYCLATE 100 MG PO CAPS: 100.0000 mg | ORAL_CAPSULE | Freq: Two times a day (BID) | ORAL | 0 refills | Status: AC

## 2018-09-20 NOTE — Discharge Instructions (Signed)
You were evaluated in the Emergency Department and after careful evaluation, we did not find any emergent condition requiring admission or further testing in the hospital.  Your symptoms today seem to be due to an infection due to a tick bite, possibly [facility] spotted fever.  Please take the doxycycline medication as directed and follow-up closely with your primary care doctor.  Please return to the Emergency Department if you experience any worsening of your condition.  We encourage you to follow up with a primary care provider.  Thank you for allowing [place] to be a part of your care.

## 2018-09-20 NOTE — ED Notes (Addendum)
Pt wanting to leave.  Explained triage process and that he shouldn't have to wait much longer.  Encouraged pt to stay.  PT agreed to stay.

## 2018-09-20 NOTE — ED Provider Notes (Signed)
[facility] Emergency Department Provider Note MRN:  [id number]  Arrival date & time: 09/20/18     Chief Complaint   tick bite; lips chapped; and Rash   History of Present Illness   Richard Saunders is a 78 y.o. year-old male with a history of A. fib, hypertension presenting to the ED with chief complaint of tick bite.  About 5 days ago patient noticed 2 ticks attached to his right upper arm, 1 of which was semi-engorged with blood.  He removed the ticks, the engorged 1 requiring tweezers.  Since that time he has developed general malaise, low energy, chapped lips, and today noticed a diffuse rash to his arms and chest.  Denies fever, no headache or vision change, no chest pain or shortness of breath, no abdominal pain, no numbness or weakness to the arms or legs.  Symptoms are constant, no exacerbating relieving factors.  Review of Systems  A complete 10 system review of systems was obtained and all systems are negative except as noted in the HPI and PMH.   Patient's Health History    Past Medical History:  Diagnosis Date  . Allergy   . Anxiety   . Aortic insufficiency    a. 04/2018 Echo: EF 55-60%. Mild to Mod MR. Mild AI/TR.  Marland Kitchen Atrial fibrillation ([place])    a. Dx 04/2018. CHA2DS2VASc = 4-->Eliquis.  . Depression   . H/O: rheumatic fever 2012   (per patient) - eval by [facility] LBBB with PVC's  . History of chicken pox   . Hypertension   . LBBB (left bundle branch block)   . Organic impotence   . PVC's (premature ventricular contractions)     Past Surgical History:  Procedure Laterality Date  . Cautery for nosebleeds    . IR PARACENTESIS  06/19/2018  . Right elbow bursa removed    . TONSILLECTOMY AND ADENOIDECTOMY  1947    Family History  Problem Relation Age of Onset  . Cancer Mother        ovarian, uterine  . Aneurysm Mother        AAA in 65  . Cancer Father        Prostate  . COPD Father   . Prostate cancer Father   . Diabetes Other   .  Heart disease Other        CAD <60 male  . Hypertension Other   . Heart disease Maternal Grandfather        died from possible "heart attack" in early 30's  . Heart failure Maternal Uncle   . Colon cancer Neg Hx     Social History   Socioeconomic History  . Marital status: Single    Spouse name: Not on file  . Number of children: Not on file  . Years of education: Not on file  . Highest education level: Not on file  Occupational History  . 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 Financial resource strain: Not on file  . Food insecurity:    Worry: Not on file    Inability: Not on file  . Transportation needs:    Medical: Not on file    Non-medical: Not on file  Tobacco Use  . Smoking status: Former Smoker    Years: 40.00    Last attempt to quit: 04/29/1978    Years since quitting: 40.4  . Smokeless tobacco: Never Used  Substance and Sexual Activity  . Alcohol  use: Yes    Alcohol/week: 0.0 standard drinks    Comment: Rare  . Drug use: No  . Sexual activity: Not on file  Lifestyle  . Physical activity:    Days per week: Not on file    Minutes per session: Not on file  . Stress: Not on file  Relationships  . Social connections:    Talks on phone: Not on file    Gets together: Not on file    Attends religious service: Not on file    Active member of club or organization: Not on file    Attends meetings of clubs or organizations: Not on file    Relationship status: Not on file  . Intimate partner violence:    Fear of current or ex partner: Not on file    Emotionally abused: Not on file    Physically abused: Not on file    Forced sexual activity: Not on file  Other Topics Concern  . 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     Physical Exam  Vital Signs and Nursing Notes reviewed Vitals:   09/20/18 1614  BP: 124/65  Pulse: 80   Resp: 16  Temp: 98.4 F (36.9 C)  SpO2: 97%    CONSTITUTIONAL: Well-appearing, NAD NEURO:  Alert and oriented x 3, no focal deficits EYES:  eyes equal and reactive ENT/NECK:  no LAD, no JVD CARDIO: Regular rate, well-perfused, normal S1 and S2 PULM:  CTAB no wheezing or rhonchi GI/GU:  normal bowel sounds, non-distended, non-tender MSK/SPINE:  No gross deformities, no edema SKIN: Erythematous macular rash to the bilateral arms and chest, blanching PSYCH:  Appropriate speech and behavior  Diagnostic and Interventional Summary    Labs Reviewed - No data to display  No orders to display    Medications - No data to display   Procedures Critical Care  ED Course and Medical Decision Making  I have reviewed the triage vital signs and the nursing notes.  Pertinent labs & imaging results that were available during my care of the patient were reviewed by me and considered in my medical decision making (see below for details).  Suspect tickborne illness, though his symptoms are not textbook for [facility] spotted fever his rash seems most consistent with this diagnosis.  Will treat with doxycycline.  Patient is with normal vital signs, no fever, no meningismus, no chest pain, no neurological deficits, no indication for further testing here in the emergency department.  PCP follow-up advised.  After the discussed management above, the patient was determined to be safe for discharge.  The patient was in agreement with this plan and all questions regarding their care were answered.  ED return precautions were discussed and the patient will return to the ED with any significant worsening of condition.  Barth Kirks. Sedonia Small, [place] [email or web address]  Final Clinical Impressions(s) / ED Diagnoses     ICD-10-CM   1. Tick bite, initial encounter W57.XXXA   2. Rash R21   3. Malaise R53.81     ED Discharge Orders         Ordered     doxycycline (VIBRAMYCIN) 100 MG capsule  2 times daily     09/20/18 1803             Maudie Flakes, MD 09/20/18 1807

## 2018-09-20 NOTE — ED Triage Notes (Signed)
Pt removed tick from R upper arm on Wednesday.  Reports lips chapped and peeling for last 3-4 days.  Generalized rash since this morning.

## 2018-09-21 NOTE — Assessment & Plan Note (Signed)
  He still has some cough.  He was prev improved but it is gradually returning.  No fevers, no discolored sputum.  We tabled treatment on cough for now, given the mild severity and the other consideration as described above.

## 2018-09-21 NOTE — Assessment & Plan Note (Signed)
  Cirrhosis.  Lower blood pressure noted.  Discussed rationale for bowel regimen.  His mentation is described as better when he has a bowel movement daily.  Discussed screening for varices.  Discussed rationale for current medications.  Discussed options regarding his blood pressure. 1. Dec spironolactone vs dec lasix.  2. F/u with cards first with me concurrently updating GI.  3. Check labs first.   He opts for #2.  He will follow-up with cardiology and I will update both cardiology and GI.

## 2018-09-22 ENCOUNTER — Telehealth: Payer: Self-pay | Admitting: Family Medicine

## 2018-09-22 NOTE — Telephone Encounter (Signed)
I saw the ER notes. I would have him finish the abx and then follow up at the end of that, sooner if needed. Thanks.

## 2018-09-22 NOTE — Telephone Encounter (Signed)
Patient advised.

## 2018-09-22 NOTE — Telephone Encounter (Signed)
Best number [phone] Pt called wanting to talk to dr Damita Dunnings regarding his condition.  Pt went [place] 5/24.  Offered virtual appointment.  Pt wanted appointment today dr Damita Dunnings doesn't have any openings.  He would like [facility]ene to call him asap

## 2018-09-22 NOTE — Telephone Encounter (Signed)
Patient says he went to the [facility] ER 09/20/2018 after the virtual OV with Dr. Damita Dunnings on Friday afternoon.  Patient was diagnosed with RMSF and given Doxycycline BID x 10 days.  Patient has had no fever but does have a rash over his body and is more weak than usual and sleeps a lot, sometimes napping a couple of hours twice a day.  Patient would just like Dr. Damita Dunnings to be updated and is asking if he needs follow up with Dr. Damita Dunnings.

## 2018-09-29 ENCOUNTER — Telehealth: Payer: Self-pay | Admitting: Interventional Cardiology

## 2018-09-29 ENCOUNTER — Telehealth (HOSPITAL_COMMUNITY): Payer: Self-pay | Admitting: *Deleted

## 2018-09-29 NOTE — Telephone Encounter (Signed)
Follow Up:      Returning your call from today. 

## 2018-09-29 NOTE — Telephone Encounter (Signed)
Spoke with pt and he had concerns about visit being virtual on Thursday.  States about 4-5 mos ago he developed Afib and 2 weeks later found out he has cirrhosis of the liver.  HR today is 94, states it is usually in the 60s.  Denies lightheadedness or dizziness.  Offered APP appt in the office as I do not have any in office openings coming up with Dr. [name]. Pt declined and states he will keep virtual visit and discuss concerns with Dr. [name] at that time.

## 2018-09-29 NOTE — Telephone Encounter (Signed)
Left message to call back  

## 2018-09-29 NOTE — Telephone Encounter (Signed)
Pt called in stating his HR is elevated in the 90s today which is high for him. He had been running in the 50s up until today. He will try taking an extra 1/2 tablet of metoprolol and see if this will return his HR to normal. If still elevated tomorrow he will continue with 1/2 tablet of metoprolol twice a day until his appt with Dr. [name] on Thursday and discuss further. Pt in agreement.

## 2018-09-29 NOTE — Telephone Encounter (Signed)
New Message              Patient Is returning a call about his appointment being changed to virture. Patient states he don't think this appointment would be accurate because he is not being seen in the office. Patient states he has been in A-fib for a while and he thinks that needs to be checked. Pls call to advise

## 2018-10-01 ENCOUNTER — Other Ambulatory Visit (HOSPITAL_COMMUNITY): Payer: Self-pay | Admitting: Physician Assistant

## 2018-10-01 ENCOUNTER — Telehealth (INDEPENDENT_AMBULATORY_CARE_PROVIDER_SITE_OTHER): Payer: Medicare HMO | Admitting: Interventional Cardiology

## 2018-10-01 ENCOUNTER — Other Ambulatory Visit: Payer: Self-pay

## 2018-10-01 ENCOUNTER — Encounter: Payer: Self-pay | Admitting: Interventional Cardiology

## 2018-10-01 VITALS — BP 106/65 | HR 86 | Ht 73.0 in | Wt 180.0 lb

## 2018-10-01 DIAGNOSIS — I493 Ventricular premature depolarization: Secondary | ICD-10-CM

## 2018-10-01 DIAGNOSIS — K7460 Unspecified cirrhosis of liver: Secondary | ICD-10-CM

## 2018-10-01 DIAGNOSIS — I4891 Unspecified atrial fibrillation: Secondary | ICD-10-CM | POA: Diagnosis not present

## 2018-10-01 DIAGNOSIS — I447 Left bundle-branch block, unspecified: Secondary | ICD-10-CM

## 2018-10-01 DIAGNOSIS — I351 Nonrheumatic aortic (valve) insufficiency: Secondary | ICD-10-CM

## 2018-10-01 DIAGNOSIS — I340 Nonrheumatic mitral (valve) insufficiency: Secondary | ICD-10-CM

## 2018-10-01 DIAGNOSIS — I517 Cardiomegaly: Secondary | ICD-10-CM

## 2018-10-01 DIAGNOSIS — Z7189 Other specified counseling: Secondary | ICD-10-CM

## 2018-10-01 NOTE — Progress Notes (Signed)
Virtual Visit via Video Note   This visit type was conducted due to national recommendations for restrictions regarding the COVID-19 Pandemic (e.g. social distancing) in an effort to limit this patient's exposure and mitigate transmission in our community.  Due to his co-morbid illnesses, this patient is at least at moderate risk for complications without adequate follow up.  This format is felt to be most appropriate for this patient at this time.  All issues noted in this document were discussed and addressed.  A limited physical exam was performed with this format.  Please refer to the patient's chart for his consent to telehealth for [facility].   Date:  10/01/2018   ID:  [name], DOB 10-05-1940, MRN [id number]  Patient Location: Home Provider Location: Home  PCP:  Tonia Ghent, MD  Cardiologist:  Sinclair Grooms, MD  Electrophysiologist:  None   Evaluation Performed:  Follow-Up Visit  Chief Complaint:  AF/  History of Present Illness:    Richard Saunders is a 78 y.o. male with PVC's, LBBB, AV block, Non alcoholic cirrhosis, AF with rate control, and chronic anticoagulation.  Has occasional encephalopathy and is on lactulose. Managed by Dr. Bryna Colander  Recent [place] fever.  Heart has been stable without orthopnea or PND. Has AF and rate usually 60-80 bpm. Concerned about HR and BP ranges that should be associated with concern. Occasionally, BP < 503 mmHg systolic and HR 546 bpm.    The patient does not have symptoms concerning for COVID-19 infection (fever, chills, cough, or new shortness of breath).    Past Medical History:  Diagnosis Date  . Allergy   . Anxiety   . Aortic insufficiency    a. 04/2018 Echo: EF 55-60%. Mild to Mod MR. Mild AI/TR.  Marland Kitchen Atrial fibrillation ([place])    a. Dx 04/2018. CHA2DS2VASc = 4-->Eliquis.  . Depression   . H/O: rheumatic fever 2012   (per patient) - eval by [facility] LBBB with PVC's  . History of  chicken pox   . Hypertension   . LBBB (left bundle branch block)   . Organic impotence   . PVC's (premature ventricular contractions)    Past Surgical History:  Procedure Laterality Date  . Cautery for nosebleeds    . IR PARACENTESIS  06/19/2018  . Right elbow bursa removed    . TONSILLECTOMY AND ADENOIDECTOMY  1947     Current Meds  Medication Sig  . ALPRAZolam (XANAX) 0.25 MG tablet Take 0.5 tablets (0.125 mg total) by mouth daily as needed for anxiety.  Marland Kitchen apixaban (ELIQUIS) 5 MG TABS tablet Take 1 tablet (5 mg total) by mouth 2 (two) times daily.  . cromolyn (OPTICROM) 4 % ophthalmic solution Place 2 drops into both eyes every morning.   . furosemide (LASIX) 40 MG tablet Take 1 tablet (40 mg total) by mouth daily for 30 days.  Marland Kitchen lactulose (CHRONULAC) 10 GM/15ML solution TAKE 20ML S BY MOUTH TWICE A DAY  . metoprolol succinate (TOPROL-XL) 25 MG 24 hr tablet Take 0.5 tablets (12.5 mg total) by mouth daily.  Marland Kitchen spironolactone (ALDACTONE) 50 MG tablet Take 1 tablet (50 mg total) by mouth 2 (two) times daily for 30 days.     Allergies:   Colchicine; Fluticasone propionate; and Nasacort [triamcinolone]   Social History   Tobacco Use  . Smoking status: Former Smoker    Years: 40.00    Last attempt to quit: 04/29/1978    Years since quitting:  40.4  . Smokeless tobacco: Never Used  Substance Use Topics  . Alcohol use: Yes    Alcohol/week: 0.0 standard drinks    Comment: Rare  . Drug use: No     Family Hx: The patient's family history includes Aneurysm in his mother; COPD in his father; Cancer in his father and mother; Diabetes in an other family member; Heart disease in his maternal grandfather and another family member; Heart failure in his maternal uncle; Hypertension in an other family member; Prostate cancer in his father. There is no history of Colon cancer.  ROS:   Please see the history of present illness.    Occasional constipation, lethargy, and unexplained weight loss.  All other systems reviewed and are negative.   Prior CV studies:   The following studies were reviewed today:  ECHOCARDIOGRAM 2020: IMPRESSIONS    1. The left ventricle has normal systolic function of 09-23%. The cavity size is normal. There is no left ventricular wall thickness. Echo evidence of normal diastolic filling patterns.  2. Normal left atrial size.  3. Normal right atrial size.  4. The mitral valve Mildly thickened. Systolic anterior motion anterior mitral leafet. Regurgitation is mild to moderate by color flow Doppler.  5. Normal tricuspid valve.  6. Tricuspid regurgitation is mild.  7. The aortic valve tricuspid. There is mild thickening and mild calcification of the aortic valve. Aortic valve regurgitation is mild by color flow Doppler.  8. No atrial level shunt detected by color flow Doppler.   Labs/Other Tests and Data Reviewed:    EKG:  06/2018, AF with controlled rate and LBBB.  Recent Labs: 05/28/2018: B Natriuretic Peptide 73.5 05/29/2018: TSH 1.773 06/11/2018: Pro B Natriuretic peptide (BNP) 81.0 06/20/2018: ALT 19 06/21/2018: Hemoglobin 13.8; Platelets 121 06/23/2018: Magnesium 1.9 06/29/2018: BUN 27; Sodium 137 07/28/2018: Creatinine 1.3; Potassium 4.6   Recent Lipid Panel Lab Results  Component Value Date/Time   CHOL 167 07/29/2016 09:14 AM   TRIG 68.0 07/29/2016 09:14 AM   HDL 71.20 07/29/2016 09:14 AM   CHOLHDL 2 07/29/2016 09:14 AM   LDLCALC 82 07/29/2016 09:14 AM    Wt Readings from Last 3 Encounters:  10/01/18 180 lb (81.6 kg)  09/20/18 186 lb (84.4 kg)  09/18/18 186 lb (84.4 kg)     Objective:    Vital Signs:  BP 106/65   Pulse 86   Ht 6' 1"  (1.854 m)   Wt 180 lb (81.6 kg)   BMI 23.75 kg/m    VITAL SIGNS:  reviewed  ASSESSMENT & PLAN:    1. Atrial fibrillation, unspecified type ([place])   2. Left ventricular hypertrophy   3. Premature ventricular contractions   4. Nonrheumatic aortic valve insufficiency   5. Nonrheumatic mitral  valve regurgitation   6. Hepatic cirrhosis, unspecified hepatic cirrhosis type, unspecified whether ascites present ([place])   7. Left bundle branch block   8. Educated About Covid-19 Virus Infection    PLAN:  1. Controlled rate. 2. Not addressed. 3. Not discussed and not symptomatic. 4. See above office report. 5. See above report 6. Major underlying medical problem and greatest risk to survival. 7. Not addressed. Will need ECG next visit.   COVID-19 Education: The signs and symptoms of COVID-19 were discussed with the patient and how to seek care for testing (follow up with PCP or arrange E-visit).  The importance of social distancing was discussed today.  Time:   Today, I have spent 15 minutes with the patient with telehealth technology discussing  the above problems.     Medication Adjustments/Labs and Tests Ordered: Current medicines are reviewed at length with the patient today.  Concerns regarding medicines are outlined above.   Tests Ordered: No orders of the defined types were placed in this encounter.   Medication Changes: No orders of the defined types were placed in this encounter.   Disposition:  Follow up in 7 month(s)  Signed, Sinclair Grooms, MD  10/01/2018 3:45 PM    [facility]

## 2018-10-01 NOTE — Patient Instructions (Signed)
Medication Instructions:  Your physician recommends that you continue on your current medications as directed. Please refer to the Current Medication list given to you today.  If you need a refill on your cardiac medications before your next appointment, please call your pharmacy.   Lab work: None If you have labs (blood work) drawn today and your tests are completely normal, you will receive your results only by: Marland Kitchen MyChart Message (if you have MyChart) OR . A paper copy in the mail If you have any lab test that is abnormal or we need to change your treatment, we will call you to review the results.  Testing/Procedures: None  Follow-Up: At [facility] you and your health needs are our priority.  As part of our continuing mission to provide you with exceptional heart care, we have created designated Provider Care Teams.  These Care Teams include your primary Cardiologist (physician) and Advanced Practice Providers (APPs -  Physician Assistants and Nurse Practitioners) who all work together to provide you with the care you need, when you need it. You will need a follow up appointment in 6 months.  Please call our office 2 months in advance to schedule this appointment.  You may see Sinclair Grooms, MD or one of the following Advanced Practice Providers on your designated Care Team:   Truitt Merle, NP Cecilie Kicks, NP . Kathyrn Drown, NP  Any Other Special Instructions Will Be Listed Below (If Applicable).

## 2018-10-02 ENCOUNTER — Encounter: Payer: Self-pay | Admitting: Family Medicine

## 2018-10-03 ENCOUNTER — Encounter: Payer: Self-pay | Admitting: Family Medicine

## 2018-10-04 ENCOUNTER — Telehealth: Payer: Self-pay | Admitting: Family Medicine

## 2018-10-04 NOTE — Telephone Encounter (Signed)
Please call patient about his rash.  Please triage him and see what his situation is.  Thanks.

## 2018-10-05 ENCOUNTER — Ambulatory Visit (INDEPENDENT_AMBULATORY_CARE_PROVIDER_SITE_OTHER): Payer: Medicare HMO | Admitting: Family Medicine

## 2018-10-05 ENCOUNTER — Ambulatory Visit: Payer: Medicare HMO | Admitting: Family Medicine

## 2018-10-05 ENCOUNTER — Other Ambulatory Visit: Payer: Self-pay

## 2018-10-05 ENCOUNTER — Encounter: Payer: Self-pay | Admitting: Family Medicine

## 2018-10-05 VITALS — BP 98/50 | HR 126 | Temp 98.3°F | Ht 73.0 in | Wt 186.2 lb

## 2018-10-05 DIAGNOSIS — R21 Rash and other nonspecific skin eruption: Secondary | ICD-10-CM

## 2018-10-05 DIAGNOSIS — R000 Tachycardia, unspecified: Secondary | ICD-10-CM | POA: Diagnosis not present

## 2018-10-05 DIAGNOSIS — I5033 Acute on chronic diastolic (congestive) heart failure: Secondary | ICD-10-CM

## 2018-10-05 MED ORDER — SPIRONOLACTONE 50 MG PO TABS: 50.0000 mg | ORAL_TABLET | Freq: Two times a day (BID) | ORAL | Status: DC

## 2018-10-05 MED ORDER — FUROSEMIDE 20 MG PO TABS: ORAL_TABLET | ORAL | Status: DC

## 2018-10-05 MED ORDER — SPIRONOLACTONE 50 MG PO TABS: ORAL_TABLET | ORAL | Status: DC

## 2018-10-05 MED ORDER — FUROSEMIDE 20 MG PO TABS: 20.0000 mg | ORAL_TABLET | Freq: Every day | ORAL | Status: DC

## 2018-10-05 NOTE — Patient Instructions (Signed)
Drink sips of fluids.  If you feel worse, then go to the ER or dial 911.  Hold the spironolactone and lasix for now.  Restart both with 1 a day of each when your weight is at least 185 lbs.  Update me in a few days, sooner if needed.  I think your skin will gradually heal over.  Take care.  Glad to see you.

## 2018-10-05 NOTE — Progress Notes (Signed)
Prev had oral irritation. He has some flaking of skin at prev rash sites.  Noted on the chest, near B axilla, and hands.  No fevers or aches.    Done with doxy at this point.  Prev tick bite noted, with presumed tickborne illness.  Skin changes predate doxycycline, so it is assumed that the medication is not causative of the rash.    Lower BP and elevated pulse noted today at OV.   Pulse was in the lower 80s earlier today.  Pulse up to 120s at OV initially, lower of recheck.    Weight is down to 180 at home, over the last few days.  Prior to that, about 1 week ago, he was 184-185 lbs.  He has been on diuretics consistently.  He wass lightheaded on standing this AM.  No syncope.  His fluid intake was down recently with nausea from doxy use.    He feels closer to normal today, better now than this AM.    Meds, vitals, and allergies reviewed.   ROS: Per HPI unless specifically indicated in ROS section   nad ncat Neck supple, no LA IRR ctab  abd soft Ext w/o edema.  Recheck pulse 102-104.   Recheck BP 90/60.  Flaking of skin at prev rash sites.  Noted on the chest, near B axilla, and hands.  No ulceration. He didn't feel orthostatic on standing.

## 2018-10-05 NOTE — Telephone Encounter (Addendum)
I spoke with pt; pt still has slight rash on chest,shoulders and arms,skin is peeling where rash was. Pt is still feeling weak and lightheaded; this AM was so swimmy headed he could not stand up earlier this morning; is better now. The room did not go around but pts balance was off to the point of holding on until could sit down. Pt said even when sitting still had lightheadedness and ringing in ears. No covid symptoms; has had cough for long time; no travel and no known exposure to covid or flu.pt wanted to be seen today. Pt scheduled in office appt today at 3 PM.

## 2018-10-05 NOTE — Telephone Encounter (Signed)
Will see at [place].  Thanks.

## 2018-10-06 LAB — COMPREHENSIVE METABOLIC PANEL
ALT: 36 U/L (ref 0–53)
AST: 51 U/L — ABNORMAL HIGH (ref 0–37)
Albumin: 3.4 g/dL — ABNORMAL LOW (ref 3.5–5.2)
Alkaline Phosphatase: 74 U/L (ref 39–117)
BUN: 32 mg/dL — ABNORMAL HIGH (ref 6–23)
CO2: 29 mEq/L (ref 19–32)
Calcium: 10 mg/dL (ref 8.4–10.5)
Chloride: 99 mEq/L (ref 96–112)
Creatinine, Ser: 1.29 mg/dL (ref 0.40–1.50)
GFR: 53.93 mL/min — ABNORMAL LOW (ref >60.00)
Glucose, Bld: 123 mg/dL — ABNORMAL HIGH (ref 70–99)
Potassium: 5.3 mEq/L — ABNORMAL HIGH (ref 3.5–5.1)
Sodium: 135 mEq/L (ref 135–145)
Total Bilirubin: 3.3 mg/dL — ABNORMAL HIGH (ref 0.2–1.2)
Total Protein: 6.3 g/dL (ref 6.0–8.3)

## 2018-10-06 LAB — CBC WITH DIFFERENTIAL/PLATELET
Basophils Absolute: 0 10*3/uL (ref 0.0–0.1)
Basophils Relative: 0.5 % (ref 0.0–3.0)
Eosinophils Absolute: 0 10*3/uL (ref 0.0–0.7)
Eosinophils Relative: 0.3 % (ref 0.0–5.0)
HCT: 43.9 % (ref 39.0–52.0)
Hemoglobin: 15 g/dL (ref 13.0–17.0)
Lymphocytes Relative: 15.6 % (ref 12.0–46.0)
Lymphs Abs: 1.4 10*3/uL (ref 0.7–4.0)
MCHC: 34.2 g/dL (ref 30.0–36.0)
MCV: 107.1 fl — ABNORMAL HIGH (ref 78.0–100.0)
Monocytes Absolute: 0.9 10*3/uL (ref 0.1–1.0)
Monocytes Relative: 10.8 % (ref 3.0–12.0)
Neutro Abs: 6.3 10*3/uL (ref 1.4–7.7)
Neutrophils Relative %: 72.8 % (ref 43.0–77.0)
Platelets: 184 10*3/uL (ref 150.0–400.0)
RBC: 4.1 Mil/uL — ABNORMAL LOW (ref 4.22–5.81)
RDW: 14.9 % (ref 11.5–15.5)
WBC: 8.6 10*3/uL (ref 4.0–10.5)

## 2018-10-07 ENCOUNTER — Other Ambulatory Visit: Payer: Self-pay | Admitting: Family Medicine

## 2018-10-07 DIAGNOSIS — I5033 Acute on chronic diastolic (congestive) heart failure: Secondary | ICD-10-CM

## 2018-10-07 NOTE — Assessment & Plan Note (Signed)
Appears to be improving and healing over.  Would observe for now.

## 2018-10-07 NOTE — Assessment & Plan Note (Addendum)
It looks like he has been relatively over diuresed in combination with decreased p.o. intake related to doxycycline use Drink sips of fluids.  ER cautions given. Hold the spironolactone and lasix for now.  Restart both with 1 a day of each when his weight is at least 185 lbs.  Update me in a few days, sooner if needed.  He agrees with plan.  Given that he is not orthostatic on standing at this point and his pulse improved at the office visit and he already feels better than he did this morning, it appears that he is still okay for outpatient follow-up. >25 minutes spent in face to face time with patient, >50% spent in counselling or coordination of care

## 2018-10-08 ENCOUNTER — Telehealth: Payer: Self-pay | Admitting: Family Medicine

## 2018-10-08 NOTE — Telephone Encounter (Signed)
Copied from [place] [phone]. Topic: Quick Communication - See Telephone Encounter >> Oct 08, 2018  4:35 PM Blase Mess A wrote: CRM for notification. See Telephone encounter for: 10/08/18.  Patient is calling back for lab results. [phone] (M)

## 2018-10-09 ENCOUNTER — Telehealth: Payer: Self-pay

## 2018-10-09 ENCOUNTER — Other Ambulatory Visit (INDEPENDENT_AMBULATORY_CARE_PROVIDER_SITE_OTHER): Payer: Medicare HMO

## 2018-10-09 DIAGNOSIS — I5033 Acute on chronic diastolic (congestive) heart failure: Secondary | ICD-10-CM

## 2018-10-09 LAB — BASIC METABOLIC PANEL
BUN: 30 mg/dL — ABNORMAL HIGH (ref 6–23)
CO2: 28 mEq/L (ref 19–32)
Calcium: 9.6 mg/dL (ref 8.4–10.5)
Chloride: 101 mEq/L (ref 96–112)
Creatinine, Ser: 1.14 mg/dL (ref 0.40–1.50)
GFR: 62.2 mL/min (ref >60.00)
Glucose, Bld: 98 mg/dL (ref 70–99)
Potassium: 4.8 mEq/L (ref 3.5–5.1)
Sodium: 137 mEq/L (ref 135–145)

## 2018-10-09 MED ORDER — FUROSEMIDE 20 MG PO TABS: ORAL_TABLET | ORAL | Status: DC

## 2018-10-09 MED ORDER — TRIAMCINOLONE ACETONIDE 0.1 % EX CREA: 1.0000 "application " | TOPICAL_CREAM | Freq: Two times a day (BID) | CUTANEOUS | 1 refills | Status: AC | PRN

## 2018-10-09 MED ORDER — SPIRONOLACTONE 50 MG PO TABS: ORAL_TABLET | ORAL | Status: DC

## 2018-10-09 NOTE — Telephone Encounter (Signed)
Records sent as requested

## 2018-10-09 NOTE — Telephone Encounter (Signed)
Copied from [place] [phone]. Topic: Quick Communication - See Telephone Encounter >> Oct 08, 2018  4:35 PM Blase Mess A wrote: CRM for notification. See Telephone encounter for: 10/08/18.  Patient is calling back for lab results. [phone] (M)

## 2018-10-09 NOTE — Telephone Encounter (Signed)
See other message. Thank you

## 2018-10-09 NOTE — Telephone Encounter (Signed)
Patient asked for his lab results and all of this information to be sent over to Dr. Cristina Gong for their record because he sees them also.

## 2018-10-09 NOTE — Telephone Encounter (Signed)
Pt called the office to schedule lab visit today for some additional labs ordered by Dr. Damita Dunnings.  Scheduled him at 12:30.  Also, he wants to talk to Dr. Milinda Cave about some concerns.  Best contact #  [phone]

## 2018-10-09 NOTE — Telephone Encounter (Signed)
Spoke with patient see other message.

## 2018-10-09 NOTE — Telephone Encounter (Signed)
Agree with restart lasix and spironolactone, have him update me about how he feels and his weight on Monday.   Since he is done with the antibiotic already and doesn't have a fever, then I wouldn't extend the antibiotic.  For the rash/itching, I would try topical triamcinolone.  I sent the rx.  I realize he didn't tolerate nasal steroids. This is a topical steroid.  He should still be able to use this on the rash w/o troubles like he had with the nasal medicine.  Thanks.

## 2018-10-09 NOTE — Telephone Encounter (Signed)
Patient advised of everything.

## 2018-10-09 NOTE — Telephone Encounter (Signed)
Spoke with patient. Patient aware of lab results and will come today to get additional lab work done. Here is the update from patient: 1) His weight is up by 6 to 7 lbs as of today from what he weighed on Monday. He is going to go ahead and start taking Lasix 20 mg 1 tablet and Spironolactone 50 mg 1 tablet.  2) His dizziness/lightheadedness is about 60-70% better since his last visit.  3) Patient states he had RMSF about 3 weeks ago and finished his antibiotic about 1 week ago. Rash that he had (Dr. Damita Dunnings saw on Monday per patient) got worse last night. It was bright red color, covered almost his whole forearm and chest area. It was worse than when we saw patient. Itching bad. As of this morning patient states rash is better though-about 30% better. He still does not have any fever. He has not been using anything to apply to the area. He was concerned. He did not want to go through the weekend and not knowing what to do. Please review.

## 2018-10-12 ENCOUNTER — Encounter: Payer: Self-pay | Admitting: *Deleted

## 2018-10-12 ENCOUNTER — Telehealth: Payer: Self-pay | Admitting: Family Medicine

## 2018-10-12 ENCOUNTER — Other Ambulatory Visit: Payer: Self-pay | Admitting: Family Medicine

## 2018-10-12 MED ORDER — SPIRONOLACTONE 50 MG PO TABS: ORAL_TABLET | ORAL | Status: DC

## 2018-10-12 MED ORDER — FUROSEMIDE 20 MG PO TABS: ORAL_TABLET | ORAL | Status: DC

## 2018-10-12 NOTE — Telephone Encounter (Signed)
Best number [phone] Pt called checking on lab results And to let you know how he is doing  Pt stated he is doing a little better and his weight is 190 and has been this for 3 days

## 2018-10-12 NOTE — Telephone Encounter (Signed)
Note copied to lab result note and sent to Dr. Damita Dunnings

## 2018-10-14 DIAGNOSIS — K7469 Other cirrhosis of liver: Secondary | ICD-10-CM | POA: Diagnosis not present

## 2018-10-16 ENCOUNTER — Encounter: Payer: Self-pay | Admitting: Family Medicine

## 2018-10-21 DIAGNOSIS — Z1211 Encounter for screening for malignant neoplasm of colon: Secondary | ICD-10-CM | POA: Diagnosis not present

## 2018-10-21 DIAGNOSIS — K7581 Nonalcoholic steatohepatitis (NASH): Secondary | ICD-10-CM | POA: Diagnosis not present

## 2018-10-21 DIAGNOSIS — K7469 Other cirrhosis of liver: Secondary | ICD-10-CM | POA: Diagnosis not present

## 2018-10-22 ENCOUNTER — Other Ambulatory Visit: Payer: Self-pay | Admitting: Family Medicine

## 2018-10-22 ENCOUNTER — Other Ambulatory Visit (INDEPENDENT_AMBULATORY_CARE_PROVIDER_SITE_OTHER): Payer: Medicare HMO

## 2018-10-22 DIAGNOSIS — M109 Gout, unspecified: Secondary | ICD-10-CM | POA: Diagnosis not present

## 2018-10-22 DIAGNOSIS — K7460 Unspecified cirrhosis of liver: Secondary | ICD-10-CM

## 2018-10-22 DIAGNOSIS — Z125 Encounter for screening for malignant neoplasm of prostate: Secondary | ICD-10-CM

## 2018-10-22 DIAGNOSIS — I5033 Acute on chronic diastolic (congestive) heart failure: Secondary | ICD-10-CM | POA: Diagnosis not present

## 2018-10-23 ENCOUNTER — Other Ambulatory Visit: Payer: Self-pay

## 2018-10-23 ENCOUNTER — Telehealth: Payer: Self-pay | Admitting: *Deleted

## 2018-10-23 ENCOUNTER — Encounter: Payer: Self-pay | Admitting: Family Medicine

## 2018-10-23 ENCOUNTER — Ambulatory Visit (INDEPENDENT_AMBULATORY_CARE_PROVIDER_SITE_OTHER): Payer: Medicare HMO | Admitting: Family Medicine

## 2018-10-23 DIAGNOSIS — S51811D Laceration without foreign body of right forearm, subsequent encounter: Secondary | ICD-10-CM | POA: Insufficient documentation

## 2018-10-23 DIAGNOSIS — S51811A Laceration without foreign body of right forearm, initial encounter: Secondary | ICD-10-CM | POA: Diagnosis not present

## 2018-10-23 LAB — COMPREHENSIVE METABOLIC PANEL
ALT: 24 U/L (ref 0–53)
AST: 38 U/L — ABNORMAL HIGH (ref 0–37)
Albumin: 3.4 g/dL — ABNORMAL LOW (ref 3.5–5.2)
Alkaline Phosphatase: 79 U/L (ref 39–117)
BUN: 25 mg/dL — ABNORMAL HIGH (ref 6–23)
CO2: 32 mEq/L (ref 19–32)
Calcium: 9.8 mg/dL (ref 8.4–10.5)
Chloride: 102 mEq/L (ref 96–112)
Creatinine, Ser: 1.14 mg/dL (ref 0.40–1.50)
GFR: 62.19 mL/min (ref >60.00)
Glucose, Bld: 108 mg/dL — ABNORMAL HIGH (ref 70–99)
Potassium: 4.7 mEq/L (ref 3.5–5.1)
Sodium: 142 mEq/L (ref 135–145)
Total Bilirubin: 2.6 mg/dL — ABNORMAL HIGH (ref 0.2–1.2)
Total Protein: 6.1 g/dL (ref 6.0–8.3)

## 2018-10-23 LAB — URIC ACID: Uric Acid, Serum: 8 mg/dL — ABNORMAL HIGH (ref 4.0–7.8)

## 2018-10-23 LAB — LIPID PANEL
Cholesterol: 146 mg/dL (ref 0–200)
HDL: 51.1 mg/dL (ref >39.00)
LDL Cholesterol: 78 mg/dL (ref 0–99)
NonHDL: 94.83
Total CHOL/HDL Ratio: 3
Triglycerides: 86 mg/dL (ref 0.0–149.0)
VLDL: 17.2 mg/dL (ref 0.0–40.0)

## 2018-10-23 LAB — PSA, MEDICARE: PSA: 0.04 ng/ml — ABNORMAL LOW (ref 0.10–4.00)

## 2018-10-23 NOTE — Telephone Encounter (Signed)
   [facility] Pre-operative Risk Assessment    Request for surgical clearance:  1. What type of surgery is being performed? COLONOSCOPY/ENDOSCOPY  2. When is this surgery scheduled? 12/15/18  3. What type of clearance is required (medical clearance vs. Pharmacy clearance to hold med vs. Both)? BOTH  4. Are there any medications that need to be held prior to surgery and how long? ELIQUIS  5. Practice name and name of physician performing surgery? [facility]; DR. Cristina Gong  6. What is your office phone number [phone]   7.   What is your office fax number [phone]  8.   Anesthesia type (None, local, MAC, general) ? PROPOFOL? NOT LISTED   Julaine Hua 10/23/2018, 12:40 PM  _________________________________________________________________   (provider comments below)

## 2018-10-23 NOTE — Patient Instructions (Addendum)
Wound dressed again today. Return if any streaking redness or warmth or fevers develop.  Schedule nurse visit for re-dressing wound on Tuesday.   Wound Care, Adult Taking care of your wound properly can help to prevent pain, infection, and scarring. It can also help your wound to heal more quickly.  How to care for your wound  Wound care  Follow instructions from your health care provider about how to take care of your wound. Make sure you: ? Wash your hands with soap and water before you change the bandage (dressing). If soap and water are not available, use hand sanitizer. ? Change your dressing as told by your health care provider. ? Leave stitches (sutures), skin glue, or adhesive strips in place. These skin closures may need to stay in place for 2 weeks or longer. If adhesive strip edges start to loosen and curl up, you may trim the loose edges. Do not remove adhesive strips completely unless your health care provider tells you to do that.  Check your wound area every day for signs of infection. Check for: ? Redness, swelling, or pain. ? Fluid or blood. ? Warmth. ? Pus or a bad smell.  Ask your health care provider if you should clean the wound with mild soap and water. Doing this may include: ? Using a clean towel to pat the wound dry after cleaning it. Do not rub or scrub the wound. ? Applying a cream or ointment. Do this only as told by your health care provider. ? Covering the incision with a clean dressing.  Ask your health care provider when you can leave the wound uncovered.  Keep the dressing dry until your health care provider says it can be removed. Do not take baths, swim, use a hot tub, or do anything that would put the wound underwater until your health care provider approves. Ask your health care provider if you can take showers. You may only be allowed to take sponge baths. Medicines   If you were prescribed an antibiotic medicine, cream, or ointment, take or use  the antibiotic as told by your health care provider. Do not stop taking or using the antibiotic even if your condition improves.  Take over-the-counter and prescription medicines only as told by your health care provider. If you were prescribed pain medicine, take it 30 or more minutes before you do any wound care or as told by your health care provider. General instructions  Return to your normal activities as told by your health care provider. Ask your health care provider what activities are safe.  Do not scratch or pick at the wound.  Do not use any products that contain nicotine or tobacco, such as cigarettes and e-cigarettes. These may delay wound healing. If you need help quitting, ask your health care provider.  Keep all follow-up visits as told by your health care provider. This is important.  Eat a diet that includes protein, vitamin A, vitamin C, and other nutrient-rich foods to help the wound heal. ? Foods rich in protein include meat, dairy, beans, nuts, and other sources. ? Foods rich in vitamin A include carrots and dark green, leafy vegetables. ? Foods rich in vitamin C include citrus, tomatoes, and other fruits and vegetables. ? Nutrient-rich foods have protein, carbohydrates, fat, vitamins, or minerals. Eat a variety of healthy foods including vegetables, fruits, and whole grains. Contact a health care provider if:  You received a tetanus shot and you have swelling, severe pain, redness, or bleeding  at the injection site.  Your pain is not controlled with medicine.  You have redness, swelling, or pain around the wound.  You have fluid or blood coming from the wound.  Your wound feels warm to the touch.  You have pus or a bad smell coming from the wound.  You have a fever or chills.  You are nauseous or you vomit.  You are dizzy. Get help right away if:  You have a red streak going away from your wound.  The edges of the wound open up and separate.  Your  wound is bleeding, and the bleeding does not stop with gentle pressure.  You have a rash.  You faint.  You have trouble breathing. Summary  Always wash your hands with soap and water before changing your bandage (dressing).  To help with healing, eat foods that are rich in protein, vitamin A, vitamin C, and other nutrients.  Check your wound every day for signs of infection. Contact your health care provider if you suspect that your wound is infected. This information is not intended to replace advice given to you by your health care provider. Make sure you discuss any questions you have with your health care provider. Document Released: 01/23/2008 Document Revised: 08/03/2018 Document Reviewed: 10/31/2015 Elsevier Patient Education  2020 [employer].

## 2018-10-23 NOTE — Progress Notes (Signed)
This visit was conducted in person.  BP 106/70 (BP Location: Left Arm, Patient Position: Sitting, Cuff Size: Normal)   Pulse 62   Temp 97.6 F (36.4 C) (Tympanic)   Ht 6' 1"  (1.854 m)   Wt 197 lb 3 oz (89.4 kg)   SpO2 97%   BMI 26.02 kg/m   BP Readings from Last 3 Encounters:  10/23/18 106/70  10/05/18 (!) 98/50  10/01/18 106/65    CC: R forearm wound check Subjective:    Patient ID: Richard Saunders, male    DOB: [birth year], 78 y.o.   MRN: [id number]  HPI: Richard Saunders is a 78 y.o. male presenting on 10/23/2018 for Wound Check (Pt injured posterio right arm on 10/21/18. Wants to have wound checked. )    DOI: 10/21/2018 Door hit him at R forearm at GI doctor's office. Skin flap injury. Treated with peroxide, abx ointment and dressing change at GI doctor's office.   Requests wound checked and re-dressed today.   He is on eliquis blood thinner.   RMSF a few months ago.  Upcoming EGD/colonoscopy.      Relevant past medical, surgical, family and social history reviewed and updated as indicated. Interim medical history since our last visit reviewed. Allergies and medications reviewed and updated. Outpatient Medications Prior to Visit  Medication Sig Dispense Refill  . ALPRAZolam (XANAX) 0.25 MG tablet Take 0.5 tablets (0.125 mg total) by mouth daily as needed for anxiety.    Marland Kitchen apixaban (ELIQUIS) 5 MG TABS tablet Take 1 tablet (5 mg total) by mouth 2 (two) times daily. 180 tablet 3  . cromolyn (OPTICROM) 4 % ophthalmic solution Place 2 drops into both eyes every morning.   2  . furosemide (LASIX) 20 MG tablet Take 74m a day if weight is above 185 lbs. If weight goes above 190, then take 236mtwice a day 30 tablet   . lactulose (CHRONULAC) 10 GM/15ML solution Takes 10 ml 2 times daily.    . metoprolol succinate (TOPROL-XL) 25 MG 24 hr tablet Take 0.5 tablets (12.5 mg total) by mouth daily.    . Marland Kitchenpironolactone (ALDACTONE) 50 MG tablet Take 1 tab a day if weight is >185  lbs. If weight goes above 190, then take 1 tab twice a day    . triamcinolone cream (KENALOG) 0.1 % Apply 1 application topically 2 (two) times daily as needed. 30 g 1   No facility-administered medications prior to visit.      Per HPI unless specifically indicated in ROS section below Review of Systems Objective:    BP 106/70 (BP Location: Left Arm, Patient Position: Sitting, Cuff Size: Normal)   Pulse 62   Temp 97.6 F (36.4 C) (Tympanic)   Ht 6' 1"  (1.854 m)   Wt 197 lb 3 oz (89.4 kg)   SpO2 97%   BMI 26.02 kg/m   Wt Readings from Last 3 Encounters:  10/23/18 197 lb 3 oz (89.4 kg)  10/05/18 186 lb 3 oz (84.5 kg)  10/01/18 180 lb (81.6 kg)    Physical Exam Vitals signs and nursing note reviewed.  Constitutional:      Appearance: Normal appearance. He is not ill-appearing.  Skin:    General: Skin is warm and dry.     Findings: Wound present. No bruising or erythema.          Comments: V shaped flap on R lateral forearm without surrounding erythema or drainage  Neurological:     Mental Status: He  is alert.    Wound care: Wound dressed with triple abx ointment and non stick pad, then wrapped with flexible gauze and kerlex     Assessment & Plan:   Problem List Items Addressed This Visit    Skin tear of forearm without complication, right, initial encounter    Wound dressed. Home care reviewed.  He has PCP appt next week, requests visit next week for dressing change - will see if we can schedule nurse visit on Tuesday for this. I can see pt if needed.          No orders of the defined types were placed in this encounter.  No orders of the defined types were placed in this encounter.   Follow up plan: Return if symptoms worsen or fail to improve.  Ria Bush, MD

## 2018-10-23 NOTE — Assessment & Plan Note (Signed)
Wound dressed. Home care reviewed.  He has PCP appt next week, requests visit next week for dressing change - will see if we can schedule nurse visit on Tuesday for this. I can see pt if needed.

## 2018-10-23 NOTE — Telephone Encounter (Signed)
Pt takes Eliquis for afib with CHADS2VASc score of 4 (age x2, CHF, HTN). SCr 1.14, CrCl 61m/min. Ok to hold Eliquis 1-2 days prior to procedure.

## 2018-10-26 NOTE — Telephone Encounter (Signed)
Left voice mail

## 2018-10-27 ENCOUNTER — Ambulatory Visit: Payer: Medicare HMO

## 2018-10-27 DIAGNOSIS — S51811A Laceration without foreign body of right forearm, initial encounter: Secondary | ICD-10-CM

## 2018-10-27 NOTE — Progress Notes (Signed)
Pt came in for bandage change of skin tears on the right arm.   Skin tears were very clean looking. Healing well. No redness or drainage.   Applied triple antibiotic ointment to both tears. Applied nonstick pad. Wrapped with gauze. Then wrapped with Coban.   Pt will return 10-29-18 for an appt with Dr Damita Dunnings.

## 2018-10-28 NOTE — Telephone Encounter (Signed)
Patient called back returning [employer] call

## 2018-10-28 NOTE — Telephone Encounter (Signed)
   Primary Cardiologist: Sinclair Grooms, MD  Chart reviewed and patient contacted by phone today as part of pre-operative protocol coverage. Given past medical history and time since last visit, based on ACC/AHA guidelines, Richard Saunders would be at acceptable risk for the planned procedure without further cardiovascular testing.   OK to hold Eliquis 1-2 days pre op (pt is aware)  I will route this recommendation to the requesting party via [facility] fax function and remove from pre-op pool.  Please call with questions.  Kerin Ransom, PA-C 10/28/2018, 2:11 PM

## 2018-10-29 ENCOUNTER — Ambulatory Visit (INDEPENDENT_AMBULATORY_CARE_PROVIDER_SITE_OTHER): Payer: Medicare HMO | Admitting: Family Medicine

## 2018-10-29 ENCOUNTER — Other Ambulatory Visit: Payer: Self-pay

## 2018-10-29 ENCOUNTER — Encounter: Payer: Self-pay | Admitting: Family Medicine

## 2018-10-29 ENCOUNTER — Ambulatory Visit: Payer: Medicare HMO

## 2018-10-29 VITALS — BP 104/58 | HR 56 | Temp 98.1°F | Ht 73.0 in | Wt 198.0 lb

## 2018-10-29 DIAGNOSIS — K7460 Unspecified cirrhosis of liver: Secondary | ICD-10-CM

## 2018-10-29 DIAGNOSIS — Z659 Problem related to unspecified psychosocial circumstances: Secondary | ICD-10-CM | POA: Diagnosis not present

## 2018-10-29 DIAGNOSIS — Z0001 Encounter for general adult medical examination with abnormal findings: Secondary | ICD-10-CM | POA: Diagnosis not present

## 2018-10-29 DIAGNOSIS — Z0000 Encounter for general adult medical examination without abnormal findings: Secondary | ICD-10-CM

## 2018-10-29 DIAGNOSIS — I4891 Unspecified atrial fibrillation: Secondary | ICD-10-CM

## 2018-10-29 DIAGNOSIS — S51811D Laceration without foreign body of right forearm, subsequent encounter: Secondary | ICD-10-CM

## 2018-10-29 DIAGNOSIS — Z7189 Other specified counseling: Secondary | ICD-10-CM

## 2018-10-29 MED ORDER — FUROSEMIDE 20 MG PO TABS: ORAL_TABLET | ORAL | Status: DC

## 2018-10-29 MED ORDER — SPIRONOLACTONE 50 MG PO TABS: ORAL_TABLET | ORAL | Status: AC

## 2018-10-29 NOTE — Patient Instructions (Signed)
Dose your fluid pills based on your weight.  Update me as needed.   I'll await the GI notes.  Take care.  Glad to see you.

## 2018-10-29 NOTE — Progress Notes (Signed)
I have personally reviewed the Medicare Annual Wellness questionnaire and have noted 1. The patient's medical and social history 2. Their use of alcohol, tobacco or illicit drugs 3. Their current medications and supplements 4. The patient's functional ability including ADL's, fall risks, home safety risks and hearing or visual             impairment. 5. Diet and physical activities 6. Evidence for depression or mood disorders  The patients weight, height, BMI have been recorded in the chart and visual acuity is per eye clinic.  I have made referrals, counseling and provided education to the patient based review of the above and I have provided the pt with a written personalized care plan for preventive services.  Provider list updated- see scanned forms.  Routine anticipatory guidance given to patient.  See health maintenance. The possibility exists that previously documented standard health maintenance information may have been brought forward from a previous encounter into this note.  If needed, that same information has been updated to reflect the current situation based on today's encounter.    Flu 2019 Shingles d/w pt.  PNA UTD Tetanus 2019 Colonoscopy f/u pending.  Prostate cancer screening 2020 Living will d/w pt.  Would have daughter April designated if patient were incapacitated.   Cognitive function addressed- see scanned forms- and if abnormal then additional documentation follows.   D/w pt about mood.  Pandemic consideration d/w pt.  His social life is clearly curtailed in the meantime, along with chronic medical considerations.  No SI/HI.  We talked about coping strategies.    AF.  Still on 2 tabs of lasix and spironolactone a day. Weight has been up some and he still has some bloating.  He isn't SOB.  He feels better in the last 2 weeks overall.  Still anticoagulated w/o bleeding.   No recently BZD use.    Liver disease.  He has fatigue in the AM but he clearly looks and  feels better compared to prev OVs.  He has GI f/u pending.   His skin rash has improved/resolved in the meantime.   Skin tear R forearm.  Bandaged.  Has been healing normally.   PMH and SH reviewed  Meds, vitals, and allergies reviewed.   ROS: Per HPI.  Unless specifically indicated otherwise in HPI, the patient denies:  General: fever. Eyes: acute vision changes ENT: sore throat Cardiovascular: chest pain Respiratory: SOB GI: vomiting GU: dysuria Musculoskeletal: acute back pain Derm: acute rash Neuro: acute motor dysfunction Psych: worsening mood Endocrine: polydipsia Heme: bleeding Allergy: hayfever  GEN: nad, alert and oriented HEENT: mucous membranes moist NECK: supple w/o LA CV: rrr with occ ectopy noted.  PULM: ctab, no inc wob ABD: soft, +bs EXT: no edema SKIN: no acute rash.  He does have a check-shaped skin tear, 4cm long side, 1cm short site, on R forearm and appears to be healing normally. No discharge.

## 2018-10-31 DIAGNOSIS — Z0000 Encounter for general adult medical examination without abnormal findings: Secondary | ICD-10-CM | POA: Insufficient documentation

## 2018-10-31 DIAGNOSIS — Z659 Problem related to unspecified psychosocial circumstances: Secondary | ICD-10-CM | POA: Insufficient documentation

## 2018-10-31 NOTE — Assessment & Plan Note (Signed)
Still on 2 tabs of lasix and spironolactone a day. Weight has been up some and he still has some bloating.  He isn't SOB.  He feels better in the last 2 weeks overall.  Still anticoagulated w/o bleeding.  Continue with weight based diuretic dosing, see orders.

## 2018-10-31 NOTE — Assessment & Plan Note (Signed)
He has fatigue in the AM but he clearly looks and feels better compared to prev OVs.  He has GI f/u pending.

## 2018-10-31 NOTE — Assessment & Plan Note (Signed)
D/w pt about mood.  Pandemic consideration d/w pt.  His social life is clearly curtailed in the meantime, along with chronic medical considerations.  No SI/HI.  We talked about coping strategies.  He can update me as needed. Okay for outpatient f/u.

## 2018-10-31 NOTE — Assessment & Plan Note (Signed)
Living will d/w pt.  Would have daughter April designated if patient were incapacitated.

## 2018-10-31 NOTE — Assessment & Plan Note (Signed)
Flu 2019 Shingles d/w pt.  PNA UTD Tetanus 2019 Colonoscopy f/u pending.  Prostate cancer screening 2020 Living will d/w pt.  Would have daughter April designated if patient were incapacitated.   Cognitive function addressed- see scanned forms- and if abnormal then additional documentation follows.

## 2018-10-31 NOTE — Assessment & Plan Note (Signed)
Normal healing, redressed, continue with routine care.

## 2018-11-16 DIAGNOSIS — H40013 Open angle with borderline findings, low risk, bilateral: Secondary | ICD-10-CM | POA: Diagnosis not present

## 2018-11-16 DIAGNOSIS — H4321 Crystalline deposits in vitreous body, right eye: Secondary | ICD-10-CM | POA: Diagnosis not present

## 2018-11-16 DIAGNOSIS — D3131 Benign neoplasm of right choroid: Secondary | ICD-10-CM | POA: Diagnosis not present

## 2018-11-16 DIAGNOSIS — H10413 Chronic giant papillary conjunctivitis, bilateral: Secondary | ICD-10-CM | POA: Diagnosis not present

## 2018-11-16 DIAGNOSIS — H04123 Dry eye syndrome of bilateral lacrimal glands: Secondary | ICD-10-CM | POA: Diagnosis not present

## 2018-11-16 DIAGNOSIS — H43813 Vitreous degeneration, bilateral: Secondary | ICD-10-CM | POA: Diagnosis not present

## 2018-11-16 DIAGNOSIS — G453 Amaurosis fugax: Secondary | ICD-10-CM | POA: Diagnosis not present

## 2018-11-16 DIAGNOSIS — Z961 Presence of intraocular lens: Secondary | ICD-10-CM | POA: Diagnosis not present

## 2018-11-17 ENCOUNTER — Telehealth: Payer: Self-pay | Admitting: Interventional Cardiology

## 2018-11-17 DIAGNOSIS — G453 Amaurosis fugax: Secondary | ICD-10-CM

## 2018-11-17 NOTE — Telephone Encounter (Signed)
Patient had amaurosis fugax and was seen by Dr. Katy Fitch. Dr. Katy Fitch recommended MRI and carotid Doppler.  He recommended that the patient go to the emergency room, but the patient refused.  Episode lasted less than 5 minutes.  Description was classic.  He had a shade-like visual scotoma that lasted less than 5 minutes before completely resolving.  I spoke with the patient and recommended bilateral carotid Doppler.  He is on anticoagulation therapy currently.  He had a recent echocardiogram performed in 2020 that did not reveal significant abnormality.  He is back to baseline currently.  I do not think an MRI will help [place].  We will schedule a bilateral carotid Doppler.  Patient's lipid panel done recently revealed an LDL of 78.

## 2018-11-17 NOTE — Telephone Encounter (Signed)
Carotid doppler ordered.

## 2018-11-17 NOTE — Telephone Encounter (Signed)
Spoke with Larene Beach at Dr. Zenia Resides office.  She states that pt was in the office yesterday due to some vision issues and it was discovered that pt had Amaurosis Fujax.  They tried to send pt to ER for STAT echo and doppler studies but pt refused.  They did not know pt's heart hx so they decided to contact our office to see if we could convince the pt to go.  Spoke with Dr. [name] and he will reach out to pt.

## 2018-11-17 NOTE — Telephone Encounter (Signed)
° °  [facility] called  The patient called and stated the patient has  Richard Saunders  The office tried to send him to the ER for a Doppler study, but the patient refused to go. The Ophthalmologist wants the patient to have an MRI, and would like to know Dr. Thompson Caul opinion.

## 2018-11-18 ENCOUNTER — Other Ambulatory Visit: Payer: Self-pay

## 2018-11-18 ENCOUNTER — Ambulatory Visit (HOSPITAL_COMMUNITY)
Admission: RE | Admit: 2018-11-18 | Discharge: 2018-11-18 | Disposition: A | Discharge status: Home / Self Care | Payer: Medicare HMO | Source: Ambulatory Visit | Attending: Cardiovascular Disease | Admitting: Cardiovascular Disease

## 2018-11-18 DIAGNOSIS — G453 Amaurosis fugax: Secondary | ICD-10-CM | POA: Diagnosis not present

## 2018-11-18 NOTE — Telephone Encounter (Signed)
Noted.  I will await the ultrasound results.  I thank all involved.

## 2018-11-23 DIAGNOSIS — D3131 Benign neoplasm of right choroid: Secondary | ICD-10-CM | POA: Diagnosis not present

## 2018-11-23 DIAGNOSIS — H40013 Open angle with borderline findings, low risk, bilateral: Secondary | ICD-10-CM | POA: Diagnosis not present

## 2018-11-23 DIAGNOSIS — H04123 Dry eye syndrome of bilateral lacrimal glands: Secondary | ICD-10-CM | POA: Diagnosis not present

## 2018-11-23 DIAGNOSIS — Z961 Presence of intraocular lens: Secondary | ICD-10-CM | POA: Diagnosis not present

## 2018-11-23 DIAGNOSIS — H10413 Chronic giant papillary conjunctivitis, bilateral: Secondary | ICD-10-CM | POA: Diagnosis not present

## 2018-11-23 DIAGNOSIS — H4321 Crystalline deposits in vitreous body, right eye: Secondary | ICD-10-CM | POA: Diagnosis not present

## 2018-11-23 DIAGNOSIS — G453 Amaurosis fugax: Secondary | ICD-10-CM | POA: Diagnosis not present

## 2018-11-23 DIAGNOSIS — H43813 Vitreous degeneration, bilateral: Secondary | ICD-10-CM | POA: Diagnosis not present

## 2018-12-10 DIAGNOSIS — Z1159 Encounter for screening for other viral diseases: Secondary | ICD-10-CM | POA: Diagnosis not present

## 2018-12-15 ENCOUNTER — Encounter: Payer: Self-pay | Admitting: Family Medicine

## 2018-12-15 DIAGNOSIS — K5730 Diverticulosis of large intestine without perforation or abscess without bleeding: Secondary | ICD-10-CM | POA: Diagnosis not present

## 2018-12-15 DIAGNOSIS — I8510 Secondary esophageal varices without bleeding: Secondary | ICD-10-CM | POA: Diagnosis not present

## 2018-12-15 DIAGNOSIS — K766 Portal hypertension: Secondary | ICD-10-CM | POA: Diagnosis not present

## 2018-12-15 DIAGNOSIS — K3189 Other diseases of stomach and duodenum: Secondary | ICD-10-CM | POA: Diagnosis not present

## 2018-12-15 DIAGNOSIS — D123 Benign neoplasm of transverse colon: Secondary | ICD-10-CM | POA: Diagnosis not present

## 2018-12-15 DIAGNOSIS — Z1211 Encounter for screening for malignant neoplasm of colon: Secondary | ICD-10-CM | POA: Diagnosis not present

## 2018-12-15 DIAGNOSIS — K7460 Unspecified cirrhosis of liver: Secondary | ICD-10-CM | POA: Diagnosis not present

## 2018-12-15 LAB — HM COLONOSCOPY

## 2018-12-18 DIAGNOSIS — D123 Benign neoplasm of transverse colon: Secondary | ICD-10-CM | POA: Diagnosis not present

## 2018-12-31 ENCOUNTER — Other Ambulatory Visit: Payer: Self-pay

## 2018-12-31 ENCOUNTER — Encounter (HOSPITAL_COMMUNITY): Payer: Self-pay | Admitting: Physician Assistant

## 2018-12-31 ENCOUNTER — Ambulatory Visit (HOSPITAL_COMMUNITY)
Admission: RE | Admit: 2018-12-31 | Discharge: 2018-12-31 | Disposition: A | Discharge status: Home / Self Care | Payer: Medicare HMO | Source: Ambulatory Visit | Attending: Physician Assistant | Admitting: Physician Assistant

## 2018-12-31 VITALS — BP 118/80 | HR 98 | Ht 73.0 in | Wt 192.6 lb

## 2018-12-31 DIAGNOSIS — I4819 Other persistent atrial fibrillation: Secondary | ICD-10-CM | POA: Diagnosis not present

## 2018-12-31 DIAGNOSIS — F419 Anxiety disorder, unspecified: Secondary | ICD-10-CM | POA: Diagnosis not present

## 2018-12-31 DIAGNOSIS — Z87891 Personal history of nicotine dependence: Secondary | ICD-10-CM | POA: Insufficient documentation

## 2018-12-31 DIAGNOSIS — I080 Rheumatic disorders of both mitral and aortic valves: Secondary | ICD-10-CM | POA: Insufficient documentation

## 2018-12-31 DIAGNOSIS — I10 Essential (primary) hypertension: Secondary | ICD-10-CM | POA: Insufficient documentation

## 2018-12-31 DIAGNOSIS — I447 Left bundle-branch block, unspecified: Secondary | ICD-10-CM | POA: Diagnosis not present

## 2018-12-31 DIAGNOSIS — Z79899 Other long term (current) drug therapy: Secondary | ICD-10-CM | POA: Insufficient documentation

## 2018-12-31 DIAGNOSIS — K7460 Unspecified cirrhosis of liver: Secondary | ICD-10-CM | POA: Diagnosis not present

## 2018-12-31 DIAGNOSIS — Z7901 Long term (current) use of anticoagulants: Secondary | ICD-10-CM | POA: Diagnosis not present

## 2018-12-31 DIAGNOSIS — Z888 Allergy status to other drugs, medicaments and biological substances status: Secondary | ICD-10-CM | POA: Diagnosis not present

## 2018-12-31 DIAGNOSIS — Z8249 Family history of ischemic heart disease and other diseases of the circulatory system: Secondary | ICD-10-CM | POA: Insufficient documentation

## 2018-12-31 NOTE — Progress Notes (Signed)
Primary Care Physician: Tonia Ghent, MD Primary Cardiologist: Dr [name] Referring Physician: Hospital follow up   Richard Saunders is a 78 y.o. male with a history of persistent atrial fibrillation, HTN, LBBB, aortic insufficiency, and a history of rheumatic fever who presents for follow up in the [facility]. The patient was initially diagnosed with atrial fibrillation on 05/28/18 after presenting to his PCP office with fluid overload and tachycardia. He was sent to the ER where initial ECG showed he was in atrial flutter with HR 121. Repeat ECG showed atrial fibrillation HR 87. He was started on Eliquis and metoprolol. Patient states that he had an URI just prior to onset of afib. He was admitted to the hospital 06/18/18 with SOB and was newly diagnosed with cirrhosis of the liver, likely NASH. Of note, he converted to SR spontaneously during admission. He was diuresed and evaluated by GI.   On follow up today, patient reports that he has done reasonably well from a cardiac standpoint. He was recently treated for suspected [facility] spotted fever and also had an episode of amaurosis fugax but carotid US showed no significant stenosis. He is in asymptomatic afib today. He checks his BP and heart rate daily at home and his heart rate is typically 70s-80s.   Today, he denies symptoms of palpitations, chest pain, orthopnea, PND, dizziness, presyncope, syncope, snoring, daytime somnolence, bleeding, or neurologic sequela. The patient is tolerating medications without difficulties and is otherwise without complaint today.    Atrial Fibrillation Risk Factors:  he does not have symptoms or diagnosis of sleep apnea. he does have a history of rheumatic fever. he does not have a history of alcohol use.  he has a BMI of Body mass index is 25.41 kg/m.Marland Kitchen  Filed Weights   12/31/18 1413  Weight: 87.4 kg     Family History  Problem Relation Age of Onset  . Cancer  Mother        ovarian, uterine  . Aneurysm Mother        AAA in 63  . Cancer Father        Prostate  . COPD Father   . Prostate cancer Father   . Diabetes Other   . Heart disease Other        CAD <60 male  . Hypertension Other   . Heart disease Maternal Grandfather        died from possible "heart attack" in early 35's  . Heart failure Maternal Uncle   . Colon cancer Neg Hx    The patient does not have a history of early familial atrial fibrillation or other arrhythmias.   Atrial Fibrillation Management history:  Previous antiarrhythmic drugs: none Previous cardioversions: none Previous ablations: none CHADS2VASC score: 3 Anticoagulation history: Eliquis   Past Medical History:  Diagnosis Date  . Allergy   . Anxiety   . Aortic insufficiency    a. 04/2018 Echo: EF 55-60%. Mild to Mod MR. Mild AI/TR.  Marland Kitchen Atrial fibrillation ([place])    a. Dx 04/2018. CHA2DS2VASc = 4-->Eliquis.  . Depression   . H/O: rheumatic fever 2012   (per patient) - eval by [facility] LBBB with PVC's  . History of chicken pox   . Hypertension   . LBBB (left bundle branch block)   . Organic impotence   . PVC's (premature ventricular contractions)    Past Surgical History:  Procedure Laterality Date  . Cautery for nosebleeds    .  IR PARACENTESIS  06/19/2018  . Right elbow bursa removed    . TONSILLECTOMY AND ADENOIDECTOMY  1947    Current Outpatient Medications  Medication Sig Dispense Refill  . ALPRAZolam (XANAX) 0.25 MG tablet Take 0.5 tablets (0.125 mg total) by mouth daily as needed for anxiety.    Marland Kitchen apixaban (ELIQUIS) 5 MG TABS tablet Take 1 tablet (5 mg total) by mouth 2 (two) times daily. 180 tablet 3  . cromolyn (OPTICROM) 4 % ophthalmic solution Place 2 drops into both eyes every morning.   2  . furosemide (LASIX) 20 MG tablet Take 79m a day if weight is above 185 lbs. If weight 190-195, then take 239mtwice a day.   If weight >195, then take 2038m times a day.    . lactulose  (CHRONULAC) 10 GM/15ML solution Takes 10 ml 2 times daily.    . metoprolol succinate (TOPROL-XL) 25 MG 24 hr tablet Take 0.5 tablets (12.5 mg total) by mouth daily.    . sMarland Kitchenironolactone (ALDACTONE) 50 MG tablet Take 1 tab a day if weight is >185 lbs. If weight 190-195, then take 1 tab twice a day.   If weight >195, then take 1 tab 3 times a day.    . triamcinolone cream (KENALOG) 0.1 % Apply 1 application topically 2 (two) times daily as needed. 30 g 1   No current facility-administered medications for this encounter.     Allergies  Allergen Reactions  . Colchicine Diarrhea  . Fluticasone Propionate Other (See Comments)    He didn't tolerate the taste/smell prev  . Nasacort [Triamcinolone] Other (See Comments)    Didn't tolerate    Social History   Socioeconomic History  . Marital status: Single    Spouse name: Not on file  . Number of children: Not on file  . Years of education: Not on file  . Highest education level: Not on file  Occupational History  . 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 Food insecurity    Worry: Not on file    Inability: Not on file  . Transportation needs    Medical: Not on file    Non-medical: Not on file  Tobacco Use  . Smoking status: Former Smoker    Years: 40.00    Quit date: 04/29/1978    Years since quitting: 40.7  . Smokeless tobacco: Never Used  Substance and Sexual Activity  . Alcohol use: Not Currently    Alcohol/week: 0.0 standard drinks  . Drug use: No  . Sexual activity: Not on file  Lifestyle  . Physical activity    Days per week: Not on file    Minutes per session: Not on file  . Stress: Not on file  Relationships  . Social conHerbalist phone: Not on file    Gets together: Not on file    Attends religious service: Not on file    Active member of club or organization: Not on file    Attends meetings of clubs or organizations: Not on  file    Relationship status: Not on file  . Intimate partner violence    Fear of current or ex partner: Not on file    Emotionally abused: Not on file    Physically abused: Not on file    Forced sexual activity: Not on file  Other Topics Concern  . Not on file  Social  History Narrative   Education:  [employer]   Regular Exercise:  No   Enjoys travel / riding motorcycle / [place] are reviewed and negative except as per the HPI above.  Physical Exam: Vitals:   12/31/18 1413  BP: 118/80  Pulse: 98  Weight: 87.4 kg  Height: 6' 1"  (1.854 m)    GEN- The patient is well appearing elderly male, alert and oriented x 3 today.   HEENT-head normocephalic, atraumatic, sclera clear, conjunctiva pink, hearing intact, trachea midline. Lungs- Clear to ausculation bilaterally, normal work of breathing Heart- irregular rate and rhythm, no murmurs, rubs or gallops  GI- soft, NT, ND, + BS Extremities- no clubbing, cyanosis, or edema MS- no significant deformity or atrophy Skin- no rash or lesion Psych- euthymic mood, full affect Neuro- strength and sensation are intact   Wt Readings from Last 3 Encounters:  12/31/18 87.4 kg  10/29/18 89.8 kg  10/23/18 89.4 kg    EKG today demonstrates afib HR 98, LBBB, motion artifact, QRS 122, QTc 464  Echo 05/29/18 demonstrated   1. The left ventricle has normal systolic function of 78-67%. The cavity size is normal. There is no left ventricular wall thickness. Echo evidence of normal diastolic filling patterns.  2. Normal left atrial size.  3. Normal right atrial size.  4. The mitral valve Mildly thickened. Systolic anterior motion anterior mitral leafet. Regurgitation is mild to moderate by color flow Doppler.  5. Normal tricuspid valve.  6. Tricuspid regurgitation is mild.  7. The aortic valve tricuspid. There is mild thickening and mild calcification of the aortic valve. Aortic valve regurgitation is mild by color flow  Doppler.  8. No atrial level shunt detected by color flow Doppler.   Epic records are reviewed at length today  Assessment and Plan:  1. Persistent atrial fibrillation Patient in asymptomatic afib with overall controlled heart rates. We discussed the possibility of AAD therapy. Given patient's age, paucity of symptoms, and comorbidities, patient prefers to not start AAD for the time being and pursue rate control.  He is not a good ablation candidate given h/o rheumatic fever. Continue Eliquis 5 mg BID Continue Toprol 12.5 mg daily Lifestyle modification was discussed and encouraged including regular physical activity and weight reduction.  This patients CHA2DS2-VASc Score and unadjusted Ischemic Stroke Rate (% per year) is equal to 3.2 % stroke rate/year from a score of 3  Above score calculated as 1 point each if present [CHF, HTN, DM, Vascular=MI/PAD/Aortic Plaque, Age if 65-74, or Male] Above score calculated as 2 points each if present [Age > 75, or Stroke/TIA/TE]  2. Valvular disease Mild to moderate MR and mild AI by last echo. Followed by Dr [name]. H/o rheumatic fever.  3. HTN Stable, no change today.   Follow up with Dr [name] per recall. AF clinic as needed.   [facility] [address] [place], [place] [address] [phone] 12/31/2018 3:17 PM

## 2019-01-29 ENCOUNTER — Encounter: Payer: Self-pay | Admitting: Gastroenterology

## 2019-03-02 ENCOUNTER — Other Ambulatory Visit: Payer: Self-pay | Admitting: Interventional Cardiology

## 2019-03-03 DIAGNOSIS — K766 Portal hypertension: Secondary | ICD-10-CM | POA: Diagnosis not present

## 2019-03-03 DIAGNOSIS — K7469 Other cirrhosis of liver: Secondary | ICD-10-CM | POA: Diagnosis not present

## 2019-03-03 DIAGNOSIS — R188 Other ascites: Secondary | ICD-10-CM | POA: Diagnosis not present

## 2019-03-03 DIAGNOSIS — K7581 Nonalcoholic steatohepatitis (NASH): Secondary | ICD-10-CM | POA: Diagnosis not present

## 2019-03-03 DIAGNOSIS — K3189 Other diseases of stomach and duodenum: Secondary | ICD-10-CM | POA: Diagnosis not present

## 2019-03-08 ENCOUNTER — Other Ambulatory Visit: Payer: Self-pay | Admitting: Gastroenterology

## 2019-03-08 DIAGNOSIS — G453 Amaurosis fugax: Secondary | ICD-10-CM | POA: Diagnosis not present

## 2019-03-08 DIAGNOSIS — D3131 Benign neoplasm of right choroid: Secondary | ICD-10-CM | POA: Diagnosis not present

## 2019-03-08 DIAGNOSIS — H40013 Open angle with borderline findings, low risk, bilateral: Secondary | ICD-10-CM | POA: Diagnosis not present

## 2019-03-08 DIAGNOSIS — H4321 Crystalline deposits in vitreous body, right eye: Secondary | ICD-10-CM | POA: Diagnosis not present

## 2019-03-08 DIAGNOSIS — H43813 Vitreous degeneration, bilateral: Secondary | ICD-10-CM | POA: Diagnosis not present

## 2019-03-08 DIAGNOSIS — Z961 Presence of intraocular lens: Secondary | ICD-10-CM | POA: Diagnosis not present

## 2019-03-08 DIAGNOSIS — K7469 Other cirrhosis of liver: Secondary | ICD-10-CM

## 2019-03-08 DIAGNOSIS — H353131 Nonexudative age-related macular degeneration, bilateral, early dry stage: Secondary | ICD-10-CM | POA: Diagnosis not present

## 2019-03-08 DIAGNOSIS — H04123 Dry eye syndrome of bilateral lacrimal glands: Secondary | ICD-10-CM | POA: Diagnosis not present

## 2019-03-08 DIAGNOSIS — H10413 Chronic giant papillary conjunctivitis, bilateral: Secondary | ICD-10-CM | POA: Diagnosis not present

## 2019-03-12 ENCOUNTER — Ambulatory Visit
Admission: RE | Admit: 2019-03-12 | Discharge: 2019-03-12 | Disposition: A | Discharge status: Home / Self Care | Payer: Medicare HMO | Source: Ambulatory Visit | Attending: Gastroenterology | Admitting: Gastroenterology

## 2019-03-12 DIAGNOSIS — K7460 Unspecified cirrhosis of liver: Secondary | ICD-10-CM | POA: Diagnosis not present

## 2019-03-12 DIAGNOSIS — K7469 Other cirrhosis of liver: Secondary | ICD-10-CM | POA: Diagnosis not present

## 2019-03-19 ENCOUNTER — Other Ambulatory Visit (HOSPITAL_COMMUNITY): Payer: Self-pay | Admitting: Family Medicine

## 2019-05-04 ENCOUNTER — Other Ambulatory Visit (HOSPITAL_COMMUNITY): Payer: Self-pay | Admitting: Family Medicine

## 2019-05-04 NOTE — Progress Notes (Signed)
Cardiology Office Note:    Date:  05/06/2019   ID:  Richard Saunders, DOB Dec 13, 1940, MRN [id number]  PCP:  Richard Ghent, MD  Cardiologist:  Sinclair Grooms, MD   Referring MD: Richard Ghent, MD   Chief Complaint  Patient presents with  . Atrial Fibrillation    History of Present Illness:    Richard Saunders is a 79 y.o. male with a hx of  PVC's, LBBB, AV block, Non alcoholic cirrhosis, AF with rate control, and chronic anticoagulation.  Richard Saunders is doing well.  He has not had syncope palpitations or chest pain.  He denies excessive dyspnea.  No lower extremity swelling.  He is compliant with his medications.  No blood in his urine or stool.  Past Medical History:  Diagnosis Date  . Allergy   . Anxiety   . Aortic insufficiency    a. 04/2018 Echo: EF 55-60%. Mild to Mod MR. Mild AI/TR.  Marland Kitchen Atrial fibrillation ([place])    a. Dx 04/2018. CHA2DS2VASc = 4-->Eliquis.  . Depression   . H/O: rheumatic fever 2012   (per patient) - eval by [facility] LBBB with PVC's  . History of chicken pox   . Hypertension   . LBBB (left bundle branch block)   . Organic impotence   . PVC's (premature ventricular contractions)     Past Surgical History:  Procedure Laterality Date  . Cautery for nosebleeds    . IR PARACENTESIS  06/19/2018  . Right elbow bursa removed    . TONSILLECTOMY AND ADENOIDECTOMY  1947    Current Medications: Current Meds  Medication Sig  . ALPRAZolam (XANAX) 0.25 MG tablet Take 0.5 tablets (0.125 mg total) by mouth daily as needed for anxiety.  Marland Kitchen apixaban (ELIQUIS) 5 MG TABS tablet Take 1 tablet (5 mg total) by mouth 2 (two) times daily.  . cromolyn (OPTICROM) 4 % ophthalmic solution Place 2 drops into both eyes every morning.   . furosemide (LASIX) 20 MG tablet Take 20 mg by mouth daily.  Marland Kitchen lactulose (CHRONULAC) 10 GM/15ML solution Takes 10 ml 2 times daily.  . metoprolol succinate (TOPROL-XL) 25 MG 24 hr tablet Take 0.5 tablets (12.5 mg total) by mouth  2 (two) times daily.  Marland Kitchen spironolactone (ALDACTONE) 50 MG tablet Take 1 tab a day if weight is >185 lbs. If weight 190-195, then take 1 tab twice a day.   If weight >195, then take 1 tab 3 times a day.  . triamcinolone cream (KENALOG) 0.1 % Apply 1 application topically 2 (two) times daily as needed.  . [DISCONTINUED] metoprolol succinate (TOPROL-XL) 25 MG 24 hr tablet TAKE 1/2 TABLET (12.5 MG) DAILY     Allergies:   Colchicine, Fluticasone propionate, and Nasacort [triamcinolone]   Social History   Socioeconomic History  . Marital status: Single    Spouse name: Not on file  . Number of children: Not on file  . Years of education: Not on file  . Highest education level: Not on file  Occupational History  . Occupation: Retired Armed forces technical officer: retired  Tobacco Use  . Smoking status: Former Smoker    Years: 40.00    Quit date: 04/29/1978    Years since quitting: 41.0  . Smokeless tobacco: Never Used  Substance and Sexual Activity  . Alcohol use: Not Currently    Alcohol/week: 0.0 standard drinks  . Drug use: No  . Sexual activity: Not on file  Other Topics Concern  .  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 Difficulty of Paying Saunders Expenses: Not on file  Food Insecurity:   . Worried About Charity fundraiser in the Last Year: Not on file  . Ran Out of Food in the Last Year: Not on file  Transportation Needs:   . Lack of Transportation (Medical): Not on file  . Lack of Transportation (Non-Medical): Not on file  Physical Activity:   . Days of Exercise per Week: Not on file  . Minutes of Exercise per Session: Not on file  Stress:   . Feeling of Stress : Not on file  Social Connections:   . Frequency of Communication with Friends and Family: Not on file  . Frequency of Social Gatherings with Friends  and Family: Not on file  . Attends Religious Services: Not on file  . Active Member of Clubs or Organizations: Not on file  . Attends Archivist Meetings: Not on file  . Marital Status: Not on file     Family History: The patient's family history includes Aneurysm in his mother; COPD in his father; Cancer in his father and mother; Diabetes in an other family member; Heart disease in his maternal grandfather and another family member; Heart failure in his maternal uncle; Hypertension in an other family member; Prostate cancer in his father. There is no history of Colon cancer.  ROS:   Please see the history of present illness.    No blood in the urine or stool.  If he gets constipated he develops encephalopathy.  All other systems reviewed and are negative.  EKGs/Labs/Other Studies Reviewed:    The following studies were reviewed today: No new data.  Echocardiogram January 2020: IMPRESSIONS    1. The left ventricle has normal systolic function of 62-95%. The cavity size is normal. There is no left ventricular wall thickness. Echo evidence of normal diastolic filling patterns.  2. Normal left atrial size.  3. Normal right atrial size.  4. The mitral valve Mildly thickened. Systolic anterior motion anterior mitral leafet. Regurgitation is mild to moderate by color flow Doppler.  5. Normal tricuspid valve.  6. Tricuspid regurgitation is mild.  7. The aortic valve tricuspid. There is mild thickening and mild calcification of the aortic valve. Aortic valve regurgitation is mild by color flow Doppler.  8. No atrial level shunt detected by color flow Doppler.   EKG:  EKG atrial flutter/atrial tachycardia with 2-1 AV conduction and resting heart rate 115 bpm with left bundle branch block conduction.  Recent Labs: 05/28/2018: B Natriuretic Peptide 73.5 05/29/2018: TSH 1.773 06/11/2018: Pro B Natriuretic peptide (BNP) 81.0 06/23/2018: Magnesium 1.9 10/05/2018: Hemoglobin 15.0;  Platelets 184.0 10/22/2018: ALT 24; BUN 25; Creatinine, Ser 1.14; Potassium 4.7; Sodium 142  Recent Lipid Panel    Component Value Date/Time   CHOL 146 10/22/2018 1436   TRIG 86.0 10/22/2018 1436   HDL 51.10 10/22/2018 1436   CHOLHDL 3 10/22/2018 1436   VLDL 17.2 10/22/2018 1436   LDLCALC 78 10/22/2018 1436    Physical Exam:    VS:  BP 116/82   Pulse (!) 114   Ht 6' 1"  (1.854 m)   Wt 189 lb 1.9 oz (85.8 kg)   SpO2 98%   BMI 24.95 kg/m     Wt Readings  from Last 3 Encounters:  05/06/19 189 lb 1.9 oz (85.8 kg)  12/31/18 192 lb 9.6 oz (87.4 kg)  10/29/18 198 lb (89.8 kg)     GEN: Appears older than his stated age. No acute distress HEENT: Normal NECK: No JVD. LYMPHATICS: No lymphadenopathy CARDIAC: Rapid RRR without murmur, gallop, or edema. VASCULAR:  Normal Pulses. No bruits. RESPIRATORY:  Clear to auscultation without rales, wheezing or rhonchi  ABDOMEN: Soft, non-tender, non-distended, No pulsatile mass, MUSCULOSKELETAL: No deformity  SKIN: Warm and dry NEUROLOGIC:  Alert and oriented x 3 PSYCHIATRIC:  Normal affect   ASSESSMENT:    1. Persistent atrial fibrillation ([place])   2. Amaurosis fugax   3. Left ventricular hypertrophy   4. Nonrheumatic aortic valve insufficiency   5. Premature ventricular contractions   6. Left bundle branch block   7. Hepatic cirrhosis, unspecified hepatic cirrhosis type, unspecified whether ascites present ([place])   8. Educated about COVID-19 virus infection    PLAN:    In order of problems listed above:  1. He appears to be in atrial flutter today with a faster heart rate than typical.  Increase Toprol-XL to 25 mg/day (12.5 mg twice daily), continue Eliquis, and will obtain a 7-day monitor to track heart rate response.  I will have him follow-up with me in approximately 4 to 6 weeks.  Earlier if he develops any symptoms. 2. No recurrence of neurological complaints. 3. Not assessed and on last echo there was no report of left  ventricular hypertrophy.  Depending upon findings from the monitor, an echo may need to be repeated to rule out tachycardia induced left ventricular systolic dysfunction. 4. No significant murmur is heard 5. No ventricular ectopy is noted. 6. Unchanged 7. Not discussed 8. W's discussed and is being applied to avoid COVID-19 infection.  Medication Adjustments/Labs and Tests Ordered: Current medicines are reviewed at length with the patient today.  Concerns regarding medicines are outlined above.  No orders of the defined types were placed in this encounter.  Meds ordered this encounter  Medications  . metoprolol succinate (TOPROL-XL) 25 MG 24 hr tablet    Sig: Take 0.5 tablets (12.5 mg total) by mouth 2 (two) times daily.    Dispense:  90 tablet    Refill:  3    Dose change    There are no Patient Instructions on file for this visit.   Signed, Sinclair Grooms, MD  05/06/2019 5:12 PM    [place]

## 2019-05-06 ENCOUNTER — Other Ambulatory Visit: Payer: Self-pay

## 2019-05-06 ENCOUNTER — Ambulatory Visit (INDEPENDENT_AMBULATORY_CARE_PROVIDER_SITE_OTHER): Payer: Medicare HMO | Admitting: Interventional Cardiology

## 2019-05-06 ENCOUNTER — Encounter: Payer: Self-pay | Admitting: Interventional Cardiology

## 2019-05-06 VITALS — BP 116/82 | HR 114 | Ht 73.0 in | Wt 189.1 lb

## 2019-05-06 DIAGNOSIS — I4819 Other persistent atrial fibrillation: Secondary | ICD-10-CM

## 2019-05-06 DIAGNOSIS — K7460 Unspecified cirrhosis of liver: Secondary | ICD-10-CM | POA: Diagnosis not present

## 2019-05-06 DIAGNOSIS — I517 Cardiomegaly: Secondary | ICD-10-CM

## 2019-05-06 DIAGNOSIS — G453 Amaurosis fugax: Secondary | ICD-10-CM

## 2019-05-06 DIAGNOSIS — I493 Ventricular premature depolarization: Secondary | ICD-10-CM

## 2019-05-06 DIAGNOSIS — I447 Left bundle-branch block, unspecified: Secondary | ICD-10-CM

## 2019-05-06 DIAGNOSIS — I351 Nonrheumatic aortic (valve) insufficiency: Secondary | ICD-10-CM

## 2019-05-06 DIAGNOSIS — Z7189 Other specified counseling: Secondary | ICD-10-CM

## 2019-05-06 MED ORDER — METOPROLOL SUCCINATE ER 25 MG PO TB24: 12.5000 mg | ORAL_TABLET | Freq: Two times a day (BID) | ORAL | 3 refills | Status: AC

## 2019-05-06 MED ORDER — METOPROLOL SUCCINATE ER 25 MG PO TB24: 12.5000 mg | ORAL_TABLET | Freq: Two times a day (BID) | ORAL | 3 refills | Status: DC

## 2019-05-06 NOTE — Patient Instructions (Signed)
Medication Instructions:  1) INCREASE Metoprolol Succinate to 12.47m twice daily  *If you need a refill on your cardiac medications before your next appointment, please call your pharmacy*  Lab Work: None If you have labs (blood work) drawn today and your tests are completely normal, you will receive your results only by: .Marland KitchenMyChart Message (if you have MyChart) OR . A paper copy in the mail If you have any lab test that is abnormal or we need to change your treatment, we will call you to review the results.  Testing/Procedures: Your physician recommends that you wear a monitor for 7 days.   Follow-Up: [facility] and your health needs are our priority.  As part of our continuing mission to provide you with exceptional heart care, we have created designated Provider Care Teams.  These Care Teams include your primary Cardiologist (physician) and Advanced Practice Providers (APPs -  Physician Assistants and Nurse Practitioners) who all work together to provide you with the care you need, when you need it.  Your next appointment:   4-6 week(s)  The format for your next appointment:   In Person  Provider:   You may see HSinclair Grooms MD or one of the following Advanced Practice Providers on your designated Care Team:    LTruitt Merle NP  LCecilie Kicks NP  JKathyrn Drown NP   Other Instructions

## 2019-05-07 ENCOUNTER — Telehealth: Payer: Self-pay | Admitting: Radiology

## 2019-05-07 NOTE — Telephone Encounter (Signed)
Enrolled patient for a 7 day Zio monitor to be mailed to patients home.  

## 2019-05-10 NOTE — Addendum Note (Signed)
Addended by: Carylon Perches on: 05/10/2019 01:16 PM   Modules accepted: Orders

## 2019-05-12 ENCOUNTER — Emergency Department (HOSPITAL_COMMUNITY)
Admission: EM | Admit: 2019-05-12 | Discharge: 2019-05-13 | Discharge status: Against Medical Advice | Payer: Medicare HMO | Source: Home / Self Care

## 2019-05-12 ENCOUNTER — Encounter (HOSPITAL_COMMUNITY): Payer: Self-pay

## 2019-05-12 ENCOUNTER — Other Ambulatory Visit: Payer: Self-pay

## 2019-05-12 DIAGNOSIS — I4891 Unspecified atrial fibrillation: Secondary | ICD-10-CM | POA: Diagnosis not present

## 2019-05-12 DIAGNOSIS — D631 Anemia in chronic kidney disease: Secondary | ICD-10-CM | POA: Diagnosis not present

## 2019-05-12 DIAGNOSIS — K259 Gastric ulcer, unspecified as acute or chronic, without hemorrhage or perforation: Secondary | ICD-10-CM | POA: Diagnosis not present

## 2019-05-12 DIAGNOSIS — R0600 Dyspnea, unspecified: Secondary | ICD-10-CM | POA: Diagnosis not present

## 2019-05-12 DIAGNOSIS — R6510 Systemic inflammatory response syndrome (SIRS) of non-infectious origin without acute organ dysfunction: Secondary | ICD-10-CM | POA: Diagnosis present

## 2019-05-12 DIAGNOSIS — N170 Acute kidney failure with tubular necrosis: Secondary | ICD-10-CM | POA: Diagnosis present

## 2019-05-12 DIAGNOSIS — I4819 Other persistent atrial fibrillation: Secondary | ICD-10-CM | POA: Diagnosis present

## 2019-05-12 DIAGNOSIS — J918 Pleural effusion in other conditions classified elsewhere: Secondary | ICD-10-CM | POA: Diagnosis present

## 2019-05-12 DIAGNOSIS — H53122 Transient visual loss, left eye: Secondary | ICD-10-CM | POA: Diagnosis not present

## 2019-05-12 DIAGNOSIS — N179 Acute kidney failure, unspecified: Secondary | ICD-10-CM | POA: Diagnosis not present

## 2019-05-12 DIAGNOSIS — Y95 Nosocomial condition: Secondary | ICD-10-CM | POA: Diagnosis not present

## 2019-05-12 DIAGNOSIS — J159 Unspecified bacterial pneumonia: Secondary | ICD-10-CM | POA: Diagnosis not present

## 2019-05-12 DIAGNOSIS — J90 Pleural effusion, not elsewhere classified: Secondary | ICD-10-CM | POA: Diagnosis not present

## 2019-05-12 DIAGNOSIS — Z4682 Encounter for fitting and adjustment of non-vascular catheter: Secondary | ICD-10-CM | POA: Diagnosis not present

## 2019-05-12 DIAGNOSIS — I9589 Other hypotension: Secondary | ICD-10-CM | POA: Diagnosis not present

## 2019-05-12 DIAGNOSIS — I5032 Chronic diastolic (congestive) heart failure: Secondary | ICD-10-CM | POA: Diagnosis present

## 2019-05-12 DIAGNOSIS — Z20822 Contact with and (suspected) exposure to covid-19: Secondary | ICD-10-CM | POA: Diagnosis present

## 2019-05-12 DIAGNOSIS — I8501 Esophageal varices with bleeding: Secondary | ICD-10-CM | POA: Diagnosis not present

## 2019-05-12 DIAGNOSIS — K767 Hepatorenal syndrome: Secondary | ICD-10-CM | POA: Diagnosis not present

## 2019-05-12 DIAGNOSIS — E875 Hyperkalemia: Secondary | ICD-10-CM | POA: Diagnosis not present

## 2019-05-12 DIAGNOSIS — K769 Liver disease, unspecified: Secondary | ICD-10-CM | POA: Diagnosis not present

## 2019-05-12 DIAGNOSIS — R6521 Severe sepsis with septic shock: Secondary | ICD-10-CM | POA: Diagnosis present

## 2019-05-12 DIAGNOSIS — H547 Unspecified visual loss: Secondary | ICD-10-CM | POA: Diagnosis not present

## 2019-05-12 DIAGNOSIS — D500 Iron deficiency anemia secondary to blood loss (chronic): Secondary | ICD-10-CM | POA: Diagnosis not present

## 2019-05-12 DIAGNOSIS — J948 Other specified pleural conditions: Secondary | ICD-10-CM | POA: Diagnosis not present

## 2019-05-12 DIAGNOSIS — D62 Acute posthemorrhagic anemia: Secondary | ICD-10-CM | POA: Diagnosis not present

## 2019-05-12 DIAGNOSIS — K269 Duodenal ulcer, unspecified as acute or chronic, without hemorrhage or perforation: Secondary | ICD-10-CM | POA: Diagnosis not present

## 2019-05-12 DIAGNOSIS — Z515 Encounter for palliative care: Secondary | ICD-10-CM | POA: Diagnosis not present

## 2019-05-12 DIAGNOSIS — R197 Diarrhea, unspecified: Secondary | ICD-10-CM | POA: Diagnosis not present

## 2019-05-12 DIAGNOSIS — K7200 Acute and subacute hepatic failure without coma: Secondary | ICD-10-CM | POA: Diagnosis not present

## 2019-05-12 DIAGNOSIS — J9811 Atelectasis: Secondary | ICD-10-CM | POA: Diagnosis present

## 2019-05-12 DIAGNOSIS — I4811 Longstanding persistent atrial fibrillation: Secondary | ICD-10-CM | POA: Diagnosis not present

## 2019-05-12 DIAGNOSIS — R918 Other nonspecific abnormal finding of lung field: Secondary | ICD-10-CM | POA: Diagnosis not present

## 2019-05-12 DIAGNOSIS — E872 Acidosis: Secondary | ICD-10-CM | POA: Diagnosis present

## 2019-05-12 DIAGNOSIS — E8770 Fluid overload, unspecified: Secondary | ICD-10-CM | POA: Diagnosis not present

## 2019-05-12 DIAGNOSIS — R0602 Shortness of breath: Secondary | ICD-10-CM | POA: Diagnosis not present

## 2019-05-12 DIAGNOSIS — A419 Sepsis, unspecified organism: Secondary | ICD-10-CM | POA: Diagnosis not present

## 2019-05-12 DIAGNOSIS — K859 Acute pancreatitis without necrosis or infection, unspecified: Secondary | ICD-10-CM | POA: Diagnosis present

## 2019-05-12 DIAGNOSIS — I129 Hypertensive chronic kidney disease with stage 1 through stage 4 chronic kidney disease, or unspecified chronic kidney disease: Secondary | ICD-10-CM | POA: Diagnosis not present

## 2019-05-12 DIAGNOSIS — Z9889 Other specified postprocedural states: Secondary | ICD-10-CM | POA: Diagnosis not present

## 2019-05-12 DIAGNOSIS — K264 Chronic or unspecified duodenal ulcer with hemorrhage: Secondary | ICD-10-CM | POA: Diagnosis not present

## 2019-05-12 DIAGNOSIS — Z66 Do not resuscitate: Secondary | ICD-10-CM | POA: Diagnosis not present

## 2019-05-12 DIAGNOSIS — R6520 Severe sepsis without septic shock: Secondary | ICD-10-CM | POA: Diagnosis not present

## 2019-05-12 DIAGNOSIS — R188 Other ascites: Secondary | ICD-10-CM | POA: Diagnosis present

## 2019-05-12 DIAGNOSIS — K922 Gastrointestinal hemorrhage, unspecified: Secondary | ICD-10-CM | POA: Diagnosis not present

## 2019-05-12 DIAGNOSIS — Z5321 Procedure and treatment not carried out due to patient leaving prior to being seen by health care provider: Secondary | ICD-10-CM | POA: Insufficient documentation

## 2019-05-12 DIAGNOSIS — I340 Nonrheumatic mitral (valve) insufficiency: Secondary | ICD-10-CM | POA: Diagnosis not present

## 2019-05-12 DIAGNOSIS — K652 Spontaneous bacterial peritonitis: Secondary | ICD-10-CM | POA: Diagnosis present

## 2019-05-12 DIAGNOSIS — Z452 Encounter for adjustment and management of vascular access device: Secondary | ICD-10-CM | POA: Diagnosis not present

## 2019-05-12 DIAGNOSIS — K921 Melena: Secondary | ICD-10-CM | POA: Diagnosis not present

## 2019-05-12 DIAGNOSIS — J810 Acute pulmonary edema: Secondary | ICD-10-CM | POA: Diagnosis present

## 2019-05-12 DIAGNOSIS — E861 Hypovolemia: Secondary | ICD-10-CM | POA: Diagnosis not present

## 2019-05-12 DIAGNOSIS — J9602 Acute respiratory failure with hypercapnia: Secondary | ICD-10-CM | POA: Diagnosis not present

## 2019-05-12 DIAGNOSIS — K7460 Unspecified cirrhosis of liver: Secondary | ICD-10-CM | POA: Diagnosis not present

## 2019-05-12 DIAGNOSIS — I361 Nonrheumatic tricuspid (valve) insufficiency: Secondary | ICD-10-CM | POA: Diagnosis not present

## 2019-05-12 DIAGNOSIS — J9601 Acute respiratory failure with hypoxia: Secondary | ICD-10-CM | POA: Diagnosis present

## 2019-05-12 MED ORDER — SODIUM CHLORIDE 0.9% FLUSH: 3.0000 mL | Freq: Once | INTRAVENOUS | Status: DC

## 2019-05-12 NOTE — ED Triage Notes (Signed)
Pt states that he is having abd pain, n/v and generalized weakness, pt reports that he got his first covid vaccine today

## 2019-05-13 LAB — COMPREHENSIVE METABOLIC PANEL
ALT: 28 U/L (ref 0–44)
AST: 57 U/L — ABNORMAL HIGH (ref 15–41)
Albumin: 3.5 g/dL (ref 3.5–5.0)
Alkaline Phosphatase: 60 U/L (ref 38–126)
Anion gap: 11 (ref 5–15)
BUN: 25 mg/dL — ABNORMAL HIGH (ref 8–23)
CO2: 26 mmol/L (ref 22–32)
Calcium: 9.5 mg/dL (ref 8.9–10.3)
Chloride: 104 mmol/L (ref 98–111)
Creatinine, Ser: 1.42 mg/dL — ABNORMAL HIGH (ref 0.61–1.24)
GFR calc Af Amer: 54 mL/min — ABNORMAL LOW (ref >60)
GFR calc non Af Amer: 47 mL/min — ABNORMAL LOW (ref >60)
Glucose, Bld: 168 mg/dL — ABNORMAL HIGH (ref 70–99)
Potassium: 4.1 mmol/L (ref 3.5–5.1)
Sodium: 141 mmol/L (ref 135–145)
Total Bilirubin: 1.9 mg/dL — ABNORMAL HIGH (ref 0.3–1.2)
Total Protein: 6.8 g/dL (ref 6.5–8.1)

## 2019-05-13 LAB — CBC
HCT: 46.7 % (ref 39.0–52.0)
Hemoglobin: 15.9 g/dL (ref 13.0–17.0)
MCH: 35.7 pg — ABNORMAL HIGH (ref 26.0–34.0)
MCHC: 34 g/dL (ref 30.0–36.0)
MCV: 104.9 fL — ABNORMAL HIGH (ref 80.0–100.0)
Platelets: 218 10*3/uL (ref 150–400)
RBC: 4.45 MIL/uL (ref 4.22–5.81)
RDW: 12.7 % (ref 11.5–15.5)
WBC: 14.6 10*3/uL — ABNORMAL HIGH (ref 4.0–10.5)
nRBC: 0 % (ref 0.0–0.2)

## 2019-05-13 LAB — LIPASE, BLOOD: Lipase: 2501 U/L — ABNORMAL HIGH (ref 11–51)

## 2019-05-13 NOTE — ED Notes (Signed)
Pt going home, stating that he wants to go home and lay down

## 2019-05-14 ENCOUNTER — Emergency Department (HOSPITAL_COMMUNITY): Payer: Medicare HMO

## 2019-05-14 ENCOUNTER — Inpatient Hospital Stay (HOSPITAL_COMMUNITY)
Admission: EM | Admit: 2019-05-14 | Discharge: 2019-05-31 | DRG: 871 | Disposition: E | Payer: Medicare HMO | Attending: Pulmonary Disease | Admitting: Pulmonary Disease

## 2019-05-14 ENCOUNTER — Other Ambulatory Visit: Payer: Self-pay

## 2019-05-14 DIAGNOSIS — Z20822 Contact with and (suspected) exposure to covid-19: Secondary | ICD-10-CM | POA: Diagnosis present

## 2019-05-14 DIAGNOSIS — E860 Dehydration: Secondary | ICD-10-CM | POA: Diagnosis present

## 2019-05-14 DIAGNOSIS — K652 Spontaneous bacterial peritonitis: Secondary | ICD-10-CM | POA: Diagnosis present

## 2019-05-14 DIAGNOSIS — I5032 Chronic diastolic (congestive) heart failure: Secondary | ICD-10-CM | POA: Diagnosis present

## 2019-05-14 DIAGNOSIS — J986 Disorders of diaphragm: Secondary | ICD-10-CM | POA: Diagnosis present

## 2019-05-14 DIAGNOSIS — Y95 Nosocomial condition: Secondary | ICD-10-CM | POA: Diagnosis not present

## 2019-05-14 DIAGNOSIS — A419 Sepsis, unspecified organism: Secondary | ICD-10-CM | POA: Diagnosis present

## 2019-05-14 DIAGNOSIS — Z7901 Long term (current) use of anticoagulants: Secondary | ICD-10-CM

## 2019-05-14 DIAGNOSIS — E8770 Fluid overload, unspecified: Secondary | ICD-10-CM

## 2019-05-14 DIAGNOSIS — R0600 Dyspnea, unspecified: Secondary | ICD-10-CM | POA: Diagnosis not present

## 2019-05-14 DIAGNOSIS — K259 Gastric ulcer, unspecified as acute or chronic, without hemorrhage or perforation: Secondary | ICD-10-CM | POA: Diagnosis present

## 2019-05-14 DIAGNOSIS — N1831 Chronic kidney disease, stage 3a: Secondary | ICD-10-CM | POA: Diagnosis present

## 2019-05-14 DIAGNOSIS — Z79899 Other long term (current) drug therapy: Secondary | ICD-10-CM

## 2019-05-14 DIAGNOSIS — R091 Pleurisy: Secondary | ICD-10-CM | POA: Diagnosis present

## 2019-05-14 DIAGNOSIS — Z825 Family history of asthma and other chronic lower respiratory diseases: Secondary | ICD-10-CM

## 2019-05-14 DIAGNOSIS — Z66 Do not resuscitate: Secondary | ICD-10-CM | POA: Diagnosis not present

## 2019-05-14 DIAGNOSIS — N261 Atrophy of kidney (terminal): Secondary | ICD-10-CM | POA: Diagnosis present

## 2019-05-14 DIAGNOSIS — K769 Liver disease, unspecified: Secondary | ICD-10-CM | POA: Diagnosis not present

## 2019-05-14 DIAGNOSIS — Z888 Allergy status to other drugs, medicaments and biological substances status: Secondary | ICD-10-CM

## 2019-05-14 DIAGNOSIS — F419 Anxiety disorder, unspecified: Secondary | ICD-10-CM | POA: Diagnosis present

## 2019-05-14 DIAGNOSIS — K767 Hepatorenal syndrome: Secondary | ICD-10-CM | POA: Diagnosis not present

## 2019-05-14 DIAGNOSIS — K7460 Unspecified cirrhosis of liver: Secondary | ICD-10-CM

## 2019-05-14 DIAGNOSIS — R6521 Severe sepsis with septic shock: Secondary | ICD-10-CM | POA: Diagnosis present

## 2019-05-14 DIAGNOSIS — D62 Acute posthemorrhagic anemia: Secondary | ICD-10-CM | POA: Diagnosis not present

## 2019-05-14 DIAGNOSIS — J9601 Acute respiratory failure with hypoxia: Secondary | ICD-10-CM | POA: Diagnosis present

## 2019-05-14 DIAGNOSIS — Z9889 Other specified postprocedural states: Secondary | ICD-10-CM | POA: Diagnosis not present

## 2019-05-14 DIAGNOSIS — K7200 Acute and subacute hepatic failure without coma: Secondary | ICD-10-CM | POA: Diagnosis not present

## 2019-05-14 DIAGNOSIS — N170 Acute kidney failure with tubular necrosis: Secondary | ICD-10-CM | POA: Diagnosis present

## 2019-05-14 DIAGNOSIS — K859 Acute pancreatitis without necrosis or infection, unspecified: Secondary | ICD-10-CM | POA: Diagnosis present

## 2019-05-14 DIAGNOSIS — I8501 Esophageal varices with bleeding: Secondary | ICD-10-CM | POA: Diagnosis not present

## 2019-05-14 DIAGNOSIS — J302 Other seasonal allergic rhinitis: Secondary | ICD-10-CM | POA: Diagnosis present

## 2019-05-14 DIAGNOSIS — Z515 Encounter for palliative care: Secondary | ICD-10-CM | POA: Diagnosis not present

## 2019-05-14 DIAGNOSIS — I4891 Unspecified atrial fibrillation: Secondary | ICD-10-CM | POA: Diagnosis not present

## 2019-05-14 DIAGNOSIS — R578 Other shock: Secondary | ICD-10-CM | POA: Diagnosis not present

## 2019-05-14 DIAGNOSIS — I129 Hypertensive chronic kidney disease with stage 1 through stage 4 chronic kidney disease, or unspecified chronic kidney disease: Secondary | ICD-10-CM | POA: Diagnosis present

## 2019-05-14 DIAGNOSIS — J159 Unspecified bacterial pneumonia: Secondary | ICD-10-CM | POA: Diagnosis not present

## 2019-05-14 DIAGNOSIS — Z8249 Family history of ischemic heart disease and other diseases of the circulatory system: Secondary | ICD-10-CM

## 2019-05-14 DIAGNOSIS — R34 Anuria and oliguria: Secondary | ICD-10-CM | POA: Diagnosis not present

## 2019-05-14 DIAGNOSIS — R188 Other ascites: Secondary | ICD-10-CM | POA: Diagnosis present

## 2019-05-14 DIAGNOSIS — J90 Pleural effusion, not elsewhere classified: Secondary | ICD-10-CM

## 2019-05-14 DIAGNOSIS — I351 Nonrheumatic aortic (valve) insufficiency: Secondary | ICD-10-CM | POA: Diagnosis present

## 2019-05-14 DIAGNOSIS — Z8711 Personal history of peptic ulcer disease: Secondary | ICD-10-CM

## 2019-05-14 DIAGNOSIS — K264 Chronic or unspecified duodenal ulcer with hemorrhage: Secondary | ICD-10-CM | POA: Diagnosis not present

## 2019-05-14 DIAGNOSIS — R197 Diarrhea, unspecified: Secondary | ICD-10-CM | POA: Diagnosis not present

## 2019-05-14 DIAGNOSIS — J918 Pleural effusion in other conditions classified elsewhere: Secondary | ICD-10-CM | POA: Diagnosis present

## 2019-05-14 DIAGNOSIS — R17 Unspecified jaundice: Secondary | ICD-10-CM

## 2019-05-14 DIAGNOSIS — E872 Acidosis: Secondary | ICD-10-CM | POA: Diagnosis present

## 2019-05-14 DIAGNOSIS — H53122 Transient visual loss, left eye: Secondary | ICD-10-CM | POA: Diagnosis not present

## 2019-05-14 DIAGNOSIS — R6520 Severe sepsis without septic shock: Secondary | ICD-10-CM | POA: Diagnosis not present

## 2019-05-14 DIAGNOSIS — N179 Acute kidney failure, unspecified: Secondary | ICD-10-CM

## 2019-05-14 DIAGNOSIS — K7581 Nonalcoholic steatohepatitis (NASH): Secondary | ICD-10-CM | POA: Diagnosis present

## 2019-05-14 DIAGNOSIS — E861 Hypovolemia: Secondary | ICD-10-CM | POA: Diagnosis not present

## 2019-05-14 DIAGNOSIS — I4819 Other persistent atrial fibrillation: Secondary | ICD-10-CM | POA: Diagnosis present

## 2019-05-14 DIAGNOSIS — R6510 Systemic inflammatory response syndrome (SIRS) of non-infectious origin without acute organ dysfunction: Secondary | ICD-10-CM

## 2019-05-14 DIAGNOSIS — I9589 Other hypotension: Secondary | ICD-10-CM | POA: Diagnosis not present

## 2019-05-14 DIAGNOSIS — D684 Acquired coagulation factor deficiency: Secondary | ICD-10-CM | POA: Diagnosis present

## 2019-05-14 DIAGNOSIS — I447 Left bundle-branch block, unspecified: Secondary | ICD-10-CM | POA: Diagnosis present

## 2019-05-14 DIAGNOSIS — Z833 Family history of diabetes mellitus: Secondary | ICD-10-CM

## 2019-05-14 DIAGNOSIS — Z452 Encounter for adjustment and management of vascular access device: Secondary | ICD-10-CM

## 2019-05-14 DIAGNOSIS — J810 Acute pulmonary edema: Secondary | ICD-10-CM | POA: Diagnosis present

## 2019-05-14 DIAGNOSIS — J9811 Atelectasis: Secondary | ICD-10-CM | POA: Diagnosis present

## 2019-05-14 DIAGNOSIS — I4811 Longstanding persistent atrial fibrillation: Secondary | ICD-10-CM | POA: Diagnosis not present

## 2019-05-14 DIAGNOSIS — Z978 Presence of other specified devices: Secondary | ICD-10-CM

## 2019-05-14 DIAGNOSIS — M109 Gout, unspecified: Secondary | ICD-10-CM | POA: Diagnosis present

## 2019-05-14 DIAGNOSIS — K2900 Acute gastritis without bleeding: Secondary | ICD-10-CM | POA: Diagnosis present

## 2019-05-14 DIAGNOSIS — K922 Gastrointestinal hemorrhage, unspecified: Secondary | ICD-10-CM | POA: Diagnosis not present

## 2019-05-14 DIAGNOSIS — F329 Major depressive disorder, single episode, unspecified: Secondary | ICD-10-CM | POA: Diagnosis present

## 2019-05-14 DIAGNOSIS — J9602 Acute respiratory failure with hypercapnia: Secondary | ICD-10-CM | POA: Diagnosis not present

## 2019-05-14 DIAGNOSIS — E875 Hyperkalemia: Secondary | ICD-10-CM

## 2019-05-14 DIAGNOSIS — I340 Nonrheumatic mitral (valve) insufficiency: Secondary | ICD-10-CM | POA: Diagnosis not present

## 2019-05-14 DIAGNOSIS — K529 Noninfective gastroenteritis and colitis, unspecified: Secondary | ICD-10-CM | POA: Diagnosis present

## 2019-05-14 DIAGNOSIS — M720 Palmar fascial fibromatosis [Dupuytren]: Secondary | ICD-10-CM | POA: Diagnosis present

## 2019-05-14 DIAGNOSIS — Q268 Other congenital malformations of great veins: Secondary | ICD-10-CM

## 2019-05-14 DIAGNOSIS — Z4659 Encounter for fitting and adjustment of other gastrointestinal appliance and device: Secondary | ICD-10-CM

## 2019-05-14 DIAGNOSIS — Z87891 Personal history of nicotine dependence: Secondary | ICD-10-CM

## 2019-05-14 DIAGNOSIS — I361 Nonrheumatic tricuspid (valve) insufficiency: Secondary | ICD-10-CM | POA: Diagnosis not present

## 2019-05-14 HISTORY — DX: Unspecified cirrhosis of liver: K74.60

## 2019-05-14 LAB — COMPREHENSIVE METABOLIC PANEL
ALT: 24 U/L (ref 0–44)
AST: 52 U/L — ABNORMAL HIGH (ref 15–41)
Albumin: 3 g/dL — ABNORMAL LOW (ref 3.5–5.0)
Alkaline Phosphatase: 48 U/L (ref 38–126)
Anion gap: 13 (ref 5–15)
BUN: 58 mg/dL — ABNORMAL HIGH (ref 8–23)
CO2: 23 mmol/L (ref 22–32)
Calcium: 7.3 mg/dL — ABNORMAL LOW (ref 8.9–10.3)
Chloride: 101 mmol/L (ref 98–111)
Creatinine, Ser: 2.69 mg/dL — ABNORMAL HIGH (ref 0.61–1.24)
GFR calc Af Amer: 25 mL/min — ABNORMAL LOW (ref >60)
GFR calc non Af Amer: 22 mL/min — ABNORMAL LOW (ref >60)
Glucose, Bld: 104 mg/dL — ABNORMAL HIGH (ref 70–99)
Potassium: 5.5 mmol/L — ABNORMAL HIGH (ref 3.5–5.1)
Sodium: 137 mmol/L (ref 135–145)
Total Bilirubin: 3.4 mg/dL — ABNORMAL HIGH (ref 0.3–1.2)
Total Protein: 6.3 g/dL — ABNORMAL LOW (ref 6.5–8.1)

## 2019-05-14 LAB — CBC WITH DIFFERENTIAL/PLATELET
Abs Immature Granulocytes: 0.29 10*3/uL — ABNORMAL HIGH (ref 0.00–0.07)
Basophils Absolute: 0 10*3/uL (ref 0.0–0.1)
Basophils Relative: 0 %
Eosinophils Absolute: 0 10*3/uL (ref 0.0–0.5)
Eosinophils Relative: 0 %
HCT: 50.9 % (ref 39.0–52.0)
Hemoglobin: 17.2 g/dL — ABNORMAL HIGH (ref 13.0–17.0)
Immature Granulocytes: 1 %
Lymphocytes Relative: 4 %
Lymphs Abs: 0.9 10*3/uL (ref 0.7–4.0)
MCH: 35.7 pg — ABNORMAL HIGH (ref 26.0–34.0)
MCHC: 33.8 g/dL (ref 30.0–36.0)
MCV: 105.6 fL — ABNORMAL HIGH (ref 80.0–100.0)
Monocytes Absolute: 1.7 10*3/uL — ABNORMAL HIGH (ref 0.1–1.0)
Monocytes Relative: 7 %
Neutro Abs: 21.9 10*3/uL — ABNORMAL HIGH (ref 1.7–7.7)
Neutrophils Relative %: 88 %
Platelets: 190 10*3/uL (ref 150–400)
RBC: 4.82 MIL/uL (ref 4.22–5.81)
RDW: 13.3 % (ref 11.5–15.5)
WBC: 24.8 10*3/uL — ABNORMAL HIGH (ref 4.0–10.5)
nRBC: 0 % (ref 0.0–0.2)

## 2019-05-14 LAB — RESPIRATORY PANEL BY RT PCR (FLU A&B, COVID)
Influenza A by PCR: NEGATIVE
Influenza B by PCR: NEGATIVE
SARS Coronavirus 2 by RT PCR: NEGATIVE

## 2019-05-14 LAB — PROTIME-INR
INR: 2.8 — ABNORMAL HIGH (ref 0.8–1.2)
Prothrombin Time: 29.1 seconds — ABNORMAL HIGH (ref 11.4–15.2)

## 2019-05-14 LAB — LIPASE, BLOOD: Lipase: 101 U/L — ABNORMAL HIGH (ref 11–51)

## 2019-05-14 LAB — LACTIC ACID, PLASMA
Lactic Acid, Venous: 4.3 mmol/L (ref 0.5–1.9)
Lactic Acid, Venous: 3.8 mmol/L (ref 0.5–1.9)

## 2019-05-14 LAB — APTT: aPTT: 40 seconds — ABNORMAL HIGH (ref 24–36)

## 2019-05-14 MED ORDER — SODIUM CHLORIDE 0.9 % IV SOLN
2.0000 g | INTRAVENOUS | Status: DC
  Administered 2019-05-15: 2 g via INTRAVENOUS
  Filled 2019-05-14 (×2): qty 2

## 2019-05-14 MED ORDER — SODIUM CHLORIDE 0.9 % IV BOLUS
30.0000 mL/kg | Freq: Once | INTRAVENOUS | Status: AC
  Administered 2019-05-14: 18:00:00 2574 mL via INTRAVENOUS

## 2019-05-14 MED ORDER — METRONIDAZOLE IN NACL 5-0.79 MG/ML-% IV SOLN
500.0000 mg | Freq: Once | INTRAVENOUS | Status: AC
  Administered 2019-05-14: 14:00:00 500 mg via INTRAVENOUS
  Filled 2019-05-14: qty 100

## 2019-05-14 MED ORDER — SODIUM CHLORIDE 0.9 % IV SOLN
2.0000 g | Freq: Once | INTRAVENOUS | Status: AC
  Administered 2019-05-14: 2 g via INTRAVENOUS
  Filled 2019-05-14: qty 2

## 2019-05-14 MED ORDER — METRONIDAZOLE IN NACL 5-0.79 MG/ML-% IV SOLN
500.0000 mg | Freq: Three times a day (TID) | INTRAVENOUS | Status: AC
  Administered 2019-05-14 – 2019-05-24 (×30): 500 mg via INTRAVENOUS
  Filled 2019-05-14 (×31): qty 100

## 2019-05-14 MED ORDER — SODIUM CHLORIDE 0.9 % IV BOLUS
1000.0000 mL | Freq: Once | INTRAVENOUS | Status: AC
  Administered 2019-05-14: 1000 mL via INTRAVENOUS

## 2019-05-14 MED ORDER — VANCOMYCIN VARIABLE DOSE PER UNSTABLE RENAL FUNCTION (PHARMACIST DOSING): Status: DC

## 2019-05-14 MED ORDER — ONDANSETRON HCL 4 MG/2ML IJ SOLN
4.0000 mg | Freq: Four times a day (QID) | INTRAMUSCULAR | Status: DC | PRN
  Administered 2019-05-15: 4 mg via INTRAVENOUS
  Filled 2019-05-14 (×2): qty 2

## 2019-05-14 MED ORDER — ACETAMINOPHEN 650 MG RE SUPP: 650.0000 mg | Freq: Four times a day (QID) | RECTAL | Status: DC | PRN

## 2019-05-14 MED ORDER — VANCOMYCIN HCL IN DEXTROSE 1-5 GM/200ML-% IV SOLN: 1000.0000 mg | Freq: Once | INTRAVENOUS | Status: DC

## 2019-05-14 MED ORDER — SODIUM CHLORIDE 0.9 % IV SOLN: INTRAVENOUS | Status: DC

## 2019-05-14 MED ORDER — ACETAMINOPHEN 325 MG PO TABS
650.0000 mg | ORAL_TABLET | Freq: Four times a day (QID) | ORAL | Status: DC | PRN
  Administered 2019-05-16 – 2019-05-23 (×11): 650 mg via ORAL
  Filled 2019-05-14 (×12): qty 2

## 2019-05-14 MED ORDER — VANCOMYCIN HCL 1750 MG/350ML IV SOLN
1750.0000 mg | Freq: Once | INTRAVENOUS | Status: AC
  Administered 2019-05-14: 1750 mg via INTRAVENOUS
  Filled 2019-05-14: qty 350

## 2019-05-14 MED ORDER — SODIUM CHLORIDE 0.9 % IV BOLUS
1000.0000 mL | Freq: Once | INTRAVENOUS | Status: AC
  Administered 2019-05-14: 22:00:00 1000 mL via INTRAVENOUS

## 2019-05-14 NOTE — Progress Notes (Signed)
Pharmacy Antibiotic Note  Richard Saunders is a 79 y.o. male admitted on 05/09/2019 with sepsis.  Pharmacy has been consulted for vancomycin and cefepime dosing. Pt is afebrile but WBC is significantly elevated at 24.8. SCr is also elevated at 2.69 which is up significantly from yesterday.   Plan: Vancomycin 1739m IV x 1 then trend Scr for further doses Cefepime 2gm IV Q24H F/u renal fxn, C&S, clinical status and peak/trough at SS  Height: 6' 1"  (185.4 cm) Weight: 189 lb 1.9 oz (85.8 kg) IBW/kg (Calculated) : 79.9  Temp (24hrs), Avg:97.9 F (36.6 C), Min:97.9 F (36.6 C), Max:97.9 F (36.6 C)  Recent Labs  Lab 05/13/19 0002 05/22/2019 1334  WBC 14.6* 24.8*  CREATININE 1.42* 2.69*    Estimated Creatinine Clearance: 48.5 mL/min (A) (by C-G formula based on SCr of 1.42 mg/dL (H)).    Allergies  Allergen Reactions  . Colchicine Diarrhea  . Fluticasone Propionate Other (See Comments)    He didn't tolerate the taste/smell prev  . Nasacort [Triamcinolone] Other (See Comments)    Didn't tolerate    Antimicrobials this admission: Vanc 1/15>> Cefepime 1/15>> Flagyl x 1 1/15  Dose adjustments this admission: N/A  Microbiology results: Pending  Thank you for allowing pharmacy to be a part of this patient's care.  Richard Saunders, RRande Lawman1/15/2021 2:00 PM

## 2019-05-14 NOTE — H&P (Signed)
History and Physical    Richard Saunders NAT:[id number] DOB: 1941/03/03 DOA: 05/01/2019  PCP: Tonia Ghent, MD Patient coming from: Home  Chief Complaint: Generalized weakness  HPI: Richard Saunders is a 79 y.o. male with medical history significant of aortic insufficiency, A. fib on Eliquis, hypertension, LBBB, PVCs, nonalcoholic liver cirrhosis, history of ascites status post IR paracentesis, anxiety, depression presenting to the ED for evaluation of generalized weakness.  He was in the ED on 1/13 for evaluation of abdominal pain and emesis. Labs were drawn but patient left before being seen by a provider.  Labs returned showing elevated lipase of 2501 and he was advised to come back into the ED.  Patient received his first Covid vaccine on 1/13. Patient states since he received his vaccine 2 days ago he has not been feeling well.  He is having generalized weakness, fatigue, chills, abdominal cramps, nausea, vomiting, and diarrhea.  Since he has been feeling sick he has not taken his diuretics and lactulose for the past 2 days.  Denies cough, shortness of breath, or chest pain.  Denies dysuria.  He has not urinated much for the past 2 days.   ED Course: Afebrile.  Tachycardic with heart rate in the 120s and tachypneic with respiratory rate in the low 20s.  Hypotensive on arrival with blood pressure 85/67.  Not hypoxic.  Labs showing leukocytosis (WBC count 24.8 with left shift).  Lactic acid 4.3.  Potassium 5.5.  BUN 58, creatinine 2.6.  Baseline creatinine 1.1-1.2.  Lipase 101.  T bili 3.4, AST 52.  T bili elevated on previous labs but now worse.  AST mildly elevated on previous labs as well.  ALT and alk phos normal.  INR 2.8.  UA and urine culture pending.  Blood culture x2 pending.  SARS-CoV-2 PCR test negative.  Chest x-ray personally reviewed showing mild left basilar atelectasis or infiltrate with associated pleural effusion.  CT abdomen pelvis showing advanced liver cirrhosis with  moderate amount of ascites, some edema of the peripancreatic fat concerning for low-level pancreatitis.  No advanced pancreatitis or pseudocyst.  Also showing bilateral pleural effusions left greater than right with associated left lower lobe atelectasis.  Bedside ultrasound done in the ED revealed small volume ascites, no pocket large enough for diagnostic paracentesis. High-sensitivity troponin pending. Patient received broad-spectrum antibiotics including vancomycin, cefepime, and metronidazole. Received 3.5 L normal saline boluses.  Blood pressure improved after IV fluid resuscitation.  Repeat lactate after fluids 3.8.  Review of Systems:  All systems reviewed and apart from history of presenting illness, are negative.  Past Medical History:  Diagnosis Date  . Allergy   . Anxiety   . Aortic insufficiency    a. 04/2018 Echo: EF 55-60%. Mild to Mod MR. Mild AI/TR.  [place] Atrial fibrillation ([place])    a. Dx 04/2018. CHA2DS2VASc = 4-->Eliquis.  . Depression   . H/O: rheumatic fever 2012   (per patient) - eval by [facility] LBBB with PVC's  . History of chicken pox   . Hypertension   . LBBB (left bundle branch block)   . Organic impotence   . PVC's (premature ventricular contractions)     Past Surgical History:  Procedure Laterality Date  . Cautery for nosebleeds    . IR PARACENTESIS  06/19/2018  . Right elbow bursa removed    . TONSILLECTOMY AND ADENOIDECTOMY  1947     reports that he quit smoking about 41 years ago. He quit after 40.00 years of use.  He has never used smokeless tobacco. He reports previous alcohol use. He reports that he does not use drugs.  Allergies  Allergen Reactions  . Colchicine Diarrhea  . Fluticasone Propionate Other (See Comments)    He didn't tolerate the taste/smell prev  . Nasacort [Triamcinolone] Other (See Comments)    Didn't tolerate    Family History  Problem Relation Age of Onset  . Cancer Mother        ovarian, uterine  . Aneurysm Mother         AAA in 34  . Cancer Father        Prostate  . COPD Father   . Prostate cancer Father   . Diabetes Other   . Heart disease Other        CAD <60 male  . Hypertension Other   . Heart disease Maternal Grandfather        died from possible "heart attack" in early 40's  . Heart failure Maternal Uncle   . Colon cancer Neg Hx     Prior to Admission medications   Medication Sig Start Date End Date Taking? Authorizing Provider  ALPRAZolam Duanne Moron) 0.25 MG tablet Take 0.5 tablets (0.125 mg total) by mouth daily as needed for anxiety. 01/27/18   Tonia Ghent, MD  apixaban (ELIQUIS) 5 MG TABS tablet Take 1 tablet (5 mg total) by mouth 2 (two) times daily. 06/02/18 06-27-19  Fenton, Clint R, PA  cromolyn (OPTICROM) 4 % ophthalmic solution Place 2 drops into both eyes every morning.  05/31/15   [provider]  furosemide (LASIX) 20 MG tablet Take 20 mg by mouth daily.    [provider]  lactulose (CHRONULAC) 10 GM/15ML solution Takes 10 ml 2 times daily. 07/29/18   [provider]  metoprolol succinate (TOPROL-XL) 25 MG 24 hr tablet Take 0.5 tablets (12.5 mg total) by mouth 2 (two) times daily. 05/06/19   Belva Crome, MD  spironolactone (ALDACTONE) 50 MG tablet Take 1 tab a day if weight is >185 lbs. If weight 190-195, then take 1 tab twice a day.   If weight >195, then take 1 tab 3 times a day. 10/29/18   Tonia Ghent, MD  triamcinolone cream (KENALOG) 0.1 % Apply 1 application topically 2 (two) times daily as needed. 10/09/18   Tonia Ghent, MD    Physical Exam: Vitals:   05/04/2019 1921 05/29/2019 1924 05/29/2019 1930 05/07/2019 2143  BP: 104/74   (!) 80/66  Pulse: (!) 109  95 (!) 123  Resp: 15  (!) 23 (!) 22  Temp:  (!) 97.4 F (36.3 C)  98 F (36.7 C)  TempSrc:    Oral  SpO2: 96%  96% 94%  Weight:    87.4 kg  Height:    6' 1"  (1.854 m)    Physical Exam  Constitutional: He is oriented to person, place, and time. He appears well-developed and well-nourished.    Rigors  HENT:  Head: Normocephalic.  Dry mucous membranes  Eyes: Right eye exhibits no discharge. Left eye exhibits no discharge.  Cardiovascular: Normal rate, regular rhythm and intact distal pulses.  Pulmonary/Chest: Effort normal and breath sounds normal. No respiratory distress. He has no wheezes. He has no rales.  Abdominal: Soft. Bowel sounds are normal. There is no abdominal tenderness. There is no rebound and no guarding.  Mild abdominal distention  Musculoskeletal:        General: No edema.     Cervical back: Neck supple.  Neurological: He is alert and oriented to person, place, and time.  Skin: Skin is warm and dry. He is not diaphoretic.  Psychiatric: He has a normal mood and affect. His behavior is normal.     Labs on Admission: I have personally reviewed following labs and imaging studies  CBC: Recent Labs  Lab 05/13/19 0002 05/10/2019 1334  WBC 14.6* 24.8*  NEUTROABS  --  21.9*  HGB 15.9 17.2*  HCT 46.7 50.9  MCV 104.9* 105.6*  PLT 218 767   Basic Metabolic Panel: Recent Labs  Lab 05/13/19 0002 05/07/2019 1334  NA 141 137  K 4.1 5.5*  CL 104 101  CO2 26 23  GLUCOSE 168* 104*  BUN 25* 58*  CREATININE 1.42* 2.69*  CALCIUM 9.5 7.3*   GFR: Estimated Creatinine Clearance: 25.6 mL/min (A) (by C-G formula based on SCr of 2.69 mg/dL (H)). Liver Function Tests: Recent Labs  Lab 05/13/19 0002 05/23/2019 1334  AST 57* 52*  ALT 28 24  ALKPHOS 60 48  BILITOT 1.9* 3.4*  PROT 6.8 6.3*  ALBUMIN 3.5 3.0*   Recent Labs  Lab 05/13/19 0002 05/06/2019 1334  LIPASE 2,501* 101*   No results for input(s): AMMONIA in the last 168 hours. Coagulation Profile: Recent Labs  Lab 05/23/2019 1410  INR 2.8*   Cardiac Enzymes: No results for input(s): CKTOTAL, CKMB, CKMBINDEX, TROPONINI in the last 168 hours. BNP (last 3 results) Recent Labs    06/05/18 1356 06/11/18 1644  PROBNP 116.0* 81.0   HbA1C: No results for input(s): HGBA1C in the last 72  hours. CBG: No results for input(s): GLUCAP in the last 168 hours. Lipid Profile: No results for input(s): CHOL, HDL, LDLCALC, TRIG, CHOLHDL, LDLDIRECT in the last 72 hours. Thyroid Function Tests: No results for input(s): TSH, T4TOTAL, FREET4, T3FREE, THYROIDAB in the last 72 hours. Anemia Panel: No results for input(s): VITAMINB12, FOLATE, FERRITIN, TIBC, IRON, RETICCTPCT in the last 72 hours. Urine analysis:    Component Value Date/Time   LABSPEC 1.020 07/25/2011 1403   PHURINE 6.0 07/25/2011 1403   GLUCOSEU NEGATIVE 07/25/2011 1403   HGBUR NEGATIVE 07/25/2011 1403   BILIRUBINUR NEGATIVE 07/25/2011 1403   KETONESUR NEGATIVE 07/25/2011 1403   PROTEINUR 30 (A) 07/25/2011 1403   UROBILINOGEN 1.0 07/25/2011 1403   NITRITE NEGATIVE 07/25/2011 1403   LEUKOCYTESUR NEGATIVE 07/25/2011 1403    Radiological Exams on Admission: CT ABDOMEN PELVIS WO CONTRAST  Result Date: 05/11/2019 CLINICAL DATA:  Elevated lipase.  Abdominal pain and weakness. EXAM: CT ABDOMEN AND PELVIS WITHOUT CONTRAST TECHNIQUE: Multidetector CT imaging of the abdomen and pelvis was performed following the standard protocol without IV contrast. COMPARISON:  Abdominal ultrasound 03/12/2019 FINDINGS: Lower chest: A in bilateral effusions left larger than right. Atelectasis at the left lower lobe. Hepatobiliary: Advanced chronic cirrhosis.  No calcified gallstones. Pancreas: Mild edema surrounding the pancreas could go along with mild pancreatitis. No advanced inflammatory change. Spleen: Moderate splenomegaly without focal lesion. Free intraperitoneal fluid surrounds the spleen. Adrenals/Urinary Tract: Adrenal glands are normal. Kidneys are normal. Bladder is normal. Stomach/Bowel: No sign of bowel obstruction. There may be some bowel wall thickening, presumably secondary to the presence of diffuse ascites. Vascular/Lymphatic: Aortic atherosclerosis. No aneurysm. IVC is normal. No retroperitoneal adenopathy. Reproductive: Normal  Other: Ascites, freely distributed. Musculoskeletal: Ordinary lumbar degenerative changes. IMPRESSION: Advanced cirrhosis of the liver. Moderate amount of ascites distributed throughout the peritoneal space. Some edema of the peripancreatic fat could go along with low level pancreatitis. No advanced pancreatitis or pseudocyst.  Aortic atherosclerosis. Bilateral pleural effusions left larger than right. Left lower lobe atelectasis. Electronically Signed   By: Nelson Chimes M.D.   On: 05/08/2019 18:30   DG Chest Port 1 View  Result Date: 05/08/2019 CLINICAL DATA:  Atrial fibrillation. EXAM: PORTABLE CHEST 1 VIEW COMPARISON:  June 18, 2018. FINDINGS: Stable cardiomediastinal silhouette. Right lung is clear. No pneumothorax is noted. Mild left basilar atelectasis or infiltrate is noted with associated pleural effusion. Bony thorax is unremarkable. IMPRESSION: Mild left basilar atelectasis or infiltrate is noted with associated pleural effusion. Electronically Signed   By: Marijo Conception M.D.   On: 05/06/2019 14:18    EKG: Independently reviewed.  Sinus or ectopic atrial tachycardia, heart rate 124.  LBBB similar to prior tracing.  Assessment/Plan Principal Problem:   Severe sepsis (HCC) Active Problems:   Atrial fibrillation (HCC)   Hepatic cirrhosis (HCC)   AKI (acute kidney injury) ([place])   Hyperkalemia   Severe sepsis, unknown exact etiology Tachycardic and tachypneic.  Hypotensive on arrival.  Labs showing significant leukocytosis.  Lactic acid elevated at 4.3.  Chest x-ray with questionable left basilar infiltrate although difficult to assess given associated pleural effusion.  SARS-CoV-2 PCR test negative. ?SBP -  Abdominal CT with moderate ascites, however, bedside ultrasound done in the ED revealed small volume ascites, no pocket large enough for diagnostic paracentesis.  Abdomen nontender to palpation. CT also showing findings consistent with possible low-level pancreatitis, lipase mildly  elevated at 101. -Blood pressure improved after IV fluid boluses.  Continue IV fluid hydration. -Continue broad-spectrum antibiotic coverage with vancomycin, cefepime, and metronidazole -Lactate improved to 3.8 after IV fluids.  Suspect underlying advanced liver cirrhosis is also contributing to lactate elevation.  Will continue to trend. -UA and urine culture pending -Blood culture x2 pending -Zofran as needed for nausea/vomiting  Acutely decompensated nonalcoholic liver cirrhosis, likely hepatic hydrothorax Chest x-ray with left-sided pleural effusion. Bedside ultrasound done in the ED revealed small volume ascites, no pocket large enough for diagnostic paracentesis.  T bili 3.4, AST 52.  T bili elevated on previous labs but now worse.  AST mildly elevated on previous labs as well.  ALT and alk phos normal.  INR 2.8.  No signs of encephalopathy, patient is AAO x4. -IR guided thoracentesis -Hold diuretics at this time given severe sepsis/hypotension -Hold lactulose at this time  Diarrhea CT not suggestive of colitis.  Does have significant leukocytosis. -Check C. difficile PCR and GI pathogen panel  AKI BUN 58, creatinine 2.6.  Baseline creatinine 1.1-1.2.  Suspect prerenal due to hypotension/severe sepsis. -IV fluid hydration -Continue to monitor renal function and urine output -Avoid nephrotoxic agents/contrast  Mild hyperkalemia Potassium 5.5.  Likely related to home spironolactone use. -Hold spironolactone at this time.  Closely monitor potassium level.  A. Fib Currently in sinus or ectopic atrial rhythm. -Hold home beta-blocker at this time given hypotension on arrival -Patient took his home Eliquis morning dose today.  Hold evening dose at this time in anticipation of possible thoracentesis in the morning.   Pharmacy med rec pending.  DVT prophylaxis: SCDs at this time Code Status: Patient wishes to be full code. Family Communication: No family at bedside. Disposition  Plan: Anticipate discharge after clinical improvement. Consults called: None Admission status: It is my clinical opinion that admission to [facility] is reasonable and necessary in this 79 y.o. male . presenting with severe sepsis from unknown exact source and acutely decompensated liver cirrhosis with ascites and pleural effusion.  Very high risk  of decompensation.  Given the aforementioned, the predictability of an adverse outcome is felt to be significant. I expect that the patient will require at least 2 midnights in the hospital to treat this condition.    The medical decision making on this patient was of high complexity and the patient is at high risk for clinical deterioration, therefore this is a level 3 visit.  Shela Leff MD [facility] Pager [phone]  If 7PM-7AM, please contact night-coverage [email or web address] Password [facility]  05/20/2019, 10:03 PM

## 2019-05-14 NOTE — ED Provider Notes (Signed)
[facility] EMERGENCY DEPARTMENT Provider Note   CSN: [id number] Arrival date & time: 05/23/2019  1041     History Chief Complaint  Patient presents with  . Abnormal Lab    Richard Saunders is a 79 y.o. male with history of persistent atrial fibrillation with Eloquis and toprolol, LBBB, non alcoholic cirrhosis, ascites s/p IR paracentesis presents to ER for evaluation of abnormal lab. Patient came to ER yesterday and labs obtained at triage, lipase was 2,501.  Patient had first COVID vaccine on 1/13.  That evening he developed generalized weakness, rhinorrhea, right sided abdominal cramping, nausea, vomiting, loose stools, chills. Yesterday he developed palpitations, light headedness.  He has not eaten food in the last 2 days, tolerating some fluids.  His mouth feels very dry.  He has only urinated twice in the last 2 days. Over the last 1 week has noticed heart rate in the 115-120s at home. Seen by Dr [name] cardiology for this who increased his toprolo and arranged for holter monitor to start today, unable to do it due to feeling ill.  Last dose of toprolol was this morning.   His abdominal cramping is essentially resolved.  Overall patient feels better than yesterday.  Unknown fever but some chills. No chest pain, shortness of breath, syncope. No sore throat or cough.  No dysuria. Compliant with all medicines including Eloquis. Last ascites and paracentesis 1-2 years ago. Dr Cristina Gong is GI physician.    HPI     Past Medical History:  Diagnosis Date  . Allergy   . Anxiety   . Aortic insufficiency    a. 04/2018 Echo: EF 55-60%. Mild to Mod MR. Mild AI/TR.  Marland Kitchen Atrial fibrillation ([place])    a. Dx 04/2018. CHA2DS2VASc = 4-->Eliquis.  . Depression   . H/O: rheumatic fever 2012   (per patient) - eval by [facility] LBBB with PVC's  . History of chicken pox   . Hypertension   . LBBB (left bundle branch block)   . Organic impotence   . PVC's (premature ventricular  contractions)     Patient Active Problem List   Diagnosis Date Noted  . Medicare annual wellness visit, subsequent 10/31/2018  . Other social stressor 10/31/2018  . Skin tear of forearm without complication, right, subsequent encounter 10/23/2018  . Shortness of breath   . Acute on chronic diastolic CHF (congestive heart failure) ([place]) 06/18/2018  . Ascites 06/18/2018  . Hepatic cirrhosis ([place]) 06/17/2018  . Abdominal bloating 06/14/2018  . Tachycardia 05/28/2018  . Atrial fibrillation ([place]) 05/28/2018  . Dupuytren contracture 05/03/2018  . Neck pain 01/27/2018  . Bell's palsy 11/29/2017  . Healthcare maintenance 10/28/2017  . Advance care planning 10/28/2017  . Rash 11/30/2014  . Left ventricular hypertrophy 06/10/2013  . Gout 08/04/2012  . Cough 09/26/2011  . Left bundle branch block 06/23/2010  . ANXIETY DEPRESSION 06/07/2010  . RHEUMATIC FEVER 06/07/2010  . Premature ventricular contractions 06/07/2010  . ALLERGIC RHINITIS 06/07/2010  . 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 Aortic regurgitation 06/07/2010  . CHICKENPOX, HX OF 06/07/2010    Past Surgical History:  Procedure Laterality Date  . Cautery for nosebleeds    . IR PARACENTESIS  06/19/2018  . Right elbow bursa removed    . TONSILLECTOMY AND ADENOIDECTOMY  1947       Family History  Problem Relation Age of Onset  . Cancer Mother        ovarian, uterine  . Aneurysm Mother  AAA in 1985  . Cancer Father        Prostate  . COPD Father   . Prostate cancer Father   . Diabetes Other   . Heart disease Other        CAD <60 male  . Hypertension Other   . Heart disease Maternal Grandfather        died from possible "heart attack" in early 73's  . Heart failure Maternal Uncle   . Colon cancer Neg Hx     Social History   Tobacco Use  . Smoking status: Former Smoker    Years: 40.00    Quit date: 04/29/1978    Years since quitting: 41.0  . Smokeless tobacco: Never Used  Substance Use Topics  .  Alcohol use: Not Currently    Alcohol/week: 0.0 standard drinks  . Drug use: No    Home Medications Prior to Admission medications   Medication Sig Start Date End Date Taking? Authorizing Provider  ALPRAZolam Duanne Moron) 0.25 MG tablet Take 0.5 tablets (0.125 mg total) by mouth daily as needed for anxiety. 01/27/18   Tonia Ghent, MD  apixaban (ELIQUIS) 5 MG TABS tablet Take 1 tablet (5 mg total) by mouth 2 (two) times daily. 06/02/18 10-Jun-2019  Fenton, Clint R, PA  cromolyn (OPTICROM) 4 % ophthalmic solution Place 2 drops into both eyes every morning.  05/31/15   [provider]  furosemide (LASIX) 20 MG tablet Take 20 mg by mouth daily.    [provider]  lactulose (CHRONULAC) 10 GM/15ML solution Takes 10 ml 2 times daily. 07/29/18   [provider]  metoprolol succinate (TOPROL-XL) 25 MG 24 hr tablet Take 0.5 tablets (12.5 mg total) by mouth 2 (two) times daily. 05/06/19   Belva Crome, MD  spironolactone (ALDACTONE) 50 MG tablet Take 1 tab a day if weight is >185 lbs. If weight 190-195, then take 1 tab twice a day.   If weight >195, then take 1 tab 3 times a day. 10/29/18   Tonia Ghent, MD  triamcinolone cream (KENALOG) 0.1 % Apply 1 application topically 2 (two) times daily as needed. 10/09/18   Tonia Ghent, MD    Allergies    Colchicine, Fluticasone propionate, and Nasacort [triamcinolone]  Review of Systems   Review of Systems  Constitutional: Positive for chills.  HENT: Positive for congestion.   Cardiovascular: Positive for palpitations.  Gastrointestinal: Positive for abdominal pain (improving), diarrhea, nausea and vomiting.  Neurological: Positive for light-headedness.  All other systems reviewed and are negative.   Physical Exam Updated Vital Signs BP 93/71   Pulse (!) 119   Temp 97.9 F (36.6 C) (Oral)   Resp 20   Ht 6' 1"  (1.854 m)   Wt 85.8 kg   SpO2 95%   BMI 24.95 kg/m   Physical Exam Vitals and nursing note reviewed.    Constitutional:      Appearance: He is well-developed.     Comments: Non toxic but appears tired   HENT:     Head: Normocephalic and atraumatic.     Nose: Nose normal.     Mouth/Throat:     Mouth: Mucous membranes are dry.     Comments: Dry lips but MMM Eyes:     Conjunctiva/sclera: Conjunctivae normal.  Cardiovascular:     Rate and Rhythm: Normal rate and regular rhythm.     Heart sounds: Normal heart sounds.     Comments: No LE edema  Pulmonary:  Effort: Pulmonary effort is normal.     Comments: Slightly diminished air sounds in lower lobes bilaterally. No respiratory distress. Speaking in full sentences.  No wheezing or crackles.  Abdominal:     General: Bowel sounds are normal. There is distension.     Palpations: Abdomen is soft.     Tenderness: There is abdominal tenderness.     Comments: Very mild distention vs obese abdomen, soft. Mild right upper, epigastric tenderness but no G/R/R. Active BS to lower quadrants.  No suprapubic or CVA tenderness.   Musculoskeletal:        General: Normal range of motion.     Cervical back: Normal range of motion.  Skin:    General: Skin is warm and dry.     Capillary Refill: Capillary refill takes less than 2 seconds.  Neurological:     Mental Status: He is alert.  Psychiatric:        Behavior: Behavior normal.     ED Results / Procedures / Treatments   Labs (all labs ordered are listed, but only abnormal results are displayed) Labs Reviewed  CBC WITH DIFFERENTIAL/PLATELET - Abnormal; Notable for the following components:      Result Value   WBC 24.8 (*)    Hemoglobin 17.2 (*)    MCV 105.6 (*)    MCH 35.7 (*)    Neutro Abs 21.9 (*)    Monocytes Absolute 1.7 (*)    Abs Immature Granulocytes 0.29 (*)    All other components within normal limits  COMPREHENSIVE METABOLIC PANEL - Abnormal; Notable for the following components:   Potassium 5.5 (*)    Glucose, Bld 104 (*)    BUN 58 (*)    Creatinine, Ser 2.69 (*)     Calcium 7.3 (*)    Total Protein 6.3 (*)    Albumin 3.0 (*)    AST 52 (*)    Total Bilirubin 3.4 (*)    GFR calc non Af Amer 22 (*)    GFR calc Af Amer 25 (*)    All other components within normal limits  LIPASE, BLOOD - Abnormal; Notable for the following components:   Lipase 101 (*)    All other components within normal limits  LACTIC ACID, PLASMA - Abnormal; Notable for the following components:   Lactic Acid, Venous 4.3 (*)    All other components within normal limits  APTT - Abnormal; Notable for the following components:   aPTT 40 (*)    All other components within normal limits  PROTIME-INR - Abnormal; Notable for the following components:   Prothrombin Time 29.1 (*)    INR 2.8 (*)    All other components within normal limits  RESPIRATORY PANEL BY RT PCR (FLU A&B, COVID)  CULTURE, BLOOD (ROUTINE X 2)  CULTURE, BLOOD (ROUTINE X 2)  URINE CULTURE  URINALYSIS, ROUTINE W REFLEX MICROSCOPIC  LACTIC ACID, PLASMA  TROPONIN I (HIGH SENSITIVITY)    EKG EKG Interpretation  Date/Time:  Friday May 14 2019 12:41:50 EST Ventricular Rate:  124 PR Interval:    QRS Duration: 129 QT Interval:  338 QTC Calculation: 486 R Axis:   -19 Text Interpretation: Sinus or ectopic atrial tachycardia IVCD, consider atypical LBBB Confirmed by Dene Gentry [phone]) on 05/19/2019 12:48:27 PM   Radiology DG Chest Port 1 View  Result Date: 05/11/2019 CLINICAL DATA:  Atrial fibrillation. EXAM: PORTABLE CHEST 1 VIEW COMPARISON:  June 18, 2018. FINDINGS: Stable cardiomediastinal silhouette. Right lung is clear. No pneumothorax is noted.  Mild left basilar atelectasis or infiltrate is noted with associated pleural effusion. Bony thorax is unremarkable. IMPRESSION: Mild left basilar atelectasis or infiltrate is noted with associated pleural effusion. Electronically Signed   By: Marijo Conception M.D.   On: 05/13/2019 14:18    Procedures .Critical Care Performed by: Kinnie Feil,  PA-C Authorized by: Kinnie Feil, PA-C   Critical care provider statement:    Critical care time (minutes):  45   Critical care was necessary to treat or prevent imminent or life-threatening deterioration of the following conditions:  Renal failure and sepsis   Critical care was time spent personally by me on the following activities:  Discussions with consultants, evaluation of patient's response to treatment, examination of patient, ordering and performing treatments and interventions, ordering and review of laboratory studies, ordering and review of radiographic studies, pulse oximetry, re-evaluation of patient's condition, obtaining history from patient or surrogate, review of old charts and development of treatment plan with patient or surrogate   I assumed direction of critical care for this patient from another provider in my specialty: no     (including critical care time)  Medications Ordered in ED Medications  vancomycin (VANCOREADY) IVPB 1750 mg/350 mL (has no administration in time range)  vancomycin variable dose per unstable renal function (pharmacist dosing) (has no administration in time range)  ceFEPIme (MAXIPIME) 2 g in sodium chloride 0.9 % 100 mL IVPB (has no administration in time range)  sodium chloride 0.9 % bolus 2,574 mL (has no administration in time range)  sodium chloride 0.9 % bolus 1,000 mL (0 mLs Intravenous Stopped 05/02/2019 1509)  ceFEPIme (MAXIPIME) 2 g in sodium chloride 0.9 % 100 mL IVPB (0 g Intravenous Stopped 05/26/2019 1448)  metroNIDAZOLE (FLAGYL) IVPB 500 mg (0 mg Intravenous Stopped 05/04/2019 1445)    ED Course  I have reviewed the triage vital signs and the nursing notes.  Pertinent labs & imaging results that were available during my care of the patient were reviewed by me and considered in my medical decision making (see chart for details).  Clinical Course as of May 13 1557  Fri May 14, 2019  1317 Pulse Rate(!): 123 [CG]  1317 BP(!): 85/67  [CG]  1425 IMPRESSION: Mild left basilar atelectasis or infiltrate is noted with associated pleural effusion.  DG Chest Port 1 View [CG]  1425 WBC(!): 24.8 [CG]  1425 NEUT#(!): 21.9 [CG]  1425 Monocyte #(!): 1.7 [CG]  1425 Abs Immature Granulocytes(!): 0.29 [CG]  1542 Lactic Acid, Venous(!!): 4.3 [CG]    Clinical Course User Index [CG] Kinnie Feil, PA-C   MDM Rules/Calculators/A&P                      Patient presents with GI symptoms after first COVID vaccine followed by generalized weakness, palpitations, light headedness.   Patient arrives tachycardic, hypotensive.  Reports usual SBP at home 90-100s.  HR has been elevated around 115-120s for the last week and seen by cardiologist for this a week ago. Has increased toprolol since.    Very mild RUQ/epigastric tenderness. Questionable fluid wave.  No over signs of peritonitis, severe distention. No respiratory distress.   EMR reviewed to assist with MDM.    Dr [name] cardiologist. Last echo with LVEF 55-60%.  Last seen in office for elevated HR one week ago.   Dr Buccini GI.  Last upper endoscopy and colonoscopy 11/2018. Last IR paracentesis 05/2018 with 2.5 L peritoneal fluid.   Etiology of  infectious syndrome unclear.  Ddx is broad given he has GI symptoms concern for intrabd process.  Abdominal exam is not overtly concerning but h/o cirrhosis and ascities so SBP is on differential.  He is not on respiratory distress from ascites and given mild tenderness on exam and mild distention will hold off on emergent paracentesis here. He is on Eloquis. Other ddx includes cholangitis, cholecystitis, pancreatitis complication, colon etiology.   No UTI symptoms.  No respiratory symptoms.   Will obtain COVID /influenza test. Will obtain rectal temp  1530: ER work up personally reviewed, as above.    Leukocytosis with lactic acid 4.3.  SIRS/sepsis criteria met.  Broad spectrum antibiotics and 30 cc/kg IVF ordered. Blood cultures  obtained.   AKI and hemoconcentration likely from dehydration.    Hyperkalemia noted.   LFT slightly elevated with total bili 3.4.  Lipase trending down.    EKG most consistent with sinus tachycardia, not atrial flutter/fibrillation with RVR but patient at risk to develop this.    1555: Patient will require admission.  Hand off to oncoming EDPA who will f/u on UA, CTAP, admit for SIRS/sepsis of uknown etiology, AKI. Etiology of elevated lipase, total bilirubin and SIRS/sepsis unclear at this time.  Intraabdominal process high on ddx. COVID/influenza negative.    Final Clinical Impression(s) / ED Diagnoses Final diagnoses:  SIRS (systemic inflammatory response syndrome) [facility])    Rx / DC Orders ED Discharge Orders    None       Kinnie Feil, PA-C 05/13/2019 1559    Valarie Merino, MD 05/17/19 1504

## 2019-05-14 NOTE — ED Provider Notes (Signed)
Patient care assumed at 1500 pending CT abdomen pelvis. He has been treated with antibiotics, IV fluids for possible sepsis. CT scan demonstrates moderate ascites, evidence of possible pancreatitis. Bedside ultrasound demonstrates small volume ascites, no pocket large enough for diagnostic paracentesis. Patient is tachycardia, hypotension improved with IV fluid resuscitation. Initial lactate was elevated at 4.3, improved to 3.8. Feel that elevation and lactate is multifactorial and his underlying liver disease is contributing to persistently elevated lactate. Patient updated of findings of studies recommendation for admission and he is in agreement with treatment plan. Hospitalist consulted for admission.   Quintella Reichert, MD 05/04/2019 [phone]

## 2019-05-14 NOTE — ED Notes (Signed)
Second to give report. RN unavailable will make 3rd attempt

## 2019-05-14 NOTE — ED Triage Notes (Signed)
Pt was here 1/13 for abd pain, weakness, n/v, got labs drawn and left without results. Pt was on mychart and got a call saying to come back due to pancreatic enzymes being elevated. Pt says he has not been able to urinate maybe twice in the last couple days. Pt says he no longer feels the abd pain, just very weak feeling.

## 2019-05-15 DIAGNOSIS — R197 Diarrhea, unspecified: Secondary | ICD-10-CM

## 2019-05-15 DIAGNOSIS — E861 Hypovolemia: Secondary | ICD-10-CM

## 2019-05-15 DIAGNOSIS — N179 Acute kidney failure, unspecified: Secondary | ICD-10-CM

## 2019-05-15 DIAGNOSIS — I9589 Other hypotension: Secondary | ICD-10-CM

## 2019-05-15 DIAGNOSIS — R6520 Severe sepsis without septic shock: Secondary | ICD-10-CM

## 2019-05-15 DIAGNOSIS — A419 Sepsis, unspecified organism: Principal | ICD-10-CM

## 2019-05-15 LAB — HEPATIC FUNCTION PANEL
ALT: 19 U/L (ref 0–44)
AST: 36 U/L (ref 15–41)
Albumin: 2.5 g/dL — ABNORMAL LOW (ref 3.5–5.0)
Alkaline Phosphatase: 38 U/L (ref 38–126)
Bilirubin, Direct: 0.8 mg/dL — ABNORMAL HIGH (ref 0.0–0.2)
Indirect Bilirubin: 2 mg/dL — ABNORMAL HIGH (ref 0.3–0.9)
Total Bilirubin: 2.8 mg/dL — ABNORMAL HIGH (ref 0.3–1.2)
Total Protein: 4.9 g/dL — ABNORMAL LOW (ref 6.5–8.1)

## 2019-05-15 LAB — URINALYSIS, ROUTINE W REFLEX MICROSCOPIC
Bilirubin Urine: NEGATIVE
Glucose, UA: NEGATIVE mg/dL
Hgb urine dipstick: NEGATIVE
Ketones, ur: NEGATIVE mg/dL
Leukocytes,Ua: NEGATIVE
Nitrite: NEGATIVE
Protein, ur: NEGATIVE mg/dL
Specific Gravity, Urine: 1.025 (ref 1.005–1.030)
pH: 5 (ref 5.0–8.0)

## 2019-05-15 LAB — BASIC METABOLIC PANEL
Anion gap: 11 (ref 5–15)
BUN: 60 mg/dL — ABNORMAL HIGH (ref 8–23)
CO2: 18 mmol/L — ABNORMAL LOW (ref 22–32)
Calcium: 6.4 mg/dL — CL (ref 8.9–10.3)
Chloride: 109 mmol/L (ref 98–111)
Creatinine, Ser: 2.19 mg/dL — ABNORMAL HIGH (ref 0.61–1.24)
GFR calc Af Amer: 32 mL/min — ABNORMAL LOW (ref >60)
GFR calc non Af Amer: 28 mL/min — ABNORMAL LOW (ref >60)
Glucose, Bld: 110 mg/dL — ABNORMAL HIGH (ref 70–99)
Potassium: 4.7 mmol/L (ref 3.5–5.1)
Sodium: 138 mmol/L (ref 135–145)

## 2019-05-15 LAB — TROPONIN I (HIGH SENSITIVITY)
Troponin I (High Sensitivity): 60 ng/L — ABNORMAL HIGH (ref <18)
Troponin I (High Sensitivity): 50 ng/L — ABNORMAL HIGH (ref <18)
Troponin I (High Sensitivity): 50 ng/L — ABNORMAL HIGH (ref <18)
Troponin I (High Sensitivity): 41 ng/L — ABNORMAL HIGH (ref <18)

## 2019-05-15 LAB — LACTATE DEHYDROGENASE: LDH: 318 U/L — ABNORMAL HIGH (ref 98–192)

## 2019-05-15 LAB — C DIFFICILE QUICK SCREEN W PCR REFLEX
C Diff antigen: NEGATIVE
C Diff interpretation: NOT DETECTED
C Diff toxin: NEGATIVE

## 2019-05-15 LAB — LACTIC ACID, PLASMA
Lactic Acid, Venous: 4 mmol/L (ref 0.5–1.9)
Lactic Acid, Venous: 3.2 mmol/L (ref 0.5–1.9)
Lactic Acid, Venous: 2.5 mmol/L (ref 0.5–1.9)

## 2019-05-15 MED ORDER — SODIUM CHLORIDE 0.9 % IV BOLUS
1000.0000 mL | Freq: Once | INTRAVENOUS | Status: AC
  Administered 2019-05-15: 05:00:00 1000 mL via INTRAVENOUS

## 2019-05-15 MED ORDER — SODIUM CHLORIDE 0.9 % IV BOLUS
500.0000 mL | Freq: Once | INTRAVENOUS | Status: AC
  Administered 2019-05-15: 08:00:00 500 mL via INTRAVENOUS

## 2019-05-15 MED ORDER — CALCIUM GLUCONATE-NACL 2-0.675 GM/100ML-% IV SOLN
2.0000 g | Freq: Once | INTRAVENOUS | Status: AC
  Administered 2019-05-15: 2000 mg via INTRAVENOUS
  Filled 2019-05-15 (×2): qty 100

## 2019-05-15 MED ORDER — LACTATED RINGERS IV SOLN: INTRAVENOUS | Status: DC

## 2019-05-15 MED ORDER — ALBUMIN HUMAN 5 % IV SOLN
12.5000 g | Freq: Once | INTRAVENOUS | Status: AC
  Administered 2019-05-15: 12.5 g via INTRAVENOUS
  Filled 2019-05-15 (×3): qty 250

## 2019-05-15 MED ORDER — SODIUM CHLORIDE 0.9 % IV BOLUS
250.0000 mL | Freq: Once | INTRAVENOUS | Status: AC
  Administered 2019-05-15: 250 mL via INTRAVENOUS

## 2019-05-15 MED ORDER — VANCOMYCIN HCL IN DEXTROSE 1-5 GM/200ML-% IV SOLN: 1000.0000 mg | Freq: Once | INTRAVENOUS | Status: DC

## 2019-05-15 NOTE — Consult Note (Signed)
NAME:  Richard Saunders, MRN:  [id number], DOB:  1940/10/04, LOS: 1 ADMISSION DATE:  05/12/2019, CONSULTATION DATE:  05/15/19 REFERRING MD: Shela Leff, MD  CHIEF COMPLAINT:  Hypotension  Brief History   79 year old male with cirrhosis who presented with N/V/D and admitted for sepsis secondary to GI etiology. PCCM consulted for hypotension.  History of present illness   [name] is a 79 year old male with the below medical history who presented to the ED for abdominal pain, nausea, vomiting and weakness on 1/13-1/14. He left AMA however his lab results after he left demonstrated elevated lipase 2501. He returned to the ED with similar symptoms as well as new onset diarrhea. Prior to admission he has had poor PO intake, decreased UOP and tachycardia in the last week. His toprolol was increased by his Cardiologist for tachycardia. Of note, he did receive COVID vaccination on 1/13 but states symptoms started prior to this.   In the ED he was hypotensive with BP 85/67. Labs significant for WBC 24.8, K 5.5, BUN/Cr 58/2.6 and LA 4.3. CT A/P with cirrhosis and ascites, pancreatitis and bilateral pleural effusion. ED [place] with no pocket seen for ascites drainage. Given IVF and broad spectrum antibiotics with improvement in hypotension.  This morning, patient with persistent hypotension. Has received total 5.5L overnight and on mIVF 125/hr.   Past Medical History  Atrial fibrillation on Eliquis, hx of PVS, aortic insufficiency, HTN, non-alcoholic cirrhosis, hx of ascites, anxiety/depression  Significant Hospital Events   1/13 Presented to ED with N/V/abdominal pain. Left AMA 1/15 Returned to ED  Consults:  [facility] PCCM  Procedures:    Significant Diagnostic Tests:  CT A/P Advanced cirrhosis of the liver, ascites, pancreatitis, bilateral pleural effusions  Micro Data:  BCX 1/15 NGTD C.diff 1/16 negative  Antimicrobials:  Vanc 1/15> Cefepime 1/15> Flagyl 1/15 >  Interim  history/subjective:  As above  Objective   Blood pressure (!) 81/56, pulse (!) 121, temperature 97.8 F (36.6 C), temperature source Oral, resp. rate 20, height 6' 1"  (1.854 m), weight 87.4 kg, SpO2 92 %.        Intake/Output Summary (Last 24 hours) at 05/15/2019 0814 Last data filed at 05/15/2019 0445 Gross per 24 hour  Intake 3504.74 ml  Output 150 ml  Net 3354.74 ml   Filed Weights   05/15/2019 1336 05/12/2019 2143  Weight: 85.8 kg 87.4 kg    Physical Exam: General: Well-appearing, no acute distress HENT: [place], AT, OP clear, MMM Eyes: EOMI, no scleral icterus Respiratory: Clear to auscultation bilaterally.  No crackles, wheezing or rales Cardiovascular: RRR, -M/R/G, no JVD GI: BS+, soft, mild TTP diffusely, distended, no fluid wave Extremities: Trace pedal edema Neuro: AAO x4, CNII-XII grossly intact Skin: Intact, no rashes or bruising Psych: Normal mood, normal affect  Resolved Hospital Problem list     Assessment & Plan:   Hypovolemic hypotension - responsive to IVF resuscitation. Likely related to volume loss from N/V/D Severe sepsis secondary to GI source C.diff negative --500cc IVF bolus now --Recommend to continue mIVF x 24 hours and reassess BP and volume status --PRN zofran --Trend lactic acid --Continue broad spectrum antibiotics --F/u cultures and GI panel --Will need to ensure IV access. [place] with PICC if unable to obtain IV access  AKI - improving Decreased UOP prior to admission. Slightly improving per patient --Trend BMP daily  Best practice:  Diet: Per primary Pain/Anxiety/Delirium protocol (if indicated): -- VAP protocol (if indicated): -- DVT prophylaxis: SCDs GI  prophylaxis: -- Glucose control: Per primary Mobility: As tolerated Code Status: Full Family Communication: Discussed plan with patient at bedside Disposition:   Labs   CBC: Recent Labs  Lab 05/13/19 0002 05/29/2019 1334  WBC 14.6* 24.8*  NEUTROABS  --  21.9*  HGB 15.9 17.2*    HCT 46.7 50.9  MCV 104.9* 105.6*  PLT 218 852    Basic Metabolic Panel: Recent Labs  Lab 05/13/19 0002 05/23/2019 1334 05/15/19 0030  NA 141 137 138  K 4.1 5.5* 4.7  CL 104 101 109  CO2 26 23 18*  GLUCOSE 168* 104* 110*  BUN 25* 58* 60*  CREATININE 1.42* 2.69* 2.19*  CALCIUM 9.5 7.3* 6.4*   GFR: Estimated Creatinine Clearance: 31.4 mL/min (A) (by C-G formula based on SCr of 2.19 mg/dL (H)). Recent Labs  Lab 05/13/19 0002 05/10/2019 1334 05/16/2019 1349 05/25/2019 1735 05/15/19 0030  WBC 14.6* 24.8*  --   --   --   LATICACIDVEN  --   --  4.3* 3.8* 4.0*    Liver Function Tests: Recent Labs  Lab 05/13/19 0002 05/29/2019 1334 05/15/19 0030  AST 57* 52* 36  ALT 28 24 19   ALKPHOS 60 48 38  BILITOT 1.9* 3.4* 2.8*  PROT 6.8 6.3* 4.9*  ALBUMIN 3.5 3.0* 2.5*   Recent Labs  Lab 05/13/19 0002 05/01/2019 1334  LIPASE 2,501* 101*   No results for input(s): AMMONIA in the last 168 hours.  ABG    Component Value Date/Time   TCO2 24 07/25/2011 1411     Coagulation Profile: Recent Labs  Lab 05/23/2019 1410  INR 2.8*    Cardiac Enzymes: No results for input(s): CKTOTAL, CKMB, CKMBINDEX, TROPONINI in the last 168 hours.  HbA1C: Hgb A1c MFr Bld  Date/Time Value Ref Range Status  07/29/2016 09:14 AM 5.2 4.6 - 6.5 % Final    Comment:    Glycemic Control Guidelines for People with Diabetes:Non Diabetic:  <6%Goal of Therapy: <7%Additional Action Suggested:  >8%   07/22/2012 09:41 AM 5.5 4.6 - 6.5 % Final    Comment:    Glycemic Control Guidelines for People with Diabetes:Non Diabetic:  <6%Goal of Therapy: <7%Additional Action Suggested:  >8%     CBG: No results for input(s): GLUCAP in the last 168 hours.  Review of Systems:   Review of Systems  Constitutional: Positive for malaise/fatigue. Negative for chills, diaphoresis, fever and weight loss.  HENT: Negative for congestion, ear pain and sore throat.   Respiratory: Negative for cough, hemoptysis, sputum  production, shortness of breath and wheezing.   Cardiovascular: Positive for palpitations. Negative for chest pain and leg swelling.  Gastrointestinal: Positive for abdominal pain, nausea and vomiting. Negative for heartburn.  Genitourinary: Positive for frequency (decreased).  Musculoskeletal: Negative for joint pain and myalgias.  Skin: Negative for itching and rash.  Neurological: Negative for dizziness, weakness and headaches.  Endo/Heme/Allergies: Does not bruise/bleed easily.  Psychiatric/Behavioral: Negative for depression. The patient is not nervous/anxious.      Past Medical History  He,  has a past medical history of Allergy, Anxiety, Aortic insufficiency, Atrial fibrillation ([place]), Depression, H/O: rheumatic fever (2012), History of chicken pox, Hypertension, LBBB (left bundle branch block), Organic impotence, and PVC's (premature ventricular contractions).   Surgical History    Past Surgical History:  Procedure Laterality Date  . Cautery for nosebleeds    . IR PARACENTESIS  06/19/2018  . Right elbow bursa removed    . TONSILLECTOMY AND ADENOIDECTOMY  1947  Social History   reports that he quit smoking about 41 years ago. He quit after 40.00 years of use. He has never used smokeless tobacco. He reports previous alcohol use. He reports that he does not use drugs.   Family History   His family history includes Aneurysm in his mother; COPD in his father; Cancer in his father and mother; Diabetes in an other family member; Heart disease in his maternal grandfather and another family member; Heart failure in his maternal uncle; Hypertension in an other family member; Prostate cancer in his father. There is no history of Colon cancer.   Allergies Allergies  Allergen Reactions  . Colchicine Diarrhea  . Fluticasone Propionate Other (See Comments)    He didn't tolerate the taste/smell prev  . Nasacort [Triamcinolone] Other (See Comments)    Didn't tolerate     Home  Medications  Prior to Admission medications   Medication Sig Start Date End Date Taking? Authorizing Provider  ALPRAZolam Duanne Moron) 0.25 MG tablet Take 0.5 tablets (0.125 mg total) by mouth daily as needed for anxiety. 01/27/18   Tonia Ghent, MD  apixaban (ELIQUIS) 5 MG TABS tablet Take 1 tablet (5 mg total) by mouth 2 (two) times daily. 06/02/18 06-03-19  Fenton, Clint R, PA  cromolyn (OPTICROM) 4 % ophthalmic solution Place 2 drops into both eyes every morning.  05/31/15   [provider]  furosemide (LASIX) 20 MG tablet Take 20 mg by mouth daily.    [provider]  lactulose (CHRONULAC) 10 GM/15ML solution Takes 10 ml 2 times daily. 07/29/18   [provider]  metoprolol succinate (TOPROL-XL) 25 MG 24 hr tablet Take 0.5 tablets (12.5 mg total) by mouth 2 (two) times daily. 05/06/19   Belva Crome, MD  spironolactone (ALDACTONE) 50 MG tablet Take 1 tab a day if weight is >185 lbs. If weight 190-195, then take 1 tab twice a day.   If weight >195, then take 1 tab 3 times a day. 10/29/18   Tonia Ghent, MD  triamcinolone cream (KENALOG) 0.1 % Apply 1 application topically 2 (two) times daily as needed. 10/09/18   Tonia Ghent, MD     Critical care time: 50 min    The patient is critically ill with multiple organ systems failure and requires high complexity decision making for assessment and support, frequent evaluation and titration of therapies, application of advanced monitoring technologies and extensive interpretation of multiple databases.   Rodman Pickle, M.D. [facility] Pulmonary/Critical Care Medicine 05/15/2019 8:14 AM   Please see Amion for pager number to reach on-call Pulmonary and Critical Care Team.

## 2019-05-15 NOTE — Significant Event (Addendum)
Rapid Response Event Note  Overview: "Second Set of Eyes"   Initial Focused Assessment: I came by to see Mr. Buena Irish at the request of his primary nurse. Patient has had some soft BPs per nurse. Upon arrival, patient was alert and oriented x 4, skin warm and dry, he endorsed having generalized aches but was not in acute distress. HR ST 120s (has been all day), SBP > 90s and MAP > 70s. Good capillary refill. His abdomenl is distended and he has only voided x 2 today. 95% on RA, good respiratory effort.   Interventions: -- No RRT Interventions   Plan of Care: -- Monitor and Trend VS - correlate VS with physical assessments  -- Bladder Scan -- patient has received well over 5.5L of crystalloid resuscitation  -- Strict I/Os   Event Summary:   Start Time 1600 End Time [place], [place]

## 2019-05-15 NOTE — Progress Notes (Signed)
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 217m bolus given. Systolic remains > 90 after bolus. Rapid Response RN Puja was on the floor so I asked if she could come look at him. Will continue to monitor.  JPaulene Floor RN

## 2019-05-15 NOTE — Progress Notes (Signed)
PROGRESS NOTE    Richard Saunders  JHE:[id number] DOB: June 08, 1940 DOA: 05/19/2019 PCP: Tonia Ghent, MD   Brief Narrative: 79 year old with past medical history significant for aortic insufficiency, A. fib on Eliquis, hypertension, LBBB, PVCs, nonalcoholic liver cirrhosis, history of ascites status post IR paracentesis last year, who presents complaining of generalized weakness.  Patient was evaluated in the ED on 1/13 for abdominal pain and emesis, labs were drawn but patient left before being seen by a provider.  Labs returned showing elevated lipase at 20 541 and he was advised to return to the ED.  Patient received his first Covid vaccine on 1/13.  Since he received the vaccine he has not been feeling well.  Evaluation in the ED patient was found to be tachycardic heart rate in the 120s, tachypneic with respiratory rate in the 20s, hypotensive with blood pressure 85/67.  White count 24, lactic acid 4.3, potassium 5.5, creatinine 2.6.  Transaminases.  Chest x-ray showed mild left bibasilar atelectasis or infiltrate with associated pleural effusion.  CT abdomen showed advanced liver cirrhosis with moderate amount of ascites.  Peripancreatic fat concerning with low-level pancreatitis. She received IV fluids, started on IV antibiotics.  Continues to be hypotensive, CCM has been consulted.   Assessment & Plan:   Principal Problem:   Severe sepsis ([place]) Active Problems:   Atrial fibrillation (HCC)   Hepatic cirrhosis (HCC)   AKI (acute kidney injury) ([place])   Hyperkalemia   1-Sepsis, hypotension; patient has received more than 2.5 L of IV fluids, remained hypotensive, lactic acid at 4.  Chest x-ray with infiltrates.  CT with possible colitis.  Repeat IV bolus.  CCM consulted. Continue with cefepime and flagyl.  Patient received albumin. C. difficile negative.  2-Acute pancreatitis; continue n.p.o., continue with IV fluids.  Supportive care.  Pain management. Lipase normal, mild  elevation of AST.  CT abdomen: No calcified gallstone.  3-Pleural effusion.  Defer  to CCM thoracentesis.  4-AKI on CKD stage IIIa Prior creatinine 1.2 Present with a creatinine up to 2.6. Likely related to hypovolemia and sepsis and hypotension. Continue with IV fluids.  A fib; holding beta-blocker due to hypotension. Holding Eliquis in anticipation of thoracentesis  Hyperkalemia; resolved  Diarrhea, colitis;  Continue with fluid and IV Flagyl. C. difficile negative.     Estimated body mass index is 25.42 kg/m as calculated from the following:   Height as of this encounter: 6' 1"  (1.854 m).   Weight as of this encounter: 87.4 kg.   DVT prophylaxis: resume eliquis.  Code Status: Full code Family Communication: care discussed with patient.  Disposition Plan: remain in the hospital;for management of sepsis.  Consultants:   CCM  Procedures:   none  Antimicrobials:    Subjective: Alert, report abdominal pain.  Denies dyspnea.   Objective: Vitals:   05/15/19 0410 05/15/19 0557 05/15/19 0653 05/15/19 0742  BP: (!) 78/35 (!) 89/59 (!) 79/59 (!) 81/56  Pulse: (!) 122   (!) 121  Resp: 19 16 20 20   Temp: 98 F (36.7 C)   97.8 F (36.6 C)  TempSrc: Oral   Oral  SpO2: 94%   92%  Weight:      Height:        Intake/Output Summary (Last 24 hours) at 05/15/2019 0759 Last data filed at 05/15/2019 0445 Gross per 24 hour  Intake 3504.74 ml  Output 150 ml  Net 3354.74 ml   Filed Weights   05/17/2019 1336 05/02/2019 2143  Weight: 85.8 kg  87.4 kg    Examination:  General exam: Appears calm and comfortable  Respiratory system: Clear to auscultation. Respiratory effort normal. Cardiovascular system: S1 & S2 heard, RRR. Gastrointestinal system: Abdomen is distended, soft and mild tender. No organomegaly or masses felt. Normal bowel sounds heard. Central nervous system: Alert and oriented.  Extremities: Symmetric 5 x 5 power. Skin: No rashes, lesions or  ulcers   Data Reviewed: I have personally reviewed following labs and imaging studies  CBC: Recent Labs  Lab 05/13/19 0002 05/18/2019 1334  WBC 14.6* 24.8*  NEUTROABS  --  21.9*  HGB 15.9 17.2*  HCT 46.7 50.9  MCV 104.9* 105.6*  PLT 218 283   Basic Metabolic Panel: Recent Labs  Lab 05/13/19 0002 05/27/2019 1334 05/15/19 0030  NA 141 137 138  K 4.1 5.5* 4.7  CL 104 101 109  CO2 26 23 18*  GLUCOSE 168* 104* 110*  BUN 25* 58* 60*  CREATININE 1.42* 2.69* 2.19*  CALCIUM 9.5 7.3* 6.4*   GFR: Estimated Creatinine Clearance: 31.4 mL/min (A) (by C-G formula based on SCr of 2.19 mg/dL (H)). Liver Function Tests: Recent Labs  Lab 05/13/19 0002 05/04/2019 1334 05/15/19 0030  AST 57* 52* 36  ALT 28 24 19   ALKPHOS 60 48 38  BILITOT 1.9* 3.4* 2.8*  PROT 6.8 6.3* 4.9*  ALBUMIN 3.5 3.0* 2.5*   Recent Labs  Lab 05/13/19 0002 05/18/2019 1334  LIPASE 2,501* 101*   No results for input(s): AMMONIA in the last 168 hours. Coagulation Profile: Recent Labs  Lab 05/21/2019 1410  INR 2.8*   Cardiac Enzymes: No results for input(s): CKTOTAL, CKMB, CKMBINDEX, TROPONINI in the last 168 hours. BNP (last 3 results) Recent Labs    06/05/18 1356 06/11/18 1644  PROBNP 116.0* 81.0   HbA1C: No results for input(s): HGBA1C in the last 72 hours. CBG: No results for input(s): GLUCAP in the last 168 hours. Lipid Profile: No results for input(s): CHOL, HDL, LDLCALC, TRIG, CHOLHDL, LDLDIRECT in the last 72 hours. Thyroid Function Tests: No results for input(s): TSH, T4TOTAL, FREET4, T3FREE, THYROIDAB in the last 72 hours. Anemia Panel: No results for input(s): VITAMINB12, FOLATE, FERRITIN, TIBC, IRON, RETICCTPCT in the last 72 hours. Sepsis Labs: Recent Labs  Lab 05/19/2019 1349 05/23/2019 1735 05/15/19 0030  LATICACIDVEN 4.3* 3.8* 4.0*    Recent Results (from the past 240 hour(s))  Blood Culture (routine x 2)     Status: None (Preliminary result)   Collection Time: 05/16/2019  2:10 PM    Specimen: BLOOD  Result Value Ref Range Status   Specimen Description BLOOD RIGHT ANTECUBITAL  Final   Special Requests   Final    BOTTLES DRAWN AEROBIC ONLY Blood Culture results may not be optimal due to an inadequate volume of blood received in culture bottles   Culture   Final    NO GROWTH < 24 HOURS Performed at [facility] [address], [place], [place] [address]    Report Status PENDING  Incomplete  Blood Culture (routine x 2)     Status: None (Preliminary result)   Collection Time: 05/08/2019  2:10 PM   Specimen: BLOOD  Result Value Ref Range Status   Specimen Description BLOOD LEFT ANTECUBITAL  Final   Special Requests   Final    BOTTLES DRAWN AEROBIC AND ANAEROBIC Blood Culture adequate volume   Culture   Final    NO GROWTH < 24 HOURS Performed at [facility], [place],  [place] [address]    Report Status PENDING  Incomplete  Respiratory Panel by RT PCR (Flu A&B, Covid) - Nasopharyngeal Swab     Status: None   Collection Time: 05/19/2019  2:20 PM   Specimen: Nasopharyngeal Swab  Result Value Ref Range Status   SARS Coronavirus 2 by RT PCR NEGATIVE NEGATIVE Final    Comment: (NOTE) SARS-CoV-2 target nucleic acids are NOT DETECTED. The SARS-CoV-2 RNA is generally detectable in upper respiratoy specimens during the acute phase of infection. The lowest concentration of SARS-CoV-2 viral copies this assay can detect is 131 copies/mL. A negative result does not preclude SARS-Cov-2 infection and should not be used as the sole basis for treatment or other patient management decisions. A negative result may occur with  improper specimen collection/handling, submission of specimen other than nasopharyngeal swab, presence of viral mutation(s) within the areas targeted by this assay, and inadequate number of viral copies (<131 copies/mL). A negative result must be combined with clinical observations, patient history, and epidemiological information.  The expected result is Negative. Fact Sheet for Patients:  PinkCheek.be Fact Sheet for Healthcare Providers:  GravelBags.it This test is not yet ap proved or cleared by the [place] FDA and  has been authorized for detection and/or diagnosis of SARS-CoV-2 by FDA under an Emergency Use Authorization (EUA). This EUA will remain  in effect (meaning this test can be used) for the duration of the COVID-19 declaration under Section 564(b)(1) of the Act, 21 U.S.C. section 360bbb-3(b)(1), unless the authorization is terminated or revoked sooner.    Influenza A by PCR NEGATIVE NEGATIVE Final   Influenza B by PCR NEGATIVE NEGATIVE Final    Comment: (NOTE) The Xpert Xpress SARS-CoV-2/FLU/RSV assay is intended as an aid in  the diagnosis of influenza from Nasopharyngeal swab specimens and  should not be used as a sole basis for treatment. Nasal washings and  aspirates are unacceptable for Xpert Xpress SARS-CoV-2/FLU/RSV  testing. Fact Sheet for Patients: PinkCheek.be Fact Sheet for Healthcare Providers: GravelBags.it This test is not yet approved or cleared by the [place] FDA and  has been authorized for detection and/or diagnosis of SARS-CoV-2 by  FDA under an Emergency Use Authorization (EUA). This EUA will remain  in effect (meaning this test can be used) for the duration of the  Covid-19 declaration under Section 564(b)(1) of the Act, 21  U.S.C. section 360bbb-3(b)(1), unless the authorization is  terminated or revoked. Performed at [facility], [place] [address], [place], [place] [address]   C difficile quick scan w PCR reflex     Status: None   Collection Time: 05/15/19  1:35 AM   Specimen: STOOL  Result Value Ref Range Status   C Diff antigen NEGATIVE NEGATIVE Final   C Diff toxin NEGATIVE NEGATIVE Final   C Diff interpretation No C. difficile detected.   Final    Comment: Performed at [facility], [place] [address], [place], [place] [address]         Radiology Studies: CT ABDOMEN PELVIS WO CONTRAST  Result Date: 05/11/2019 CLINICAL DATA:  Elevated lipase.  Abdominal pain and weakness. EXAM: CT ABDOMEN AND PELVIS WITHOUT CONTRAST TECHNIQUE: Multidetector CT imaging of the abdomen and pelvis was performed following the standard protocol without IV contrast. COMPARISON:  Abdominal ultrasound 03/12/2019 FINDINGS: Lower chest: A in bilateral effusions left larger than right. Atelectasis at the left lower lobe. Hepatobiliary: Advanced chronic cirrhosis.  No calcified gallstones. Pancreas: Mild edema surrounding the pancreas could go  along with mild pancreatitis. No advanced inflammatory change. Spleen: Moderate splenomegaly without focal lesion. Free intraperitoneal fluid surrounds the spleen. Adrenals/Urinary Tract: Adrenal glands are normal. Kidneys are normal. Bladder is normal. Stomach/Bowel: No sign of bowel obstruction. There may be some bowel wall thickening, presumably secondary to the presence of diffuse ascites. Vascular/Lymphatic: Aortic atherosclerosis. No aneurysm. IVC is normal. No retroperitoneal adenopathy. Reproductive: Normal Other: Ascites, freely distributed. Musculoskeletal: Ordinary lumbar degenerative changes. IMPRESSION: Advanced cirrhosis of the liver. Moderate amount of ascites distributed throughout the peritoneal space. Some edema of the peripancreatic fat could go along with low level pancreatitis. No advanced pancreatitis or pseudocyst. Aortic atherosclerosis. Bilateral pleural effusions left larger than right. Left lower lobe atelectasis. Electronically Signed   By: Nelson Chimes M.D.   On: 05/12/2019 18:30   DG Chest Port 1 View  Result Date: 05/26/2019 CLINICAL DATA:  Atrial fibrillation. EXAM: PORTABLE CHEST 1 VIEW COMPARISON:  June 18, 2018. FINDINGS: Stable cardiomediastinal silhouette. Right lung is clear. No  pneumothorax is noted. Mild left basilar atelectasis or infiltrate is noted with associated pleural effusion. Bony thorax is unremarkable. IMPRESSION: Mild left basilar atelectasis or infiltrate is noted with associated pleural effusion. Electronically Signed   By: Marijo Conception M.D.   On: 05/08/2019 14:18        Scheduled Meds: . vancomycin variable dose per unstable renal function (pharmacist dosing)   Does not apply See admin instructions   Continuous Infusions: . albumin human    . calcium gluconate    . ceFEPime (MAXIPIME) IV    . lactated ringers    . metronidazole 500 mg (05/15/19 0610)  . sodium chloride       LOS: 1 day    Time spent: 35 minutes.     Elmarie Shiley, MD [facility]   If 7PM-7AM, please contact night-coverage [email or web address] Password [facility] 05/15/2019, 7:59 AM

## 2019-05-15 NOTE — Plan of Care (Signed)
  Problem: Fluid Volume: Goal: Hemodynamic stability will improve Outcome: Progressing   Problem: Clinical Measurements: Goal: Diagnostic test results will improve Outcome: Progressing

## 2019-05-16 ENCOUNTER — Inpatient Hospital Stay (HOSPITAL_COMMUNITY): Payer: Medicare HMO

## 2019-05-16 ENCOUNTER — Encounter (HOSPITAL_COMMUNITY): Payer: Self-pay | Admitting: Internal Medicine

## 2019-05-16 ENCOUNTER — Other Ambulatory Visit (HOSPITAL_COMMUNITY): Payer: Medicare HMO

## 2019-05-16 LAB — COMPREHENSIVE METABOLIC PANEL
ALT: 27 U/L (ref 0–44)
AST: 48 U/L — ABNORMAL HIGH (ref 15–41)
Albumin: 2.7 g/dL — ABNORMAL LOW (ref 3.5–5.0)
Alkaline Phosphatase: 43 U/L (ref 38–126)
Anion gap: 11 (ref 5–15)
BUN: 69 mg/dL — ABNORMAL HIGH (ref 8–23)
CO2: 18 mmol/L — ABNORMAL LOW (ref 22–32)
Calcium: 7.1 mg/dL — ABNORMAL LOW (ref 8.9–10.3)
Chloride: 109 mmol/L (ref 98–111)
Creatinine, Ser: 1.76 mg/dL — ABNORMAL HIGH (ref 0.61–1.24)
GFR calc Af Amer: 42 mL/min — ABNORMAL LOW (ref >60)
GFR calc non Af Amer: 36 mL/min — ABNORMAL LOW (ref >60)
Glucose, Bld: 79 mg/dL (ref 70–99)
Potassium: 4.7 mmol/L (ref 3.5–5.1)
Sodium: 138 mmol/L (ref 135–145)
Total Bilirubin: 2.9 mg/dL — ABNORMAL HIGH (ref 0.3–1.2)
Total Protein: 5.4 g/dL — ABNORMAL LOW (ref 6.5–8.1)

## 2019-05-16 LAB — CBC
HCT: 39.5 % (ref 39.0–52.0)
Hemoglobin: 13.6 g/dL (ref 13.0–17.0)
MCH: 36.3 pg — ABNORMAL HIGH (ref 26.0–34.0)
MCHC: 34.4 g/dL (ref 30.0–36.0)
MCV: 105.3 fL — ABNORMAL HIGH (ref 80.0–100.0)
Platelets: 148 10*3/uL — ABNORMAL LOW (ref 150–400)
RBC: 3.75 MIL/uL — ABNORMAL LOW (ref 4.22–5.81)
RDW: 13.5 % (ref 11.5–15.5)
WBC: 14.3 10*3/uL — ABNORMAL HIGH (ref 4.0–10.5)
nRBC: 0 % (ref 0.0–0.2)

## 2019-05-16 LAB — BODY FLUID CELL COUNT WITH DIFFERENTIAL
Lymphs, Fluid: 6 %
Monocyte-Macrophage-Serous Fluid: 14 % — ABNORMAL LOW (ref 50–90)
Neutrophil Count, Fluid: 80 % — ABNORMAL HIGH (ref 0–25)
Total Nucleated Cell Count, Fluid: 1365 cu mm — ABNORMAL HIGH (ref 0–1000)

## 2019-05-16 LAB — ALBUMIN: Albumin: 2.8 g/dL — ABNORMAL LOW (ref 3.5–5.0)

## 2019-05-16 LAB — CALCIUM, IONIZED: Calcium, Ionized, Serum: 3.3 mg/dL — ABNORMAL LOW (ref 4.5–5.6)

## 2019-05-16 LAB — URINE CULTURE: Culture: NO GROWTH

## 2019-05-16 MED ORDER — METOPROLOL SUCCINATE 12.5 MG HALF TABLET
12.5000 mg | ORAL_TABLET | Freq: Two times a day (BID) | ORAL | Status: DC
  Administered 2019-05-16 – 2019-05-23 (×16): 12.5 mg via ORAL
  Filled 2019-05-16 (×16): qty 1

## 2019-05-16 MED ORDER — SALINE SPRAY 0.65 % NA SOLN
1.0000 | NASAL | Status: DC | PRN
  Administered 2019-05-16: 1 via NASAL
  Filled 2019-05-16: qty 44

## 2019-05-16 MED ORDER — SODIUM CHLORIDE 0.9 % IV SOLN
2.0000 g | Freq: Two times a day (BID) | INTRAVENOUS | Status: DC
  Administered 2019-05-16 (×2): 2 g via INTRAVENOUS
  Filled 2019-05-16 (×4): qty 2

## 2019-05-16 MED ORDER — CALCIUM CITRATE 950 (200 CA) MG PO TABS
200.0000 mg | ORAL_TABLET | Freq: Two times a day (BID) | ORAL | Status: DC
  Administered 2019-05-16 – 2019-05-22 (×13): 200 mg via ORAL
  Filled 2019-05-16 (×15): qty 1

## 2019-05-16 NOTE — Plan of Care (Signed)
  Problem: Fluid Volume: Goal: Hemodynamic stability will improve Outcome: Progressing   Problem: Education: Goal: Knowledge of General Education information will improve Description: Including pain rating scale, medication(s)/side effects and non-pharmacologic comfort measures Outcome: Progressing

## 2019-05-16 NOTE — Progress Notes (Signed)
[place] tech called to unit to have patient brought to radiology for paracentesis.  Was informed this was being done at bedside by critical care.   Order for US Paracentesis canceled at this time.   Brynda Greathouse, MS RD PA-C

## 2019-05-16 NOTE — Progress Notes (Signed)
Bed-side paracentesis completed by Dr. Loanne Drilling. Time out completed. Checklist performed and [facility] aware. VSS throughout procedure.  Clyde Canterbury, RN

## 2019-05-16 NOTE — Progress Notes (Signed)
PROGRESS NOTE    Richard Saunders  HRC:[id number] DOB: 1941-01-09 DOA: 05/06/2019 PCP: Tonia Ghent, MD   Brief Narrative: 79 year old with past medical history significant for aortic insufficiency, A. fib on Eliquis, hypertension, LBBB, PVCs, nonalcoholic liver cirrhosis, history of ascites status post IR paracentesis last year, who presents complaining of generalized weakness.  Patient was evaluated in the ED on 1/13 for abdominal pain and emesis, labs were drawn but patient left before being seen by a provider.  Labs returned showing elevated lipase at 20 541 and he was advised to return to the ED.  Patient received his first Covid vaccine on 1/13.  Since he received the vaccine he has not been feeling well.  Evaluation in the ED patient was found to be tachycardic heart rate in the 120s, tachypneic with respiratory rate in the 20s, hypotensive with blood pressure 85/67.  White count 24, lactic acid 4.3, potassium 5.5, creatinine 2.6.  Transaminases.  Chest x-ray showed mild left bibasilar atelectasis or infiltrate with associated pleural effusion.  CT abdomen showed advanced liver cirrhosis with moderate amount of ascites.  Peripancreatic fat concerning with low-level pancreatitis. She received IV fluids, started on IV antibiotics.  Continues to be hypotensive, CCM has been consulted.   Assessment & Plan:   Principal Problem:   Severe sepsis ([place]) Active Problems:   Atrial fibrillation (HCC)   Hepatic cirrhosis (HCC)   AKI (acute kidney injury) ([place])   Hyperkalemia   1-Sepsis, hypotension; patient has received more than 2.5 L of IV fluids, remained hypotensive, lactic acid at 4.  Chest x-ray with infiltrates.  CT with possible colitis.   CCM consulted. Continue with cefepime and flagyl.  Patient received albumin. C. difficile negative. BP improved. WBC trending down.   2-Acute pancreatitis;, continue with IV fluids.  Supportive care.  Pain management. Lipase normal, mild  elevation of AST.  CT abdomen: No calcified gallstone. Start clear diet.  Continue with support care.   3-Pleural effusion. Complaints of dyspnea this am. Place on 2 L.  Repeat chest x ray.  Follow ccm recommendation in regards thoracentesis.   4-AKI on CKD stage IIIa Prior creatinine 1.2 Present with a creatinine up to 2.6. Likely related to hypovolemia and sepsis and hypotension. Continue with IV fluids. Cr trending down.   Ascites, Cirrhosis liver;  Report worsening abdominal distension, and Dyspnea.  Will proceed with US paracentesis.  Will give albumin after paracentesis.  Transaminases, LFT trending down, bilirubin still elevated.   A fib; holding beta-blocker due to hypotension. Holding Eliquis in anticipation of thoracentesis, paracentesis.   Hyperkalemia; resolved.  Diarrhea, colitis;  Continue with fluid and IV Flagyl. C. difficile negative. Diarrhea improved.      Estimated body mass index is 25.42 kg/m as calculated from the following:   Height as of this encounter: 6' 1"  (1.854 m).   Weight as of this encounter: 87.4 kg.   DVT prophylaxis: resume eliquis.  Code Status: Full code Family Communication: care discussed with patient.  Disposition Plan: remain in the hospital;for management of sepsis.  Consultants:   CCM  Procedures:   none  Antimicrobials:  Cefepime Flagyl.   Subjective: Sitting in bed, report SOB and worsening abdominal distension.  Diarrhea decreasing in frequency, still watery/   Objective: Vitals:   05/15/19 1952 05/15/19 2337 05/16/19 0402 05/16/19 0751  BP: 110/76 109/66 102/66 108/74  Pulse: (!) 125 (!) 125 (!) 125 (!) 125  Resp: 18 20 19  (!) 21  Temp: 98.4 F (36.9 C) (!)  97.5 F (36.4 C) (!) 97.5 F (36.4 C) (!) 97.3 F (36.3 C)  TempSrc: Oral Oral Oral Oral  SpO2: 97% 92% 94% 97%  Weight:      Height:        Intake/Output Summary (Last 24 hours) at 05/16/2019 0817 Last data filed at 05/16/2019 0809 Gross  per 24 hour  Intake 1791.94 ml  Output 450 ml  Net 1341.94 ml   Filed Weights   05/02/2019 1336 05/13/2019 2143  Weight: 85.8 kg 87.4 kg    Examination:  General exam: NAD, appears uncomfortable  Respiratory system: Crackles bases Cardiovascular system: S 1, S 2 RRR Gastrointestinal system: BS present, soft, distended, no rigidity/  Central nervous system: alert, oriented, follows command Extremities: no edema Skin: No rashes   Data Reviewed: I have personally reviewed following labs and imaging studies  CBC: Recent Labs  Lab 05/13/19 0002 05/06/2019 1334 05/16/19 0411  WBC 14.6* 24.8* 14.3*  NEUTROABS  --  21.9*  --   HGB 15.9 17.2* 13.6  HCT 46.7 50.9 39.5  MCV 104.9* 105.6* 105.3*  PLT 218 190 938*   Basic Metabolic Panel: Recent Labs  Lab 05/13/19 0002 05/23/2019 1334 05/15/19 0030 05/16/19 0411  NA 141 137 138 138  K 4.1 5.5* 4.7 4.7  CL 104 101 109 109  CO2 26 23 18* 18*  GLUCOSE 168* 104* 110* 79  BUN 25* 58* 60* 69*  CREATININE 1.42* 2.69* 2.19* 1.76*  CALCIUM 9.5 7.3* 6.4* 7.1*   GFR: Estimated Creatinine Clearance: 39.1 mL/min (A) (by C-G formula based on SCr of 1.76 mg/dL (H)). Liver Function Tests: Recent Labs  Lab 05/13/19 0002 05/15/2019 1334 05/15/19 0030 05/16/19 0411  AST 57* 52* 36 48*  ALT 28 24 19 27   ALKPHOS 60 48 38 43  BILITOT 1.9* 3.4* 2.8* 2.9*  PROT 6.8 6.3* 4.9* 5.4*  ALBUMIN 3.5 3.0* 2.5* 2.7*   Recent Labs  Lab 05/13/19 0002 05/29/2019 1334  LIPASE 2,501* 101*   No results for input(s): AMMONIA in the last 168 hours. Coagulation Profile: Recent Labs  Lab 05/17/2019 1410  INR 2.8*   Cardiac Enzymes: No results for input(s): CKTOTAL, CKMB, CKMBINDEX, TROPONINI in the last 168 hours. BNP (last 3 results) Recent Labs    06/05/18 1356 06/11/18 1644  PROBNP 116.0* 81.0   HbA1C: No results for input(s): HGBA1C in the last 72 hours. CBG: No results for input(s): GLUCAP in the last 168 hours. Lipid Profile: No  results for input(s): CHOL, HDL, LDLCALC, TRIG, CHOLHDL, LDLDIRECT in the last 72 hours. Thyroid Function Tests: No results for input(s): TSH, T4TOTAL, FREET4, T3FREE, THYROIDAB in the last 72 hours. Anemia Panel: No results for input(s): VITAMINB12, FOLATE, FERRITIN, TIBC, IRON, RETICCTPCT in the last 72 hours. Sepsis Labs: Recent Labs  Lab 05/25/2019 1735 05/15/19 0030 05/15/19 0955 05/15/19 1321  LATICACIDVEN 3.8* 4.0* 3.2* 2.5*    Recent Results (from the past 240 hour(s))  Blood Culture (routine x 2)     Status: None (Preliminary result)   Collection Time: 05/20/2019  2:10 PM   Specimen: BLOOD  Result Value Ref Range Status   Specimen Description BLOOD RIGHT ANTECUBITAL  Final   Special Requests   Final    BOTTLES DRAWN AEROBIC ONLY Blood Culture results may not be optimal due to an inadequate volume of blood received in culture bottles   Culture   Final    NO GROWTH 2 DAYS Performed at [place],  [place] [address]    Report Status PENDING  Incomplete  Blood Culture (routine x 2)     Status: None (Preliminary result)   Collection Time: 05/02/2019  2:10 PM   Specimen: BLOOD  Result Value Ref Range Status   Specimen Description BLOOD LEFT ANTECUBITAL  Final   Special Requests   Final    BOTTLES DRAWN AEROBIC AND ANAEROBIC Blood Culture adequate volume   Culture   Final    NO GROWTH 2 DAYS Performed at [facility], [place] [address], [place], [place] [address]    Report Status PENDING  Incomplete  Respiratory Panel by RT PCR (Flu A&B, Covid) - Nasopharyngeal Swab     Status: None   Collection Time: 05/04/2019  2:20 PM   Specimen: Nasopharyngeal Swab  Result Value Ref Range Status   SARS Coronavirus 2 by RT PCR NEGATIVE NEGATIVE Final    Comment: (NOTE) SARS-CoV-2 target nucleic acids are NOT DETECTED. The SARS-CoV-2 RNA is generally detectable in upper respiratoy specimens during the acute phase of infection. The lowest concentration of  SARS-CoV-2 viral copies this assay can detect is 131 copies/mL. A negative result does not preclude SARS-Cov-2 infection and should not be used as the sole basis for treatment or other patient management decisions. A negative result may occur with  improper specimen collection/handling, submission of specimen other than nasopharyngeal swab, presence of viral mutation(s) within the areas targeted by this assay, and inadequate number of viral copies (<131 copies/mL). A negative result must be combined with clinical observations, patient history, and epidemiological information. The expected result is Negative. Fact Sheet for Patients:  PinkCheek.be Fact Sheet for Healthcare Providers:  GravelBags.it This test is not yet ap proved or cleared by the [place] FDA and  has been authorized for detection and/or diagnosis of SARS-CoV-2 by FDA under an Emergency Use Authorization (EUA). This EUA will remain  in effect (meaning this test can be used) for the duration of the COVID-19 declaration under Section 564(b)(1) of the Act, 21 U.S.C. section 360bbb-3(b)(1), unless the authorization is terminated or revoked sooner.    Influenza A by PCR NEGATIVE NEGATIVE Final   Influenza B by PCR NEGATIVE NEGATIVE Final    Comment: (NOTE) The Xpert Xpress SARS-CoV-2/FLU/RSV assay is intended as an aid in  the diagnosis of influenza from Nasopharyngeal swab specimens and  should not be used as a sole basis for treatment. Nasal washings and  aspirates are unacceptable for Xpert Xpress SARS-CoV-2/FLU/RSV  testing. Fact Sheet for Patients: PinkCheek.be Fact Sheet for Healthcare Providers: GravelBags.it This test is not yet approved or cleared by the [place] FDA and  has been authorized for detection and/or diagnosis of SARS-CoV-2 by  FDA under an Emergency Use Authorization (EUA).  This EUA will remain  in effect (meaning this test can be used) for the duration of the  Covid-19 declaration under Section 564(b)(1) of the Act, 21  U.S.C. section 360bbb-3(b)(1), unless the authorization is  terminated or revoked. Performed at [facility], [place] [address] [place], [place] [address]   C difficile quick scan w PCR reflex     Status: None   Collection Time: 05/15/19  1:35 AM   Specimen: STOOL  Result Value Ref Range Status   C Diff antigen NEGATIVE NEGATIVE Final   C Diff toxin NEGATIVE NEGATIVE Final   C Diff interpretation No C. difficile detected.  Final    Comment: Performed at [facility], [place],  [place] [address]         Radiology Studies: CT ABDOMEN PELVIS WO CONTRAST  Result Date: 05/17/2019 CLINICAL DATA:  Elevated lipase.  Abdominal pain and weakness. EXAM: CT ABDOMEN AND PELVIS WITHOUT CONTRAST TECHNIQUE: Multidetector CT imaging of the abdomen and pelvis was performed following the standard protocol without IV contrast. COMPARISON:  Abdominal ultrasound 03/12/2019 FINDINGS: Lower chest: A in bilateral effusions left larger than right. Atelectasis at the left lower lobe. Hepatobiliary: Advanced chronic cirrhosis.  No calcified gallstones. Pancreas: Mild edema surrounding the pancreas could go along with mild pancreatitis. No advanced inflammatory change. Spleen: Moderate splenomegaly without focal lesion. Free intraperitoneal fluid surrounds the spleen. Adrenals/Urinary Tract: Adrenal glands are normal. Kidneys are normal. Bladder is normal. Stomach/Bowel: No sign of bowel obstruction. There may be some bowel wall thickening, presumably secondary to the presence of diffuse ascites. Vascular/Lymphatic: Aortic atherosclerosis. No aneurysm. IVC is normal. No retroperitoneal adenopathy. Reproductive: Normal Other: Ascites, freely distributed. Musculoskeletal: Ordinary lumbar degenerative changes. IMPRESSION: Advanced cirrhosis of the  liver. Moderate amount of ascites distributed throughout the peritoneal space. Some edema of the peripancreatic fat could go along with low level pancreatitis. No advanced pancreatitis or pseudocyst. Aortic atherosclerosis. Bilateral pleural effusions left larger than right. Left lower lobe atelectasis. Electronically Signed   By: Nelson Chimes M.D.   On: 05/23/2019 18:30   DG Chest Port 1 View  Result Date: 05/15/2019 CLINICAL DATA:  Atrial fibrillation. EXAM: PORTABLE CHEST 1 VIEW COMPARISON:  June 18, 2018. FINDINGS: Stable cardiomediastinal silhouette. Right lung is clear. No pneumothorax is noted. Mild left basilar atelectasis or infiltrate is noted with associated pleural effusion. Bony thorax is unremarkable. IMPRESSION: Mild left basilar atelectasis or infiltrate is noted with associated pleural effusion. Electronically Signed   By: Marijo Conception M.D.   On: 05/08/2019 14:18        Scheduled Meds:  metoprolol succinate  12.5 mg Oral BID   Continuous Infusions:  ceFEPime (MAXIPIME) IV Stopped (05/15/19 1523)   lactated ringers 100 mL/hr at 05/16/19 0000   metronidazole 500 mg (05/16/19 0610)     LOS: 2 days    Time spent: 35 minutes.     Elmarie Shiley, MD [facility]   If 7PM-7AM, please contact night-coverage [email or web address] Password [facility] 05/16/2019, 8:17 AM

## 2019-05-16 NOTE — Procedures (Signed)
Paracentesis Procedure Note  Indications: Abdominal ascites  Procedure Details  Consent: Informed consent was obtained. Risks of the procedure were discussed including: infection, bleeding, pain, bowel perforation.  Maximum sterile technique was used including antiseptics, cap, gloves, gown, hand hygiene, mask and sheet. Skin prep: Chlorhexidine; local anesthetic administered. The abdominal wall was punctured in the left lower quadrant. Fluid was obtained without any difficulties and minimal blood loss.  A dressing was applied to the wound and wound care instructions were provided.   Findings 2.6 L ml of clear ascitic straw-colored fluid was obtained. A sample was sent to Pathology for cytology and cell counts, as well as for infection analysis.  Complications:  None; patient tolerated the procedure well. Normal blood pressure throughout. Prior to procedure patient with tachycardia in the 120s. Post-procedure tachycardia in the 120s.        Condition: stable  Rodman Pickle, M.D. [facility] 05/16/2019 3:33 PM

## 2019-05-16 NOTE — Progress Notes (Signed)
Updated patients brother Simona Huh with patients permission via phone.  Clyde Canterbury, RN

## 2019-05-16 NOTE — Progress Notes (Addendum)
NAME:  Richard Saunders, Richard Saunders, DOB:  Oct 10, 1940, LOS: 2 ADMISSION DATE:  05/23/2019, CONSULTATION DATE:  05/15/19 REFERRING MD: Shela Leff, MD  CHIEF COMPLAINT:  Hypotension  Brief History   79 year old male with cirrhosis who presented with N/V/D and admitted for sepsis secondary to GI etiology. PCCM consulted for hypotension.  History of present illness   [name] is a 79 year old male with the below medical history who presented to the ED for abdominal pain, nausea, vomiting and weakness on 1/13-1/14. He left AMA however his lab results after he left demonstrated elevated lipase 2501. He returned to the ED with similar symptoms as well as new onset diarrhea. Prior to admission he has had poor PO intake, decreased UOP and tachycardia in the last week. His toprolol was increased by his Cardiologist for tachycardia. Of note, he did receive COVID vaccination on 1/13 but states symptoms started prior to this.   In the ED he was hypotensive with BP 85/67. Labs significant for WBC 24.8, K 5.5, BUN/Cr 58/2.6 and LA 4.3. CT A/P with cirrhosis and ascites, pancreatitis and bilateral pleural effusion. ED [place] with no pocket seen for ascites drainage. Given IVF and broad spectrum antibiotics with improvement in hypotension.  1/16, patient with persistent hypotension. Has received total 5.5L overnight and on mIVF 125/hr.   Past Medical History  Atrial fibrillation on Eliquis, hx of PVS, aortic insufficiency, HTN, non-alcoholic cirrhosis, hx of ascites, anxiety/depression  Significant Hospital Events   1/13 Presented to ED with N/V/abdominal pain. Left AMA 1/15 Returned to ED  Consults:  [facility] PCCM  Procedures:    Significant Diagnostic Tests:  CT A/P Advanced cirrhosis of the liver, ascites, pancreatitis, bilateral pleural effusions  Micro Data:  BCX 1/15 NGTD C.diff 1/16 negative  Antimicrobials:  Vanc 1/15> Cefepime 1/15> Flagyl 1/15 >  Interim  history/subjective:  Improved hemodynamics overnight He remains tachycardic, SBP 100's this am Patient is alert and oriented, he ambulated from bed to bedside commode without dizziness or distress. His only complaint is generalized discomfort that he cannot pinpoint + 1.3 L overnight/net + 4.6 L this admission Lactate improving  Objective   Blood pressure 108/74, pulse (!) 125, temperature (!) 97.3 F (36.3 C), temperature source Oral, resp. rate (!) 21, height 6' 1"  (1.854 m), weight 87.4 kg, SpO2 97 %.        Intake/Output Summary (Last 24 hours) at 05/16/2019 0811 Last data filed at 05/16/2019 0809 Gross per 24 hour  Intake 1791.94 ml  Output 450 ml  Net 1341.94 ml   Filed Weights   05/12/2019 1336 05/29/2019 2143  Weight: 85.8 kg 87.4 kg    Physical Exam: General: Adult male, alert and oriented with no acute distress HENT: [place]/AT, mucous membranes moist Eyes:  EOMI, no scleral icterus Respiratory: 2 L [place], no increase work of breathing.   Cardiovascular: Tachycardic, regular rhythm.  Warm and well perfused, 1+  edema to BLE GI: BS +, rounded abdomen, soft and non tender Extremities: No deformities Neuro:A&O x 4, no focal deficits on exam, normal sensory Skin: Intact, no rashes or bruising Psych: calm and cooperative  Resolved Hospital Problem list     Assessment & Plan:   Hypovolemic hypotension - resolved responsive to IVF resuscitation. Likely related to volume loss from N/V/D Severe sepsis secondary to GI source Diarrhea C.diff negative --Continue abx: Cefepime & Flagyl  Pleural effusions --Oxygenating well on 2 L [place], wean for goal SpO2 > 90% --Patient  with bilateral effusions with recent Eliquis use.  No hypoxia or respiratory distress.  Likely not source of infectious process, risk of bleeding high with recent Eliquis.  No indication for thoracentesis at this time. Pleural effusions likely transudate in setting of ascites and recommend paracentesis for fluid  removal. Recommend holding further IVF.    Afib with RVR --Consider resuming home BB  AKI - improving --Baseline Cr 1.2  --Creatinine continues to improve, down to 1.7  Best practice:  Diet: Per primary Pain/Anxiety/Delirium protocol (if indicated): -- VAP protocol (if indicated): -- DVT prophylaxis: SCDs GI prophylaxis: -- Glucose control: Per primary Mobility: As tolerated Code Status: Full Disposition: PCCM will follow  Labs   CBC: Recent Labs  Lab 05/13/19 0002 05/19/2019 1334 05/16/19 0411  WBC 14.6* 24.8* 14.3*  NEUTROABS  --  21.9*  --   HGB 15.9 17.2* 13.6  HCT 46.7 50.9 39.5  MCV 104.9* 105.6* 105.3*  PLT 218 190 148*    Basic Metabolic Panel: Recent Labs  Lab 05/13/19 0002 05/03/2019 1334 05/15/19 0030 05/16/19 0411  NA 141 137 138 138  K 4.1 5.5* 4.7 4.7  CL 104 101 109 109  CO2 26 23 18* 18*  GLUCOSE 168* 104* 110* 79  BUN 25* 58* 60* 69*  CREATININE 1.42* 2.69* 2.19* 1.76*  CALCIUM 9.5 7.3* 6.4* 7.1*   GFR: Estimated Creatinine Clearance: 39.1 mL/min (A) (by C-G formula based on SCr of 1.76 mg/dL (H)). Recent Labs  Lab 05/13/19 0002 05/03/2019 1334 05/11/2019 1349 05/16/2019 1735 05/15/19 0030 05/15/19 0955 05/15/19 1321 05/16/19 0411  WBC 14.6* 24.8*  --   --   --   --   --  14.3*  LATICACIDVEN  --   --    < > 3.8* 4.0* 3.2* 2.5*  --    < > = values in this interval not displayed.    Liver Function Tests: Recent Labs  Lab 05/13/19 0002 05/21/2019 1334 05/15/19 0030 05/16/19 0411  AST 57* 52* 36 48*  ALT 28 24 19 27   ALKPHOS 60 48 38 43  BILITOT 1.9* 3.4* 2.8* 2.9*  PROT 6.8 6.3* 4.9* 5.4*  ALBUMIN 3.5 3.0* 2.5* 2.7*   Recent Labs  Lab 05/13/19 0002 05/01/2019 1334  LIPASE 2,501* 101*   No results for input(s): AMMONIA in the last 168 hours.  ABG    Component Value Date/Time   TCO2 24 07/25/2011 1411     Coagulation Profile: Recent Labs  Lab 05/27/2019 1410  INR 2.8*    Cardiac Enzymes: No results for input(s):  CKTOTAL, CKMB, CKMBINDEX, TROPONINI in the last 168 hours.  HbA1C: Hgb A1c MFr Bld  Date/Time Value Ref Range Status  07/29/2016 09:14 AM 5.2 4.6 - 6.5 % Final    Comment:    Glycemic Control Guidelines for People with Diabetes:Non Diabetic:  <6%Goal of Therapy: <7%Additional Action Suggested:  >8%   07/22/2012 09:41 AM 5.5 4.6 - 6.5 % Final    Comment:    Glycemic Control Guidelines for People with Diabetes:Non Diabetic:  <6%Goal of Therapy: <7%Additional Action Suggested:  >8%

## 2019-05-17 ENCOUNTER — Inpatient Hospital Stay (HOSPITAL_COMMUNITY): Payer: Medicare HMO

## 2019-05-17 ENCOUNTER — Encounter (HOSPITAL_COMMUNITY): Payer: Self-pay | Admitting: Internal Medicine

## 2019-05-17 DIAGNOSIS — J810 Acute pulmonary edema: Secondary | ICD-10-CM

## 2019-05-17 DIAGNOSIS — K652 Spontaneous bacterial peritonitis: Secondary | ICD-10-CM

## 2019-05-17 DIAGNOSIS — J9601 Acute respiratory failure with hypoxia: Secondary | ICD-10-CM

## 2019-05-17 DIAGNOSIS — J90 Pleural effusion, not elsewhere classified: Secondary | ICD-10-CM

## 2019-05-17 LAB — CBC
HCT: 42.1 % (ref 39.0–52.0)
Hemoglobin: 14.4 g/dL (ref 13.0–17.0)
MCH: 36 pg — ABNORMAL HIGH (ref 26.0–34.0)
MCHC: 34.2 g/dL (ref 30.0–36.0)
MCV: 105.3 fL — ABNORMAL HIGH (ref 80.0–100.0)
Platelets: 159 10*3/uL (ref 150–400)
RBC: 4 MIL/uL — ABNORMAL LOW (ref 4.22–5.81)
RDW: 13.4 % (ref 11.5–15.5)
WBC: 11.8 10*3/uL — ABNORMAL HIGH (ref 4.0–10.5)
nRBC: 0 % (ref 0.0–0.2)

## 2019-05-17 LAB — COMPREHENSIVE METABOLIC PANEL
ALT: 30 U/L (ref 0–44)
AST: 50 U/L — ABNORMAL HIGH (ref 15–41)
Albumin: 2.8 g/dL — ABNORMAL LOW (ref 3.5–5.0)
Alkaline Phosphatase: 51 U/L (ref 38–126)
Anion gap: 9 (ref 5–15)
BUN: 63 mg/dL — ABNORMAL HIGH (ref 8–23)
CO2: 19 mmol/L — ABNORMAL LOW (ref 22–32)
Calcium: 8 mg/dL — ABNORMAL LOW (ref 8.9–10.3)
Chloride: 109 mmol/L (ref 98–111)
Creatinine, Ser: 1.56 mg/dL — ABNORMAL HIGH (ref 0.61–1.24)
GFR calc Af Amer: 49 mL/min — ABNORMAL LOW (ref >60)
GFR calc non Af Amer: 42 mL/min — ABNORMAL LOW (ref >60)
Glucose, Bld: 91 mg/dL (ref 70–99)
Potassium: 4.9 mmol/L (ref 3.5–5.1)
Sodium: 137 mmol/L (ref 135–145)
Total Bilirubin: 2.3 mg/dL — ABNORMAL HIGH (ref 0.3–1.2)
Total Protein: 5.8 g/dL — ABNORMAL LOW (ref 6.5–8.1)

## 2019-05-17 LAB — HEPARIN LEVEL (UNFRACTIONATED): Heparin Unfractionated: 1.04 IU/mL — ABNORMAL HIGH (ref 0.30–0.70)

## 2019-05-17 MED ORDER — APIXABAN 5 MG PO TABS: 5.0000 mg | ORAL_TABLET | Freq: Two times a day (BID) | ORAL | Status: DC

## 2019-05-17 MED ORDER — ALBUMIN HUMAN 25 % IV SOLN
30.0000 g | Freq: Four times a day (QID) | INTRAVENOUS | Status: DC
  Administered 2019-05-17 – 2019-05-18 (×2): 30 g via INTRAVENOUS
  Filled 2019-05-17 (×4): qty 150

## 2019-05-17 MED ORDER — HEPARIN (PORCINE) 25000 UT/250ML-% IV SOLN
1200.0000 [IU]/h | INTRAVENOUS | Status: DC
  Administered 2019-05-17: 12:00:00 1200 [IU]/h via INTRAVENOUS
  Filled 2019-05-17: qty 250

## 2019-05-17 MED ORDER — ALBUMIN HUMAN 25 % IV SOLN
30.0000 g | Freq: Four times a day (QID) | INTRAVENOUS | Status: DC
  Administered 2019-05-17: 30 g via INTRAVENOUS
  Filled 2019-05-17 (×4): qty 150

## 2019-05-17 MED ORDER — IPRATROPIUM-ALBUTEROL 0.5-2.5 (3) MG/3ML IN SOLN
3.0000 mL | RESPIRATORY_TRACT | Status: DC | PRN
  Administered 2019-05-17 – 2019-05-19 (×2): 3 mL via RESPIRATORY_TRACT
  Filled 2019-05-17: qty 3

## 2019-05-17 MED ORDER — HEPARIN BOLUS VIA INFUSION
4000.0000 [IU] | Freq: Once | INTRAVENOUS | Status: AC
  Administered 2019-05-17: 4000 [IU] via INTRAVENOUS
  Filled 2019-05-17: qty 4000

## 2019-05-17 MED ORDER — SODIUM CHLORIDE 0.9 % IV SOLN
2.0000 g | INTRAVENOUS | Status: AC
  Administered 2019-05-17 – 2019-05-26 (×10): 2 g via INTRAVENOUS
  Filled 2019-05-17: qty 2
  Filled 2019-05-17 (×3): qty 20
  Filled 2019-05-17: qty 2
  Filled 2019-05-17 (×2): qty 20
  Filled 2019-05-17 (×4): qty 2

## 2019-05-17 MED ORDER — HEPARIN (PORCINE) 25000 UT/250ML-% IV SOLN
850.0000 [IU]/h | INTRAVENOUS | Status: DC
  Administered 2019-05-17: 23:00:00 850 [IU]/h via INTRAVENOUS

## 2019-05-17 NOTE — Progress Notes (Signed)
Pharmacy Antibiotic Note  Richard Saunders is a 79 y.o. male admitted on 05/13/2019 with sepsis.  Pharmacy has been consulted for cefepime dosing.   Pt is currently afebrile, WBC is just above normal at 11. SCr is now improved to 1.56.   Nothing significant seen on cultures.   Plan: Cefepime 2gm IV Q12H Metronidazole 500 q8 hours  Height: 6' 1"  (185.4 cm) Weight: 192 lb 11.2 oz (87.4 kg) IBW/kg (Calculated) : 79.9  Temp (24hrs), Avg:98 F (36.7 C), Min:97.3 F (36.3 C), Max:98.8 F (37.1 C)  Recent Labs  Lab 05/13/19 0002 05/16/2019 1334 05/16/2019 1349 05/21/2019 1735 05/15/19 0030 05/15/19 0955 05/15/19 1321 05/16/19 0411 05/17/19 0750  WBC 14.6* 24.8*  --   --   --   --   --  14.3* 11.8*  CREATININE 1.42* 2.69*  --   --  2.19*  --   --  1.76* 1.56*  LATICACIDVEN  --   --  4.3* 3.8* 4.0* 3.2* 2.5*  --   --     Estimated Creatinine Clearance: 44.1 mL/min (A) (by C-G formula based on SCr of 1.56 mg/dL (H)).    Allergies  Allergen Reactions  . Colchicine Diarrhea  . Fluticasone Propionate Other (See Comments)    He didn't tolerate the taste/smell prev  . Nasacort [Triamcinolone] Other (See Comments)    Didn't tolerate    Antimicrobials this admission: Vanc 1/15 x1 Cefepime 1/15>> Flagyl 1/15>>  Microbiology results: 1/15 BCX: ngtd 1/15 resp panel: neg 1/15 GI panel: sent  1/15 C dif: neg 1/16 Ucx: ng 1/17 peritoneal fluid - ngtd  Thank you for allowing pharmacy to be a part of this patient's care.  Erin Hearing PharmD., BCPS Clinical Pharmacist 05/17/2019 9:13 AM

## 2019-05-17 NOTE — Progress Notes (Signed)
Patient c/o partial loss of vision in Left eyewith spontaneous return of complete vision. NIHSS completed with no deficit noted. MD paged awaiting response.

## 2019-05-17 NOTE — Progress Notes (Addendum)
NAME:  Richard Saunders, MRN:  [id number], DOB:  1940/06/07, LOS: 3 ADMISSION DATE:  05/23/2019, CONSULTATION DATE:  05/15/19 REFERRING MD: Shela Leff, MD  CHIEF COMPLAINT:  Hypotension  Brief History   79 year old male with cirrhosis with SBP- presented with N/V/D and admitted for sepsis secondary to GI etiology. PCCM consulted for hypotension. Following for chronic left pleural effusion.  History of present illness   [name] is a 79 year old male with the below medical history who presented to the ED for abdominal pain, nausea, vomiting and weakness on 1/13-1/14. He left AMA however his lab results after he left demonstrated elevated lipase 2501. He returned to the ED with similar symptoms as well as new onset diarrhea. Prior to admission he has had poor PO intake, decreased UOP and tachycardia in the last week. His toprolol was increased by his Cardiologist for tachycardia. Of note, he did receive COVID vaccination on 1/13 but states symptoms started prior to this.   In the ED he was hypotensive with BP 85/67. Labs significant for WBC 24.8, K 5.5, BUN/Cr 58/2.6 and LA 4.3. CT A/P with cirrhosis and ascites, pancreatitis and bilateral pleural effusion. ED [place] with no pocket seen for ascites drainage. Given IVF and broad spectrum antibiotics with improvement in hypotension.  1/16, patient with persistent hypotension. Has received total 5.5L overnight and on mIVF 125/hr.   Past Medical History  Atrial fibrillation on Eliquis, hx of PVS, aortic insufficiency, HTN, non-alcoholic cirrhosis, hx of ascites, anxiety/depression  Significant Hospital Events   1/13 Presented to ED with N/V/abdominal pain. Left AMA 1/15 Returned to ED  Consults:  [facility] PCCM  Procedures:    Significant Diagnostic Tests:  CT A/P Advanced cirrhosis of the liver, ascites, pancreatitis, bilateral pleural effusions  Ascitic fluid 1/17: 1,365 WBCs (80% PMNs)   Micro Data:  BCX 1/15 >> C.diff 1/16  negative Ascitic fluid 1/17- few WBC >> Urine culture 1/16 >>NG  Antimicrobials:  Vanc 1/15> Cefepime 1/15> 1/17 Flagyl 1/15 > Ceftriaxone 1/18>>  Interim history/subjective:  Overall feels "bad", mostly due to weakness. Ongoing abdominal distention, which developed progressively over time, not right before admission.   Objective   Blood pressure 110/90, pulse (!) 124, temperature 97.8 F (36.6 C), temperature source Oral, resp. rate 20, height 6' 1"  (1.854 m), weight 87.4 kg, SpO2 95 %.        Intake/Output Summary (Last 24 hours) at 05/17/2019 1629 Last data filed at 05/17/2019 1300 Gross per 24 hour  Intake 360 ml  Output 500 ml  Net -140 ml   Filed Weights   05/12/2019 1336 05/08/2019 2143  Weight: 85.8 kg 87.4 kg    Physical Exam: General: Lying in bed no acute distress, chronically ill-appearing HENT: [place]/AT, eyes anictericicterus Respiratory: Tachypnea, mild wheezing on the right.  Decreased basilar breath sounds on the left.  Able to speak in full sentences. Cardiovascular: Tachycardic, regular rhythm.  No significant lower extremity edema. GI: Abdomen soft, minimally tender to palpation.  Nondistended. Extremities: No deformities, clubbing or cyanosis Neuro: Alert, answering questions appropriately.  Globally weak Skin: Pallor, no bruising or rashes Psych: Cooperative with exam  CXR- personally reviewed.  Stable left pleural effusion.  Left pleural effusion present on all chest x-rays since 2018, although increased in size in 2020 and subsequently compared to 2018.   Resolved Hospital Problem list     Assessment & Plan:   Acute hypoxic respiratory failure- likely due to abdominal distention from ascites.  Chronic  left pleural effusion potentially plays a role.  Acute pulmonary edema from volume resuscitation is also likely. -Continue supplemental oxygen as required to maintain SPO2 greater than 90% -Will likely need large-volume paracentesis, but would recommend  waiting until kidney function stabilizes and signs of sepsis improved if possible. -Discussed volume repletion with albumin for SBP protocol with Dr. Tyrell Antonio- 1.5g/kg albumin on day 1; 1.0g/kg on day 3. -DuoNebs every 4 hours as needed for wheezing -Recommend holding additional IV fluid resuscitation -Following paracentesis, would likely benefit from diagnostic left thoracentesis, but this should be done following repeat paracentesis.  Hypovolemic hypotension - resolved responsive to IVF resuscitation. Likely related to volume loss from N/V/D Severe sepsis secondary to SBP Diarrhea C.diff negative -Continue abx: Cefepime & Flagyl  Afib with RVR --Consider resuming home BB  Decompensated Cirrhosis with ascites, SBP. Hyperbilirubinemia improving. -ascitic fluid path pending  AKI, likely prerenal-continuing to improve. Baseline Cr 1.2. -Continue to monitor  We will continue to follow.  Best practice:  Diet: Per primary Pain/Anxiety/Delirium protocol (if indicated): -- VAP protocol (if indicated): -- DVT prophylaxis: SCDs GI prophylaxis: -- Glucose control: Per primary Mobility: As tolerated Code Status: Full Disposition:    Labs   CBC: Recent Labs  Lab 05/13/19 0002 05/16/2019 1334 05/16/19 0411 05/17/19 0750  WBC 14.6* 24.8* 14.3* 11.8*  NEUTROABS  --  21.9*  --   --   HGB 15.9 17.2* 13.6 14.4  HCT 46.7 50.9 39.5 42.1  MCV 104.9* 105.6* 105.3* 105.3*  PLT 218 190 148* 967    Basic Metabolic Panel: Recent Labs  Lab 05/13/19 0002 05/29/2019 1334 05/15/19 0030 05/16/19 0411 05/17/19 0750  NA 141 137 138 138 137  K 4.1 5.5* 4.7 4.7 4.9  CL 104 101 109 109 109  CO2 26 23 18* 18* 19*  GLUCOSE 168* 104* 110* 79 91  BUN 25* 58* 60* 69* 63*  CREATININE 1.42* 2.69* 2.19* 1.76* 1.56*  CALCIUM 9.5 7.3* 6.4* 7.1* 8.0*   GFR: Estimated Creatinine Clearance: 44.1 mL/min (A) (by C-G formula based on SCr of 1.56 mg/dL (H)). Recent Labs  Lab 05/13/19 0002 05/23/2019  1334 05/30/2019 1349 05/19/2019 1735 05/15/19 0030 05/15/19 0955 05/15/19 1321 05/16/19 0411 05/17/19 0750  WBC 14.6* 24.8*  --   --   --   --   --  14.3* 11.8*  LATICACIDVEN  --   --    < > 3.8* 4.0* 3.2* 2.5*  --   --    < > = values in this interval not displayed.    Liver Function Tests: Recent Labs  Lab 05/13/19 0002 05/23/2019 1334 05/15/19 0030 05/16/19 0411 05/17/19 0750  AST 57* 52* 36 48* 50*  ALT 28 24 19 27 30   ALKPHOS 60 48 38 43 51  BILITOT 1.9* 3.4* 2.8* 2.9* 2.3*  PROT 6.8 6.3* 4.9* 5.4* 5.8*  ALBUMIN 3.5 3.0* 2.5* 2.8*  2.7* 2.8*   Recent Labs  Lab 05/13/19 0002 05/26/2019 1334  LIPASE 2,501* 101*   No results for input(s): AMMONIA in the last 168 hours.  ABG    Component Value Date/Time   TCO2 24 07/25/2011 1411     Coagulation Profile: Recent Labs  Lab 05/11/2019 1410  INR 2.8*    Cardiac Enzymes: No results for input(s): CKTOTAL, CKMB, CKMBINDEX, TROPONINI in the last 168 hours.  HbA1C: Hgb A1c MFr Bld  Date/Time Value Ref Range Status  07/29/2016 09:14 AM 5.2 4.6 - 6.5 % Final    Comment:  Glycemic Control Guidelines for People with Diabetes:Non Diabetic:  <6%Goal of Therapy: <7%Additional Action Suggested:  >8%   07/22/2012 09:41 AM 5.5 4.6 - 6.5 % Final    Comment:    Glycemic Control Guidelines for People with Diabetes:Non Diabetic:  <6%Goal of Therapy: <7%Additional Action Suggested:  >8%       Julian Hy, DO 05/17/19 4:43 PM [facility]

## 2019-05-17 NOTE — Progress Notes (Signed)
ANTICOAGULATION CONSULT NOTE - Initial Consult  Pharmacy Consult for heparin Indication: atrial fibrillation  Allergies  Allergen Reactions  . Colchicine Diarrhea  . Fluticasone Propionate Other (See Comments)    He didn't tolerate the taste/smell prev  . Nasacort [Triamcinolone] Other (See Comments)    Didn't tolerate    Patient Measurements: Height: 6' 1"  (185.4 cm) Weight: 192 lb 11.2 oz (87.4 kg) IBW/kg (Calculated) : 79.9 Heparin Dosing Weight: 87kg  Vital Signs: Temp: 97.3 F (36.3 C) (01/18 0812) Temp Source: Oral (01/18 0812) BP: 114/49 (01/18 0812) Pulse Rate: 123 (01/18 0812)  Labs: Recent Labs    05/19/2019 1334 05/03/2019 1334 05/02/2019 1410 05/15/19 0030 05/15/19 0030 05/15/19 0605 05/15/19 0955 05/15/19 1321 05/16/19 0411 05/17/19 0750  HGB 17.2*   < >  --   --   --   --   --   --  13.6 14.4  HCT 50.9  --   --   --   --   --   --   --  39.5 42.1  PLT 190  --   --   --   --   --   --   --  148* 159  APTT  --   --  40*  --   --   --   --   --   --   --   LABPROT  --   --  29.1*  --   --   --   --   --   --   --   INR  --   --  2.8*  --   --   --   --   --   --   --   CREATININE 2.69*   < >  --  2.19*  --   --   --   --  1.76* 1.56*  TROPONINIHS  --   --   --  60*   < > 50* 50* 41*  --   --    < > = values in this interval not displayed.    Estimated Creatinine Clearance: 44.1 mL/min (A) (by C-G formula based on SCr of 1.56 mg/dL (H)).   Medical History: Past Medical History:  Diagnosis Date  . Allergy   . Anxiety   . Aortic insufficiency    a. 04/2018 Echo: EF 55-60%. Mild to Mod MR. Mild AI/TR.  Marland Kitchen Atrial fibrillation ([place])    a. Dx 04/2018. CHA2DS2VASc = 4-->Eliquis.  . Depression   . H/O: rheumatic fever 2012   (per patient) - eval by [facility] LBBB with PVC's  . History of chicken pox   . Hypertension   . LBBB (left bundle branch block)   . Organic impotence   . PVC's (premature ventricular contractions)    Assessment: 79 year old  male undergoing medical workup and may need thoracentesis but decision is pending. Patient with history of afib and has been off his apixaban. Discussed with primary team today, given the possible need of invasive procedure, will hold off on starting apixaban and start IV heparin in the interim which can easily be turned off/on as needed.   Appears last dose of apixaban was on 1/15, will check aptt with initial labs in case effects are lingering.   Goal of Therapy:  Heparin level 0.3-0.7 units/ml Monitor platelets by anticoagulation protocol: Yes   Plan:  Give 4000 units bolus x 1 Start heparin infusion at 1200 units/hr Check anti-Xa level in 8  hours and daily while on heparin Continue to monitor H&H and platelets  Erin Hearing PharmD., BCPS Clinical Pharmacist 05/17/2019 9:32 AM

## 2019-05-17 NOTE — Progress Notes (Signed)
PROGRESS NOTE    Richard Saunders  MVE:[id number] DOB: [birth year] DOA: 05/05/2019 PCP: Tonia Ghent, MD   Brief Narrative: 79 year old with past medical history significant for aortic insufficiency, A. fib on Eliquis, hypertension, LBBB, PVCs, nonalcoholic liver cirrhosis, history of ascites status post IR paracentesis last year, who presents complaining of generalized weakness.  Patient was evaluated in the ED on 1/13 for abdominal pain and emesis, labs were drawn but patient left before being seen by a provider.  Labs returned showing elevated lipase at 20 541 and he was advised to return to the ED.  Patient received his first Covid vaccine on 1/13.  Since he received the vaccine he has not been feeling well.  Evaluation in the ED patient was found to be tachycardic heart rate in the 120s, tachypneic with respiratory rate in the 20s, hypotensive with blood pressure 85/67.  White count 24, lactic acid 4.3, potassium 5.5, creatinine 2.6.  Transaminases.  Chest x-ray showed mild left bibasilar atelectasis or infiltrate with associated pleural effusion.  CT abdomen showed advanced liver cirrhosis with moderate amount of ascites.  Peripancreatic fat concerning with low-level pancreatitis. She received IV fluids, started on IV antibiotics.  Continues to be hypotensive, CCM has been consulted.   Assessment & Plan:   Principal Problem:   Severe sepsis ([place]) Active Problems:   Atrial fibrillation (HCC)   Hepatic cirrhosis (HCC)   AKI (acute kidney injury) ([place])   Hyperkalemia   1-Sepsis, related to SBP and Colitis.   hypotension; patient has received more than 2.5 L of IV fluids, remained hypotensive, lactic acid at 4.  Chest x-ray with infiltrates.  CT with possible colitis.   CCM consulted. Continue with flagyl. Change cefepime to ceftriaxone to better cover SBP C. difficile negative. BP improved. WBC trending down.   2-Acute pancreatitis;, continue with IV fluids.  Supportive care.   Pain management. Lipase normal, mild elevation of AST.  CT abdomen: No calcified gallstone. Start clear diet.  Continue with support care.  Advance diet   3-Pleural effusion. Complaints of dyspnea this am. Place on 2 L.  Repeat chest x ray.  Follow ccm recommendation in regards thoracentesis.   4-AKI on CKD stage IIIa Prior creatinine 1.2 Present with a creatinine up to 2.6. Likely related to hypovolemia and sepsis and hypotension. Received IV fluids.  Cr trending down.   Ascites, Cirrhosis liver;  Report worsening abdominal distension, and Dyspnea.  Will proceed with US paracentesis.  Albumin 30 g every 6 hours 4 doses first day,. Then will do 40 mg BID for 2 days.  Transaminases, LFT trending down, bilirubin still elevated.   A fib; resume BB Holding Eliquis in anticipation of thoracentesis, paracentesis.  Start Heparin Gtt.   Hyperkalemia; resolved.  Diarrhea, colitis;  Continue with fluid and IV Flagyl. Ceftriaxone.  C. difficile negative. Diarrhea improved.   Vision loss; transient. Discussed with neurology, plan to get MRI which was negative for acute stroke showed old micro hemorrhage. Ok to use anticoagulation per neurology.      Estimated body mass index is 25.42 kg/m as calculated from the following:   Height as of this encounter: 6' 1"  (1.854 m).   Weight as of this encounter: 87.4 kg.   DVT prophylaxis: on heparin  Code Status: Full code Family Communication: care discussed with patient.  Disposition Plan: remain in the hospital;for management of sepsis.  Consultants:   CCM  Procedures:   none  Antimicrobials:  Cefepime Flagyl.   Subjective: He is  feeling better. Had transient episode of vision loss , lower filed left eye  Objective: Vitals:   05/16/19 1942 05/16/19 2334 05/17/19 0315 05/17/19 0812  BP: 116/80 116/87 113/76 (!) 114/49  Pulse: (!) 123 (!) 120 (!) 120 (!) 123  Resp: 20 19 18 20   Temp: 98.5 F (36.9 C) 98 F (36.7 C)  97.6 F (36.4 C) (!) 97.3 F (36.3 C)  TempSrc: Axillary Oral Oral Oral  SpO2: 97% 95% 96% 96%  Weight:      Height:        Intake/Output Summary (Last 24 hours) at 05/17/2019 0903 Last data filed at 05/17/2019 0813 Gross per 24 hour  Intake 700.25 ml  Output 450 ml  Net 250.25 ml   Filed Weights   05/09/2019 1336 05/27/2019 2143  Weight: 85.8 kg 87.4 kg    Examination:  General exam: NAD Respiratory system: Crackles bases Cardiovascular system: S 1, S 2 RRR Gastrointestinal system: BS present, soft, less distended.  Central nervous system: alert, non focal.  Extremities:No edema Skin: No rashes   Data Reviewed: I have personally reviewed following labs and imaging studies  CBC: Recent Labs  Lab 05/13/19 0002 05/09/2019 1334 05/16/19 0411 05/17/19 0750  WBC 14.6* 24.8* 14.3* 11.8*  NEUTROABS  --  21.9*  --   --   HGB 15.9 17.2* 13.6 14.4  HCT 46.7 50.9 39.5 42.1  MCV 104.9* 105.6* 105.3* 105.3*  PLT 218 190 148* 007   Basic Metabolic Panel: Recent Labs  Lab 05/13/19 0002 05/10/2019 1334 05/15/19 0030 05/16/19 0411 05/17/19 0750  NA 141 137 138 138 137  K 4.1 5.5* 4.7 4.7 4.9  CL 104 101 109 109 109  CO2 26 23 18* 18* 19*  GLUCOSE 168* 104* 110* 79 91  BUN 25* 58* 60* 69* 63*  CREATININE 1.42* 2.69* 2.19* 1.76* 1.56*  CALCIUM 9.5 7.3* 6.4* 7.1* 8.0*   GFR: Estimated Creatinine Clearance: 44.1 mL/min (A) (by C-G formula based on SCr of 1.56 mg/dL (H)). Liver Function Tests: Recent Labs  Lab 05/13/19 0002 05/25/2019 1334 05/15/19 0030 05/16/19 0411 05/17/19 0750  AST 57* 52* 36 48* 50*  ALT 28 24 19 27 30   ALKPHOS 60 48 38 43 51  BILITOT 1.9* 3.4* 2.8* 2.9* 2.3*  PROT 6.8 6.3* 4.9* 5.4* 5.8*  ALBUMIN 3.5 3.0* 2.5* 2.8*  2.7* 2.8*   Recent Labs  Lab 05/13/19 0002 05/07/2019 1334  LIPASE 2,501* 101*   No results for input(s): AMMONIA in the last 168 hours. Coagulation Profile: Recent Labs  Lab 05/12/2019 1410  INR 2.8*   Cardiac Enzymes: No  results for input(s): CKTOTAL, CKMB, CKMBINDEX, TROPONINI in the last 168 hours. BNP (last 3 results) Recent Labs    06/05/18 1356 06/11/18 1644  PROBNP 116.0* 81.0   HbA1C: No results for input(s): HGBA1C in the last 72 hours. CBG: No results for input(s): GLUCAP in the last 168 hours. Lipid Profile: No results for input(s): CHOL, HDL, LDLCALC, TRIG, CHOLHDL, LDLDIRECT in the last 72 hours. Thyroid Function Tests: No results for input(s): TSH, T4TOTAL, FREET4, T3FREE, THYROIDAB in the last 72 hours. Anemia Panel: No results for input(s): VITAMINB12, FOLATE, FERRITIN, TIBC, IRON, RETICCTPCT in the last 72 hours. Sepsis Labs: Recent Labs  Lab 05/11/2019 1735 05/15/19 0030 05/15/19 0955 05/15/19 1321  LATICACIDVEN 3.8* 4.0* 3.2* 2.5*    Recent Results (from the past 240 hour(s))  Blood Culture (routine x 2)     Status: None (Preliminary result)   Collection Time:  05/13/2019  2:10 PM   Specimen: BLOOD  Result Value Ref Range Status   Specimen Description BLOOD RIGHT ANTECUBITAL  Final   Special Requests   Final    BOTTLES DRAWN AEROBIC ONLY Blood Culture results may not be optimal due to an inadequate volume of blood received in culture bottles   Culture   Final    NO GROWTH 2 DAYS Performed at [facility], [place] [address], [place], [place] [address]    Report Status PENDING  Incomplete  Blood Culture (routine x 2)     Status: None (Preliminary result)   Collection Time: 05/12/2019  2:10 PM   Specimen: BLOOD  Result Value Ref Range Status   Specimen Description BLOOD LEFT ANTECUBITAL  Final   Special Requests   Final    BOTTLES DRAWN AEROBIC AND ANAEROBIC Blood Culture adequate volume   Culture   Final    NO GROWTH 2 DAYS Performed at [facility], [place] [address] [place],  [address]    Report Status PENDING  Incomplete  Respiratory Panel by RT PCR (Flu A&B, Covid) - Nasopharyngeal Swab     Status: None   Collection Time: 05/15/2019  2:20 PM   Specimen:  Nasopharyngeal Swab  Result Value Ref Range Status   SARS Coronavirus 2 by RT PCR NEGATIVE NEGATIVE Final    Comment: (NOTE) SARS-CoV-2 target nucleic acids are NOT DETECTED. The SARS-CoV-2 RNA is generally detectable in upper respiratoy specimens during the acute phase of infection. The lowest concentration of SARS-CoV-2 viral copies this assay can detect is 131 copies/mL. A negative result does not preclude SARS-Cov-2 infection and should not be used as the sole basis for treatment or other patient management decisions. A negative result may occur with  improper specimen collection/handling, submission of specimen other than nasopharyngeal swab, presence of viral mutation(s) within the areas targeted by this assay, and inadequate number of viral copies (<131 copies/mL). A negative result must be combined with clinical observations, patient history, and epidemiological information. The expected result is Negative. Fact Sheet for Patients:  PinkCheek.be Fact Sheet for Healthcare Providers:  GravelBags.it This test is not yet ap proved or cleared by the [place] FDA and  has been authorized for detection and/or diagnosis of SARS-CoV-2 by FDA under an Emergency Use Authorization (EUA). This EUA will remain  in effect (meaning this test can be used) for the duration of the COVID-19 declaration under Section 564(b)(1) of the Act, 21 U.S.C. section 360bbb-3(b)(1), unless the authorization is terminated or revoked sooner.    Influenza A by PCR NEGATIVE NEGATIVE Final   Influenza B by PCR NEGATIVE NEGATIVE Final    Comment: (NOTE) The Xpert Xpress SARS-CoV-2/FLU/RSV assay is intended as an aid in  the diagnosis of influenza from Nasopharyngeal swab specimens and  should not be used as a sole basis for treatment. Nasal washings and  aspirates are unacceptable for Xpert Xpress SARS-CoV-2/FLU/RSV  testing. Fact Sheet for  Patients: PinkCheek.be Fact Sheet for Healthcare Providers: GravelBags.it This test is not yet approved or cleared by the [place] FDA and  has been authorized for detection and/or diagnosis of SARS-CoV-2 by  FDA under an Emergency Use Authorization (EUA). This EUA will remain  in effect (meaning this test can be used) for the duration of the  Covid-19 declaration under Section 564(b)(1) of the Act, 21  U.S.C. section 360bbb-3(b)(1), unless the authorization is  terminated or revoked. Performed at [facility], [place]  [address] [place], [place] [address]   C difficile quick scan w PCR reflex     Status: None   Collection Time: 05/15/19  1:35 AM   Specimen: STOOL  Result Value Ref Range Status   C Diff antigen NEGATIVE NEGATIVE Final   C Diff toxin NEGATIVE NEGATIVE Final   C Diff interpretation No C. difficile detected.  Final    Comment: Performed at [facility], [place] [address], [place], [place] [address]  Urine culture     Status: None   Collection Time: 05/15/19  9:10 AM   Specimen: In/Out Cath Urine  Result Value Ref Range Status   Specimen Description IN/OUT CATH URINE  Final   Special Requests NONE  Final   Culture   Final    NO GROWTH Performed at [facility], [place] [address], [place], [place] [address]    Report Status 05/16/2019 FINAL  Final  Body fluid culture     Status: None (Preliminary result)   Collection Time: 05/16/19  3:12 PM   Specimen: Peritoneal Washings; Body Fluid  Result Value Ref Range Status   Specimen Description PERITONEAL  Final   Special Requests Normal  Final   Gram Stain   Final    FEW WBC PRESENT,BOTH PMN AND MONONUCLEAR NO ORGANISMS SEEN Performed at [facility], [address], [place],  [address]    Culture PENDING  Incomplete   Report Status PENDING  Incomplete         Radiology Studies: DG CHEST PORT 1 VIEW  Result Date:  05/17/2019 CLINICAL DATA:  Shortness of breath. Pleural effusion. EXAM: PORTABLE CHEST 1 VIEW COMPARISON:  May 16, 2019 FINDINGS: No pneumothorax. The right lung is clear. The left-sided pleural effusion with underlying opacity is stable. The cardiomediastinal silhouette is stable. IMPRESSION: Stable left-sided pleural effusion with underlying opacity. Electronically Signed   By: Dorise Bullion III M.D   On: 05/17/2019 08:20   DG CHEST PORT 1 VIEW  Result Date: 05/16/2019 CLINICAL DATA:  Shortness of breath, abdominal pain and vomiting. History of cirrhosis. EXAM: PORTABLE CHEST 1 VIEW COMPARISON:  Chest x-ray 05/01/2019 FINDINGS: The heart is upper limits of normal in size and stable. There is moderate tortuosity and ectasia of the thoracic aorta. Persistent and slightly enlarging left pleural effusion with overlying atelectasis. Mild central vascular congestion without overt pulmonary edema. The bony thorax is intact. IMPRESSION: Persistent and slightly enlarging left pleural effusion with overlying atelectasis. Electronically Signed   By: Marijo Sanes M.D.   On: 05/16/2019 09:09        Scheduled Meds: . calcium citrate  200 mg of elemental calcium Oral BID  . metoprolol succinate  12.5 mg Oral BID   Continuous Infusions: . ceFEPime (MAXIPIME) IV 2 g (05/16/19 2254)  . metronidazole 500 mg (05/17/19 0607)     LOS: 3 days    Time spent: 35 minutes.     Elmarie Shiley, MD [facility]   If 7PM-7AM, please contact night-coverage [email or web address] Password [facility] 05/17/2019, 9:03 AM

## 2019-05-17 NOTE — Progress Notes (Addendum)
ANTICOAGULATION CONSULT NOTE - Initial Consult  Pharmacy Consult for heparin Indication: atrial fibrillation  Allergies  Allergen Reactions  . Colchicine Diarrhea  . Fluticasone Propionate Other (See Comments)    He didn't tolerate the taste/smell prev  . Nasacort [Triamcinolone] Other (See Comments)    Didn't tolerate    Patient Measurements: Height: 6' 1"  (185.4 cm) Weight: 192 lb 11.2 oz (87.4 kg) IBW/kg (Calculated) : 79.9 Heparin Dosing Weight: 87kg  Vital Signs: Temp: 98 F (36.7 C) (01/18 2003) Temp Source: Oral (01/18 2003) BP: 113/84 (01/18 2003) Pulse Rate: 122 (01/18 2003)  Labs: Recent Labs    05/15/19 0030 05/15/19 0030 05/15/19 0605 05/15/19 0955 05/15/19 1321 05/16/19 0411 05/17/19 0750 05/17/19 2009  HGB  --   --   --   --   --  13.6 14.4  --   HCT  --   --   --   --   --  39.5 42.1  --   PLT  --   --   --   --   --  148* 159  --   APTT  --   --   --   --   --   --   --  >200*  HEPARINUNFRC  --   --   --   --   --   --   --  1.04*  CREATININE 2.19*  --   --   --   --  1.76* 1.56*  --   TROPONINIHS 60*   < > 50* 50* 41*  --   --   --    < > = values in this interval not displayed.    Estimated Creatinine Clearance: 44.1 mL/min (A) (by C-G formula based on SCr of 1.56 mg/dL (H)).   Medical History: Past Medical History:  Diagnosis Date  . Allergy   . Anxiety   . Aortic insufficiency    a. 04/2018 Echo: EF 55-60%. Mild to Mod MR. Mild AI/TR.  Marland Kitchen Atrial fibrillation ([place])    a. Dx 04/2018. CHA2DS2VASc = 4-->Eliquis.  . Cirrhosis ([place])   . Depression   . H/O: rheumatic fever 2012   (per patient) - eval by [facility] LBBB with PVC's  . History of chicken pox   . Hypertension   . LBBB (left bundle branch block)   . Organic impotence   . PVC's (premature ventricular contractions)    Assessment: 79 year old male undergoing medical workup and may need thoracentesis but decision is pending. Patient with history of afib and has been off his  apixaban. Discussed with primary team today, given the possible need of invasive procedure, will hold off on starting apixaban and start IV heparin in the interim which can easily be turned off/on as needed.   Initial heparin level and aPTT both elevated. No S/Sx bleeding per RN, appears labs were drawn appropriately. Difficult to discern if aPTT/heparin level are correlating since both grossly elevated - will recheck both.  Goal of Therapy:  Heparin level 0.3-0.7 units/ml Monitor platelets by anticoagulation protocol: Yes   Plan:  -Hold heparin x1hr then restart infusion at 850 units/h -Recheck heparin level and aPTT in [place], PharmD, BCPS Clinical Pharmacist [phone] Please check AMION for all [place] numbers 05/17/2019

## 2019-05-18 ENCOUNTER — Inpatient Hospital Stay (HOSPITAL_COMMUNITY): Payer: Medicare HMO

## 2019-05-18 DIAGNOSIS — K7460 Unspecified cirrhosis of liver: Secondary | ICD-10-CM

## 2019-05-18 DIAGNOSIS — J918 Pleural effusion in other conditions classified elsewhere: Secondary | ICD-10-CM

## 2019-05-18 DIAGNOSIS — J90 Pleural effusion, not elsewhere classified: Secondary | ICD-10-CM

## 2019-05-18 DIAGNOSIS — R188 Other ascites: Secondary | ICD-10-CM

## 2019-05-18 DIAGNOSIS — Z9889 Other specified postprocedural states: Secondary | ICD-10-CM

## 2019-05-18 LAB — BASIC METABOLIC PANEL
Anion gap: 12 (ref 5–15)
BUN: 66 mg/dL — ABNORMAL HIGH (ref 8–23)
CO2: 21 mmol/L — ABNORMAL LOW (ref 22–32)
Calcium: 8.6 mg/dL — ABNORMAL LOW (ref 8.9–10.3)
Chloride: 108 mmol/L (ref 98–111)
Creatinine, Ser: 1.43 mg/dL — ABNORMAL HIGH (ref 0.61–1.24)
GFR calc Af Amer: 54 mL/min — ABNORMAL LOW (ref >60)
GFR calc non Af Amer: 47 mL/min — ABNORMAL LOW (ref >60)
Glucose, Bld: 83 mg/dL (ref 70–99)
Potassium: 4.7 mmol/L (ref 3.5–5.1)
Sodium: 141 mmol/L (ref 135–145)

## 2019-05-18 LAB — ALBUMIN, PLEURAL OR PERITONEAL FLUID
Albumin, Fluid: <1 g/dL
Albumin, Fluid: <1 g/dL
Albumin, Fluid: <1 g/dL
Albumin, Fluid: <1 g/dL

## 2019-05-18 LAB — CBC
HCT: 37.9 % — ABNORMAL LOW (ref 39.0–52.0)
Hemoglobin: 12.8 g/dL — ABNORMAL LOW (ref 13.0–17.0)
MCH: 35.8 pg — ABNORMAL HIGH (ref 26.0–34.0)
MCHC: 33.8 g/dL (ref 30.0–36.0)
MCV: 105.9 fL — ABNORMAL HIGH (ref 80.0–100.0)
Platelets: 131 10*3/uL — ABNORMAL LOW (ref 150–400)
RBC: 3.58 MIL/uL — ABNORMAL LOW (ref 4.22–5.81)
RDW: 13.3 % (ref 11.5–15.5)
WBC: 12.1 10*3/uL — ABNORMAL HIGH (ref 4.0–10.5)
nRBC: 0 % (ref 0.0–0.2)

## 2019-05-18 LAB — GI PATHOGEN PANEL BY PCR, STOOL

## 2019-05-18 LAB — LACTATE DEHYDROGENASE, PLEURAL OR PERITONEAL FLUID
LD, Fluid: 112 U/L — ABNORMAL HIGH (ref 3–23)
LD, Fluid: 122 U/L — ABNORMAL HIGH (ref 3–23)

## 2019-05-18 LAB — APTT
aPTT: >200 seconds (ref 24–36)
aPTT: 144 seconds — ABNORMAL HIGH (ref 24–36)

## 2019-05-18 LAB — HEPATIC FUNCTION PANEL
ALT: 25 U/L (ref 0–44)
AST: 44 U/L — ABNORMAL HIGH (ref 15–41)
Albumin: 3.8 g/dL (ref 3.5–5.0)
Alkaline Phosphatase: 41 U/L (ref 38–126)
Bilirubin, Direct: 0.5 mg/dL — ABNORMAL HIGH (ref 0.0–0.2)
Indirect Bilirubin: 1.4 mg/dL — ABNORMAL HIGH (ref 0.3–0.9)
Total Bilirubin: 1.9 mg/dL — ABNORMAL HIGH (ref 0.3–1.2)
Total Protein: 6 g/dL — ABNORMAL LOW (ref 6.5–8.1)

## 2019-05-18 LAB — PROTIME-INR
INR: 1.9 — ABNORMAL HIGH (ref 0.8–1.2)
Prothrombin Time: 21.2 seconds — ABNORMAL HIGH (ref 11.4–15.2)

## 2019-05-18 LAB — BODY FLUID CELL COUNT WITH DIFFERENTIAL
Eos, Fluid: 0 %
Eos, Fluid: 0 %
Lymphs, Fluid: 44 %
Lymphs, Fluid: 8 %
Monocyte-Macrophage-Serous Fluid: 9 % — ABNORMAL LOW (ref 50–90)
Monocyte-Macrophage-Serous Fluid: 13 % — ABNORMAL LOW (ref 50–90)
Neutrophil Count, Fluid: 47 % — ABNORMAL HIGH (ref 0–25)
Neutrophil Count, Fluid: 79 % — ABNORMAL HIGH (ref 0–25)
Total Nucleated Cell Count, Fluid: 835 cu mm (ref 0–1000)
Total Nucleated Cell Count, Fluid: 1015 cu mm — ABNORMAL HIGH (ref 0–1000)

## 2019-05-18 LAB — PROTEIN, PLEURAL OR PERITONEAL FLUID
Total protein, fluid: <3 g/dL
Total protein, fluid: <3 g/dL

## 2019-05-18 LAB — GLUCOSE, PLEURAL OR PERITONEAL FLUID: Glucose, Fluid: 86 mg/dL

## 2019-05-18 LAB — HEPARIN LEVEL (UNFRACTIONATED)
Heparin Unfractionated: 0.76 IU/mL — ABNORMAL HIGH (ref 0.30–0.70)
Heparin Unfractionated: 0.31 IU/mL (ref 0.30–0.70)

## 2019-05-18 LAB — PATHOLOGIST SMEAR REVIEW

## 2019-05-18 LAB — AMMONIA: Ammonia: 25 umol/L (ref 9–35)

## 2019-05-18 MED ORDER — ALBUMIN HUMAN 25 % IV SOLN
45.0000 g | Freq: Two times a day (BID) | INTRAVENOUS | Status: DC
  Filled 2019-05-18: qty 200

## 2019-05-18 MED ORDER — ALBUMIN HUMAN 25 % IV SOLN
25.0000 g | Freq: Four times a day (QID) | INTRAVENOUS | Status: AC
  Administered 2019-05-18 (×2): 25 g via INTRAVENOUS
  Filled 2019-05-18 (×2): qty 100

## 2019-05-18 MED ORDER — ALBUMIN HUMAN 25 % IV SOLN: 50.0000 g | Freq: Two times a day (BID) | INTRAVENOUS | Status: DC

## 2019-05-18 MED ORDER — HEPARIN (PORCINE) 25000 UT/250ML-% IV SOLN
1500.0000 [IU]/h | INTRAVENOUS | Status: DC
  Administered 2019-05-18: 16:00:00 700 [IU]/h via INTRAVENOUS
  Administered 2019-05-20: 800 [IU]/h via INTRAVENOUS
  Administered 2019-05-21: 1100 [IU]/h via INTRAVENOUS
  Administered 2019-05-22: 1350 [IU]/h via INTRAVENOUS
  Administered 2019-05-22 – 2019-05-23 (×2): 1500 [IU]/h via INTRAVENOUS
  Filled 2019-05-18 (×6): qty 250

## 2019-05-18 MED ORDER — ALBUMIN HUMAN 25 % IV SOLN
25.0000 g | Freq: Once | INTRAVENOUS | Status: AC
  Administered 2019-05-18: 25 g via INTRAVENOUS
  Filled 2019-05-18: qty 100

## 2019-05-18 MED ORDER — ALBUMIN HUMAN 25 % IV SOLN
50.0000 g | Freq: Two times a day (BID) | INTRAVENOUS | Status: DC
  Administered 2019-05-19 – 2019-05-20 (×3): 50 g via INTRAVENOUS
  Filled 2019-05-18 (×5): qty 200

## 2019-05-18 NOTE — Progress Notes (Signed)
PROGRESS NOTE    Richard Saunders  MLJ:[id number] DOB: 1940-05-22 DOA: 05/04/2019 PCP: Tonia Ghent, MD   Brief Narrative: 79 year old with past medical history significant for aortic insufficiency, A. fib on Eliquis, hypertension, LBBB, PVCs, nonalcoholic liver cirrhosis, history of ascites status post IR paracentesis last year, who presents complaining of generalized weakness.  Patient was evaluated in the ED on 1/13 for abdominal pain and emesis, labs were drawn but patient left before being seen by a provider.  Labs returned showing elevated lipase at 20 541 and he was advised to return to the ED.  Patient received his first Covid vaccine on 1/13.  Since he received the vaccine he has not been feeling well.  Evaluation in the ED patient was found to be tachycardic heart rate in the 120s, tachypneic with respiratory rate in the 20s, hypotensive with blood pressure 85/67.  White count 24, lactic acid 4.3, potassium 5.5, creatinine 2.6.  Transaminases.  Chest x-ray showed mild left bibasilar atelectasis or infiltrate with associated pleural effusion.  CT abdomen showed advanced liver cirrhosis with moderate amount of ascites.  Peripancreatic fat concerning with low-level pancreatitis. he received IV fluids, started on IV antibiotics.  Continues to be hypotensive, CCM has been consulted.  Patient was admitted with sepsis secondary to SBP and colitis.  He was also found to have decompensated liver failure and ascites.  He underwent paracentesis x2.  He will receive IV albumin.  He will require subsequent thoracentesis.  He presented also with acute pancreatitis.   Assessment & Plan:   Principal Problem:   Severe sepsis ([place]) Active Problems:   Atrial fibrillation (H[facility])   Hepatic cirrhosis (H[facility])   AKI (acute kidney injury) ([place])   Hyperkalemia   1-Sepsis, related to SBP and Colitis.   Hypotension; patient has received more than 2.5 L of IV fluids, remained hypotensive, lactic acid at  4.  Chest x-ray with infiltrates.  CT with possible colitis.   CCM consulted. Continue with flagyl. Change cefepime to ceftriaxone to better cover SBP C. difficile negative. BP improved. WBC  Follow white blood cell. Continue with ceftriaxone and Flagyl.  2-Acute pancreatitis;, continue with IV fluids.  Supportive care.  Pain management. Lipase normal, mild elevation of AST.  CT abdomen: No calcified gallstone. Start clear diet.  Continue with support care.  Advance diet   3-Pleural effusion. Complaints of dyspnea this am. Place on 2 L.  Follow ccm recommendation in regards thoracentesis.   4-AKI on CKD stage IIIa Prior creatinine 1.2 Present with a creatinine up to 2.6. Likely related to hypovolemia and sepsis and hypotension. Received IV fluids.  Cr trending down.   Ascites, Cirrhosis liver; decompensated Report worsening abdominal distension, and Dyspnea.  Has Paracentesis done 1-16. 2.6 L removed.  Albumin 30 g every 6 hours 4 doses first day,. Then will do 40 mg BID for 2 days.  Transaminases, LFT trending down, bilirubin still elevated.  Bilirubin trending down. Plan to repeat paracentesis today.  Will order IV albumin.  A fib; resume BB Holding Eliquis in anticipation of thoracentesis, paracentesis.  On Heparin Gtt.   Hyperkalemia; resolved.  Diarrhea, colitis;  Continue with fluid and IV Flagyl. Ceftriaxone.  C. difficile negative. Diarrhea improved.   Vision loss; transient. Discussed with neurology, plan to get MRI which was negative for acute stroke showed old micro hemorrhage. Ok to use anticoagulation per neurology.      Estimated body mass index is 25.42 kg/m as calculated from the following:   Height  as of this encounter: 6' 1"  (1.854 m).   Weight as of this encounter: 87.4 kg.   DVT prophylaxis: on heparin  Code Status: Full code Family Communication: care discussed with patient.  Disposition Plan: remain in the hospital;for management of  sepsis.  Decompensated liver failure requiring repeated paracentesis today Consultants:   CCM  Procedures:   none  Antimicrobials:  Cefepime Flagyl.   Subjective: She still complaining of abdominal pain, feels abdomen is still distended.  he started to feel confused. .  Ammonia level normal Objective: Vitals:   05/17/19 2003 05/17/19 2332 05/18/19 0530 05/18/19 0804  BP: 113/84 104/81 105/82 105/79  Pulse: (!) 122 (!) 119 (!) 114 (!) 113  Resp: 19 19 20 19   Temp: 98 F (36.7 C) 97.9 F (36.6 C) 98.1 F (36.7 C) (!) 97.4 F (36.3 C)  TempSrc: Oral Oral Oral Oral  SpO2: 93% 91% 92% 94%  Weight:      Height:        Intake/Output Summary (Last 24 hours) at 05/18/2019 0816 Last data filed at 05/18/2019 0530 Gross per 24 hour  Intake 120 ml  Output 750 ml  Net -630 ml   Filed Weights   05/26/2019 1336 05/17/2019 2143  Weight: 85.8 kg 87.4 kg    Examination:  General exam:NAD Respiratory system: Crackles bases.  Cardiovascular system: S 1, S 2 RRR Gastrointestinal system: BS present, soft, very distended, tender Central nervous system:  Alert, following command Extremities trace edema Skin: No rashes   Data Reviewed: I have personally reviewed following labs and imaging studies  CBC: Recent Labs  Lab 05/13/19 0002 05/15/2019 1334 05/16/19 0411 05/17/19 0750 05/18/19 0656  WBC 14.6* 24.8* 14.3* 11.8* 12.1*  NEUTROABS  --  21.9*  --   --   --   HGB 15.9 17.2* 13.6 14.4 12.8*  HCT 46.7 50.9 39.5 42.1 37.9*  MCV 104.9* 105.6* 105.3* 105.3* 105.9*  PLT 218 190 148* 159 962*   Basic Metabolic Panel: Recent Labs  Lab 05/15/2019 1334 05/15/19 0030 05/16/19 0411 05/17/19 0750 05/18/19 0656  NA 137 138 138 137 141  K 5.5* 4.7 4.7 4.9 4.7  CL 101 109 109 109 108  CO2 23 18* 18* 19* 21*  GLUCOSE 104* 110* 79 91 83  BUN 58* 60* 69* 63* 66*  CREATININE 2.69* 2.19* 1.76* 1.56* 1.43*  CALCIUM 7.3* 6.4* 7.1* 8.0* 8.6*   GFR: Estimated Creatinine Clearance:  48.1 mL/min (A) (by C-G formula based on SCr of 1.43 mg/dL (H)). Liver Function Tests: Recent Labs  Lab 05/23/2019 1334 05/15/19 0030 05/16/19 0411 05/17/19 0750 05/18/19 0656  AST 52* 36 48* 50* 44*  ALT 24 19 27 30 25   ALKPHOS 48 38 43 51 41  BILITOT 3.4* 2.8* 2.9* 2.3* 1.9*  PROT 6.3* 4.9* 5.4* 5.8* 6.0*  ALBUMIN 3.0* 2.5* 2.8*  2.7* 2.8* 3.8   Recent Labs  Lab 05/13/19 0002 05/11/2019 1334  LIPASE 2,501* 101*   No results for input(s): AMMONIA in the last 168 hours. Coagulation Profile: Recent Labs  Lab 05/10/2019 1410  INR 2.8*   Cardiac Enzymes: No results for input(s): CKTOTAL, CKMB, CKMBINDEX, TROPONINI in the last 168 hours. BNP (last 3 results) Recent Labs    06/05/18 1356 06/11/18 1644  PROBNP 116.0* 81.0   HbA1C: No results for input(s): HGBA1C in the last 72 hours. CBG: No results for input(s): GLUCAP in the last 168 hours. Lipid Profile: No results for input(s): CHOL, HDL, LDLCALC, TRIG, CHOLHDL, LDLDIRECT  in the last 72 hours. Thyroid Function Tests: No results for input(s): TSH, T4TOTAL, FREET4, T3FREE, THYROIDAB in the last 72 hours. Anemia Panel: No results for input(s): VITAMINB12, FOLATE, FERRITIN, TIBC, IRON, RETICCTPCT in the last 72 hours. Sepsis Labs: Recent Labs  Lab 05/25/2019 1735 05/15/19 0030 05/15/19 0955 05/15/19 1321  LATICACIDVEN 3.8* 4.0* 3.2* 2.5*    Recent Results (from the past 240 hour(s))  Blood Culture (routine x 2)     Status: None (Preliminary result)   Collection Time: 05/30/2019  2:10 PM   Specimen: BLOOD  Result Value Ref Range Status   Specimen Description BLOOD RIGHT ANTECUBITAL  Final   Special Requests   Final    BOTTLES DRAWN AEROBIC ONLY Blood Culture results may not be optimal due to an inadequate volume of blood received in culture bottles   Culture   Final    NO GROWTH 4 DAYS Performed at [facility] [address] [place], [place] [address]    Report Status PENDING  Incomplete  Blood Culture  (routine x 2)     Status: None (Preliminary result)   Collection Time: 05/30/2019  2:10 PM   Specimen: BLOOD  Result Value Ref Range Status   Specimen Description BLOOD LEFT ANTECUBITAL  Final   Special Requests   Final    BOTTLES DRAWN AEROBIC AND ANAEROBIC Blood Culture adequate volume   Culture   Final    NO GROWTH 4 DAYS Performed at [facility], [place] [address], [place], [place] [address]    Report Status PENDING  Incomplete  Respiratory Panel by RT PCR (Flu A&B, Covid) - Nasopharyngeal Swab     Status: None   Collection Time: 05/22/2019  2:20 PM   Specimen: Nasopharyngeal Swab  Result Value Ref Range Status   SARS Coronavirus 2 by RT PCR NEGATIVE NEGATIVE Final    Comment: (NOTE) SARS-CoV-2 target nucleic acids are NOT DETECTED. The SARS-CoV-2 RNA is generally detectable in upper respiratoy specimens during the acute phase of infection. The lowest concentration of SARS-CoV-2 viral copies this assay can detect is 131 copies/mL. A negative result does not preclude SARS-Cov-2 infection and should not be used as the sole basis for treatment or other patient management decisions. A negative result may occur with  improper specimen collection/handling, submission of specimen other than nasopharyngeal swab, presence of viral mutation(s) within the areas targeted by this assay, and inadequate number of viral copies (<131 copies/mL). A negative result must be combined with clinical observations, patient history, and epidemiological information. The expected result is Negative. Fact Sheet for Patients:  PinkCheek.be Fact Sheet for Healthcare Providers:  GravelBags.it This test is not yet ap proved or cleared by the [place] FDA and  has been authorized for detection and/or diagnosis of SARS-CoV-2 by FDA under an Emergency Use Authorization (EUA). This EUA will remain  in effect (meaning this test can be used) for the  duration of the COVID-19 declaration under Section 564(b)(1) of the Act, 21 U.S.C. section 360bbb-3(b)(1), unless the authorization is terminated or revoked sooner.    Influenza A by PCR NEGATIVE NEGATIVE Final   Influenza B by PCR NEGATIVE NEGATIVE Final    Comment: (NOTE) The Xpert Xpress SARS-CoV-2/FLU/RSV assay is intended as an aid in  the diagnosis of influenza from Nasopharyngeal swab specimens and  should not be used as a sole basis for treatment. Nasal washings and  aspirates are unacceptable for Xpert Xpress SARS-CoV-2/FLU/RSV  testing. Fact Sheet for Patients: PinkCheek.be Fact  Sheet for Healthcare Providers: GravelBags.it This test is not yet approved or cleared by the [place] and  has been authorized for detection and/or diagnosis of SARS-CoV-2 by  FDA under an Emergency Use Authorization (EUA). This EUA will remain  in effect (meaning this test can be used) for the duration of the  Covid-19 declaration under Section 564(b)(1) of the Act, 21  U.S.C. section 360bbb-3(b)(1), unless the authorization is  terminated or revoked. Performed at [facility], [place] [address] [place], [place] [address]   C difficile quick scan w PCR reflex     Status: None   Collection Time: 05/15/19  1:35 AM   Specimen: STOOL  Result Value Ref Range Status   C Diff antigen NEGATIVE NEGATIVE Final   C Diff toxin NEGATIVE NEGATIVE Final   C Diff interpretation No C. difficile detected.  Final    Comment: Performed at [facility], [place] [address] [place], [place] [address]  GI pathogen panel by PCR, stool     Status: None   Collection Time: 05/15/19  1:35 AM   Specimen: Stool  Result Value Ref Range Status   Plesiomonas shigelloides NOT DETECTED NOT DETECTED Final   Yersinia enterocolitica NOT DETECTED NOT DETECTED Final   Vibrio NOT DETECTED NOT DETECTED Final   Enteropathogenic E coli NOT DETECTED NOT DETECTED  Final   E coli (ETEC) LT/ST NOT DETECTED NOT DETECTED Final   E coli 1761 by PCR Not applicable NOT DETECTED Final   Cryptosporidium by PCR NOT DETECTED NOT DETECTED Final   Entamoeba histolytica NOT DETECTED NOT DETECTED Final   Adenovirus F 40/41 NOT DETECTED NOT DETECTED Final   Norovirus GI/GII NOT DETECTED NOT DETECTED Final   Sapovirus NOT DETECTED NOT DETECTED Final    Comment: (NOTE) Performed At: [facility] [place], [place] [id number] Rush Farmer MD IR:[id number]    Vibrio cholerae NOT DETECTED NOT DETECTED Final   Campylobacter by PCR NOT DETECTED NOT DETECTED Final   Salmonella by PCR NOT DETECTED NOT DETECTED Final   E coli (STEC) NOT DETECTED NOT DETECTED Final   Enteroaggregative E coli NOT DETECTED NOT DETECTED Final   Shigella by PCR NOT DETECTED NOT DETECTED Final   Cyclospora cayetanensis NOT DETECTED NOT DETECTED Final   Astrovirus NOT DETECTED NOT DETECTED Final   G lamblia by PCR NOT DETECTED NOT DETECTED Final   Rotavirus A by PCR NOT DETECTED NOT DETECTED Final  Urine culture     Status: None   Collection Time: 05/15/19  9:10 AM   Specimen: In/Out Cath Urine  Result Value Ref Range Status   Specimen Description IN/OUT CATH URINE  Final   Special Requests NONE  Final   Culture   Final    NO GROWTH Performed at [facility], [address], [place], [place] [address]    Report Status 05/16/2019 FINAL  Final  Body fluid culture     Status: None (Preliminary result)   Collection Time: 05/16/19  3:12 PM   Specimen: Peritoneal Washings; Body Fluid  Result Value Ref Range Status   Specimen Description PERITONEAL  Final   Special Requests Normal  Final   Gram Stain   Final    FEW WBC PRESENT,BOTH PMN AND MONONUCLEAR NO ORGANISMS SEEN    Culture   Final    NO GROWTH < 24 HOURS Performed at [facility], [place] [address] [place], [place] [address]    Report  Status PENDING  Incomplete         Radiology  Studies: MR BRAIN WO CONTRAST  Result Date: 05/17/2019 CLINICAL DATA:  Vision loss. EXAM: MRI HEAD WITHOUT CONTRAST TECHNIQUE: Multiplanar, multiecho pulse sequences of the brain and surrounding structures were obtained without intravenous contrast. COMPARISON:  11/25/2017 FINDINGS: Brain: A new small focus of susceptibility artifact posteriorly in the left temporal lobe is compatible with a chronic microhemorrhage. No acute infarct, mass, midline shift, or extra-axial fluid collection is identified. Cerebral white matter T2 hyperintensities are nonspecific but compatible with minimal chronic small vessel ischemic disease, not considered abnormal for age. There is mild cerebral and cerebellar atrophy Vascular: Major intracranial vascular flow voids are preserved. Skull and upper cervical spine: Unremarkable bone marrow signal. Sinuses/Orbits: Bilateral cataract extraction. Small right maxillary sinus mucous retention cyst. Clear mastoid air cells. Other: None. IMPRESSION: 1. No acute intracranial abnormality. 2. Single focus of chronic microhemorrhage in the left temporal lobe. Electronically Signed   By: Logan Bores M.D.   On: 05/17/2019 10:28   DG CHEST PORT 1 VIEW  Result Date: 05/17/2019 CLINICAL DATA:  Shortness of breath. Pleural effusion. EXAM: PORTABLE CHEST 1 VIEW COMPARISON:  May 16, 2019 FINDINGS: No pneumothorax. The right lung is clear. The left-sided pleural effusion with underlying opacity is stable. The cardiomediastinal silhouette is stable. IMPRESSION: Stable left-sided pleural effusion with underlying opacity. Electronically Signed   By: Dorise Bullion III M.D   On: 05/17/2019 08:20   DG CHEST PORT 1 VIEW  Result Date: 05/16/2019 CLINICAL DATA:  Shortness of breath, abdominal pain and vomiting. History of cirrhosis. EXAM: PORTABLE CHEST 1 VIEW COMPARISON:  Chest x-ray 05/22/2019 FINDINGS: The heart is upper limits of normal in size and stable. There is moderate tortuosity and  ectasia of the thoracic aorta. Persistent and slightly enlarging left pleural effusion with overlying atelectasis. Mild central vascular congestion without overt pulmonary edema. The bony thorax is intact. IMPRESSION: Persistent and slightly enlarging left pleural effusion with overlying atelectasis. Electronically Signed   By: Marijo Sanes M.D.   On: 05/16/2019 09:09        Scheduled Meds: . calcium citrate  200 mg of elemental calcium Oral BID  . metoprolol succinate  12.5 mg Oral BID   Continuous Infusions: . albumin human 30 g (05/18/19 0314)  . cefTRIAXone (ROCEPHIN)  IV 2 g (05/17/19 1221)  . heparin 850 Units/hr (05/17/19 2243)  . metronidazole 500 mg (05/18/19 0543)     LOS: 4 days    Time spent: 35 minutes.     Elmarie Shiley, MD [facility]   If 7PM-7AM, please contact night-coverage [email or web address] Password [email or web address] 05/18/2019, 8:16 AM

## 2019-05-18 NOTE — Progress Notes (Signed)
NAME:  Richard Saunders, MRN:  [id number], DOB:  Sep 16, 1940, LOS: 4 ADMISSION DATE:  05/09/2019, CONSULTATION DATE:  05/15/19 REFERRING MD: Richard Leff, MD  CHIEF COMPLAINT:  Hypotension  Brief History   79 year old male with cirrhosis with SBP- presented with N/V/D and admitted for sepsis secondary to GI etiology. PCCM consulted for hypotension. Following for chronic left pleural effusion.  History of present illness   [name] is a 79 year old male with the below medical history who presented to the ED for abdominal pain, nausea, vomiting and weakness on 1/13-1/14. He left AMA however his lab results after he left demonstrated elevated lipase 2501. He returned to the ED with similar symptoms as well as new onset diarrhea. Prior to admission he has had poor PO intake, decreased UOP and tachycardia in the last week. His toprolol was increased by his Cardiologist for tachycardia. Of note, he did receive COVID vaccination on 1/13 but states symptoms started prior to this.   In the ED he was hypotensive with BP 85/67. Labs significant for WBC 24.8, K 5.5, BUN/Cr 58/2.6 and LA 4.3. CT A/P with cirrhosis and ascites, pancreatitis and bilateral pleural effusion. ED [place] with no pocket seen for ascites drainage. Given IVF and broad spectrum antibiotics with improvement in hypotension.  1/16, patient with persistent hypotension. Has received total 5.5L overnight and on mIVF 125/hr.   Past Medical History  Atrial fibrillation on Eliquis, hx of PVS, aortic insufficiency, HTN, non-alcoholic cirrhosis, hx of ascites, anxiety/depression  Significant Hospital Events   1/13 Presented to ED with N/V/abdominal pain. Left AMA 1/15 Returned to ED  Consults:  [facility] PCCM  Procedures:  1/17 para 1/19 para 1/19 thora  Significant Diagnostic Tests:  CT A/P Advanced cirrhosis of the liver, ascites, pancreatitis, bilateral pleural effusions  Ascitic fluid 1/17: 1,365 WBCs (80% PMNs)   Micro  Data:  BCX 1/15 >> C.diff 1/16 negative Ascitic fluid 1/17- few WBC >> Urine culture 1/16 >>NG  Antimicrobials:  Vanc 1/15> Cefepime 1/15> 1/17 Flagyl 1/15 > Ceftriaxone 1/18>>  Interim history/subjective:  Still not feeling great. Still SOB and weak. Felt a little better after procedures today.  Objective   Blood pressure 103/63, pulse (!) 114, temperature 97.9 F (36.6 C), temperature source Tympanic, resp. rate 18, height 6' 1"  (1.854 m), weight 87.4 kg, SpO2 95 %.        Intake/Output Summary (Last 24 hours) at 05/18/2019 1713 Last data filed at 05/18/2019 1542 Gross per 24 hour  Intake 240 ml  Output 1000 ml  Net -760 ml   Filed Weights   05/21/2019 1336 05/03/2019 2143  Weight: 85.8 kg 87.4 kg    Physical Exam: General: Elderly man lying comfortably in bed no acute distress HENT: [place]/AT, eyes anicteric Respiratory: Tachypnea, but improved compared to previous exams.  Breathing comfortably on 2 L nasal cannula.  Tachypnea improved post thoracentesis. Cardiovascular: Tachycardic, regular rhythm, no significant peripheral edema. GI: Soft, minimally distended-improved after para Extremities: No peripheral edema, clubbing or cyanosis Neuro: Alert, answering questions appropriately.  Moving all extremities spontaneously.  Globally weak, but able to sit up in bed with assistance. Skin: Pallor, no bruising or rashes Psych: Cooperative with exam, conversational  CXR- personally reviewed.  Reduced volume left pleural fluid.  Persistently elevated left hemidiaphragm with mild opacification of the left lower lobe.   Resolved Hospital Problem list     Assessment & Plan:   Acute hypoxic respiratory failure- likely due to abdominal distention from  ascites and complicated by chronic left pleural effusion.  Acute pulmonary edema from volume resuscitation is also likely. -Titrate down oxygen as able; maintain saturations greater than 88%. -Parents are at bedside today-diagnostic  studies pending -Given supplemental albumin for day 3 of SBP treatment.  Given less than 4 L of fluid was taken out today, no additional albumin was required. -DuoNebs as needed for wheezing -Recommend additional IV fluid resuscitation be limited; if today he has hypotension that appears hypovolemic post procedures, would prefer to resuscitated least partially with albumin.   Severe sepsis secondary to SBP Diarrhea C.diff negative -Continue abx: Cefepime & Flagyl  Afib with RVR --con't heparin for [facility] -may need to increase Bblocker for rate control; need to monitor BP  Decompensated Cirrhosis with ascites, SBP. Hyperbilirubinemia improving. -Pathology from ascitic fluid pending  AKI, likely prerenal-continuing to improve. Baseline Cr 1.2. -Continue to monitor levels & UOP  We will continue to follow.  Best practice:  Diet: Per primary Pain/Anxiety/Delirium protocol (if indicated): -- VAP protocol (if indicated): -- DVT prophylaxis: SCDs GI prophylaxis: -- Glucose control: Per primary Mobility: As tolerated Code Status: Full Disposition:    Labs   CBC: Recent Labs  Lab 05/13/19 0002 05/11/2019 1334 05/16/19 0411 05/17/19 0750 05/18/19 0656  WBC 14.6* 24.8* 14.3* 11.8* 12.1*  NEUTROABS  --  21.9*  --   --   --   HGB 15.9 17.2* 13.6 14.4 12.8*  HCT 46.7 50.9 39.5 42.1 37.9*  MCV 104.9* 105.6* 105.3* 105.3* 105.9*  PLT 218 190 148* 159 131*    Basic Metabolic Panel: Recent Labs  Lab 05/15/2019 1334 05/15/19 0030 05/16/19 0411 05/17/19 0750 05/18/19 0656  NA 137 138 138 137 141  K 5.5* 4.7 4.7 4.9 4.7  CL 101 109 109 109 108  CO2 23 18* 18* 19* 21*  GLUCOSE 104* 110* 79 91 83  BUN 58* 60* 69* 63* 66*  CREATININE 2.69* 2.19* 1.76* 1.56* 1.43*  CALCIUM 7.3* 6.4* 7.1* 8.0* 8.6*   GFR: Estimated Creatinine Clearance: 48.1 mL/min (A) (by C-G formula based on SCr of 1.43 mg/dL (H)). Recent Labs  Lab 05/18/2019 1334 05/13/2019 1349 05/10/2019 1735 05/15/19 0030  05/15/19 0955 05/15/19 1321 05/16/19 0411 05/17/19 0750 05/18/19 0656  WBC 24.8*  --   --   --   --   --  14.3* 11.8* 12.1*  LATICACIDVEN  --    < > 3.8* 4.0* 3.2* 2.5*  --   --   --    < > = values in this interval not displayed.    Liver Function Tests: Recent Labs  Lab 05/26/2019 1334 05/15/19 0030 05/16/19 0411 05/17/19 0750 05/18/19 0656  AST 52* 36 48* 50* 44*  ALT 24 19 27 30 25   ALKPHOS 48 38 43 51 41  BILITOT 3.4* 2.8* 2.9* 2.3* 1.9*  PROT 6.3* 4.9* 5.4* 5.8* 6.0*  ALBUMIN 3.0* 2.5* 2.8*  2.7* 2.8* 3.8   Recent Labs  Lab 05/13/19 0002 05/29/2019 1334  LIPASE 2,501* 101*   Recent Labs  Lab 05/18/19 0943  AMMONIA 25    ABG    Component Value Date/Time   TCO2 24 07/25/2011 1411     Coagulation Profile: Recent Labs  Lab 05/26/2019 1410 05/18/19 0656  INR 2.8* 1.9*    Cardiac Enzymes: No results for input(s): CKTOTAL, CKMB, CKMBINDEX, TROPONINI in the last 168 hours.  HbA1C: Hgb A1c MFr Bld  Date/Time Value Ref Range Status  07/29/2016 09:14 AM 5.2 4.6 - 6.5 %  Final    Comment:    Glycemic Control Guidelines for People with Diabetes:Non Diabetic:  <6%Goal of Therapy: <7%Additional Action Suggested:  >8%   07/22/2012 09:41 AM 5.5 4.6 - 6.5 % Final    Comment:    Glycemic Control Guidelines for People with Diabetes:Non Diabetic:  <6%Goal of Therapy: <7%Additional Action Suggested:  >8%       Julian Hy, DO 05/18/19 5:24 PM [facility]

## 2019-05-18 NOTE — Progress Notes (Addendum)
Rapid Response Procedural Assistance Progress Note  Start time: 1130 End time: 8546  Dr. Carlis Abbott and Gladstone Pih, NP at bedside to perform a paracentesis and left thoracentesis on [name]. Consent has been signed and is at bedside. Provider privileges have been verified. Time out performed at 1150 AM. Pt on continuous telemetry and pulse oximetry monitoring during procedure. BP intermittently checked during procedure. Pt had no complaints during or following procedure. Vital signs remained stable following procedures. Order placed for CXR following thoracentesis. Pleural and peritoneal fluid to be sent to lab.

## 2019-05-18 NOTE — Plan of Care (Signed)
Poc progressing.  

## 2019-05-18 NOTE — Progress Notes (Addendum)
[place] for heparin Indication: atrial fibrillation  Allergies  Allergen Reactions  . Colchicine Diarrhea  . Fluticasone Propionate Other (See Comments)    He didn't tolerate the taste/smell prev  . Nasacort [Triamcinolone] Other (See Comments)    Didn't tolerate    Patient Measurements: Height: 6' 1"  (185.4 cm) Weight: 192 lb 11.2 oz (87.4 kg) IBW/kg (Calculated) : 79.9 Heparin Dosing Weight: 87kg  Vital Signs: Temp: 97.4 F (36.3 C) (01/19 0804) Temp Source: Oral (01/19 0804) BP: 105/79 (01/19 0804) Pulse Rate: 113 (01/19 0804)  Labs: Recent Labs    05/15/19 0955 05/15/19 1321 05/16/19 0411 05/16/19 0411 05/17/19 0750 05/17/19 2009 05/18/19 0656  HGB  --   --  13.6   < > 14.4  --  12.8*  HCT  --   --  39.5  --  42.1  --  37.9*  PLT  --   --  148*  --  159  --  131*  APTT  --   --   --   --   --  >200* 144*  LABPROT  --   --   --   --   --   --  21.2*  INR  --   --   --   --   --   --  1.9*  HEPARINUNFRC  --   --   --   --   --  1.04* 0.76*  CREATININE  --   --  1.76*  --  1.56*  --  1.43*  TROPONINIHS 50* 41*  --   --   --   --   --    < > = values in this interval not displayed.    Estimated Creatinine Clearance: 48.1 mL/min (A) (by C-G formula based on SCr of 1.43 mg/dL (H)).   Medical History: Past Medical History:  Diagnosis Date  . Allergy   . Anxiety   . Aortic insufficiency    a. 04/2018 Echo: EF 55-60%. Mild to Mod MR. Mild AI/TR.  Marland Kitchen Atrial fibrillation ([place])    a. Dx 04/2018. CHA2DS2VASc = 4-->Eliquis.  . Cirrhosis ([place])   . Depression   . H/O: rheumatic fever 2012   (per patient) - eval by [facility] LBBB with PVC's  . History of chicken pox   . Hypertension   . LBBB (left bundle branch block)   . Organic impotence   . PVC's (premature ventricular contractions)    Assessment: 79 year old male undergoing medical workup and may need thoracentesis but decision is pending. Patient with history of  afib and has been off his apixaban. Discussed with primary team today, given the possible need of invasive procedure, will hold off on starting apixaban and start IV heparin in the interim which can easily be turned off/on as needed.   Heparin level and aPTT both elevated this am but trending down on lower dose. No S/Sx bleeding, appears labs were drawn appropriately.   Orders to hold heparin today for thoracentesis and follow up restart with CCM  Goal of Therapy:  Heparin level 0.3-0.7 units/ml Monitor platelets by anticoagulation protocol: Yes   Plan:  Hold heparin for procedure Follow up restart as appropriate  Erin Hearing PharmD., BCPS Clinical Pharmacist 05/18/2019 8:50 AM   S/p paracentesis and thoracentesis this afternoon. Verified with CCM ok to resume anticoagulation now.   Will restart at lower rate d/t elevated coags this morning.  05/18/2019 3:25 PM

## 2019-05-18 NOTE — Procedures (Signed)
Thoracentesis Procedure Note  Pre-operative Diagnosis: Chronic Left pleural effusion  Post-operative Diagnosis: Smaller Chronic Left Pleural Effusion  Indications: Dyspnea, diagnostic fluid analysis  Procedure Details  Consent: Informed consent was obtained. Risks of the procedure were discussed including: infection, bleeding, pain, pneumothorax.  Under sterile conditions the patient was positioned. Betadine solution and sterile drapes were utilized.  2% plain plain was used to anesthetize the 8th  rib space. Fluid was obtained without any difficulties and minimal blood loss.  A dressing was applied to the wound and wound care instructions were provided.   Findings 500 ml of clear pleural fluid was obtained. A sample was sent to Pathology for cytogenetics, flow, and cell counts, as well as for infection analysis.  Complications:  None; patient tolerated the procedure well.          Condition: stable  Plan A follow up chest x-ray was ordered.>> No pneumothorax Bed Rest for 2 hours. Tylenol 650 mg. for pain.  Attending Attestation:  Fluid pocket was identified with [place] technology once patient was positioned for the procedure. The skin was marked with use of live time [place].  Dr. Carlis Abbott was scrubbed in and present for the entire procedure. CXR was obtained post procedure , no pneumothorax. Pt. Tolerated procedure well.  Magdalen Spatz, MSN, AGACNP-BC [place] Pager # [phone] After 4 pm please call [phone] 05/18/2019 2:03 PM

## 2019-05-18 NOTE — Care Management Important Message (Signed)
Important Message  Patient Details  Name: Richard Saunders MRN: [id number] Date of Birth: 03-17-1941   Medicare Important Message Given:  Yes     Shelda Altes 05/18/2019, 12:49 PM

## 2019-05-18 NOTE — Procedures (Signed)
Paracentesis Procedure Note  Indications: Ascites  Procedure Details  Consent: Informed consent was obtained. Risks of the procedure were discussed including: infection, bleeding, pain, bowel perforation.  Maximum sterile technique was used including antiseptics, cap, gloves, gown, hand hygiene, mask and sheet. Skin prep: Chlorhexidine; local anesthetic administered. The abdominal wall was punctured in the right upper quadrant . Fluid was obtained without any difficulties and minimal blood loss.  A dressing was applied to the wound and wound care instructions were provided.   Findings 2500 ml of clear ascitic fluid was obtained. A sample was sent to Pathology for cytology and cell counts, as well as for infection analysis.  Complications:  None; patient tolerated the procedure well.        Condition: stable  Procedure was done with use of live time [place] to identify a pocket of fluid. The skin was marked. Dr. Carlis Abbott was scrubbed in and present for the entire procedure.Peritoneal fluid was sent for labs. Pt. Tolerated the procedure well.   Magdalen Spatz, MSN, AGACNP-BC [place] Pager # [phone] After 4 pm please call [phone] 05/18/2019 2:10 PM

## 2019-05-19 ENCOUNTER — Encounter (HOSPITAL_COMMUNITY): Payer: Self-pay | Admitting: Internal Medicine

## 2019-05-19 ENCOUNTER — Inpatient Hospital Stay (HOSPITAL_COMMUNITY): Payer: Medicare HMO

## 2019-05-19 DIAGNOSIS — I4891 Unspecified atrial fibrillation: Secondary | ICD-10-CM

## 2019-05-19 DIAGNOSIS — J918 Pleural effusion in other conditions classified elsewhere: Secondary | ICD-10-CM

## 2019-05-19 DIAGNOSIS — K769 Liver disease, unspecified: Secondary | ICD-10-CM

## 2019-05-19 DIAGNOSIS — R0600 Dyspnea, unspecified: Secondary | ICD-10-CM

## 2019-05-19 LAB — COMPREHENSIVE METABOLIC PANEL
ALT: 20 U/L (ref 0–44)
AST: 28 U/L (ref 15–41)
Albumin: 4.3 g/dL (ref 3.5–5.0)
Alkaline Phosphatase: 38 U/L (ref 38–126)
Anion gap: 13 (ref 5–15)
BUN: 68 mg/dL — ABNORMAL HIGH (ref 8–23)
CO2: 20 mmol/L — ABNORMAL LOW (ref 22–32)
Calcium: 9.3 mg/dL (ref 8.9–10.3)
Chloride: 103 mmol/L (ref 98–111)
Creatinine, Ser: 1.53 mg/dL — ABNORMAL HIGH (ref 0.61–1.24)
GFR calc Af Amer: 50 mL/min — ABNORMAL LOW (ref >60)
GFR calc non Af Amer: 43 mL/min — ABNORMAL LOW (ref >60)
Glucose, Bld: 84 mg/dL (ref 70–99)
Potassium: 4.6 mmol/L (ref 3.5–5.1)
Sodium: 136 mmol/L (ref 135–145)
Total Bilirubin: 1.8 mg/dL — ABNORMAL HIGH (ref 0.3–1.2)
Total Protein: 6.2 g/dL — ABNORMAL LOW (ref 6.5–8.1)

## 2019-05-19 LAB — CULTURE, BLOOD (ROUTINE X 2)
Culture: NO GROWTH
Culture: NO GROWTH
Special Requests: ADEQUATE

## 2019-05-19 LAB — CBC
HCT: 38.5 % — ABNORMAL LOW (ref 39.0–52.0)
Hemoglobin: 12.8 g/dL — ABNORMAL LOW (ref 13.0–17.0)
MCH: 35.9 pg — ABNORMAL HIGH (ref 26.0–34.0)
MCHC: 33.2 g/dL (ref 30.0–36.0)
MCV: 107.8 fL — ABNORMAL HIGH (ref 80.0–100.0)
Platelets: 126 10*3/uL — ABNORMAL LOW (ref 150–400)
RBC: 3.57 MIL/uL — ABNORMAL LOW (ref 4.22–5.81)
RDW: 13.5 % (ref 11.5–15.5)
WBC: 10.6 10*3/uL — ABNORMAL HIGH (ref 4.0–10.5)
nRBC: 0 % (ref 0.0–0.2)

## 2019-05-19 LAB — GLUCOSE, PLEURAL OR PERITONEAL FLUID: Glucose, Fluid: 81 mg/dL

## 2019-05-19 LAB — APTT: aPTT: 85 seconds — ABNORMAL HIGH (ref 24–36)

## 2019-05-19 LAB — PATHOLOGIST SMEAR REVIEW

## 2019-05-19 LAB — BODY FLUID CULTURE
Culture: NO GROWTH
Special Requests: NORMAL

## 2019-05-19 LAB — HEPARIN LEVEL (UNFRACTIONATED): Heparin Unfractionated: 0.27 IU/mL — ABNORMAL LOW (ref 0.30–0.70)

## 2019-05-19 MED ORDER — IPRATROPIUM-ALBUTEROL 0.5-2.5 (3) MG/3ML IN SOLN
3.0000 mL | Freq: Three times a day (TID) | RESPIRATORY_TRACT | Status: DC
  Administered 2019-05-20 – 2019-05-23 (×8): 3 mL via RESPIRATORY_TRACT
  Filled 2019-05-19 (×8): qty 3

## 2019-05-19 MED ORDER — SODIUM BICARBONATE 650 MG PO TABS
650.0000 mg | ORAL_TABLET | Freq: Two times a day (BID) | ORAL | Status: DC
  Administered 2019-05-19 – 2019-05-24 (×10): 650 mg via ORAL
  Filled 2019-05-19 (×10): qty 1

## 2019-05-19 MED ORDER — IPRATROPIUM-ALBUTEROL 0.5-2.5 (3) MG/3ML IN SOLN
3.0000 mL | Freq: Four times a day (QID) | RESPIRATORY_TRACT | Status: DC
  Administered 2019-05-19 (×2): 3 mL via RESPIRATORY_TRACT
  Filled 2019-05-19 (×2): qty 3

## 2019-05-19 MED ORDER — ALBUTEROL SULFATE (2.5 MG/3ML) 0.083% IN NEBU
2.5000 mg | INHALATION_SOLUTION | Freq: Four times a day (QID) | RESPIRATORY_TRACT | Status: DC | PRN
  Administered 2019-05-20 (×2): 2.5 mg via RESPIRATORY_TRACT
  Filled 2019-05-19 (×2): qty 3

## 2019-05-19 NOTE — Progress Notes (Signed)
Rehab Admissions Coordinator Note:  Per PT recommendation, this patient was screened by Raechel Ache for appropriateness for an Inpatient Acute Rehab Consult.  At this time, we will follow for an additional therapy session to see if pt can tolerate ambulation. Given that he is Min G/Min A for transfers on evaluation, anticipate he will progress quickly and may not need IP rehab. Will follow.   Raechel Ache 05/19/2019, 2:20 PM  I can be reached at [phone].

## 2019-05-19 NOTE — Evaluation (Signed)
Physical Therapy Evaluation Patient Details Name: Richard Saunders MRN: [id number] DOB: [birth year] Today's Date: 05/19/2019   History of Present Illness  79yo male c/o generalized weakness and malaise. Had first covid vaccine on 05/12/19, has not felt well since. Hypotensive in the ED, covid negative. Admitted for severe sepsis of unknown etiology, acutely decompensated liver cirrhosis. PMH anxiety, aortic insufficiency, A-fib, HTN, LBBB  Clinical Impression   Patient received in bed, very fatigued but pleasant and cooperative with PT today. Able to perform bed mobility with MinA, functional transfers with Min guard-MinA; deferred gait assessment due to severe fatigue as well as HR sustaining at 119-123BPM during session. Had soft BP throughout session with slight drop with positional changes and symptomatic presentation. He was left up in the chair positioned to comfort with all needs met, RN/NT aware of mobility status. Given his prior level of independence, will ask CIR to assess and see if he meets criteria for admission- if not he will likely need SNF.     Follow Up Recommendations CIR;Supervision/Assistance - 24 hour    Equipment Recommendations  Rolling walker with 5" wheels;3in1 (PT)    Recommendations for Other Services       Precautions / Restrictions Precautions Precautions: Fall;Other (comment) Precaution Comments: watch vitals, easily fatigued Restrictions Weight Bearing Restrictions: No      Mobility  Bed Mobility Overal bed mobility: Needs Assistance Bed Mobility: Supine to Sit     Supine to sit: Min assist     General bed mobility comments: MinA to fully scoot to EOB, once at EOB S to maintain midline  Transfers Overall transfer level: Needs assistance Equipment used: Rolling walker (2 wheeled) Transfers: Sit to/from [facility] Sit to Stand: Min assist Stand pivot transfers: Min guard       General transfer comment: MinA to boost to  full upright position with RW, cues for hand placement and sequencing; once up able to pivot to chair with min guard and assist for lines  Ambulation/Gait             General Gait Details: deferred- elevated HR and fatigue  Stairs            Wheelchair Mobility    Modified Rankin (Stroke Patients Only)       Balance Overall balance assessment: Needs assistance Sitting-balance support: Bilateral upper extremity supported;Feet supported Sitting balance-Leahy Scale: Good     Standing balance support: Bilateral upper extremity supported;During functional activity Standing balance-Leahy Scale: Fair Standing balance comment: reliant on BUE support                             Pertinent Vitals/Pain Pain Assessment: Faces Faces Pain Scale: Hurts a little bit Pain Location: generalized discomfort with mobility Pain Descriptors / Indicators: Aching;Sore Pain Intervention(s): Limited activity within patient's tolerance;Monitored during session;Repositioned    Home Living Family/patient expects to be discharged to:: Private residence Living Arrangements: Alone Available Help at Discharge: Family;Available PRN/intermittently Type of Home: House Home Access: Stairs to enter Entrance Stairs-Rails: Right Entrance Stairs-Number of Steps: 3 Home Layout: One level Home Equipment: None      Prior Function Level of Independence: Independent         Comments: Drives     Hand Dominance        Extremity/Trunk Assessment   Upper Extremity Assessment Upper Extremity Assessment: Defer to OT evaluation    Lower Extremity Assessment Lower Extremity Assessment: Generalized weakness  Cervical / Trunk Assessment Cervical / Trunk Assessment: Kyphotic  Communication   Communication: HOH  Cognition Arousal/Alertness: Awake/alert Behavior During Therapy: Flat affect Overall Cognitive Status: Within Functional Limits for tasks assessed                                  General Comments: very polite and participatory, just very fatigued. Good command and cue following, motivated to mobilize      General Comments General comments (skin integrity, edema, etc.): HR at 119-123BPM during session on 3LPM O2; sats 95% and up. RN aware.    Exercises     Assessment/Plan    PT Assessment Patient needs continued PT services  PT Problem List Decreased strength;Decreased knowledge of use of DME;Decreased activity tolerance;Decreased safety awareness;Decreased balance;Decreased mobility;Cardiopulmonary status limiting activity       PT Treatment Interventions DME instruction;Balance training;Gait training;Stair training;Functional mobility training;Patient/family education;Therapeutic activities;Therapeutic exercise    PT Goals (Current goals can be found in the Care Plan section)  Acute Rehab PT Goals Patient Stated Goal: get stronger, regain independence PT Goal Formulation: With patient Time For Goal Achievement: 06/02/19 Potential to Achieve Goals: Good    Frequency Min 3X/week   Barriers to discharge        Co-evaluation               AM-PAC PT "6 Clicks" Mobility  Outcome Measure Help needed turning from your back to your side while in a flat bed without using bedrails?: A Little Help needed moving from lying on your back to sitting on the side of a flat bed without using bedrails?: A Little Help needed moving to and from a bed to a chair (including a wheelchair)?: A Little Help needed standing up from a chair using your arms (e.g., wheelchair or bedside chair)?: A Lot Help needed to walk in hospital room?: A Lot Help needed climbing 3-5 steps with a railing? : Total 6 Click Score: 14    End of Session Equipment Utilized During Treatment: Gait belt;Oxygen Activity Tolerance: Patient tolerated treatment well Patient left: in chair;with call bell/phone within reach Nurse Communication: Mobility status PT Visit  Diagnosis: Unsteadiness on feet (R26.81);Difficulty in walking, not elsewhere classified (R26.2);Muscle weakness (generalized) (M62.81)    Time: 0177-9390 PT Time Calculation (min) (ACUTE ONLY): 35 min   Charges:   PT Evaluation $PT Eval Moderate Complexity: 1 Mod PT Treatments $Therapeutic Activity: 8-22 mins        Windell Norfolk, DPT, PN1   Supplemental Physical Therapist [place]    Pager [phone] Acute Rehab Office [phone]

## 2019-05-19 NOTE — Progress Notes (Signed)
[facility]  PROGRESS NOTE  Richard Saunders JME:[id number] DOB: 1940/10/14 DOA: 05/23/2019 PCP: Tonia Ghent, MD   Brief HPI:   79 year old male with medical history of aortic insufficiency, atrial fibrillation on Eliquis, hypertension, LBBB, PVCs, nonalcoholic liver cirrhosis, history of ascites s/p paracentesis last year came with complaints of generalized weakness.  Patient was evaluated in the ED on 113 for abdominal pain and emesis.  Labs were drawn but patient left before seen by provider.  Labs done which showed elevated lipase at 2000 541 and he was advised to try return to the ED.  Patient received his first dose of Copaxone on 113 and has not been feeling well since then.  In the ED was found to be tachycardic, heart rate in 120s, tachypneic, respiratory and 20s.  Hypotensive with blood pressure 85/67.  Chest x-ray showed mild bibasilar atelectasis or infiltrate with associated pleural effusion.  C abdomen showed advanced liver cirrhosis with moderate amount of ascites.  Peripancreatic fat concerning with low level of pancreatitis.  He received IV fluids, IV antibiotics.  CCM was consulted.  Patient admitted with sepsis secondary to SBP and colitis.  Was found to have decompensated liver failure and ascites.  He underwent paracentesis x2.  Also received IV albumin.  Also required thoracentesis.    Subjective   Patient seen and examined, s/p thoracentesis yesterday, still complains shortness of breath.   Assessment/Plan:     1. Sepsis-secondary to SBP/colitis.  Patient received 2.5 L of IV fluids, repeat hypotensive, lactic acid was 4.  Chest x-ray showed infiltrates.  CT abdomen pelvis showed possible colitis.  CCM was consulted.   C. difficile negative.  Patient underwent paracentesis, ascitic fluid analysis showed 80% neutrophils, total nucleated cells 1,365.  Continue ceftriaxone and Flagyl. 2. Acute hypoxic respiratory failure-likely from underlying ascites, left pleural  effusion.  Patient underwent both thoracentesis and paracentesis yesterday.  Also given albumin for SBP protocol.  Breathing has not improved this morning.  Repeat chest x-ray shows improvement.  No clear etiology of shortness of breath.  Pulmonary following. 3. Acute pancreatitis-supportive care, has mild elevation of AST.  Lipase improved to 101.  Started on clear liquid diet.  CT abdomen shows some edema of the peripancreatic fat, likely low-level pancreatitis.  No advanced pancreatitis or pseudocyst. 4. Decompensated liver cirrhosis-patient underwent paracentesis, 2.6 L fluid removed on 05/15/2019.  Albumin 30 g every 6 hours for 4 doses, first day then 40 mg twice daily for 2 days.  Patient underwent paracentesis yesterday, 2.5 L of clear ascitic fluid was obtained. 5. Diarrhea-C. difficile was negative, diarrhea improved.  Continue IV Flagyl, ceftriaxone. 6. Acute kidney injury on CKD stage IIIa-Baseline creatinine 1.2, he presented with creatinine of 2.6.  Likely from hypovolemia, sepsis hypotension.  He received IV fluids.  Creatinine is trending down.  Today creatinine is 1.43.  Patient has chronically low bicarb, in the setting of nonanion gap metabolic acidosis.  Will start bicarb supplementation 650 mg p.o. twice daily 7. Atrial fibrillation-Eliquis on hold, continue metoprolol 12.5 mg p.o. twice daily     SpO2: 94 % O2 Flow Rate (L/min): 3 L/min     Lab Results  Component Value Date   SARSCOV2NAA NEGATIVE 05/29/2019     CBG: No results for input(s): GLUCAP in the last 168 hours.  CBC: Recent Labs  Lab 05/03/2019 1334 05/16/19 0411 05/17/19 0750 05/18/19 0656 05/19/19 0502  WBC 24.8* 14.3* 11.8* 12.1* 10.6*  NEUTROABS 21.9*  --   --   --   --  HGB 17.2* 13.6 14.4 12.8* 12.8*  HCT 50.9 39.5 42.1 37.9* 38.5*  MCV 105.6* 105.3* 105.3* 105.9* 107.8*  PLT 190 148* 159 131* 126*    Basic Metabolic Panel: Recent Labs  Lab 05/15/19 0030 05/16/19 0411 05/17/19 0750  05/18/19 0656 05/19/19 0502  NA 138 138 137 141 136  K 4.7 4.7 4.9 4.7 4.6  CL 109 109 109 108 103  CO2 18* 18* 19* 21* 20*  GLUCOSE 110* 79 91 83 84  BUN 60* 69* 63* 66* 68*  CREATININE 2.19* 1.76* 1.56* 1.43* 1.53*  CALCIUM 6.4* 7.1* 8.0* 8.6* 9.3     Liver Function Tests: Recent Labs  Lab 05/15/19 0030 05/16/19 0411 05/17/19 0750 05/18/19 0656 05/19/19 0502  AST 36 48* 50* 44* 28  ALT 19 27 30 25 20   ALKPHOS 38 43 51 41 38  BILITOT 2.8* 2.9* 2.3* 1.9* 1.8*  PROT 4.9* 5.4* 5.8* 6.0* 6.2*  ALBUMIN 2.5* 2.8*  2.7* 2.8* 3.8 4.3        DVT prophylaxis: Heparin  Code Status: Full code  Family Communication: No family at bedside  Disposition Plan: likely home when medically ready for discharge         Scheduled medications:  . calcium citrate  200 mg of elemental calcium Oral BID  . ipratropium-albuterol  3 mL Nebulization Q6H  . metoprolol succinate  12.5 mg Oral BID    Consultants:  PCCM  Procedures:  Thoracentesis  Paracentesis  Antibiotics:   Anti-infectives (From admission, onward)   Start     Dose/Rate Route Frequency Ordered Stop   05/17/19 1000  cefTRIAXone (ROCEPHIN) 2 g in sodium chloride 0.9 % 100 mL IVPB     2 g 200 mL/hr over 30 Minutes Intravenous Every 24 hours 05/17/19 0919     05/16/19 1330  ceFEPIme (MAXIPIME) 2 g in sodium chloride 0.9 % 100 mL IVPB  Status:  Discontinued     2 g 200 mL/hr over 30 Minutes Intravenous Every 12 hours 05/16/19 1247 05/17/19 0919   05/15/19 1800  vancomycin (VANCOCIN) IVPB 1000 mg/200 mL premix  Status:  Discontinued     1,000 mg 200 mL/hr over 60 Minutes Intravenous  Once 05/15/19 0814 05/15/19 1044   05/15/19 1400  ceFEPIme (MAXIPIME) 2 g in sodium chloride 0.9 % 100 mL IVPB  Status:  Discontinued     2 g 200 mL/hr over 30 Minutes Intravenous Every 24 hours 05/26/2019 1501 05/16/19 1247   05/27/2019 2145  metroNIDAZOLE (FLAGYL) IVPB 500 mg     500 mg 100 mL/hr over 60 Minutes Intravenous  Every 8 hours 05/07/2019 2137     05/09/2019 1500  vancomycin variable dose per unstable renal function (pharmacist dosing)  Status:  Discontinued      Does not apply See admin instructions 05/30/2019 1501 05/15/19 1044   05/12/2019 1400  ceFEPIme (MAXIPIME) 2 g in sodium chloride 0.9 % 100 mL IVPB     2 g 200 mL/hr over 30 Minutes Intravenous  Once 05/23/2019 1355 05/02/2019 1448   05/20/2019 1400  metroNIDAZOLE (FLAGYL) IVPB 500 mg     500 mg 100 mL/hr over 60 Minutes Intravenous  Once 05/27/2019 1355 05/16/2019 1445   05/04/2019 1400  vancomycin (VANCOCIN) IVPB 1000 mg/200 mL premix  Status:  Discontinued     1,000 mg 200 mL/hr over 60 Minutes Intravenous  Once 05/08/2019 1355 05/20/2019 1356   05/13/2019 1400  vancomycin (VANCOREADY) IVPB 1750 mg/350 mL     1,750 mg 175  mL/hr over 120 Minutes Intravenous  Once 05/17/2019 1356 05/17/2019 1923       Objective   Vitals:   05/19/19 1200 05/19/19 1500 05/19/19 1526 05/19/19 1625  BP: 107/64   91/65  Pulse: (!) 119  (!) 114 85  Resp: 16 (!) 23 (!) 24 20  Temp:    97.7 F (36.5 C)  TempSrc:    Oral  SpO2: 93%  93% 94%  Weight:      Height:        Intake/Output Summary (Last 24 hours) at 05/19/2019 1850 Last data filed at 05/19/2019 1700 Gross per 24 hour  Intake 1280.61 ml  Output 725 ml  Net 555.61 ml    01/18 1901 - 01/20 0700 In: 1339.4 [P.O.:680; I.V.:103.9] Out: 1200 [Urine:1200]  Filed Weights   05/18/2019 1336 05/20/2019 2143 05/19/19 0400  Weight: 85.8 kg 87.4 kg 90.5 kg    Physical Examination:    General-appears in mild respiratory distress, tachypneic  Heart-S1-S2, regular, no murmur auscultated  Lungs-clear to auscultation bilaterally, no wheezing or crackles auscultated  Abdomen-soft, distended, nontender to palpation  Extremities-no edema in the lower extremities  Neuro-alert, oriented x3, no focal deficit noted    Data Reviewed:   Recent Results (from the past 240 hour(s))  Blood Culture (routine x 2)     Status: None    Collection Time: 05/01/2019  2:10 PM   Specimen: BLOOD  Result Value Ref Range Status   Specimen Description BLOOD RIGHT ANTECUBITAL  Final   Special Requests   Final    BOTTLES DRAWN AEROBIC ONLY Blood Culture results may not be optimal due to an inadequate volume of blood received in culture bottles   Culture   Final    NO GROWTH 5 DAYS Performed at [facility], [place] [address], [place], [place] [address]    Report Status 05/19/2019 FINAL  Final  Blood Culture (routine x 2)     Status: None   Collection Time: 05/12/2019  2:10 PM   Specimen: BLOOD  Result Value Ref Range Status   Specimen Description BLOOD LEFT ANTECUBITAL  Final   Special Requests   Final    BOTTLES DRAWN AEROBIC AND ANAEROBIC Blood Culture adequate volume   Culture   Final    NO GROWTH 5 DAYS Performed at [facility], [place] [address], [place], [place] [address]    Report Status 05/19/2019 FINAL  Final  Respiratory Panel by RT PCR (Flu A&B, Covid) - Nasopharyngeal Swab     Status: None   Collection Time: 05/23/2019  2:20 PM   Specimen: Nasopharyngeal Swab  Result Value Ref Range Status   SARS Coronavirus 2 by RT PCR NEGATIVE NEGATIVE Final    Comment: (NOTE) SARS-CoV-2 target nucleic acids are NOT DETECTED. The SARS-CoV-2 RNA is generally detectable in upper respiratoy specimens during the acute phase of infection. The lowest concentration of SARS-CoV-2 viral copies this assay can detect is 131 copies/mL. A negative result does not preclude SARS-Cov-2 infection and should not be used as the sole basis for treatment or other patient management decisions. A negative result may occur with  improper specimen collection/handling, submission of specimen other than nasopharyngeal swab, presence of viral mutation(s) within the areas targeted by this assay, and inadequate number of viral copies (<131 copies/mL). A negative result must be combined with clinical observations, patient history, and  epidemiological information. The expected result is Negative. Fact Sheet for Patients:  PinkCheek.be Fact Sheet for Healthcare Providers:  GravelBags.it This test is not yet ap proved or cleared by the [place] and  has been authorized for detection and/or diagnosis of SARS-CoV-2 by FDA under an Emergency Use Authorization (EUA). This EUA will remain  in effect (meaning this test can be used) for the duration of the COVID-19 declaration under Section 564(b)(1) of the Act, 21 U.S.C. section 360bbb-3(b)(1), unless the authorization is terminated or revoked sooner.    Influenza A by PCR NEGATIVE NEGATIVE Final   Influenza B by PCR NEGATIVE NEGATIVE Final    Comment: (NOTE) The Xpert Xpress SARS-CoV-2/FLU/RSV assay is intended as an aid in  the diagnosis of influenza from Nasopharyngeal swab specimens and  should not be used as a sole basis for treatment. Nasal washings and  aspirates are unacceptable for Xpert Xpress SARS-CoV-2/FLU/RSV  testing. Fact Sheet for Patients: PinkCheek.be Fact Sheet for Healthcare Providers: GravelBags.it This test is not yet approved or cleared by the [place] FDA and  has been authorized for detection and/or diagnosis of SARS-CoV-2 by  FDA under an Emergency Use Authorization (EUA). This EUA will remain  in effect (meaning this test can be used) for the duration of the  Covid-19 declaration under Section 564(b)(1) of the Act, 21  U.S.C. section 360bbb-3(b)(1), unless the authorization is  terminated or revoked. Performed at [facility], [place] [address], [place], [place] [address]   C difficile quick scan w PCR reflex     Status: None   Collection Time: 05/15/19  1:35 AM   Specimen: STOOL  Result Value Ref Range Status   C Diff antigen NEGATIVE NEGATIVE Final   C Diff toxin NEGATIVE NEGATIVE Final   C Diff  interpretation No C. difficile detected.  Final    Comment: Performed at [facility], [place] [address], [place], [place] [address]  GI pathogen panel by PCR, stool     Status: None   Collection Time: 05/15/19  1:35 AM   Specimen: Stool  Result Value Ref Range Status   Plesiomonas shigelloides NOT DETECTED NOT DETECTED Final   Yersinia enterocolitica NOT DETECTED NOT DETECTED Final   Vibrio NOT DETECTED NOT DETECTED Final   Enteropathogenic E coli NOT DETECTED NOT DETECTED Final   E coli (ETEC) LT/ST NOT DETECTED NOT DETECTED Final   E coli 9798 by PCR Not applicable NOT DETECTED Final   Cryptosporidium by PCR NOT DETECTED NOT DETECTED Final   Entamoeba histolytica NOT DETECTED NOT DETECTED Final   Adenovirus F 40/41 NOT DETECTED NOT DETECTED Final   Norovirus GI/GII NOT DETECTED NOT DETECTED Final   Sapovirus NOT DETECTED NOT DETECTED Final    Comment: (NOTE) Performed At: [facility] [place], [place] [id number] Rush Farmer MD YC:[id number]    Vibrio cholerae NOT DETECTED NOT DETECTED Final   Campylobacter by PCR NOT DETECTED NOT DETECTED Final   Salmonella by PCR NOT DETECTED NOT DETECTED Final   E coli (STEC) NOT DETECTED NOT DETECTED Final   Enteroaggregative E coli NOT DETECTED NOT DETECTED Final   Shigella by PCR NOT DETECTED NOT DETECTED Final   Cyclospora cayetanensis NOT DETECTED NOT DETECTED Final   Astrovirus NOT DETECTED NOT DETECTED Final   G lamblia by PCR NOT DETECTED NOT DETECTED Final   Rotavirus A by PCR NOT DETECTED NOT DETECTED Final  Urine culture     Status: None   Collection Time: 05/15/19  9:10 AM   Specimen: In/Out Cath Urine  Result Value Ref Range Status  Specimen Description IN/OUT CATH URINE  Final   Special Requests NONE  Final   Culture   Final    NO GROWTH Performed at [facility], [place] [address], [place], [place] [address]    Report Status 05/16/2019 FINAL  Final  Body fluid culture     Status: None    Collection Time: 05/16/19  3:12 PM   Specimen: Peritoneal Washings; Body Fluid  Result Value Ref Range Status   Specimen Description PERITONEAL  Final   Special Requests Normal  Final   Gram Stain   Final    FEW WBC PRESENT,BOTH PMN AND MONONUCLEAR NO ORGANISMS SEEN    Culture   Final    NO GROWTH 3 DAYS Performed at [facility], [address], [place], [place] [address]    Report Status 05/19/2019 FINAL  Final  Body fluid culture     Status: None (Preliminary result)   Collection Time: 05/18/19  5:29 PM   Specimen: Body Fluid  Result Value Ref Range Status   Specimen Description FLUID PARACENTESIS  Final   Special Requests NONE  Final   Gram Stain   Final    FEW WBC PRESENT, PREDOMINANTLY PMN NO ORGANISMS SEEN    Culture   Final    NO GROWTH < 24 HOURS Performed at [facility], [address] [place], [place] [address]    Report Status PENDING  Incomplete  Body fluid culture     Status: None (Preliminary result)   Collection Time: 05/18/19  5:31 PM   Specimen: Body Fluid  Result Value Ref Range Status   Specimen Description FLUID LEFT PLEURAL  Final   Special Requests NONE  Final   Gram Stain   Final    FEW WBC PRESENT,BOTH PMN AND MONONUCLEAR NO ORGANISMS SEEN    Culture   Final    NO GROWTH < 24 HOURS Performed at [facility], [place] [address], [place], [place] [address]    Report Status PENDING  Incomplete    Recent Labs  Lab 05/13/19 0002 05/16/2019 1334  LIPASE 2,501* 101*   Recent Labs  Lab 05/18/19 0943  AMMONIA 25    Cardiac Enzymes: No results for input(s): CKTOTAL, CKMB, CKMBINDEX, TROPONINI in the last 168 hours. BNP (last 3 results) Recent Labs    05/28/18 1115  BNP 73.5    ProBNP (last 3 results) Recent Labs    06/05/18 1356 06/11/18 1644  PROBNP 116.0* 81.0    Studies:  DG Chest 1 View  Result Date: 05/18/2019 CLINICAL DATA:  Status post thoracentesis EXAM: CHEST  1 VIEW COMPARISON:  Chest radiograph from  one day prior. FINDINGS: Low lung volumes. Stable cardiomediastinal silhouette with normal heart size. No pneumothorax. Stable mild elevation of the left hemidiaphragm. Trace residual left pleural effusion, decreased. No right pleural effusion. Mild streaky parahilar opacities in both lungs, similar. Mildly improved aeration at the left lung base with persistent left lung base atelectasis. IMPRESSION: 1. No pneumothorax. 2. Trace residual left pleural effusion, decreased. Stable elevation of the left hemidiaphragm. 3. Mildly improved aeration at the left lung base with persistent left lung base atelectasis. Streaky mild bilateral parahilar lung opacities, similar, favor atelectasis/vascular crowding given low lung volumes. Electronically Signed   By: Ilona Sorrel M.D.   On: 05/18/2019 13:40   DG CHEST PORT 1 VIEW  Result Date: 05/19/2019 CLINICAL DATA:  Dyspnea. EXAM: PORTABLE CHEST 1 VIEW COMPARISON:  May 18, 2019. FINDINGS: Stable  cardiomegaly. No pneumothorax is noted. Right lung is clear. Moderate left pleural effusion is noted with probable underlying atelectasis or infiltrate. Bony thorax is unremarkable. IMPRESSION: Moderate left pleural effusion with probable underlying atelectasis or infiltrate. Electronically Signed   By: Marijo Conception M.D.   On: 05/19/2019 08:55     Admission status: Inpatient: Based on patients clinical presentation and evaluation of above clinical data, I have made determination that patient meets Inpatient criteria at this time.   Oswald Hillock   [facility] If 7PM-7AM, please contact night-coverage at [email or web address], Office  [phone]  password [email or web address]  05/19/2019, 6:50 PM  LOS: 5 days

## 2019-05-19 NOTE — Progress Notes (Signed)
[place] for Heparin Indication: atrial fibrillation  Allergies  Allergen Reactions  . Colchicine Diarrhea  . Fluticasone Propionate Other (See Comments)    He didn't tolerate the taste/smell prev  . Nasacort [Triamcinolone] Other (See Comments)    Didn't tolerate    Patient Measurements: Height: 6' 1"  (185.4 cm) Weight: 199 lb 8.3 oz (90.5 kg) IBW/kg (Calculated) : 79.9 Heparin Dosing Weight: 87kg  Vital Signs: Temp: 97.4 F (36.3 C) (01/20 0750) Temp Source: Oral (01/20 0750) BP: 108/79 (01/20 0750) Pulse Rate: 116 (01/20 0750)  Labs: Recent Labs     0000 05/17/19 0750 05/17/19 2009 05/17/19 2009 05/18/19 0656 05/18/19 2232 05/19/19 0502  HGB   < > 14.4  --   --  12.8*  --  12.8*  HCT  --  42.1  --   --  37.9*  --  38.5*  PLT  --  159  --   --  131*  --  126*  APTT  --   --  >200*  --  144* 85*  --   LABPROT  --   --   --   --  21.2*  --   --   INR  --   --   --   --  1.9*  --   --   HEPARINUNFRC  --   --  1.04*   < > 0.76* 0.31 0.27*  CREATININE  --  1.56*  --   --  1.43*  --  1.53*   < > = values in this interval not displayed.    Estimated Creatinine Clearance: 45 mL/min (A) (by C-G formula based on SCr of 1.53 mg/dL (H)).   Medical History: Past Medical History:  Diagnosis Date  . Allergy   . Anxiety   . Aortic insufficiency    a. 04/2018 Echo: EF 55-60%. Mild to Mod MR. Mild AI/TR.  Marland Kitchen Atrial fibrillation ([place])    a. Dx 04/2018. CHA2DS2VASc = 4-->Eliquis.  . Cirrhosis ([place])   . Depression   . H/O: rheumatic fever 2012   (per patient) - eval by [facility] LBBB with PVC's  . History of chicken pox   . Hypertension   . LBBB (left bundle branch block)   . Organic impotence   . PVC's (premature ventricular contractions)    Assessment: 79 year old male undergoing medical workup and may need thoracentesis but decision is pending. Patient with history of afib and has been off his apixaban. Discussed with  primary team today, given the possible need of invasive procedure, will hold off on starting apixaban and start IV heparin in the interim which can easily be turned off/on as needed.   S/p paracentesis 1/19  Heparin level 0.27 this AM   Goal of Therapy:  Heparin level 0.3-0.7 units/ml Monitor platelets by anticoagulation protocol: Yes   Plan:  Increase heparin to 800 units / hr  Follow up AM labs  Thank you Anette Guarneri, PharmD [phone]

## 2019-05-19 NOTE — Progress Notes (Signed)
NAME:  Richard Saunders, Richard Saunders, Richard Saunders, Richard Saunders, Richard Saunders REFERRING MD: Shela Leff, MD  CHIEF COMPLAINT:  Hypotension  Brief History   79 year old male with cirrhosis with SBP- presented with N/V/D and admitted for sepsis secondary to GI etiology. PCCM consulted for hypotension. Following for chronic left pleural effusion.  History of present illness   [name] is a 79 year old male with the below medical history who presented to the ED for abdominal pain, nausea, vomiting and weakness on 1/13-1/14. He left AMA however his lab results after he left demonstrated elevated lipase 2501. He returned to the ED with similar symptoms as well as new onset diarrhea. Prior to admission he has had poor PO intake, decreased UOP and tachycardia in the last week. His toprolol was increased by his Cardiologist for tachycardia. Of note, he did receive COVID vaccination on 1/13 but states symptoms started prior to this.   In the ED he was hypotensive with BP 85/67. Labs significant for WBC 24.8, K 5.5, BUN/Cr 58/2.6 and LA 4.3. CT A/P with cirrhosis and ascites, pancreatitis and bilateral pleural effusion. ED [place] with no pocket seen for ascites drainage. Given IVF and broad spectrum antibiotics with improvement in hypotension.  1/16, patient with persistent hypotension. Has received total 5.5L overnight and on mIVF 125/hr.   Past Medical History  Atrial fibrillation on Eliquis, hx of PVS, aortic insufficiency, HTN, non-alcoholic cirrhosis, hx of ascites, anxiety/depression  Significant Hospital Events   1/13 Presented to ED with N/V/abdominal pain. Left AMA 1/15 Returned to ED  Consults:  [facility] PCCM  Procedures:  1/17 para 1/19 para 1/19 thora  Significant Diagnostic Tests:  CT A/P Advanced cirrhosis of the liver, ascites, pancreatitis, bilateral pleural effusions (L>R)  Ascitic fluid 1/17: 1,365 WBCs (80%  PMNs) Ascitic fluid 1/19: WBC 1015 (79% PMNs), LD 112, albumin <1, protein <3, gram stain WBCs L Pleural fluid 1/19: WBC 835 (47% PMNs), LD 122, albumin <1, protein <3, glucose 86, gram stain WBCs  Micro Data:  BCX 1/15 >> C.diff 1/16 negative Ascitic fluid 1/17- few WBC >> Urine culture 1/16 >>NG  Antimicrobials:  Vanc 1/15 Cefepime 1/15> 1/17 Flagyl 1/15 > Ceftriaxone 1/18>>  Interim history/subjective:  Felt better for a while after para and thora yesterday, slept all day. He feels worse again this morning, like he did yesterday. He has not been OOB yet but wants to try. He has not been using IS due to feeling like he is too SOB. Minimal PO intake due to mild abdominal pain. He reports chronic wheezing at home related to sinus issues. Former smoker, not in PTA inhalers.  Objective   Blood pressure 108/79, pulse (!) 116, temperature (!) 97.4 F (36.3 C), temperature source Oral, resp. rate 19, height 6' 1"  (1.854 m), weight 90.5 kg, SpO2 94 %.        Intake/Output Summary (Last 24 hours) at 05/19/2019 0835 Last data filed at 05/19/2019 0751 Gross per 24 hour  Intake 718.75 ml  Output 925 ml  Net -206.25 ml   Filed Weights   05/30/2019 1336 05/11/2019 2143 05/19/19 0400  Weight: 85.8 kg 87.4 kg 90.5 kg    Physical Exam: General: frail-appearing elderly man laying in bed in NAD HENT: [place]/AT, eyes anicteric Respiratory: Tachypneic, shallow breaths. Mild anterior wheezing bilaterally. 3L [place].  Able to speak in full sentences. Cardiovascular: tachycardic, regular rhythm, no murmurs GI: soft, NT, NT to  palpation Extremities: no clubbing, cyanosis, or edema Neuro: awake and alert, answering questions appropriately, globally very weak, but no focal deficits Skin: no bruising or bleeding, pallor Psych: cooperative with exam, more depressed affect today    Resolved Hospital Problem list     Assessment & Plan:   Acute hypoxic respiratory failure- worse today. Likely  multifactorial due to atelectasis from immobility and weakness. Conconcered for reaccumulation of pleural fluid or ascites, although his fluid balance is negative (pleural and peritoneal fluid removed not included in I/Os).  -mobilize- needs to be up in the chair today to promote good pulmonary mechanics. PT & OT consults placed. -duonebs Q6h; may need long-acting inhalers at discharge -repeat CXR -Maintain net even fluid balance as possible to limit reaccumulation of pleural and ascitic fluid. -consider oral bicarb repletion given ongoing NAGMA likely 2/2 renal failure  Left pleural effusion- spontaneous bacterial pleuritis 2/2 SBP & translocation.  -CXR today to monitor for reaccumulation -maintain net even fluid balance -cultures & path from pleural fluid pending   Severe sepsis secondary to SBP -Continue abx: ceftriaxone & Flagyl -needs lifelong SBP prophylaxis  Afib with RVR --con't heparin for [facility] -recommend increasing Bblocker for rate control  Decompensated Cirrhosis with ascites, SBP. Hyperbilirubinemia improving. -path & cultures from ascites pending  AKI, likely prerenal-continuing to improve. Baseline Cr 1.2. -con't to monitor -strict I/Os  We will continue to follow.  Best practice:  Diet: Per primary Pain/Anxiety/Delirium protocol (if indicated): -- VAP protocol (if indicated): -- DVT prophylaxis: heparin infusion GI prophylaxis: -- Glucose control: Per primary Mobility: As tolerated Code Status: Full Disposition:    Labs   CBC: Recent Labs  Lab 05/11/2019 1334 05/16/19 0411 05/17/19 0750 05/18/19 0656 05/19/19 0502  WBC 24.8* 14.3* 11.8* 12.1* 10.6*  NEUTROABS 21.9*  --   --   --   --   HGB 17.2* 13.6 14.4 12.8* 12.8*  HCT 50.9 39.5 42.1 37.9* 38.5*  MCV 105.6* 105.3* 105.3* 105.9* 107.8*  PLT 190 148* 159 131* 126*    Basic Metabolic Panel: Recent Labs  Lab 05/15/19 0030 05/16/19 0411 05/17/19 0750 05/18/19 0656 05/19/19 0502  NA 138 138  137 141 136  K 4.7 4.7 4.9 4.7 4.6  CL 109 109 109 108 103  CO2 18* 18* 19* 21* 20*  GLUCOSE 110* 79 91 83 84  BUN 60* 69* 63* 66* 68*  CREATININE 2.19* 1.76* 1.56* 1.43* 1.53*  CALCIUM 6.4* 7.1* 8.0* 8.6* 9.3   GFR: Estimated Creatinine Clearance: 45 mL/min (A) (by C-G formula based on SCr of 1.53 mg/dL (H)). Recent Labs  Lab 05/25/2019 1334 05/12/2019 1735 05/15/19 0030 05/15/19 0955 05/15/19 1321 05/16/19 0411 05/17/19 0750 05/18/19 0656 05/19/19 0502  WBC   < >  --   --   --   --  14.3* 11.8* 12.1* 10.6*  LATICACIDVEN  --  3.8* 4.0* 3.2* 2.5*  --   --   --   --    < > = values in this interval not displayed.    Liver Function Tests: Recent Labs  Lab 05/15/19 0030 05/16/19 0411 05/17/19 0750 05/18/19 0656 05/19/19 0502  AST 36 48* 50* 44* 28  ALT 19 27 30 25 20   ALKPHOS 38 43 51 41 38  BILITOT 2.8* 2.9* 2.3* 1.9* 1.8*  PROT 4.9* 5.4* 5.8* 6.0* 6.2*  ALBUMIN 2.5* 2.8*  2.7* 2.8* 3.8 4.3   Recent Labs  Lab 05/13/19 0002 05/03/2019 1334  LIPASE 2,501* 101*   Recent Labs  Lab 05/18/19 0943  AMMONIA 25    ABG    Component Value Date/Time   TCO2 24 07/25/2011 1411     Coagulation Profile: Recent Labs  Lab 05/18/2019 1410 05/18/19 0656  INR 2.8* 1.9*    Cardiac Enzymes: No results for input(s): CKTOTAL, CKMB, CKMBINDEX, TROPONINI in the last 168 hours.  HbA1C: Hgb A1c MFr Bld  Date/Time Value Ref Range Status  07/29/2016 09:14 AM 5.2 4.6 - 6.5 % Final    Comment:    Glycemic Control Guidelines for People with Diabetes:Non Diabetic:  <6%Goal of Therapy: <7%Additional Action Suggested:  >8%   07/22/2012 09:41 AM 5.5 4.6 - 6.5 % Final    Comment:    Glycemic Control Guidelines for People with Diabetes:Non Diabetic:  <6%Goal of Therapy: <7%Additional Action Suggested:  >8%       Julian Hy, DO 05/19/19 8:48 AM [facility]

## 2019-05-19 NOTE — Progress Notes (Signed)
[place] for Heparin Indication: atrial fibrillation  Allergies  Allergen Reactions  . Colchicine Diarrhea  . Fluticasone Propionate Other (See Comments)    He didn't tolerate the taste/smell prev  . Nasacort [Triamcinolone] Other (See Comments)    Didn't tolerate    Patient Measurements: Height: 6' 1"  (185.4 cm) Weight: 192 lb 11.2 oz (87.4 kg) IBW/kg (Calculated) : 79.9 Heparin Dosing Weight: 87kg  Vital Signs: Temp: 97.5 F (36.4 C) (01/20 0000) Temp Source: Oral (01/20 0000) BP: 105/73 (01/20 0000) Pulse Rate: 111 (01/20 0000)  Labs: Recent Labs    05/16/19 0411 05/16/19 0411 05/17/19 0750 05/17/19 2009 05/18/19 0656 05/18/19 2232  HGB 13.6   < > 14.4  --  12.8*  --   HCT 39.5  --  42.1  --  37.9*  --   PLT 148*  --  159  --  131*  --   APTT  --   --   --  >200* 144* 85*  LABPROT  --   --   --   --  21.2*  --   INR  --   --   --   --  1.9*  --   HEPARINUNFRC  --   --   --  1.04* 0.76* 0.31  CREATININE 1.76*  --  1.56*  --  1.43*  --    < > = values in this interval not displayed.    Estimated Creatinine Clearance: 48.1 mL/min (A) (by C-G formula based on SCr of 1.43 mg/dL (H)).   Medical History: Past Medical History:  Diagnosis Date  . Allergy   . Anxiety   . Aortic insufficiency    a. 04/2018 Echo: EF 55-60%. Mild to Mod MR. Mild AI/TR.  Marland Kitchen Atrial fibrillation ([place])    a. Dx 04/2018. CHA2DS2VASc = 4-->Eliquis.  . Cirrhosis ([place])   . Depression   . H/O: rheumatic fever 2012   (per patient) - eval by [facility] LBBB with PVC's  . History of chicken pox   . Hypertension   . LBBB (left bundle branch block)   . Organic impotence   . PVC's (premature ventricular contractions)    Assessment: 79 year old male undergoing medical workup and may need thoracentesis but decision is pending. Patient with history of afib and has been off his apixaban. Discussed with primary team today, given the possible need of  invasive procedure, will hold off on starting apixaban and start IV heparin in the interim which can easily be turned off/on as needed.   Heparin level and aPTT both elevated this am but trending down on lower dose. No S/Sx bleeding, appears labs were drawn appropriately.   Orders to hold heparin today for thoracentesis and now re-started  1/20 AM update:  Heparin level therapeutic after re-start No longer need aPTT monitoring   Goal of Therapy:  Heparin level 0.3-0.7 units/ml Monitor platelets by anticoagulation protocol: Yes   Plan:  Cont heparin at 700 units/hr Confirmatory heparin level with AM labs  Narda Bonds, PharmD, [place] Pharmacist Phone: [phone]

## 2019-05-20 LAB — CBC
HCT: 36.5 % — ABNORMAL LOW (ref 39.0–52.0)
Hemoglobin: 12.3 g/dL — ABNORMAL LOW (ref 13.0–17.0)
MCH: 36 pg — ABNORMAL HIGH (ref 26.0–34.0)
MCHC: 33.7 g/dL (ref 30.0–36.0)
MCV: 106.7 fL — ABNORMAL HIGH (ref 80.0–100.0)
Platelets: 112 10*3/uL — ABNORMAL LOW (ref 150–400)
RBC: 3.42 MIL/uL — ABNORMAL LOW (ref 4.22–5.81)
RDW: 13.6 % (ref 11.5–15.5)
WBC: 8.6 10*3/uL (ref 4.0–10.5)
nRBC: 0 % (ref 0.0–0.2)

## 2019-05-20 LAB — COMPREHENSIVE METABOLIC PANEL
ALT: 17 U/L (ref 0–44)
AST: 26 U/L (ref 15–41)
Albumin: 4.6 g/dL (ref 3.5–5.0)
Alkaline Phosphatase: 32 U/L — ABNORMAL LOW (ref 38–126)
Anion gap: 12 (ref 5–15)
BUN: 67 mg/dL — ABNORMAL HIGH (ref 8–23)
CO2: 20 mmol/L — ABNORMAL LOW (ref 22–32)
Calcium: 9.7 mg/dL (ref 8.9–10.3)
Chloride: 108 mmol/L (ref 98–111)
Creatinine, Ser: 1.46 mg/dL — ABNORMAL HIGH (ref 0.61–1.24)
GFR calc Af Amer: 53 mL/min — ABNORMAL LOW (ref >60)
GFR calc non Af Amer: 45 mL/min — ABNORMAL LOW (ref >60)
Glucose, Bld: 98 mg/dL (ref 70–99)
Potassium: 4.5 mmol/L (ref 3.5–5.1)
Sodium: 140 mmol/L (ref 135–145)
Total Bilirubin: 1.5 mg/dL — ABNORMAL HIGH (ref 0.3–1.2)
Total Protein: 6.1 g/dL — ABNORMAL LOW (ref 6.5–8.1)

## 2019-05-20 LAB — CYTOLOGY - NON PAP

## 2019-05-20 LAB — HEPARIN LEVEL (UNFRACTIONATED)
Heparin Unfractionated: 0.21 IU/mL — ABNORMAL LOW (ref 0.30–0.70)
Heparin Unfractionated: 0.21 IU/mL — ABNORMAL LOW (ref 0.30–0.70)

## 2019-05-20 MED ORDER — ALBUMIN HUMAN 25 % IV SOLN
50.0000 g | Freq: Two times a day (BID) | INTRAVENOUS | Status: DC
  Administered 2019-05-20: 50 g via INTRAVENOUS
  Filled 2019-05-20 (×2): qty 200

## 2019-05-20 MED ORDER — HEPARIN BOLUS VIA INFUSION
1500.0000 [IU] | Freq: Once | INTRAVENOUS | Status: AC
  Administered 2019-05-20: 1500 [IU] via INTRAVENOUS
  Filled 2019-05-20: qty 1500

## 2019-05-20 NOTE — Progress Notes (Signed)
ANTICOAGULATION CONSULT NOTE - Follow Up Consult  Pharmacy Consult for heparin Indication: atrial fibrillation  Labs: Recent Labs    05/18/19 0656 05/18/19 0656 05/18/19 2232 05/18/19 2232 05/19/19 0502 05/20/19 0247 05/20/19 1251 05/20/19 2158  HGB 12.8*   < >  --   --  12.8* 12.3*  --   --   HCT 37.9*  --   --   --  38.5* 36.5*  --   --   PLT 131*  --   --   --  126* 112*  --   --   APTT 144*  --  85*  --   --   --   --   --   LABPROT 21.2*  --   --   --   --   --   --   --   INR 1.9*  --   --   --   --   --   --   --   HEPARINUNFRC 0.76*   < > 0.31   < > 0.27*  --  0.21* 0.21*  CREATININE 1.43*  --   --   --  1.53* 1.46*  --   --    < > = values in this interval not displayed.    Assessment: 79yo male remains subtherapeutic on heparin with same level despite rate increase; no gtt issues or signs of bleeding per nurse.  Goal of Therapy:  Heparin level 0.3-0.7 units/ml   Plan:  Will increase heparin gtt by 1-2 units/kg/hr to 1100 units/hr and check level in 8 hours.    Wynona Neat, PharmD, BCPS  05/20/2019,11:22 PM

## 2019-05-20 NOTE — Progress Notes (Signed)
Inpatient Rehabilitation Admissions Coordinator  Inpatient rehab consult received. I will see patient tomorrow for rehab assessment and recommendations.  Danne Baxter, RN, MSN Rehab Admissions Coordinator [phone] 05/20/2019 6:01 PM

## 2019-05-20 NOTE — Progress Notes (Signed)
Rehab Admissions Coordinator Note:  Noted pt continues to have IP Rehab needs.  AC will place consult order in the chart to allow for further follow up and assessment of pt's candidacy for IP Rehab.    Raechel Ache 05/20/2019, 5:11 PM  I can be reached at [phone].

## 2019-05-20 NOTE — Evaluation (Signed)
Occupational Therapy Evaluation Patient Details Name: Richard Saunders MRN: [id number] DOB: June 15, 1940 Today's Date: 05/20/2019    History of Present Illness 79yo male c/o generalized weakness and malaise. Had first covid vaccine on 05/12/19, has not felt well since. Hypotensive in the ED, covid negative. Admitted for severe sepsis of unknown etiology, acutely decompensated liver cirrhosis. PMH anxiety, aortic insufficiency, A-fib, HTN, LBBB   Clinical Impression   Patient is a 79 year old male that lives alone in a single level home with 3 steps to enter. At baseline patient is fully independent with self care, IADLs, drives. Currently patient requires min A for functional mobility and transfers with x2 posterior loss of balance using rolling walker to ambulate from EOB to chair approximately 6 feet. Patient reports feeling fatigued and LE weakness after transfer. OT educate patient in bed level and chair level exercises to work on strengthening. Recommend continued acute OT services to maximize patient safety and independence with self care.    Follow Up Recommendations  CIR;Supervision/Assistance - 24 hour    Equipment Recommendations  Other (comment)(defer to next venue)       Precautions / Restrictions Precautions Precautions: Fall;Other (comment) Precaution Comments: watch vitals, easily fatigued Restrictions Weight Bearing Restrictions: No      Mobility Bed Mobility Overal bed mobility: Needs Assistance Bed Mobility: Supine to Sit     Supine to sit: Min assist     General bed mobility comments: minA for trunk  Transfers Overall transfer level: Needs assistance Equipment used: Rolling walker (2 wheeled) Transfers: Sit to/from Stand Sit to Stand: Min assist         General transfer comment: min A to power up to standing, cues for body mechanics, min A with ambulation due to posterior loss of balance    Balance Overall balance assessment: Needs  assistance Sitting-balance support: No upper extremity supported;Feet supported Sitting balance-Leahy Scale: Good     Standing balance support: Bilateral upper extremity supported;During functional activity Standing balance-Leahy Scale: Poor Standing balance comment: reliant on BUE support, min A due to loss of balance                           ADL either performed or assessed with clinical judgement   ADL Overall ADL's : Needs assistance/impaired Eating/Feeding: Independent   Grooming: Set up;Sitting   Upper Body Bathing: Set up;Sitting   Lower Body Bathing: Moderate assistance;Sitting/lateral leans;Sit to/from stand   Upper Body Dressing : Set up;Sitting   Lower Body Dressing: Moderate assistance;Sitting/lateral leans;Sit to/from stand   Toilet Transfer: Minimal assistance;RW;BSC;Cueing for Office manager Details (indicate cue type and reason): simulated with recliner transfer, min A for balance due to posterior loss of balance during ambulation x2, decreased activity tolerance Toileting- Clothing Manipulation and Hygiene: Minimal assistance;Sitting/lateral lean;Sit to/from stand       Functional mobility during ADLs: Minimal assistance;Cueing for safety;Cueing for sequencing;Rolling walker General ADL Comments: patient requires increased assistance with self care tasks due to decreased activity tolerance, strength, balance                  Pertinent Vitals/Pain Pain Assessment: No/denies pain     Hand Dominance Right   Extremity/Trunk Assessment Upper Extremity Assessment Upper Extremity Assessment: Generalized weakness   Lower Extremity Assessment Lower Extremity Assessment: Defer to PT evaluation       Communication Communication Communication: HOH   Cognition Arousal/Alertness: Awake/alert Behavior During Therapy: [facility] for tasks assessed/performed Overall Cognitive  Status: Within Functional Limits for tasks assessed                                  General Comments: very polite and participatory, just very fatigued. Good command and cue following, motivated to mobilize   General Comments  VSS on 3L O2            Home Living Family/patient expects to be discharged to:: Private residence Living Arrangements: Alone Available Help at Discharge: Family;Available PRN/intermittently Type of Home: House Home Access: Stairs to enter [employer] of Steps: 3 Entrance Stairs-Rails: Right Home Layout: One level     Bathroom Shower/Tub: Teacher, early years/pre: Standard     Home Equipment: None          Prior Functioning/Environment Level of Independence: Independent        Comments: Drives        OT Problem List: Decreased strength;Decreased activity tolerance;Impaired balance (sitting and/or standing);Decreased safety awareness      OT Treatment/Interventions: Self-care/ADL training;Therapeutic exercise;Energy conservation;DME and/or AE instruction;Therapeutic activities;Patient/family education;Balance training    OT Goals(Current goals can be found in the care plan section) Acute Rehab OT Goals Patient Stated Goal: get stronger, regain independence OT Goal Formulation: With patient Time For Goal Achievement: 06/03/19 Potential to Achieve Goals: Good  OT Frequency: Min 2X/week    AM-PAC OT "6 Clicks" Daily Activity     Outcome Measure Help from another person eating meals?: None Help from another person taking care of personal grooming?: A Little Help from another person toileting, which includes using toliet, bedpan, or urinal?: A Little Help from another person bathing (including washing, rinsing, drying)?: A Lot Help from another person to put on and taking off regular upper body clothing?: A Little Help from another person to put on and taking off regular lower body clothing?: A Lot 6 Click Score: 17   End of Session Equipment Utilized During Treatment:  Rolling walker;Oxygen Nurse Communication: Mobility status  Activity Tolerance: Patient tolerated treatment well Patient left: in chair;with call bell/phone within reach  OT Visit Diagnosis: Other abnormalities of gait and mobility (R26.89);Muscle weakness (generalized) (M62.81)                Time: 5366-4403 OT Time Calculation (min): 39 min Charges:  OT General Charges $OT Visit: 1 Visit OT Evaluation $OT Eval Moderate Complexity: 1 Mod OT Treatments $Self Care/Home Management : 23-37 mins  Shon Millet OT OT office: [place] 05/20/2019, 1:33 PM

## 2019-05-20 NOTE — Progress Notes (Signed)
NAME:  Richard Saunders, MRN:  [id number], DOB:  [birth year], LOS: 6 ADMISSION DATE:  05/15/2019, CONSULTATION DATE:  05/15/19 REFERRING MD: Shela Leff, MD  CHIEF COMPLAINT:  Hypotension  Brief History   79 year old male with cirrhosis with SBP- presented with N/V/D and admitted for sepsis secondary to GI etiology. PCCM consulted for hypotension. Following for chronic left pleural effusion.  History of present illness   Richard Saunders is a 79 year old male with the below medical history who presented to the ED for abdominal pain, nausea, vomiting and weakness on 1/13-1/14. He left AMA however his lab results after he left demonstrated elevated lipase 2501. He returned to the ED with similar symptoms as well as new onset diarrhea. Prior to admission he has had poor PO intake, decreased UOP and tachycardia in the last week. His toprolol was increased by his Cardiologist for tachycardia. Of note, he did receive COVID vaccination on 1/13 but states symptoms started prior to this.   In the ED he was hypotensive with BP 85/67. Labs significant for WBC 24.8, K 5.5, BUN/Cr 58/2.6 and LA 4.3. CT A/P with cirrhosis and ascites, pancreatitis and bilateral pleural effusion. ED [place] with no pocket seen for ascites drainage. Given IVF and broad spectrum antibiotics with improvement in hypotension.  1/16, patient with persistent hypotension. Has received total 5.5L overnight and on mIVF 125/hr.   Past Medical History  Atrial fibrillation on Eliquis, hx of PVS, aortic insufficiency, HTN, non-alcoholic cirrhosis, hx of ascites, anxiety/depression  Significant Hospital Events   1/13 Presented to ED with N/V/abdominal pain. Left AMA 1/15 Returned to ED  Consults:  [facility] PCCM  Procedures:  1/17 para 1/19 para 1/19 thora  Significant Diagnostic Tests:  CT A/P Advanced cirrhosis of the liver, ascites, pancreatitis, bilateral pleural effusions (L>R)  Ascitic fluid 1/17: 1,365 WBCs (80%  PMNs) Ascitic fluid 1/19: WBC 1015 (79% PMNs), LD 112, albumin <1, protein <3, gram stain WBCs L Pleural fluid 1/19: WBC 835 (47% PMNs), LD 122, albumin <1, protein <3, glucose 86, gram stain WBCs  Path L pleural fluid 1/19: negative for malignancy  Micro Data:  BCX 1/15 >> C.diff 1/16 negative Ascitic fluid 1/17- few WBC > NG 1/19 ascitic fluid >> few WBC >> 1/19 pleural fluid>>  few WBC >> Urine culture 1/16 >>NG  Antimicrobials:  Vanc 1/15 Cefepime 1/15> 1/17 Flagyl 1/15 > Ceftriaxone 1/18>>  Interim history/subjective:  Feeling better today, still weak.  Objective   Blood pressure 117/87, pulse (!) 123, temperature 98.2 F (36.8 C), temperature source Oral, resp. rate (!) 22, height 6' 1"  (1.854 m), weight 90.5 kg, SpO2 97 %.        Intake/Output Summary (Last 24 hours) at 05/20/2019 1359 Last data filed at 05/20/2019 1258 Gross per 24 hour  Intake 668.09 ml  Output 400 ml  Net 268.09 ml   Filed Weights   05/15/2019 1336 05/25/2019 2143 05/19/19 0400  Weight: 85.8 kg 87.4 kg 90.5 kg    Physical Exam: General: frail-appearing elderly man sitting up in the chair, more energetic than previous exams HENT: [place]/AT, eyes anicteric Respiratory: tachypnea improved, less wheezing, breathing comfortably on [place] Cardiovascular: minimal tachycardia, irreg irreg rhythm GI: soft, NT, ND Extremities: no c/c/e Neuro: awake and alert, globally weak, no focal deficits Skin: pallor, no rashes Psych: more interactive, cooperative with exam    Resolved Hospital Problem list     Assessment & Plan:   Acute hypoxic respiratory failure- improving today. Likely multifactorial  due to atelectasis from immobility and weakness plus compression from previous left effusion. Has chronically elevated left hemidiaphragm.  -con't OOB mobility as much as possible -optimize pulmonary hygiene, frequent IS -con't nebs scheduled; likely needs maintenance inhaler (LAMA) at discharge -maintain  euvolemia; recommend fluid and sodium restrictions to prevent reaccumulation of ascites -consider oral bicarb repletion given ongoing NAGMA likely 2/2 renal failure  Left pleural effusion- spontaneous bacterial pleuritis 2/2 SBP & translocation.  -maintain euvolemia -con't to follow cultures until finalized -con't anitbiotics for SBP, then needs chronic prophylaxis  Severe sepsis secondary to SBP -Continue to biotics -Needs lifelong SBP prophylaxis  Decompensated Cirrhosis with ascites, SBP Hyperbilirubinemia improving. -Recommend home diuretic regimen, fluid and sodium restrictions  AKI, likely prerenal-continuing to improve. Baseline Cr 1.2. -Continue to monitor -Strict I's/O  We will sign off.  Please call with questions.  Best practice:  Diet: Per primary Pain/Anxiety/Delirium protocol (if indicated): -- VAP protocol (if indicated): -- DVT prophylaxis: heparin infusion GI prophylaxis: -- Glucose control: Per primary Mobility: As tolerated Code Status: Full Disposition:    Labs   CBC: Recent Labs  Lab 05/21/2019 1334 05/05/2019 1334 05/16/19 0411 05/17/19 0750 05/18/19 0656 05/19/19 0502 05/20/19 0247  WBC 24.8*   < > 14.3* 11.8* 12.1* 10.6* 8.6  NEUTROABS 21.9*  --   --   --   --   --   --   HGB 17.2*   < > 13.6 14.4 12.8* 12.8* 12.3*  HCT 50.9   < > 39.5 42.1 37.9* 38.5* 36.5*  MCV 105.6*   < > 105.3* 105.3* 105.9* 107.8* 106.7*  PLT 190   < > 148* 159 131* 126* 112*   < > = values in this interval not displayed.    Basic Metabolic Panel: Recent Labs  Lab 05/16/19 0411 05/17/19 0750 05/18/19 0656 05/19/19 0502 05/20/19 0247  NA 138 137 141 136 140  K 4.7 4.9 4.7 4.6 4.5  CL 109 109 108 103 108  CO2 18* 19* 21* 20* 20*  GLUCOSE 79 91 83 84 98  BUN 69* 63* 66* 68* 67*  CREATININE 1.76* 1.56* 1.43* 1.53* 1.46*  CALCIUM 7.1* 8.0* 8.6* 9.3 9.7   GFR: Estimated Creatinine Clearance: 47.1 mL/min (A) (by C-G formula based on SCr of 1.46 mg/dL  (H)). Recent Labs  Lab 05/16/2019 1735 05/15/19 0030 05/15/19 0955 05/15/19 1321 05/16/19 0411 05/17/19 0750 05/18/19 0656 05/19/19 0502 05/20/19 0247  WBC  --   --   --   --    < > 11.8* 12.1* 10.6* 8.6  LATICACIDVEN 3.8* 4.0* 3.2* 2.5*  --   --   --   --   --    < > = values in this interval not displayed.    Liver Function Tests: Recent Labs  Lab 05/16/19 0411 05/17/19 0750 05/18/19 0656 05/19/19 0502 05/20/19 0247  AST 48* 50* 44* 28 26  ALT 27 30 25 20 17   ALKPHOS 43 51 41 38 32*  BILITOT 2.9* 2.3* 1.9* 1.8* 1.5*  PROT 5.4* 5.8* 6.0* 6.2* 6.1*  ALBUMIN 2.8*  2.7* 2.8* 3.8 4.3 4.6   Recent Labs  Lab 05/20/2019 1334  LIPASE 101*   Recent Labs  Lab 05/18/19 0943  AMMONIA 25    ABG    Component Value Date/Time   TCO2 24 07/25/2011 1411     Coagulation Profile: Recent Labs  Lab 05/19/2019 1410 05/18/19 0656  INR 2.8* 1.9*    Cardiac Enzymes: No results for input(s): CKTOTAL,  CKMB, CKMBINDEX, TROPONINI in the last 168 hours.  HbA1C: Hgb A1c MFr Bld  Date/Time Value Ref Range Status  07/29/2016 09:14 AM 5.2 4.6 - 6.5 % Final    Comment:    Glycemic Control Guidelines for People with Diabetes:Non Diabetic:  <6%Goal of Therapy: <7%Additional Action Suggested:  >8%   07/22/2012 09:41 AM 5.5 4.6 - 6.5 % Final    Comment:    Glycemic Control Guidelines for People with Diabetes:Non Diabetic:  <6%Goal of Therapy: <7%Additional Action Suggested:  >8%       Julian Hy, DO 05/20/19 1:59 PM [facility]

## 2019-05-20 NOTE — Progress Notes (Signed)
[place] for Heparin Indication: atrial fibrillation  Allergies  Allergen Reactions  . Colchicine Diarrhea  . Fluticasone Propionate Other (See Comments)    He didn't tolerate the taste/smell prev  . Nasacort [Triamcinolone] Other (See Comments)    Didn't tolerate    Patient Measurements: Height: 6' 1"  (185.4 cm) Weight: 199 lb 8.3 oz (90.5 kg) IBW/kg (Calculated) : 79.9 Heparin Dosing Weight: 87kg  Vital Signs: Temp: 98.2 F (36.8 C) (01/21 1200) Temp Source: Oral (01/21 1200) BP: 117/87 (01/21 1200) Pulse Rate: 123 (01/21 1200)  Labs: Recent Labs    05/17/19 2009 05/17/19 2009 05/18/19 0656 05/18/19 0656 05/18/19 2232 05/19/19 0502 05/20/19 0247 05/20/19 1251  HGB  --   --  12.8*   < >  --  12.8* 12.3*  --   HCT  --   --  37.9*  --   --  38.5* 36.5*  --   PLT  --   --  131*  --   --  126* 112*  --   APTT >200*  --  144*  --  85*  --   --   --   LABPROT  --   --  21.2*  --   --   --   --   --   INR  --   --  1.9*  --   --   --   --   --   HEPARINUNFRC 1.04*   < > 0.76*   < > 0.31 0.27*  --  0.21*  CREATININE  --   --  1.43*  --   --  1.53* 1.46*  --    < > = values in this interval not displayed.    Estimated Creatinine Clearance: 47.1 mL/min (A) (by C-G formula based on SCr of 1.46 mg/dL (H)).   Medical History: Past Medical History:  Diagnosis Date  . Allergy   . Anxiety   . Aortic insufficiency    a. 04/2018 Echo: EF 55-60%. Mild to Mod MR. Mild AI/TR.  Marland Kitchen Atrial fibrillation ([place])    a. Dx 04/2018. CHA2DS2VASc = 4-->Eliquis.  . Cirrhosis ([place])   . Depression   . H/O: rheumatic fever 2012   (per patient) - eval by [facility] LBBB with PVC's  . History of chicken pox   . Hypertension   . LBBB (left bundle branch block)   . Organic impotence   . PVC's (premature ventricular contractions)    Assessment: 79 year old male on apixaban PTA for afin with SPB on antibiotics. He is s/p paracentesis and apixaban is  on hold (last dose 1/15). Pharmacy dosing heparin  -Hg= 12.3, plt= 112 (trend down but hx low plt)    Goal of Therapy:  Heparin level 0.3-0.7 units/ml Monitor platelets by anticoagulation protocol: Yes   Plan:  -heparin bolus 1500 units x1 -increase heparin to 950 units/hr -Heparin level in 8 hours and daily wth CBC daily  Hildred Laser, PharmD Clinical Pharmacist **Pharmacist phone directory can now be found on [email or web address] ([email or web address]).  Listed under [place].

## 2019-05-20 NOTE — Progress Notes (Signed)
[facility]  PROGRESS NOTE  Richard Saunders QZR:[id number] DOB: [birth year] DOA: 05/25/2019 PCP: Tonia Ghent, MD   Brief HPI:   79 year old male with medical history of aortic insufficiency, atrial fibrillation on Eliquis, hypertension, LBBB, PVCs, nonalcoholic liver cirrhosis, history of ascites s/p paracentesis last year came with complaints of generalized weakness.  Patient was evaluated in the ED on 113 for abdominal pain and emesis.  Labs were drawn but patient left before seen by provider.  Labs done which showed elevated lipase at 2000 541 and he was advised to try return to the ED.  Patient received his first dose of Copaxone on 113 and has not been feeling well since then.  In the ED was found to be tachycardic, heart rate in 120s, tachypneic, respiratory and 20s.  Hypotensive with blood pressure 85/67.  Chest x-ray showed mild bibasilar atelectasis or infiltrate with associated pleural effusion.  C abdomen showed advanced liver cirrhosis with moderate amount of ascites.  Peripancreatic fat concerning with low level of pancreatitis.  He received IV fluids, IV antibiotics.  CCM was consulted.  Patient admitted with sepsis secondary to SBP and colitis.  Was found to have decompensated liver failure and ascites.  He underwent paracentesis x2.  Also received IV albumin.  Also required thoracentesis.    Subjective   Patient seen and examined, breathing better than yesterday.   Assessment/Plan:     1. Sepsis-secondary to SBP/colitis.  Patient received 2.5 L of IV fluids, repeat hypotensive, lactic acid was 4.  Chest x-ray showed infiltrates.  CT abdomen pelvis showed possible colitis.  CCM was consulted.   C. difficile negative.  Patient underwent paracentesis, ascitic fluid analysis showed 80% neutrophils, total nucleated cells 1,365.  Continue ceftriaxone and Flagyl. 2. Acute hypoxic respiratory failure-likely from underlying ascites, left pleural effusion.  Patient underwent  both thoracentesis and paracentesis yesterday.  Also given albumin for SBP protocol.  Breathing has not improved this morning.  Repeat chest x-ray shows improvement.  No clear etiology of shortness of breath.  Pulmonary following. 3. Acute pancreatitis-supportive care, has mild elevation of AST.  Lipase improved to 101.  Started on clear liquid diet.  CT abdomen shows some edema of the peripancreatic fat, likely low-level pancreatitis.  No advanced pancreatitis or pseudocyst. 4. Decompensated liver cirrhosis-patient underwent paracentesis, 2.6 L fluid removed on 05/15/2019.  Albumin 30 g every 6 hours for 4 doses, first day then 40 mg twice daily for 2 days.  Patient underwent paracentesis yesterday, 2.5 L of clear ascitic fluid was obtained. 5. Diarrhea-C. difficile was negative, diarrhea improved.  Continue IV Flagyl, ceftriaxone. 6. Acute kidney injury on CKD stage IIIa-Baseline creatinine 1.2, he presented with creatinine of 2.6.  Likely from hypovolemia, sepsis hypotension.  He received IV fluids.  Creatinine is trending down.  Today creatinine is 1.43.  Patient has chronically low bicarb, in the setting of nonanion gap metabolic acidosis.  Started on  bicarb supplementation 650 mg p.o. twice daily 7. Atrial fibrillation-Eliquis on hold, continue metoprolol 12.5 mg p.o. twice daily     SpO2: 97 % O2 Flow Rate (L/min): 3 L/min     Lab Results  Component Value Date   SARSCOV2NAA NEGATIVE 05/06/2019     CBG: No results for input(s): GLUCAP in the last 168 hours.  CBC: Recent Labs  Lab 05/10/2019 1334 05/23/2019 1334 05/16/19 0411 05/17/19 0750 05/18/19 0656 05/19/19 0502 05/20/19 0247  WBC 24.8*   < > 14.3* 11.8* 12.1* 10.6* 8.6  NEUTROABS 21.9*  --   --   --   --   --   --  HGB 17.2*   < > 13.6 14.4 12.8* 12.8* 12.3*  HCT 50.9   < > 39.5 42.1 37.9* 38.5* 36.5*  MCV 105.6*   < > 105.3* 105.3* 105.9* 107.8* 106.7*  PLT 190   < > 148* 159 131* 126* 112*   < > = values in this  interval not displayed.    Basic Metabolic Panel: Recent Labs  Lab 05/16/19 0411 05/17/19 0750 05/18/19 0656 05/19/19 0502 05/20/19 0247  NA 138 137 141 136 140  K 4.7 4.9 4.7 4.6 4.5  CL 109 109 108 103 108  CO2 18* 19* 21* 20* 20*  GLUCOSE 79 91 83 84 98  BUN 69* 63* 66* 68* 67*  CREATININE 1.76* 1.56* 1.43* 1.53* 1.46*  CALCIUM 7.1* 8.0* 8.6* 9.3 9.7     Liver Function Tests: Recent Labs  Lab 05/16/19 0411 05/17/19 0750 05/18/19 0656 05/19/19 0502 05/20/19 0247  AST 48* 50* 44* 28 26  ALT 27 30 25 20 17   ALKPHOS 43 51 41 38 32*  BILITOT 2.9* 2.3* 1.9* 1.8* 1.5*  PROT 5.4* 5.8* 6.0* 6.2* 6.1*  ALBUMIN 2.8*  2.7* 2.8* 3.8 4.3 4.6        DVT prophylaxis: Heparin  Code Status: Full code  Family Communication: No family at bedside  Disposition Plan: likely home when medically ready for discharge         Scheduled medications:  . calcium citrate  200 mg of elemental calcium Oral BID  . ipratropium-albuterol  3 mL Nebulization TID  . metoprolol succinate  12.5 mg Oral BID  . sodium bicarbonate  650 mg Oral BID    Consultants:  PCCM  Procedures:  Thoracentesis  Paracentesis  Antibiotics:   Anti-infectives (From admission, onward)   Start     Dose/Rate Route Frequency Ordered Stop   05/17/19 1000  cefTRIAXone (ROCEPHIN) 2 g in sodium chloride 0.9 % 100 mL IVPB     2 g 200 mL/hr over 30 Minutes Intravenous Every 24 hours 05/17/19 0919     05/16/19 1330  ceFEPIme (MAXIPIME) 2 g in sodium chloride 0.9 % 100 mL IVPB  Status:  Discontinued     2 g 200 mL/hr over 30 Minutes Intravenous Every 12 hours 05/16/19 1247 05/17/19 0919   05/15/19 1800  vancomycin (VANCOCIN) IVPB 1000 mg/200 mL premix  Status:  Discontinued     1,000 mg 200 mL/hr over 60 Minutes Intravenous  Once 05/15/19 0814 05/15/19 1044   05/15/19 1400  ceFEPIme (MAXIPIME) 2 g in sodium chloride 0.9 % 100 mL IVPB  Status:  Discontinued     2 g 200 mL/hr over 30 Minutes  Intravenous Every 24 hours 04/30/2019 1501 05/16/19 1247   05/13/2019 2145  metroNIDAZOLE (FLAGYL) IVPB 500 mg     500 mg 100 mL/hr over 60 Minutes Intravenous Every 8 hours 05/30/2019 2137     05/10/2019 1500  vancomycin variable dose per unstable renal function (pharmacist dosing)  Status:  Discontinued      Does not apply See admin instructions 05/01/2019 1501 05/15/19 1044   05/05/2019 1400  ceFEPIme (MAXIPIME) 2 g in sodium chloride 0.9 % 100 mL IVPB     2 g 200 mL/hr over 30 Minutes Intravenous  Once 05/22/2019 1355 05/05/2019 1448   05/30/2019 1400  metroNIDAZOLE (FLAGYL) IVPB 500 mg     500 mg 100 mL/hr over 60 Minutes Intravenous  Once 05/07/2019 1355 05/26/2019 1445   05/01/2019 1400  vancomycin (VANCOCIN) IVPB 1000 mg/200 mL premix  Status:  Discontinued     1,000 mg 200 mL/hr over 60 Minutes Intravenous  Once 05/20/2019 1355 05/16/2019 1356   05/10/2019 1400  vancomycin (VANCOREADY) IVPB 1750 mg/350 mL     1,750 mg 175 mL/hr over 120 Minutes Intravenous  Once 05/13/2019 1356 05/13/2019 1923       Objective   Vitals:   05/20/19 0510 05/20/19 0800 05/20/19 1052 05/20/19 1200  BP:  97/65 118/90 117/87  Pulse:  (!) 110 92 (!) 123  Resp:  20 20 (!) 22  Temp:  97.6 F (36.4 C)  98.2 F (36.8 C)  TempSrc:  Oral  Oral  SpO2: 96% 95% 92% 97%  Weight:      Height:        Intake/Output Summary (Last 24 hours) at 05/20/2019 1619 Last data filed at 05/20/2019 1258 Gross per 24 hour  Intake 626 ml  Output 400 ml  Net 226 ml    01/19 1901 - 01/21 0700 In: 1766.6 [P.O.:570; I.V.:241.2] Out: 825 [Urine:825]  Filed Weights   05/23/2019 1336 05/19/2019 2143 05/19/19 0400  Weight: 85.8 kg 87.4 kg 90.5 kg    Physical Examination:    General-appears in no acute distress  Heart-S1-S2, regular, no murmur auscultated  Lungs-clear to auscultation bilaterally, no wheezing or crackles auscultated  Abdomen-soft, nontender, no organomegaly  Extremities-no edema in the lower extremities  Neuro-alert,  oriented x3, no focal deficit noted    Data Reviewed:   Recent Results (from the past 240 hour(s))  Blood Culture (routine x 2)     Status: None   Collection Time: 05/26/2019  2:10 PM   Specimen: BLOOD  Result Value Ref Range Status   Specimen Description BLOOD RIGHT ANTECUBITAL  Final   Special Requests   Final    BOTTLES DRAWN AEROBIC ONLY Blood Culture results may not be optimal due to an inadequate volume of blood received in culture bottles   Culture   Final    NO GROWTH 5 DAYS Performed at [facility], [place] [address], [place], [place] [address]    Report Status 05/19/2019 FINAL  Final  Blood Culture (routine x 2)     Status: None   Collection Time: 05/02/2019  2:10 PM   Specimen: BLOOD  Result Value Ref Range Status   Specimen Description BLOOD LEFT ANTECUBITAL  Final   Special Requests   Final    BOTTLES DRAWN AEROBIC AND ANAEROBIC Blood Culture adequate volume   Culture   Final    NO GROWTH 5 DAYS Performed at [facility], [place] [address], [place], [place] [address]    Report Status 05/19/2019 FINAL  Final  Respiratory Panel by RT PCR (Flu A&B, Covid) - Nasopharyngeal Swab     Status: None   Collection Time: 05/25/2019  2:20 PM   Specimen: Nasopharyngeal Swab  Result Value Ref Range Status   SARS Coronavirus 2 by RT PCR NEGATIVE NEGATIVE Final    Comment: (NOTE) SARS-CoV-2 target nucleic acids are NOT DETECTED. The SARS-CoV-2 RNA is generally detectable in upper respiratoy specimens during the acute phase of infection. The lowest concentration of SARS-CoV-2 viral copies this assay can detect is 131 copies/mL. A negative result does not preclude SARS-Cov-2 infection and should not be used as the sole basis for treatment or other patient management decisions. A negative result may occur with  improper specimen collection/handling, submission of specimen other than nasopharyngeal swab, presence of viral mutation(s) within the areas targeted by this assay, and  inadequate number of viral copies (<131 copies/mL). A negative result must be combined with clinical observations, patient history, and epidemiological information. The expected result is Negative. Fact Sheet for Patients:  PinkCheek.be Fact Sheet for Healthcare Providers:  GravelBags.it This test is not yet ap proved or cleared by the [place] FDA and  has been authorized for detection and/or diagnosis of SARS-CoV-2 by FDA under an Emergency Use Authorization (EUA). This EUA will remain  in effect (meaning this test can be used) for the duration of the COVID-19 declaration under Section 564(b)(1) of the Act, 21 U.S.C. section 360bbb-3(b)(1), unless the authorization is terminated or revoked sooner.    Influenza A by PCR NEGATIVE NEGATIVE Final   Influenza B by PCR NEGATIVE NEGATIVE Final    Comment: (NOTE) The Xpert Xpress SARS-CoV-2/FLU/RSV assay is intended as an aid in  the diagnosis of influenza from Nasopharyngeal swab specimens and  should not be used as a sole basis for treatment. Nasal washings and  aspirates are unacceptable for Xpert Xpress SARS-CoV-2/FLU/RSV  testing. Fact Sheet for Patients: PinkCheek.be Fact Sheet for Healthcare Providers: GravelBags.it This test is not yet approved or cleared by the [place] FDA and  has been authorized for detection and/or diagnosis of SARS-CoV-2 by  FDA under an Emergency Use Authorization (EUA). This EUA will remain  in effect (meaning this test can be used) for the duration of the  Covid-19 declaration under Section 564(b)(1) of the Act, 21  U.S.C. section 360bbb-3(b)(1), unless the authorization is  terminated or revoked. Performed at [facility], [place] [address], [place], [place] [address]   C difficile quick scan w PCR reflex     Status: None   Collection Time: 05/15/19  1:35 AM   Specimen:  STOOL  Result Value Ref Range Status   C Diff antigen NEGATIVE NEGATIVE Final   C Diff toxin NEGATIVE NEGATIVE Final   C Diff interpretation No C. difficile detected.  Final    Comment: Performed at [facility], [place] [address], [place],  [address]  GI pathogen panel by PCR, stool     Status: None   Collection Time: 05/15/19  1:35 AM   Specimen: Stool  Result Value Ref Range Status   Plesiomonas shigelloides NOT DETECTED NOT DETECTED Final   Yersinia enterocolitica NOT DETECTED NOT DETECTED Final   Vibrio NOT DETECTED NOT DETECTED Final   Enteropathogenic E coli NOT DETECTED NOT DETECTED Final   E coli (ETEC) LT/ST NOT DETECTED NOT DETECTED Final   E coli 2094 by PCR Not applicable NOT DETECTED Final   Cryptosporidium by PCR NOT DETECTED NOT DETECTED Final   Entamoeba histolytica NOT DETECTED NOT DETECTED Final   Adenovirus F 40/41 NOT DETECTED NOT DETECTED Final   Norovirus GI/GII NOT DETECTED NOT DETECTED Final   Sapovirus NOT DETECTED NOT DETECTED Final    Comment: (NOTE) Performed At: [facility] [place], [place] [id number] Rush Farmer MD QH:[id number]    Vibrio cholerae NOT DETECTED NOT DETECTED Final   Campylobacter by PCR NOT DETECTED NOT DETECTED Final   Salmonella by PCR NOT DETECTED NOT DETECTED Final   E coli (STEC) NOT DETECTED NOT DETECTED Final   Enteroaggregative E coli NOT DETECTED NOT DETECTED Final   Shigella by PCR NOT DETECTED NOT DETECTED Final   Cyclospora cayetanensis NOT DETECTED NOT DETECTED Final   Astrovirus NOT DETECTED NOT DETECTED Final   G lamblia by PCR NOT DETECTED NOT DETECTED Final  Rotavirus A by PCR NOT DETECTED NOT DETECTED Final  Urine culture     Status: None   Collection Time: 05/15/19  9:10 AM   Specimen: In/Out Cath Urine  Result Value Ref Range Status   Specimen Description IN/OUT CATH URINE  Final   Special Requests NONE  Final   Culture   Final    NO GROWTH Performed at [facility], [place] [address], [place], [place] [address]    Report Status 05/16/2019 FINAL  Final  Body fluid culture     Status: None   Collection Time: 05/16/19  3:12 PM   Specimen: Peritoneal Washings; Body Fluid  Result Value Ref Range Status   Specimen Description PERITONEAL  Final   Special Requests Normal  Final   Gram Stain   Final    FEW WBC PRESENT,BOTH PMN AND MONONUCLEAR NO ORGANISMS SEEN    Culture   Final    NO GROWTH 3 DAYS Performed at [facility], [address] [place], [place] [address]    Report Status 05/19/2019 FINAL  Final  Body fluid culture     Status: None (Preliminary result)   Collection Time: 05/18/19  5:29 PM   Specimen: Body Fluid  Result Value Ref Range Status   Specimen Description FLUID PARACENTESIS  Final   Special Requests NONE  Final   Gram Stain   Final    FEW WBC PRESENT, PREDOMINANTLY PMN NO ORGANISMS SEEN    Culture   Final    NO GROWTH 2 DAYS Performed at [facility], [address], [place], [place] [address]    Report Status PENDING  Incomplete  Body fluid culture     Status: None (Preliminary result)   Collection Time: 05/18/19  5:31 PM   Specimen: Body Fluid  Result Value Ref Range Status   Specimen Description FLUID LEFT PLEURAL  Final   Special Requests NONE  Final   Gram Stain   Final    FEW WBC PRESENT,BOTH PMN AND MONONUCLEAR NO ORGANISMS SEEN    Culture   Final    NO GROWTH 2 DAYS Performed at [facility], [address], [place], [place] [address]    Report Status PENDING  Incomplete    Recent Labs  Lab 05/02/2019 1334  LIPASE 101*   Recent Labs  Lab 05/18/19 0943  AMMONIA 25    Cardiac Enzymes: No results for input(s): CKTOTAL, CKMB, CKMBINDEX, TROPONINI in the last 168 hours. BNP (last 3 results) Recent Labs    05/28/18 1115  BNP 73.5    ProBNP (last 3 results) Recent Labs    06/05/18 1356 06/11/18 1644  PROBNP 116.0* 81.0    Studies:  DG CHEST PORT 1 VIEW  Result  Date: 05/19/2019 CLINICAL DATA:  Dyspnea. EXAM: PORTABLE CHEST 1 VIEW COMPARISON:  May 18, 2019. FINDINGS: Stable cardiomegaly. No pneumothorax is noted. Right lung is clear. Moderate left pleural effusion is noted with probable underlying atelectasis or infiltrate. Bony thorax is unremarkable. IMPRESSION: Moderate left pleural effusion with probable underlying atelectasis or infiltrate. Electronically Signed   By: Marijo Conception M.D.   On: 05/19/2019 08:55     Admission status: Inpatient: Based on patients clinical presentation and evaluation of above clinical data, I have made determination that patient meets Inpatient criteria at this time.   Oswald Hillock   [facility] If 7PM-7AM, please contact night-coverage at [email or web address], Office  [phone]  password [facility]  05/20/2019, 4:19  PM  LOS: 6 days

## 2019-05-21 LAB — COMPREHENSIVE METABOLIC PANEL
ALT: 16 U/L (ref 0–44)
AST: 25 U/L (ref 15–41)
Albumin: 4.8 g/dL (ref 3.5–5.0)
Alkaline Phosphatase: 29 U/L — ABNORMAL LOW (ref 38–126)
Anion gap: 10 (ref 5–15)
BUN: 65 mg/dL — ABNORMAL HIGH (ref 8–23)
CO2: 25 mmol/L (ref 22–32)
Calcium: 9.9 mg/dL (ref 8.9–10.3)
Chloride: 105 mmol/L (ref 98–111)
Creatinine, Ser: 1.39 mg/dL — ABNORMAL HIGH (ref 0.61–1.24)
GFR calc Af Amer: 56 mL/min — ABNORMAL LOW (ref >60)
GFR calc non Af Amer: 48 mL/min — ABNORMAL LOW (ref >60)
Glucose, Bld: 132 mg/dL — ABNORMAL HIGH (ref 70–99)
Potassium: 4.3 mmol/L (ref 3.5–5.1)
Sodium: 140 mmol/L (ref 135–145)
Total Bilirubin: 1.2 mg/dL (ref 0.3–1.2)
Total Protein: 6.1 g/dL — ABNORMAL LOW (ref 6.5–8.1)

## 2019-05-21 LAB — CBC
HCT: 40.4 % (ref 39.0–52.0)
Hemoglobin: 13.2 g/dL (ref 13.0–17.0)
MCH: 35.4 pg — ABNORMAL HIGH (ref 26.0–34.0)
MCHC: 32.7 g/dL (ref 30.0–36.0)
MCV: 108.3 fL — ABNORMAL HIGH (ref 80.0–100.0)
Platelets: 131 10*3/uL — ABNORMAL LOW (ref 150–400)
RBC: 3.73 MIL/uL — ABNORMAL LOW (ref 4.22–5.81)
RDW: 14.3 % (ref 11.5–15.5)
WBC: 11.6 10*3/uL — ABNORMAL HIGH (ref 4.0–10.5)
nRBC: 0 % (ref 0.0–0.2)

## 2019-05-21 LAB — HEPARIN LEVEL (UNFRACTIONATED)
Heparin Unfractionated: 0.15 IU/mL — ABNORMAL LOW (ref 0.30–0.70)
Heparin Unfractionated: 0.28 IU/mL — ABNORMAL LOW (ref 0.30–0.70)

## 2019-05-21 LAB — BODY FLUID CULTURE: Culture: NO GROWTH

## 2019-05-21 MED ORDER — HEPARIN BOLUS VIA INFUSION
2000.0000 [IU] | Freq: Once | INTRAVENOUS | Status: AC
  Administered 2019-05-21: 2000 [IU] via INTRAVENOUS
  Filled 2019-05-21: qty 2000

## 2019-05-21 NOTE — Progress Notes (Signed)
Physical Therapy Treatment Patient Details Name: Richard Saunders MRN: [id number] DOB: [birth year] Today's Date: 05/21/2019    History of Present Illness 79yo male c/o generalized weakness and malaise. Had first covid vaccine on 05/12/19, has not felt well since. Hypotensive in the ED, covid negative. Admitted for severe sepsis of unknown etiology, acutely decompensated liver cirrhosis. PMH anxiety, aortic insufficiency, A-fib, HTN, LBBB    PT Comments    Patient is making progress toward PT goals and tolerated increased gait distance this session. Pt does continue to present with generalized weakness and decreased activity tolerance. Pt on 3L O2 via [place] throughout session. Continue to recommend CIR for further skilled PT services to maximize independence and safety with mobility.     Follow Up Recommendations  CIR;Supervision/Assistance - 24 hour     Equipment Recommendations  Rolling walker with 5" wheels;3in1 (PT)    Recommendations for Other Services       Precautions / Restrictions Precautions Precautions: Fall Restrictions Weight Bearing Restrictions: No    Mobility  Bed Mobility               General bed mobility comments: pt OOB in chair upon arrival  Transfers Overall transfer level: Needs assistance Equipment used: Rolling walker (2 wheeled) Transfers: Sit to/from Stand Sit to Stand: Min assist         General transfer comment: cues for safe hand placement; assist to power up   Ambulation/Gait Ambulation/Gait assistance: Min guard;Min assist;+2 safety/equipment(chair follow ) Gait Distance (Feet): (~120 ft total with seated rest break) Assistive device: Rolling walker (2 wheeled) Gait Pattern/deviations: Step-through pattern;Decreased stride length;Trunk flexed Gait velocity: decreased   General Gait Details: cues for upright posture and forward gaze; pt tends to keep head down due to sore neck; seated break due to fatigue; SpO2 >95% on 3L O2 via  [place]   Stairs             Wheelchair Mobility    Modified Rankin (Stroke Patients Only)       Balance Overall balance assessment: Needs assistance Sitting-balance support: No upper extremity supported;Feet supported Sitting balance-Leahy Scale: Good     Standing balance support: Bilateral upper extremity supported;During functional activity Standing balance-Leahy Scale: Poor                              Cognition Arousal/Alertness: Awake/alert Behavior During Therapy: WFL for tasks assessed/performed Overall Cognitive Status: Within Functional Limits for tasks assessed                                        Exercises      General Comments        Pertinent Vitals/Pain Pain Assessment: Faces Faces Pain Scale: Hurts a little bit Pain Location: back Pain Descriptors / Indicators: Sore Pain Intervention(s): Limited activity within patient's tolerance;Monitored during session;Repositioned    Home Living                      Prior Function            PT Goals (current goals can now be found in the care plan section) Progress towards PT goals: Progressing toward goals    Frequency    Min 3X/week      PT Plan Current plan remains appropriate    Co-evaluation  AM-PAC PT "6 Clicks" Mobility   Outcome Measure  Help needed turning from your back to your side while in a flat bed without using bedrails?: A Little Help needed moving from lying on your back to sitting on the side of a flat bed without using bedrails?: A Little Help needed moving to and from a bed to a chair (including a wheelchair)?: A Little Help needed standing up from a chair using your arms (e.g., wheelchair or bedside chair)?: A Little Help needed to walk in hospital room?: A Little Help needed climbing 3-5 steps with a railing? : Total 6 Click Score: 16    End of Session Equipment Utilized During Treatment: Gait  belt;Oxygen Activity Tolerance: Patient tolerated treatment well Patient left: in chair;with call bell/phone within reach Nurse Communication: Mobility status PT Visit Diagnosis: Unsteadiness on feet (R26.81);Difficulty in walking, not elsewhere classified (R26.2);Muscle weakness (generalized) (M62.81)     Time: 5929-2446 PT Time Calculation (min) (ACUTE ONLY): 27 min  Charges:  $Gait Training: 23-37 mins                     Earney Navy, PTA Acute Rehabilitation Services Pager: [phone] Office: [phone]     Darliss Cheney 05/21/2019, 5:21 PM

## 2019-05-21 NOTE — Progress Notes (Signed)
[place] for Heparin Indication: atrial fibrillation  Allergies  Allergen Reactions  . Colchicine Diarrhea  . Fluticasone Propionate Other (See Comments)    He didn't tolerate the taste/smell prev  . Nasacort [Triamcinolone] Other (See Comments)    Didn't tolerate    Patient Measurements: Height: 6' 1"  (185.4 cm) Weight: 199 lb 8.3 oz (90.5 kg) IBW/kg (Calculated) : 79.9 Heparin Dosing Weight: 87kg  Vital Signs: Temp: 97.6 F (36.4 C) (01/22 0800) Temp Source: Oral (01/22 0800) BP: 113/89 (01/22 0800) Pulse Rate: 90 (01/22 0800)  Labs: Recent Labs    05/18/19 2232 05/18/19 2232 05/19/19 0502 05/19/19 0502 05/20/19 0247 05/20/19 1251 05/20/19 2158 05/21/19 0611  HGB  --   --  12.8*   < > 12.3*  --   --  13.2  HCT  --   --  38.5*  --  36.5*  --   --  40.4  PLT  --   --  126*  --  112*  --   --  131*  APTT 85*  --   --   --   --   --   --   --   HEPARINUNFRC 0.31   < > 0.27*   < >  --  0.21* 0.21* 0.15*  CREATININE  --   --  1.53*  --  1.46*  --   --  1.39*   < > = values in this interval not displayed.    Estimated Creatinine Clearance: 49.5 mL/min (A) (by C-G formula based on SCr of 1.39 mg/dL (H)).   Medical History: Past Medical History:  Diagnosis Date  . Allergy   . Anxiety   . Aortic insufficiency    a. 04/2018 Echo: EF 55-60%. Mild to Mod MR. Mild AI/TR.  Marland Kitchen Atrial fibrillation ([place])    a. Dx 04/2018. CHA2DS2VASc = 4-->Eliquis.  . Cirrhosis ([place])   . Depression   . H/O: rheumatic fever 2012   (per patient) - eval by [facility] LBBB with PVC's  . History of chicken pox   . Hypertension   . LBBB (left bundle branch block)   . Organic impotence   . PVC's (premature ventricular contractions)    Assessment: 79 year old male on apixaban PTA for afin with SPB on antibiotics. He is s/p paracentesis and apixaban is on hold (last dose 1/15). Pharmacy dosing heparin   Heparin level subtherapeutic at 0.15 s/p rate  increase to 1100 units/hr, no infusion issues and rate confirmed by RN.  Goal of Therapy:  Heparin level 0.3-0.7 units/ml Monitor platelets by anticoagulation protocol: Yes   Plan:  Heparin 2000 units IV x1 bolus Increase heparin gtt to 1250 units/hr F/u 8 hour heparin level  Bertis Ruddy, PharmD Clinical Pharmacist Please check AMION for all [place] numbers 05/21/2019 8:26 AM

## 2019-05-21 NOTE — Progress Notes (Addendum)
ANTICOAGULATION CONSULT NOTE   Pharmacy Consult for Heparin Indication: atrial fibrillation  Patient Measurements: Height: 6' 1"  (185.4 cm) Weight: 199 lb 8.3 oz (90.5 kg) IBW/kg (Calculated) : 79.9 Heparin Dosing Weight: 87kg  Vital Signs: Temp: 97.9 F (36.6 C) (01/22 1711) Temp Source: Oral (01/22 1711) BP: 107/92 (01/22 1711) Pulse Rate: 128 (01/22 1711)  Labs: Recent Labs    05/18/19 2232 05/18/19 2232 05/19/19 0502 05/19/19 0502 05/20/19 0247 05/20/19 1251 05/20/19 2158 05/21/19 0611 05/21/19 1641  HGB  --   --  12.8*   < > 12.3*  --   --  13.2  --   HCT  --   --  38.5*  --  36.5*  --   --  40.4  --   PLT  --   --  126*  --  112*  --   --  131*  --   APTT 85*  --   --   --   --   --   --   --   --   HEPARINUNFRC 0.31   < > 0.27*  --   --    < > 0.21* 0.15* 0.28*  CREATININE  --   --  1.53*  --  1.46*  --   --  1.39*  --    < > = values in this interval not displayed.    Estimated Creatinine Clearance: 49.5 mL/min (A) (by C-G formula based on SCr of 1.39 mg/dL (H)).  Assessment: 79 year old male on apixaban PTA for afib (CHADS2VASc = 4) with SPB on antibiotics. He is s/p paracentesis and apixaban is on hold (last dose 1/15). Pharmacy dosing heparin   Heparin level just subtherapeutic at 0.28 after bolus and rate increase to 1250 units/hr. H/H, plt stable. Platelets have halved since admission but H/H was also more concentrated, more likely due to fluid shifts and blood draws. Timing is soon for HIT. Also with liver cirrhosis. No apparent hospitalizations within 3 mo PTA where heparin may have been given. Continue to monitor platelets.  Goal of Therapy:  Heparin level 0.3-0.7 units/ml Monitor platelets by anticoagulation protocol: Yes   Plan:  Increase heparin gtt to 1350 units/hr Monitor daily HL, CBC, plt Monitor for signs/symptoms of bleeding  F/u restart apixaban  Benetta Spar, PharmD, BCPS, [facility] Clinical Pharmacist  Please check AMION for all [place] phone numbers After 10:00 PM, call [place]

## 2019-05-21 NOTE — Progress Notes (Signed)
[facility]  PROGRESS NOTE  VADA YELLEN YTK:[id number] DOB: [birth year] DOA: 05/25/2019 PCP: Tonia Ghent, MD   Brief HPI:   79 year old male with medical history of aortic insufficiency, atrial fibrillation on Eliquis, hypertension, LBBB, PVCs, nonalcoholic liver cirrhosis, history of ascites s/p paracentesis last year came with complaints of generalized weakness.  Patient was evaluated in the ED on 113 for abdominal pain and emesis.  Labs were drawn but patient left before seen by provider.  Labs done which showed elevated lipase at 2000 541 and he was advised to try return to the ED.  Patient received his first dose of Copaxone on 113 and has not been feeling well since then.  In the ED was found to be tachycardic, heart rate in 120s, tachypneic, respiratory and 20s.  Hypotensive with blood pressure 85/67.  Chest x-ray showed mild bibasilar atelectasis or infiltrate with associated pleural effusion.  C abdomen showed advanced liver cirrhosis with moderate amount of ascites.  Peripancreatic fat concerning with low level of pancreatitis.  He received IV fluids, IV antibiotics.  CCM was consulted.  Patient admitted with sepsis secondary to SBP and colitis.  Was found to have decompensated liver failure and ascites.  He underwent paracentesis x2.  Also received IV albumin.  Also required thoracentesis.    Subjective   Patient seen and examined, denies shortness of breath.  Still requiring 3 L/min of oxygen via nasal cannula.   Assessment/Plan:     1. Sepsis-secondary to SBP/colitis.  Patient received 2.5 L of IV fluids, repeat hypotensive, lactic acid was 4.  Chest x-ray showed infiltrates.  CT abdomen pelvis showed possible colitis.  CCM was consulted.   C. difficile negative.  Patient underwent paracentesis, ascitic fluid analysis showed 80% neutrophils, total nucleated cells 1,365.  Continue ceftriaxone and Flagyl. 2. Acute hypoxic respiratory failure-likely from underlying  ascites, left pleural effusion.  Patient underwent both thoracentesis and paracentesis yesterday.  Also given albumin for SBP protocol.  Breathing has improved.  Repeat chest x-ray shows improvement.   Pulmonary following. 3. Acute pancreatitis-supportive care, has mild elevation of AST.  Lipase improved to 101.  Started on clear liquid diet.  CT abdomen shows some edema of the peripancreatic fat, likely low-level pancreatitis.  No advanced pancreatitis or pseudocyst. 4. Decompensated liver cirrhosis-patient underwent paracentesis, 2.6 L fluid removed on 05/15/2019.  Albumin 30 g every 6 hours for 4 doses, first day then 40 mg twice daily for 2 days.  Patient underwent paracentesis yesterday, 2.5 L of clear ascitic fluid was obtained. 5. Diarrhea-C. difficile was negative, diarrhea improved.  Continue IV Flagyl, ceftriaxone. 6. Acute kidney injury on CKD stage IIIa-Baseline creatinine 1.2, he presented with creatinine of 2.6.  Likely from hypovolemia, sepsis hypotension.  He received IV fluids.  Creatinine is trending down.  Today creatinine is 1.43.  Patient has chronically low bicarb, in the setting of nonanion gap metabolic acidosis.  Started on  bicarb supplementation 650 mg p.o. twice daily 7. Atrial fibrillation-Eliquis on hold, continue metoprolol 12.5 mg p.o. twice daily     SpO2: 93 % O2 Flow Rate (L/min): 3 L/min     Lab Results  Component Value Date   SARSCOV2NAA NEGATIVE 05/06/2019     CBG: No results for input(s): GLUCAP in the last 168 hours.  CBC: Recent Labs  Lab 05/17/19 0750 05/18/19 0656 05/19/19 0502 05/20/19 0247 05/21/19 0611  WBC 11.8* 12.1* 10.6* 8.6 11.6*  HGB 14.4 12.8* 12.8* 12.3* 13.2  HCT 42.1 37.9* 38.5* 36.5*  40.4  MCV 105.3* 105.9* 107.8* 106.7* 108.3*  PLT 159 131* 126* 112* 131*    Basic Metabolic Panel: Recent Labs  Lab 05/17/19 0750 05/18/19 0656 05/19/19 0502 05/20/19 0247 05/21/19 0611  NA 137 141 136 140 140  K 4.9 4.7 4.6 4.5 4.3   CL 109 108 103 108 105  CO2 19* 21* 20* 20* 25  GLUCOSE 91 83 84 98 132*  BUN 63* 66* 68* 67* 65*  CREATININE 1.56* 1.43* 1.53* 1.46* 1.39*  CALCIUM 8.0* 8.6* 9.3 9.7 9.9     Liver Function Tests: Recent Labs  Lab 05/17/19 0750 05/18/19 0656 05/19/19 0502 05/20/19 0247 05/21/19 0611  AST 50* 44* 28 26 25   ALT 30 25 20 17 16   ALKPHOS 51 41 38 32* 29*  BILITOT 2.3* 1.9* 1.8* 1.5* 1.2  PROT 5.8* 6.0* 6.2* 6.1* 6.1*  ALBUMIN 2.8* 3.8 4.3 4.6 4.8        DVT prophylaxis: Heparin  Code Status: Full code  Family Communication: No family at bedside  Disposition Plan: likely home when medically ready for discharge         Scheduled medications:  . calcium citrate  200 mg of elemental calcium Oral BID  . ipratropium-albuterol  3 mL Nebulization TID  . metoprolol succinate  12.5 mg Oral BID  . sodium bicarbonate  650 mg Oral BID    Consultants:  PCCM  Procedures:  Thoracentesis  Paracentesis  Antibiotics:   Anti-infectives (From admission, onward)   Start     Dose/Rate Route Frequency Ordered Stop   05/17/19 1000  cefTRIAXone (ROCEPHIN) 2 g in sodium chloride 0.9 % 100 mL IVPB     2 g 200 mL/hr over 30 Minutes Intravenous Every 24 hours 05/17/19 0919     05/16/19 1330  ceFEPIme (MAXIPIME) 2 g in sodium chloride 0.9 % 100 mL IVPB  Status:  Discontinued     2 g 200 mL/hr over 30 Minutes Intravenous Every 12 hours 05/16/19 1247 05/17/19 0919   05/15/19 1800  vancomycin (VANCOCIN) IVPB 1000 mg/200 mL premix  Status:  Discontinued     1,000 mg 200 mL/hr over 60 Minutes Intravenous  Once 05/15/19 0814 05/15/19 1044   05/15/19 1400  ceFEPIme (MAXIPIME) 2 g in sodium chloride 0.9 % 100 mL IVPB  Status:  Discontinued     2 g 200 mL/hr over 30 Minutes Intravenous Every 24 hours 05/10/2019 1501 05/16/19 1247   05/23/2019 2145  metroNIDAZOLE (FLAGYL) IVPB 500 mg     500 mg 100 mL/hr over 60 Minutes Intravenous Every 8 hours 05/01/2019 2137     05/23/2019 1500   vancomycin variable dose per unstable renal function (pharmacist dosing)  Status:  Discontinued      Does not apply See admin instructions 05/25/2019 1501 05/15/19 1044   05/11/2019 1400  ceFEPIme (MAXIPIME) 2 g in sodium chloride 0.9 % 100 mL IVPB     2 g 200 mL/hr over 30 Minutes Intravenous  Once 05/04/2019 1355 05/20/2019 1448   05/11/2019 1400  metroNIDAZOLE (FLAGYL) IVPB 500 mg     500 mg 100 mL/hr over 60 Minutes Intravenous  Once 05/04/2019 1355 05/08/2019 1445   05/10/2019 1400  vancomycin (VANCOCIN) IVPB 1000 mg/200 mL premix  Status:  Discontinued     1,000 mg 200 mL/hr over 60 Minutes Intravenous  Once 05/13/2019 1355 05/25/2019 1356   05/09/2019 1400  vancomycin (VANCOREADY) IVPB 1750 mg/350 mL     1,750 mg 175 mL/hr over 120 Minutes Intravenous  Once 05/26/2019 1356 05/07/2019 1923       Objective   Vitals:   05/21/19 0400 05/21/19 0800 05/21/19 0802 05/21/19 1200  BP: 124/74 113/89  103/71  Pulse: (!) 117 90  (!) 111  Resp: 19 20  18   Temp: 97.8 F (36.6 C) 97.6 F (36.4 C)  (!) 97.2 F (36.2 C)  TempSrc: Oral Oral  Oral  SpO2: 95% 94% 95% 93%  Weight:      Height:        Intake/Output Summary (Last 24 hours) at 05/21/2019 1522 Last data filed at 05/21/2019 0900 Gross per 24 hour  Intake 742 ml  Output 600 ml  Net 142 ml    01/20 1901 - 01/22 0700 In: 4765 [P.O.:250; I.V.:178] Out: 800 [Urine:800]  Filed Weights   05/25/2019 1336 05/23/2019 2143 05/19/19 0400  Weight: 85.8 kg 87.4 kg 90.5 kg    Physical Examination:  General-appears in no acute distress Heart-S1-S2, regular, no murmur auscultated Lungs-clear to auscultation bilaterally, no wheezing or crackles auscultated Abdomen-soft, nontender, distended, no organomegaly Extremities-no edema in the lower extremities Neuro-alert, oriented x3, no focal deficit noted   Data Reviewed:   Recent Results (from the past 240 hour(s))  Blood Culture (routine x 2)     Status: None   Collection Time: 05/06/2019  2:10 PM    Specimen: BLOOD  Result Value Ref Range Status   Specimen Description BLOOD RIGHT ANTECUBITAL  Final   Special Requests   Final    BOTTLES DRAWN AEROBIC ONLY Blood Culture results may not be optimal due to an inadequate volume of blood received in culture bottles   Culture   Final    NO GROWTH 5 DAYS Performed at [facility], [place] [address], [place], [place] [address]    Report Status 05/19/2019 FINAL  Final  Blood Culture (routine x 2)     Status: None   Collection Time: 05/02/2019  2:10 PM   Specimen: BLOOD  Result Value Ref Range Status   Specimen Description BLOOD LEFT ANTECUBITAL  Final   Special Requests   Final    BOTTLES DRAWN AEROBIC AND ANAEROBIC Blood Culture adequate volume   Culture   Final    NO GROWTH 5 DAYS Performed at [facility], [place] [address] [place], [place] [address]    Report Status 05/19/2019 FINAL  Final  Respiratory Panel by RT PCR (Flu A&B, Covid) - Nasopharyngeal Swab     Status: None   Collection Time: 05/27/2019  2:20 PM   Specimen: Nasopharyngeal Swab  Result Value Ref Range Status   SARS Coronavirus 2 by RT PCR NEGATIVE NEGATIVE Final    Comment: (NOTE) SARS-CoV-2 target nucleic acids are NOT DETECTED. The SARS-CoV-2 RNA is generally detectable in upper respiratoy specimens during the acute phase of infection. The lowest concentration of SARS-CoV-2 viral copies this assay can detect is 131 copies/mL. A negative result does not preclude SARS-Cov-2 infection and should not be used as the sole basis for treatment or other patient management decisions. A negative result may occur with  improper specimen collection/handling, submission of specimen other than nasopharyngeal swab, presence of viral mutation(s) within the areas targeted by this assay, and inadequate number of viral copies (<131 copies/mL). A negative result must be combined with clinical observations, patient history, and epidemiological information. The expected result is  Negative. Fact Sheet for Patients:  PinkCheek.be Fact Sheet for Healthcare Providers:  GravelBags.it This test is not yet ap proved or cleared  by the [place] and  has been authorized for detection and/or diagnosis of SARS-CoV-2 by FDA under an Emergency Use Authorization (EUA). This EUA will remain  in effect (meaning this test can be used) for the duration of the COVID-19 declaration under Section 564(b)(1) of the Act, 21 U.S.C. section 360bbb-3(b)(1), unless the authorization is terminated or revoked sooner.    Influenza A by PCR NEGATIVE NEGATIVE Final   Influenza B by PCR NEGATIVE NEGATIVE Final    Comment: (NOTE) The Xpert Xpress SARS-CoV-2/FLU/RSV assay is intended as an aid in  the diagnosis of influenza from Nasopharyngeal swab specimens and  should not be used as a sole basis for treatment. Nasal washings and  aspirates are unacceptable for Xpert Xpress SARS-CoV-2/FLU/RSV  testing. Fact Sheet for Patients: PinkCheek.be Fact Sheet for Healthcare Providers: GravelBags.it This test is not yet approved or cleared by the [place] FDA and  has been authorized for detection and/or diagnosis of SARS-CoV-2 by  FDA under an Emergency Use Authorization (EUA). This EUA will remain  in effect (meaning this test can be used) for the duration of the  Covid-19 declaration under Section 564(b)(1) of the Act, 21  U.S.C. section 360bbb-3(b)(1), unless the authorization is  terminated or revoked. Performed at [facility], [place] [address] [place], [place] [address]   C difficile quick scan w PCR reflex     Status: None   Collection Time: 05/15/19  1:35 AM   Specimen: STOOL  Result Value Ref Range Status   C Diff antigen NEGATIVE NEGATIVE Final   C Diff toxin NEGATIVE NEGATIVE Final   C Diff interpretation No C. difficile detected.  Final    Comment:  Performed at [facility], [place] [address], [place], [place] [address]  GI pathogen panel by PCR, stool     Status: None   Collection Time: 05/15/19  1:35 AM   Specimen: Stool  Result Value Ref Range Status   Plesiomonas shigelloides NOT DETECTED NOT DETECTED Final   Yersinia enterocolitica NOT DETECTED NOT DETECTED Final   Vibrio NOT DETECTED NOT DETECTED Final   Enteropathogenic E coli NOT DETECTED NOT DETECTED Final   E coli (ETEC) LT/ST NOT DETECTED NOT DETECTED Final   E coli 4650 by PCR Not applicable NOT DETECTED Final   Cryptosporidium by PCR NOT DETECTED NOT DETECTED Final   Entamoeba histolytica NOT DETECTED NOT DETECTED Final   Adenovirus F 40/41 NOT DETECTED NOT DETECTED Final   Norovirus GI/GII NOT DETECTED NOT DETECTED Final   Sapovirus NOT DETECTED NOT DETECTED Final    Comment: (NOTE) Performed At: [facility] [place], [place] [id number] Rush Farmer MD XN:[id number]    Vibrio cholerae NOT DETECTED NOT DETECTED Final   Campylobacter by PCR NOT DETECTED NOT DETECTED Final   Salmonella by PCR NOT DETECTED NOT DETECTED Final   E coli (STEC) NOT DETECTED NOT DETECTED Final   Enteroaggregative E coli NOT DETECTED NOT DETECTED Final   Shigella by PCR NOT DETECTED NOT DETECTED Final   Cyclospora cayetanensis NOT DETECTED NOT DETECTED Final   Astrovirus NOT DETECTED NOT DETECTED Final   G lamblia by PCR NOT DETECTED NOT DETECTED Final   Rotavirus A by PCR NOT DETECTED NOT DETECTED Final  Urine culture     Status: None   Collection Time: 05/15/19  9:10 AM   Specimen: In/Out Cath Urine  Result Value Ref Range Status   Specimen Description IN/OUT CATH URINE  Final  Special Requests NONE  Final   Culture   Final    NO GROWTH Performed at [facility], [place] [address], [place], [place] [address]    Report Status 05/16/2019 FINAL  Final  Body fluid culture     Status: None   Collection Time: 05/16/19  3:12 PM   Specimen: Peritoneal  [place]; Body Fluid  Result Value Ref Range Status   Specimen Description PERITONEAL  Final   Special Requests Normal  Final   Gram Stain   Final    FEW WBC PRESENT,BOTH PMN AND MONONUCLEAR NO ORGANISMS SEEN    Culture   Final    NO GROWTH 3 DAYS Performed at [facility], [address] [place], [place] [address]    Report Status 05/19/2019 FINAL  Final  Body fluid culture     Status: None (Preliminary result)   Collection Time: 05/18/19  5:29 PM   Specimen: Body Fluid  Result Value Ref Range Status   Specimen Description FLUID PARACENTESIS  Final   Special Requests NONE  Final   Gram Stain   Final    FEW WBC PRESENT, PREDOMINANTLY PMN NO ORGANISMS SEEN    Culture   Final    NO GROWTH 3 DAYS Performed at [facility], [address], [place], [place] [address]    Report Status PENDING  Incomplete  Body fluid culture     Status: None   Collection Time: 05/18/19  5:31 PM   Specimen: Body Fluid  Result Value Ref Range Status   Specimen Description FLUID LEFT PLEURAL  Final   Special Requests NONE  Final   Gram Stain   Final    FEW WBC PRESENT,BOTH PMN AND MONONUCLEAR NO ORGANISMS SEEN    Culture   Final    NO GROWTH Performed at [facility], [address] [place],  [address]    Report Status 05/21/2019 FINAL  Final    No results for input(s): LIPASE, AMYLASE in the last 168 hours. Recent Labs  Lab 05/18/19 0943  AMMONIA 25    Cardiac Enzymes: No results for input(s): CKTOTAL, CKMB, CKMBINDEX, TROPONINI in the last 168 hours. BNP (last 3 results) Recent Labs    05/28/18 1115  BNP 73.5    ProBNP (last 3 results) Recent Labs    06/05/18 1356 06/11/18 1644  PROBNP 116.0* 81.0    Studies:  No results found.   Admission status: Inpatient: Based on patients clinical presentation and evaluation of above clinical data, I have made determination that patient meets Inpatient criteria at this time.   Oswald Hillock   [facility] If 7PM-7AM, please contact night-coverage at [email or web address], Office  [phone]  password [email or web address]  05/21/2019, 3:22 PM  LOS: 7 days

## 2019-05-21 NOTE — Progress Notes (Signed)
Inpatient Rehabilitation Admissions Coordinator  Inpatient rehab consult received. I met with patient at bedside for rehab assessment and to discuss goals and expectations of an inpt rehab admit. He lives alone and was independent up until 1 week pta. With his permission, I contacted his daughter, April by phone to discuss the same. I have encouraged April to contact her Dad to discuss over the weekend long term plans with caregiver support and their preference for rehab venue. I will follow up on Monday.  Danne Baxter, RN, MSN Rehab Admissions Coordinator [phone] 05/21/2019 1:17 PM

## 2019-05-22 LAB — COMPREHENSIVE METABOLIC PANEL
ALT: 15 U/L (ref 0–44)
AST: 26 U/L (ref 15–41)
Albumin: 4.2 g/dL (ref 3.5–5.0)
Alkaline Phosphatase: 32 U/L — ABNORMAL LOW (ref 38–126)
Anion gap: 11 (ref 5–15)
BUN: 64 mg/dL — ABNORMAL HIGH (ref 8–23)
CO2: 20 mmol/L — ABNORMAL LOW (ref 22–32)
Calcium: 10.2 mg/dL (ref 8.9–10.3)
Chloride: 108 mmol/L (ref 98–111)
Creatinine, Ser: 1.26 mg/dL — ABNORMAL HIGH (ref 0.61–1.24)
GFR calc Af Amer: >60 mL/min (ref >60)
GFR calc non Af Amer: 54 mL/min — ABNORMAL LOW (ref >60)
Glucose, Bld: 132 mg/dL — ABNORMAL HIGH (ref 70–99)
Potassium: 4.6 mmol/L (ref 3.5–5.1)
Sodium: 139 mmol/L (ref 135–145)
Total Bilirubin: 1 mg/dL (ref 0.3–1.2)
Total Protein: 5.6 g/dL — ABNORMAL LOW (ref 6.5–8.1)

## 2019-05-22 LAB — BODY FLUID CULTURE: Culture: NO GROWTH

## 2019-05-22 LAB — CBC
HCT: 40.7 % (ref 39.0–52.0)
Hemoglobin: 13.5 g/dL (ref 13.0–17.0)
MCH: 36.1 pg — ABNORMAL HIGH (ref 26.0–34.0)
MCHC: 33.2 g/dL (ref 30.0–36.0)
MCV: 108.8 fL — ABNORMAL HIGH (ref 80.0–100.0)
Platelets: 122 10*3/uL — ABNORMAL LOW (ref 150–400)
RBC: 3.74 MIL/uL — ABNORMAL LOW (ref 4.22–5.81)
RDW: 14.6 % (ref 11.5–15.5)
WBC: 14 10*3/uL — ABNORMAL HIGH (ref 4.0–10.5)
nRBC: 0.1 % (ref 0.0–0.2)

## 2019-05-22 LAB — HEPARIN LEVEL (UNFRACTIONATED)
Heparin Unfractionated: 0.28 IU/mL — ABNORMAL LOW (ref 0.30–0.70)
Heparin Unfractionated: 0.58 IU/mL (ref 0.30–0.70)
Heparin Unfractionated: 0.62 IU/mL (ref 0.30–0.70)

## 2019-05-22 MED ORDER — ALPRAZOLAM 0.25 MG PO TABS
0.2500 mg | ORAL_TABLET | Freq: Once | ORAL | Status: AC
  Administered 2019-05-22: 02:00:00 0.25 mg via ORAL
  Filled 2019-05-22: qty 1

## 2019-05-22 MED ORDER — ALPRAZOLAM 0.25 MG PO TABS
0.2500 mg | ORAL_TABLET | Freq: Once | ORAL | Status: AC
  Administered 2019-05-22: 21:00:00 0.25 mg via ORAL
  Filled 2019-05-22: qty 1

## 2019-05-22 MED ORDER — FUROSEMIDE 20 MG PO TABS
20.0000 mg | ORAL_TABLET | Freq: Two times a day (BID) | ORAL | Status: DC
  Administered 2019-05-22 (×2): 20 mg via ORAL
  Filled 2019-05-22 (×2): qty 1

## 2019-05-22 MED ORDER — SPIRONOLACTONE 25 MG PO TABS
25.0000 mg | ORAL_TABLET | Freq: Every day | ORAL | Status: DC
  Administered 2019-05-22: 25 mg via ORAL
  Filled 2019-05-22: qty 1

## 2019-05-22 NOTE — Progress Notes (Signed)
ANTICOAGULATION CONSULT NOTE   Pharmacy Consult for Heparin Indication: atrial fibrillation  Patient Measurements: Height: 6' 1"  (185.4 cm) Weight: 199 lb 8.3 oz (90.5 kg) IBW/kg (Calculated) : 79.9 Heparin Dosing Weight: 87kg  Vital Signs: Temp: 98.4 F (36.9 C) (01/23 0858) Temp Source: Oral (01/23 0858) BP: 105/79 (01/23 0858) Pulse Rate: 128 (01/23 0922)  Labs: Recent Labs    05/20/19 0247 05/20/19 1251 05/21/19 0611 05/21/19 1641 05/22/19 0243  HGB 12.3*  --  13.2  --  13.5  HCT 36.5*  --  40.4  --  40.7  PLT 112*  --  131*  --  122*  HEPARINUNFRC  --    < > 0.15* 0.28* 0.28*  CREATININE 1.46*  --  1.39*  --  1.26*   < > = values in this interval not displayed.    Estimated Creatinine Clearance: 54.6 mL/min (A) (by C-G formula based on SCr of 1.26 mg/dL (H)).  Assessment: 79 year old male on apixaban PTA for afib (CHADS2VASc = 4) with SPB on antibiotics. He is s/p paracentesis and apixaban is on hold (last dose 1/15). Pharmacy dosing heparin.   Heparin level this morning is therapuetic at 0.58 after a rate increase to 1500 units/hour. H&H is stable at 13.5/40.7, plts are low at 122.  Goal of Therapy:  Heparin level 0.3-0.7 units/ml Monitor platelets by anticoagulation protocol: Yes   Plan:  Continue heparin 1500 units/hr 8 hour confirmatory heparin level  Monitor daily HL, CBC, plt Monitor for signs/symptoms of bleeding  F/u restart apixaban    Thank you,   Eddie Candle, PharmD PGY-1 Pharmacy Resident   Please check amion for clinical pharmacist contact number

## 2019-05-22 NOTE — Progress Notes (Signed)
ANTICOAGULATION CONSULT NOTE - Follow Up Consult  Pharmacy Consult for heparin Indication: atrial fibrillation  Labs: Recent Labs    05/19/19 0502 05/19/19 0502 05/20/19 0247 05/20/19 1251 05/21/19 0611 05/21/19 1641 05/22/19 0243  HGB 12.8*   < > 12.3*  --  13.2  --  13.5  HCT 38.5*   < > 36.5*  --  40.4  --  40.7  PLT 126*   < > 112*  --  131*  --  122*  HEPARINUNFRC 0.27*  --   --    < > 0.15* 0.28* 0.28*  CREATININE 1.53*  --  1.46*  --  1.39*  --   --    < > = values in this interval not displayed.    Assessment: 79yo male remains subtherapeutic on heparin with no change in level despite rate increase; no gtt issues or signs of bleeding per RN.  Goal of Therapy:  Heparin level 0.3-0.7 units/ml   Plan:  Will increase heparin gtt by 1-2 units/kg/hr to 1500 units/hr and check level in 8 hours.    Wynona Neat, PharmD, BCPS  05/22/2019,3:48 AM

## 2019-05-22 NOTE — Progress Notes (Addendum)
[facility]  PROGRESS NOTE  Richard Saunders EPP:[id number] DOB: 07-Oct-1940 DOA: 05/19/2019 PCP: Tonia Ghent, MD   Brief HPI:   79 year old male with medical history of aortic insufficiency, atrial fibrillation on Eliquis, hypertension, LBBB, PVCs, nonalcoholic liver cirrhosis, history of ascites s/p paracentesis last year came with complaints of generalized weakness.  Patient was evaluated in the ED on 113 for abdominal pain and emesis.  Labs were drawn but patient left before seen by provider.  Labs done which showed elevated lipase at 2000 541 and he was advised to try return to the ED.  Patient received his first dose of Copaxone on 113 and has not been feeling well since then.  In the ED was found to be tachycardic, heart rate in 120s, tachypneic, respiratory and 20s.  Hypotensive with blood pressure 85/67.  Chest x-ray showed mild bibasilar atelectasis or infiltrate with associated pleural effusion.  C abdomen showed advanced liver cirrhosis with moderate amount of ascites.  Peripancreatic fat concerning with low level of pancreatitis.  He received IV fluids, IV antibiotics.  CCM was consulted.  Patient admitted with sepsis secondary to SBP and colitis.  Was found to have decompensated liver failure and ascites.  He underwent paracentesis x2.  Also received IV albumin.  Also required thoracentesis.    Subjective   Patient seen and examined, breathing is improved.  Sitting in chair.   Assessment/Plan:     1. Sepsis-secondary to SBP/colitis.  Patient received 2.5 L of IV fluids, repeat hypotensive, lactic acid was 4.  Chest x-ray showed infiltrates.  CT abdomen pelvis showed possible colitis.  CCM was consulted.   C. difficile negative.  Patient underwent paracentesis, ascitic fluid analysis showed 80% neutrophils, total nucleated cells 1,365.  Continue ceftriaxone and Flagyl, will complete 10 days of therapy.. 2. Acute hypoxic respiratory failure-likely from underlying ascites,  left pleural effusion.  Patient underwent both thoracentesis and paracentesis yesterday.  Also given albumin for SBP protocol.  Breathing has improved.  Repeat chest x-ray shows improvement.   Pulmonary following. 3. Acute pancreatitis-supportive care, has mild elevation of AST.  Lipase improved to 101.  Started on clear liquid diet.  CT abdomen shows some edema of the peripancreatic fat, likely low-level pancreatitis.  No advanced pancreatitis or pseudocyst. 4. Decompensated liver cirrhosis-patient underwent paracentesis, 2.6 L fluid removed on 05/15/2019.  Albumin 30 g every 6 hours for 4 doses, first day then 40 mg twice daily for 2 days.  Patient underwent paracentesis on 05/18/2019, 2.5 L of clear ascitic fluid was obtained.  Will start Lasix 20 mg p.o. twice daily, Aldactone 25 mg daily. 5. Diarrhea-C. difficile was negative, diarrhea improved.  Continue IV Flagyl, ceftriaxone. 6. Acute kidney injury on CKD stage IIIa-Baseline creatinine 1.2, he presented with creatinine of 2.6.  Likely from hypovolemia, sepsis hypotension.  He received IV fluids.  Creatinine is trending down.  Today creatinine is 1.43.  Patient has chronically low bicarb, in the setting of nonanion gap metabolic acidosis.  Started on  bicarb supplementation 650 mg p.o. twice daily 7. Atrial fibrillation-Eliquis on hold due to multiple procedures as above, continue metoprolol 12.5 mg p.o. twice daily.  Will restart Eliquis in next 2 to 3 days if patient does not requiring any further procedures.  Patient is currently on heparin per pharmacy.     SpO2: 99 % O2 Flow Rate (L/min): 3 L/min     Lab Results  Component Value Date   SARSCOV2NAA NEGATIVE 05/29/2019     CBG: No  results for input(s): GLUCAP in the last 168 hours.  CBC: Recent Labs  Lab 05/18/19 0656 05/19/19 0502 05/20/19 0247 05/21/19 0611 05/22/19 0243  WBC 12.1* 10.6* 8.6 11.6* 14.0*  HGB 12.8* 12.8* 12.3* 13.2 13.5  HCT 37.9* 38.5* 36.5* 40.4 40.7  MCV  105.9* 107.8* 106.7* 108.3* 108.8*  PLT 131* 126* 112* 131* 122*    Basic Metabolic Panel: Recent Labs  Lab 05/18/19 0656 05/19/19 0502 05/20/19 0247 05/21/19 0611 05/22/19 0243  NA 141 136 140 140 139  K 4.7 4.6 4.5 4.3 4.6  CL 108 103 108 105 108  CO2 21* 20* 20* 25 20*  GLUCOSE 83 84 98 132* 132*  BUN 66* 68* 67* 65* 64*  CREATININE 1.43* 1.53* 1.46* 1.39* 1.26*  CALCIUM 8.6* 9.3 9.7 9.9 10.2     Liver Function Tests: Recent Labs  Lab 05/18/19 0656 05/19/19 0502 05/20/19 0247 05/21/19 0611 05/22/19 0243  AST 44* 28 26 25 26   ALT 25 20 17 16 15   ALKPHOS 41 38 32* 29* 32*  BILITOT 1.9* 1.8* 1.5* 1.2 1.0  PROT 6.0* 6.2* 6.1* 6.1* 5.6*  ALBUMIN 3.8 4.3 4.6 4.8 4.2        DVT prophylaxis: Heparin  Code Status: Full code  Family Communication: No family at bedside  Disposition Plan: likely home when medically ready for discharge         Scheduled medications:  . calcium citrate  200 mg of elemental calcium Oral BID  . furosemide  20 mg Oral BID  . ipratropium-albuterol  3 mL Nebulization TID  . metoprolol succinate  12.5 mg Oral BID  . sodium bicarbonate  650 mg Oral BID  . spironolactone  25 mg Oral Daily    Consultants:  PCCM  Procedures:  Thoracentesis  Paracentesis  Antibiotics:   Anti-infectives (From admission, onward)   Start     Dose/Rate Route Frequency Ordered Stop   05/17/19 1000  cefTRIAXone (ROCEPHIN) 2 g in sodium chloride 0.9 % 100 mL IVPB     2 g 200 mL/hr over 30 Minutes Intravenous Every 24 hours 05/17/19 0919 05/27/19 0959   05/16/19 1330  ceFEPIme (MAXIPIME) 2 g in sodium chloride 0.9 % 100 mL IVPB  Status:  Discontinued     2 g 200 mL/hr over 30 Minutes Intravenous Every 12 hours 05/16/19 1247 05/17/19 0919   05/15/19 1800  vancomycin (VANCOCIN) IVPB 1000 mg/200 mL premix  Status:  Discontinued     1,000 mg 200 mL/hr over 60 Minutes Intravenous  Once 05/15/19 0814 05/15/19 1044   05/15/19 1400  ceFEPIme  (MAXIPIME) 2 g in sodium chloride 0.9 % 100 mL IVPB  Status:  Discontinued     2 g 200 mL/hr over 30 Minutes Intravenous Every 24 hours 05/13/2019 1501 05/16/19 1247   04/30/2019 2145  metroNIDAZOLE (FLAGYL) IVPB 500 mg     500 mg 100 mL/hr over 60 Minutes Intravenous Every 8 hours 05/25/2019 2137 05/21/2019 2144   05/22/2019 1500  vancomycin variable dose per unstable renal function (pharmacist dosing)  Status:  Discontinued      Does not apply See admin instructions 05/08/2019 1501 05/15/19 1044   05/23/2019 1400  ceFEPIme (MAXIPIME) 2 g in sodium chloride 0.9 % 100 mL IVPB     2 g 200 mL/hr over 30 Minutes Intravenous  Once 05/07/2019 1355 04/30/2019 1448   05/29/2019 1400  metroNIDAZOLE (FLAGYL) IVPB 500 mg     500 mg 100 mL/hr over 60 Minutes Intravenous  Once  05/06/2019 1355 05/10/2019 1445   05/10/2019 1400  vancomycin (VANCOCIN) IVPB 1000 mg/200 mL premix  Status:  Discontinued     1,000 mg 200 mL/hr over 60 Minutes Intravenous  Once 05/10/2019 1355 05/11/2019 1356   05/22/2019 1400  vancomycin (VANCOREADY) IVPB 1750 mg/350 mL     1,750 mg 175 mL/hr over 120 Minutes Intravenous  Once 05/20/2019 1356 05/06/2019 1923       Objective   Vitals:   05/22/19 0808 05/22/19 0858 05/22/19 0922 05/22/19 1315  BP:  105/79    Pulse:  (!) 126 (!) 128   Resp:  20    Temp:  98.4 F (36.9 C)    TempSrc:  Oral    SpO2: 97% 95%  99%  Weight:      Height:        Intake/Output Summary (Last 24 hours) at 05/22/2019 1330 Last data filed at 05/22/2019 0514 Gross per 24 hour  Intake 686.88 ml  Output 200 ml  Net 486.88 ml    01/21 1901 - 01/23 0700 In: 1428.9 [P.O.:600; I.V.:428.9] Out: 650 [Urine:650]  Filed Weights   05/05/2019 1336 05/21/2019 2143 05/19/19 0400  Weight: 85.8 kg 87.4 kg 90.5 kg    Physical Examination:  General-appears in no acute distress Heart-S1-S2, regular, no murmur auscultated Lungs-bibasilar crackles right more than left Abdomen-soft, nontender, distended, no organomegaly Extremities-no  edema in the lower extremities Neuro-alert, oriented x3, no focal deficit noted   Data Reviewed:   Recent Results (from the past 240 hour(s))  Blood Culture (routine x 2)     Status: None   Collection Time: 05/08/2019  2:10 PM   Specimen: BLOOD  Result Value Ref Range Status   Specimen Description BLOOD RIGHT ANTECUBITAL  Final   Special Requests   Final    BOTTLES DRAWN AEROBIC ONLY Blood Culture results may not be optimal due to an inadequate volume of blood received in culture bottles   Culture   Final    NO GROWTH 5 DAYS Performed at [facility], [place] [address], [place], [place] [address]    Report Status 05/19/2019 FINAL  Final  Blood Culture (routine x 2)     Status: None   Collection Time: 05/04/2019  2:10 PM   Specimen: BLOOD  Result Value Ref Range Status   Specimen Description BLOOD LEFT ANTECUBITAL  Final   Special Requests   Final    BOTTLES DRAWN AEROBIC AND ANAEROBIC Blood Culture adequate volume   Culture   Final    NO GROWTH 5 DAYS Performed at [facility], [place] [address], [place], [place] [address]    Report Status 05/19/2019 FINAL  Final  Respiratory Panel by RT PCR (Flu A&B, Covid) - Nasopharyngeal Swab     Status: None   Collection Time: 05/11/2019  2:20 PM   Specimen: Nasopharyngeal Swab  Result Value Ref Range Status   SARS Coronavirus 2 by RT PCR NEGATIVE NEGATIVE Final    Comment: (NOTE) SARS-CoV-2 target nucleic acids are NOT DETECTED. The SARS-CoV-2 RNA is generally detectable in upper respiratoy specimens during the acute phase of infection. The lowest concentration of SARS-CoV-2 viral copies this assay can detect is 131 copies/mL. A negative result does not preclude SARS-Cov-2 infection and should not be used as the sole basis for treatment or other patient management decisions. A negative result may occur with  improper specimen collection/handling, submission of specimen other than nasopharyngeal swab, presence of viral mutation(s)  within the areas targeted  by this assay, and inadequate number of viral copies (<131 copies/mL). A negative result must be combined with clinical observations, patient history, and epidemiological information. The expected result is Negative. Fact Sheet for Patients:  PinkCheek.be Fact Sheet for Healthcare Providers:  GravelBags.it This test is not yet ap proved or cleared by the [place] FDA and  has been authorized for detection and/or diagnosis of SARS-CoV-2 by FDA under an Emergency Use Authorization (EUA). This EUA will remain  in effect (meaning this test can be used) for the duration of the COVID-19 declaration under Section 564(b)(1) of the Act, 21 U.S.C. section 360bbb-3(b)(1), unless the authorization is terminated or revoked sooner.    Influenza A by PCR NEGATIVE NEGATIVE Final   Influenza B by PCR NEGATIVE NEGATIVE Final    Comment: (NOTE) The Xpert Xpress SARS-CoV-2/FLU/RSV assay is intended as an aid in  the diagnosis of influenza from Nasopharyngeal swab specimens and  should not be used as a sole basis for treatment. Nasal washings and  aspirates are unacceptable for Xpert Xpress SARS-CoV-2/FLU/RSV  testing. Fact Sheet for Patients: PinkCheek.be Fact Sheet for Healthcare Providers: GravelBags.it This test is not yet approved or cleared by the [place] FDA and  has been authorized for detection and/or diagnosis of SARS-CoV-2 by  FDA under an Emergency Use Authorization (EUA). This EUA will remain  in effect (meaning this test can be used) for the duration of the  Covid-19 declaration under Section 564(b)(1) of the Act, 21  U.S.C. section 360bbb-3(b)(1), unless the authorization is  terminated or revoked. Performed at [facility], [place] [address], [place], [place] [address]   C difficile quick scan w PCR reflex     Status: None    Collection Time: 05/15/19  1:35 AM   Specimen: STOOL  Result Value Ref Range Status   C Diff antigen NEGATIVE NEGATIVE Final   C Diff toxin NEGATIVE NEGATIVE Final   C Diff interpretation No C. difficile detected.  Final    Comment: Performed at [facility], [place] [address] [place], [place] [address]  GI pathogen panel by PCR, stool     Status: None   Collection Time: 05/15/19  1:35 AM   Specimen: Stool  Result Value Ref Range Status   Plesiomonas shigelloides NOT DETECTED NOT DETECTED Final   Yersinia enterocolitica NOT DETECTED NOT DETECTED Final   Vibrio NOT DETECTED NOT DETECTED Final   Enteropathogenic E coli NOT DETECTED NOT DETECTED Final   E coli (ETEC) LT/ST NOT DETECTED NOT DETECTED Final   E coli 3846 by PCR Not applicable NOT DETECTED Final   Cryptosporidium by PCR NOT DETECTED NOT DETECTED Final   Entamoeba histolytica NOT DETECTED NOT DETECTED Final   Adenovirus F 40/41 NOT DETECTED NOT DETECTED Final   Norovirus GI/GII NOT DETECTED NOT DETECTED Final   Sapovirus NOT DETECTED NOT DETECTED Final    Comment: (NOTE) Performed At: [facility] [place], [place] [id number] Rush Farmer MD [id number]    Vibrio cholerae NOT DETECTED NOT DETECTED Final   Campylobacter by PCR NOT DETECTED NOT DETECTED Final   Salmonella by PCR NOT DETECTED NOT DETECTED Final   E coli (STEC) NOT DETECTED NOT DETECTED Final   Enteroaggregative E coli NOT DETECTED NOT DETECTED Final   Shigella by PCR NOT DETECTED NOT DETECTED Final   Cyclospora cayetanensis NOT DETECTED NOT DETECTED Final   Astrovirus NOT DETECTED NOT DETECTED Final   G lamblia by PCR NOT DETECTED NOT  DETECTED Final   Rotavirus A by PCR NOT DETECTED NOT DETECTED Final  Urine culture     Status: None   Collection Time: 05/15/19  9:10 AM   Specimen: In/Out Cath Urine  Result Value Ref Range Status   Specimen Description IN/OUT CATH URINE  Final   Special Requests NONE  Final   Culture    Final    NO GROWTH Performed at [facility], [place] [address], [place], [place] [address]    Report Status 05/16/2019 FINAL  Final  Body fluid culture     Status: None   Collection Time: 05/16/19  3:12 PM   Specimen: Peritoneal Washings; Body Fluid  Result Value Ref Range Status   Specimen Description PERITONEAL  Final   Special Requests Normal  Final   Gram Stain   Final    FEW WBC PRESENT,BOTH PMN AND MONONUCLEAR NO ORGANISMS SEEN    Culture   Final    NO GROWTH 3 DAYS Performed at [facility], [address], [place], [place] [address]    Report Status 05/19/2019 FINAL  Final  Body fluid culture     Status: None   Collection Time: 05/18/19  5:29 PM   Specimen: Body Fluid  Result Value Ref Range Status   Specimen Description FLUID PARACENTESIS  Final   Special Requests NONE  Final   Gram Stain   Final    FEW WBC PRESENT, PREDOMINANTLY PMN NO ORGANISMS SEEN    Culture   Final    NO GROWTH Performed at [facility], [address] [place], [place] [address]    Report Status 05/22/2019 FINAL  Final  Body fluid culture     Status: None   Collection Time: 05/18/19  5:31 PM   Specimen: Body Fluid  Result Value Ref Range Status   Specimen Description FLUID LEFT PLEURAL  Final   Special Requests NONE  Final   Gram Stain   Final    FEW WBC PRESENT,BOTH PMN AND MONONUCLEAR NO ORGANISMS SEEN    Culture   Final    NO GROWTH Performed at [facility], [address] [place], [place] [address]    Report Status 05/21/2019 FINAL  Final    No results for input(s): LIPASE, AMYLASE in the last 168 hours. Recent Labs  Lab 05/18/19 0943  AMMONIA 25    Cardiac Enzymes: No results for input(s): CKTOTAL, CKMB, CKMBINDEX, TROPONINI in the last 168 hours. BNP (last 3 results) Recent Labs    05/28/18 1115  BNP 73.5    ProBNP (last 3 results) Recent Labs    06/05/18 1356 06/11/18 1644  PROBNP 116.0* 81.0    Studies:  No results  found.   Admission status: Inpatient: Based on patients clinical presentation and evaluation of above clinical data, I have made determination that patient meets Inpatient criteria at this time.   Oswald Hillock   [facility] If 7PM-7AM, please contact night-coverage at [email or web address], Office  [phone]  password [email or web address]  05/22/2019, 1:30 PM  LOS: 8 days

## 2019-05-22 NOTE — Progress Notes (Signed)
ANTICOAGULATION CONSULT NOTE   Pharmacy Consult for Heparin Indication: atrial fibrillation  Patient Measurements: Height: 6' 1"  (185.4 cm) Weight: 199 lb 8.3 oz (90.5 kg) IBW/kg (Calculated) : 79.9 Heparin Dosing Weight: 87kg  Vital Signs: Temp: 98.4 F (36.9 C) (01/23 0858) Temp Source: Oral (01/23 0858) BP: 105/79 (01/23 0858) Pulse Rate: 128 (01/23 0922)  Labs: Recent Labs    05/20/19 0247 05/20/19 1251 05/21/19 0611 05/21/19 1641 05/22/19 0243 05/22/19 1107 05/22/19 1842  HGB 12.3*  --  13.2  --  13.5  --   --   HCT 36.5*  --  40.4  --  40.7  --   --   PLT 112*  --  131*  --  122*  --   --   HEPARINUNFRC  --    < > 0.15*   < > 0.28* 0.58 0.62  CREATININE 1.46*  --  1.39*  --  1.26*  --   --    < > = values in this interval not displayed.    Estimated Creatinine Clearance: 54.6 mL/min (A) (by C-G formula based on SCr of 1.26 mg/dL (H)).  Assessment: 78 year old male on apixaban PTA for afib (CHADS2VASc = 4) with SPB on antibiotics. He is s/p paracentesis and apixaban is on hold (last dose 1/15). Pharmacy dosing heparin.   Heparin level remains therapeutic at 0.62.  Goal of Therapy:  Heparin level 0.3-0.7 units/ml Monitor platelets by anticoagulation protocol: Yes   Plan:  Continue heparin at 1500 units/hr Monitor daily heparin level, CBC Monitor for signs/symptoms of bleeding  F/u restart apixaban  Vertis Kelch, PharmD, [facility] PGY2 Cardiology Pharmacy Resident Phone [phone] 05/22/2019       7:38 PM  Please check [email or web address] for unit-specific pharmacist phone numbers

## 2019-05-23 LAB — CBC
HCT: 40.3 % (ref 39.0–52.0)
Hemoglobin: 13.4 g/dL (ref 13.0–17.0)
MCH: 36 pg — ABNORMAL HIGH (ref 26.0–34.0)
MCHC: 33.3 g/dL (ref 30.0–36.0)
MCV: 108.3 fL — ABNORMAL HIGH (ref 80.0–100.0)
Platelets: 126 10*3/uL — ABNORMAL LOW (ref 150–400)
RBC: 3.72 MIL/uL — ABNORMAL LOW (ref 4.22–5.81)
RDW: 14.8 % (ref 11.5–15.5)
WBC: 16.8 10*3/uL — ABNORMAL HIGH (ref 4.0–10.5)
nRBC: 0 % (ref 0.0–0.2)

## 2019-05-23 LAB — COMPREHENSIVE METABOLIC PANEL
ALT: 15 U/L (ref 0–44)
AST: 24 U/L (ref 15–41)
Albumin: 3.8 g/dL (ref 3.5–5.0)
Alkaline Phosphatase: 34 U/L — ABNORMAL LOW (ref 38–126)
Anion gap: 9 (ref 5–15)
BUN: 66 mg/dL — ABNORMAL HIGH (ref 8–23)
CO2: 24 mmol/L (ref 22–32)
Calcium: 10.3 mg/dL (ref 8.9–10.3)
Chloride: 106 mmol/L (ref 98–111)
Creatinine, Ser: 1.46 mg/dL — ABNORMAL HIGH (ref 0.61–1.24)
GFR calc Af Amer: 53 mL/min — ABNORMAL LOW (ref >60)
GFR calc non Af Amer: 45 mL/min — ABNORMAL LOW (ref >60)
Glucose, Bld: 116 mg/dL — ABNORMAL HIGH (ref 70–99)
Potassium: 4.8 mmol/L (ref 3.5–5.1)
Sodium: 139 mmol/L (ref 135–145)
Total Bilirubin: 1.3 mg/dL — ABNORMAL HIGH (ref 0.3–1.2)
Total Protein: 5.5 g/dL — ABNORMAL LOW (ref 6.5–8.1)

## 2019-05-23 LAB — HEPARIN LEVEL (UNFRACTIONATED): Heparin Unfractionated: 0.61 IU/mL (ref 0.30–0.70)

## 2019-05-23 MED ORDER — IPRATROPIUM BROMIDE 0.02 % IN SOLN
0.5000 mg | Freq: Four times a day (QID) | RESPIRATORY_TRACT | Status: DC
  Administered 2019-05-23 – 2019-05-24 (×2): 0.5 mg via RESPIRATORY_TRACT
  Filled 2019-05-23 (×3): qty 2.5

## 2019-05-23 MED ORDER — SPIRONOLACTONE 25 MG PO TABS: 25.0000 mg | ORAL_TABLET | Freq: Every day | ORAL | Status: DC

## 2019-05-23 MED ORDER — SODIUM CHLORIDE 0.9 % IV BOLUS: 1000.0000 mL | Freq: Once | INTRAVENOUS | Status: DC

## 2019-05-23 MED ORDER — SODIUM CHLORIDE 0.9 % IV BOLUS
500.0000 mL | Freq: Once | INTRAVENOUS | Status: AC
  Administered 2019-05-23: 500 mL via INTRAVENOUS

## 2019-05-23 MED ORDER — IPRATROPIUM-ALBUTEROL 0.5-2.5 (3) MG/3ML IN SOLN: 3.0000 mL | Freq: Two times a day (BID) | RESPIRATORY_TRACT | Status: DC

## 2019-05-23 MED ORDER — FUROSEMIDE 40 MG PO TABS: 40.0000 mg | ORAL_TABLET | Freq: Every day | ORAL | Status: DC

## 2019-05-23 MED ORDER — SODIUM CHLORIDE 0.9 % IV SOLN
8.0000 mg/h | INTRAVENOUS | Status: AC
  Administered 2019-05-23 – 2019-05-26 (×7): 8 mg/h via INTRAVENOUS
  Filled 2019-05-23 (×8): qty 80

## 2019-05-23 MED ORDER — SODIUM CHLORIDE 0.9% IV SOLUTION: Freq: Once | INTRAVENOUS | Status: AC

## 2019-05-23 MED ORDER — LEVALBUTEROL HCL 0.63 MG/3ML IN NEBU: 0.6300 mg | INHALATION_SOLUTION | Freq: Four times a day (QID) | RESPIRATORY_TRACT | Status: DC | PRN

## 2019-05-23 MED ORDER — SODIUM CHLORIDE 0.9 % IV SOLN
80.0000 mg | Freq: Once | INTRAVENOUS | Status: AC
  Administered 2019-05-23: 80 mg via INTRAVENOUS
  Filled 2019-05-23: qty 80

## 2019-05-23 MED ORDER — PANTOPRAZOLE SODIUM 40 MG IV SOLR
40.0000 mg | Freq: Two times a day (BID) | INTRAVENOUS | Status: DC
  Administered 2019-05-27 – 2019-05-28 (×3): 40 mg via INTRAVENOUS
  Filled 2019-05-23 (×3): qty 40

## 2019-05-23 MED ORDER — SODIUM CHLORIDE 0.9 % IV BOLUS
250.0000 mL | Freq: Once | INTRAVENOUS | Status: AC
  Administered 2019-05-23: 250 mL via INTRAVENOUS

## 2019-05-23 NOTE — Progress Notes (Signed)
Pt's daughter April updated.

## 2019-05-23 NOTE — Progress Notes (Signed)
[facility]  PROGRESS NOTE  Richard Saunders NAT:[id number] DOB: [birth year] DOA: 05/09/2019 PCP: Tonia Ghent, MD   Brief HPI:   79 year old male with medical history of aortic insufficiency, atrial fibrillation on Eliquis, hypertension, LBBB, PVCs, nonalcoholic liver cirrhosis, history of ascites s/p paracentesis last year came with complaints of generalized weakness.  Patient was evaluated in the ED on 113 for abdominal pain and emesis.  Labs were drawn but patient left before seen by provider.  Labs done which showed elevated lipase at 2000 541 and he was advised to try return to the ED.  Patient received his first dose of Copaxone on 113 and has not been feeling well since then.  In the ED was found to be tachycardic, heart rate in 120s, tachypneic, respiratory and 20s.  Hypotensive with blood pressure 85/67.  Chest x-ray showed mild bibasilar atelectasis or infiltrate with associated pleural effusion.  C abdomen showed advanced liver cirrhosis with moderate amount of ascites.  Peripancreatic fat concerning with low level of pancreatitis.  He received IV fluids, IV antibiotics.  CCM was consulted.  Patient admitted with sepsis secondary to SBP and colitis.  Was found to have decompensated liver failure and ascites.  He underwent paracentesis x2.  Also received IV albumin.  Also required thoracentesis.    Subjective   Patient seen and examined, breathing is improved.  Not much diuresis with IV Lasix.  Renal function slightly worse today.   Assessment/Plan:     1. Sepsis-resolved, secondary to SBP/colitis.  Chest x-ray showed infiltrates. CT abdomen pelvis showed possible colitis.  CCM was consulted.   C. difficile negative.  Patient underwent paracentesis, ascitic fluid analysis showed 80% neutrophils, total nucleated cells 1,365.  Continue ceftriaxone and Flagyl, will complete 10 days of therapy. 2. Acute hypoxic respiratory failure-likely from underlying ascites, left pleural  effusion.  Patient underwent both thoracentesis and paracentesis yesterday.  Also given albumin for SBP protocol.  Breathing has improved.  Repeat chest x-ray shows improvement.   Pulmonary following.  We will switch from albuterol to Xopenex as needed due to tachycardia. 3. Acute pancreatitis-supportive care, has mild elevation of AST.  Lipase improved to 101.  Started on clear liquid diet.  CT abdomen shows some edema of the peripancreatic fat, likely low-level pancreatitis.  No advanced pancreatitis or pseudocyst. 4. Decompensated liver cirrhosis-patient underwent paracentesis, 2.6 L fluid removed on 05/15/2019.  Albumin 30 g every 6 hours for 4 doses, first day then 40 mg twice daily for 2 days.  Patient underwent paracentesis on 05/18/2019, 2.5 L of clear ascitic fluid was obtained.  Will hold IV Lasix and Aldactone today.  Start Lasix 40 mg daily and Aldactone 25 mg daily from tomorrow morning 5. Diarrhea-C. difficile was negative, diarrhea improved.  Continue IV Flagyl, ceftriaxone. 6. Acute kidney injury on CKD stage IIIa-Baseline creatinine 1.2, he presented with creatinine of 2.6.  Likely from hypovolemia, sepsis hypotension.  He received IV fluids.  Creatinine is trending down.  Today creatinine is 1.46.  Will hold IV Lasix and Aldactone today.  Follow BMP in a.m.  Patient has chronically low bicarb, in the setting of nonanion gap metabolic acidosis.  Started on  bicarb supplementation 650 mg p.o. twice daily 7. Atrial fibrillation-Eliquis on hold due to multiple procedures as above, continue metoprolol 12.5 mg p.o. twice daily.  Will restart Eliquis in next 2 to 3 days if patient does not requiring any further procedures.  Patient is currently on heparin per pharmacy.  SpO2: 94 % O2 Flow Rate (L/min): 3 L/min     Lab Results  Component Value Date   SARSCOV2NAA NEGATIVE 05/29/2019     CBG: No results for input(s): GLUCAP in the last 168 hours.  CBC: Recent Labs  Lab 05/19/19 0502  05/20/19 0247 05/21/19 0611 05/22/19 0243 05/23/19 0230  WBC 10.6* 8.6 11.6* 14.0* 16.8*  HGB 12.8* 12.3* 13.2 13.5 13.4  HCT 38.5* 36.5* 40.4 40.7 40.3  MCV 107.8* 106.7* 108.3* 108.8* 108.3*  PLT 126* 112* 131* 122* 126*    Basic Metabolic Panel: Recent Labs  Lab 05/19/19 0502 05/20/19 0247 05/21/19 0611 05/22/19 0243 05/23/19 0230  NA 136 140 140 139 139  K 4.6 4.5 4.3 4.6 4.8  CL 103 108 105 108 106  CO2 20* 20* 25 20* 24  GLUCOSE 84 98 132* 132* 116*  BUN 68* 67* 65* 64* 66*  CREATININE 1.53* 1.46* 1.39* 1.26* 1.46*  CALCIUM 9.3 9.7 9.9 10.2 10.3     Liver Function Tests: Recent Labs  Lab 05/19/19 0502 05/20/19 0247 05/21/19 0611 05/22/19 0243 05/23/19 0230  AST 28 26 25 26 24   ALT 20 17 16 15 15   ALKPHOS 38 32* 29* 32* 34*  BILITOT 1.8* 1.5* 1.2 1.0 1.3*  PROT 6.2* 6.1* 6.1* 5.6* 5.5*  ALBUMIN 4.3 4.6 4.8 4.2 3.8        DVT prophylaxis: Heparin  Code Status: Full code  Family Communication: No family at bedside  Disposition Plan: likely home when medically ready for discharge         Scheduled medications:  . [START ON 05/23/2019] furosemide  40 mg Oral Daily  . ipratropium-albuterol  3 mL Nebulization BID  . metoprolol succinate  12.5 mg Oral BID  . sodium bicarbonate  650 mg Oral BID  . [START ON 05/11/2019] spironolactone  25 mg Oral Daily    Consultants:  PCCM  Procedures:  Thoracentesis  Paracentesis  Antibiotics:   Anti-infectives (From admission, onward)   Start     Dose/Rate Route Frequency Ordered Stop   05/17/19 1000  cefTRIAXone (ROCEPHIN) 2 g in sodium chloride 0.9 % 100 mL IVPB     2 g 200 mL/hr over 30 Minutes Intravenous Every 24 hours 05/17/19 0919 05/27/19 0959   05/16/19 1330  ceFEPIme (MAXIPIME) 2 g in sodium chloride 0.9 % 100 mL IVPB  Status:  Discontinued     2 g 200 mL/hr over 30 Minutes Intravenous Every 12 hours 05/16/19 1247 05/17/19 0919   05/15/19 1800  vancomycin (VANCOCIN) IVPB 1000 mg/200  mL premix  Status:  Discontinued     1,000 mg 200 mL/hr over 60 Minutes Intravenous  Once 05/15/19 0814 05/15/19 1044   05/15/19 1400  ceFEPIme (MAXIPIME) 2 g in sodium chloride 0.9 % 100 mL IVPB  Status:  Discontinued     2 g 200 mL/hr over 30 Minutes Intravenous Every 24 hours 05/09/2019 1501 05/16/19 1247   05/21/2019 2145  metroNIDAZOLE (FLAGYL) IVPB 500 mg     500 mg 100 mL/hr over 60 Minutes Intravenous Every 8 hours 05/20/2019 2137 05/20/2019 2144   05/23/2019 1500  vancomycin variable dose per unstable renal function (pharmacist dosing)  Status:  Discontinued      Does not apply See admin instructions 05/05/2019 1501 05/15/19 1044   05/23/2019 1400  ceFEPIme (MAXIPIME) 2 g in sodium chloride 0.9 % 100 mL IVPB     2 g 200 mL/hr over 30 Minutes Intravenous  Once 05/19/2019 1355 05/02/2019 1448  05/26/2019 1400  metroNIDAZOLE (FLAGYL) IVPB 500 mg     500 mg 100 mL/hr over 60 Minutes Intravenous  Once 05/25/2019 1355 05/16/2019 1445   05/22/2019 1400  vancomycin (VANCOCIN) IVPB 1000 mg/200 mL premix  Status:  Discontinued     1,000 mg 200 mL/hr over 60 Minutes Intravenous  Once 05/19/2019 1355 05/10/2019 1356   05/11/2019 1400  vancomycin (VANCOREADY) IVPB 1750 mg/350 mL     1,750 mg 175 mL/hr over 120 Minutes Intravenous  Once 05/07/2019 1356 05/03/2019 1923       Objective   Vitals:   05/23/19 0729 05/23/19 0803 05/23/19 1004 05/23/19 1112  BP:  98/76    Pulse:  (!) 126 (!) 128   Resp:  (!) 24  (!) 27  Temp:  (!) 97.5 F (36.4 C)  (!) 97.5 F (36.4 C)  TempSrc:  Oral    SpO2: 97% 94%    Weight:      Height:        Intake/Output Summary (Last 24 hours) at 05/23/2019 1231 Last data filed at 05/22/2019 2144 Gross per 24 hour  Intake 686.61 ml  Output 475 ml  Net 211.61 ml    01/22 1901 - 01/24 0700 In: 1373.5 [P.O.:480; I.V.:593.5] Out: 475 [Urine:475]  Filed Weights   05/05/2019 1336 05/22/2019 2143 05/19/19 0400  Weight: 85.8 kg 87.4 kg 90.5 kg    Physical Examination:  General-appears in  no acute distress Heart-S1-S2, regular, no murmur auscultated Lungs-bibasilar crackles Abdomen-soft, nontender, no organomegaly Extremities-no edema in the lower extremities Neuro-alert, oriented x3, no focal deficit noted   Data Reviewed:   Recent Results (from the past 240 hour(s))  Blood Culture (routine x 2)     Status: None   Collection Time: 05/05/2019  2:10 PM   Specimen: BLOOD  Result Value Ref Range Status   Specimen Description BLOOD RIGHT ANTECUBITAL  Final   Special Requests   Final    BOTTLES DRAWN AEROBIC ONLY Blood Culture results may not be optimal due to an inadequate volume of blood received in culture bottles   Culture   Final    NO GROWTH 5 DAYS Performed at [facility], [place] [address], [place], [place] [address]    Report Status 05/19/2019 FINAL  Final  Blood Culture (routine x 2)     Status: None   Collection Time: 05/16/2019  2:10 PM   Specimen: BLOOD  Result Value Ref Range Status   Specimen Description BLOOD LEFT ANTECUBITAL  Final   Special Requests   Final    BOTTLES DRAWN AEROBIC AND ANAEROBIC Blood Culture adequate volume   Culture   Final    NO GROWTH 5 DAYS Performed at [facility], [place] [address] [place], [place] [address]    Report Status 05/19/2019 FINAL  Final  Respiratory Panel by RT PCR (Flu A&B, Covid) - Nasopharyngeal Swab     Status: None   Collection Time: 04/30/2019  2:20 PM   Specimen: Nasopharyngeal Swab  Result Value Ref Range Status   SARS Coronavirus 2 by RT PCR NEGATIVE NEGATIVE Final    Comment: (NOTE) SARS-CoV-2 target nucleic acids are NOT DETECTED. The SARS-CoV-2 RNA is generally detectable in upper respiratoy specimens during the acute phase of infection. The lowest concentration of SARS-CoV-2 viral copies this assay can detect is 131 copies/mL. A negative result does not preclude SARS-Cov-2 infection and should not be used as the sole basis for treatment or other patient management decisions. A negative  result may occur with  improper specimen collection/handling, submission of specimen other than nasopharyngeal swab, presence of viral mutation(s) within the areas targeted by this assay, and inadequate number of viral copies (<131 copies/mL). A negative result must be combined with clinical observations, patient history, and epidemiological information. The expected result is Negative. Fact Sheet for Patients:  PinkCheek.be Fact Sheet for Healthcare Providers:  GravelBags.it This test is not yet ap proved or cleared by the [place] FDA and  has been authorized for detection and/or diagnosis of SARS-CoV-2 by FDA under an Emergency Use Authorization (EUA). This EUA will remain  in effect (meaning this test can be used) for the duration of the COVID-19 declaration under Section 564(b)(1) of the Act, 21 U.S.C. section 360bbb-3(b)(1), unless the authorization is terminated or revoked sooner.    Influenza A by PCR NEGATIVE NEGATIVE Final   Influenza B by PCR NEGATIVE NEGATIVE Final    Comment: (NOTE) The Xpert Xpress SARS-CoV-2/FLU/RSV assay is intended as an aid in  the diagnosis of influenza from Nasopharyngeal swab specimens and  should not be used as a sole basis for treatment. Nasal washings and  aspirates are unacceptable for Xpert Xpress SARS-CoV-2/FLU/RSV  testing. Fact Sheet for Patients: PinkCheek.be Fact Sheet for Healthcare Providers: GravelBags.it This test is not yet approved or cleared by the [place] FDA and  has been authorized for detection and/or diagnosis of SARS-CoV-2 by  FDA under an Emergency Use Authorization (EUA). This EUA will remain  in effect (meaning this test can be used) for the duration of the  Covid-19 declaration under Section 564(b)(1) of the Act, 21  U.S.C. section 360bbb-3(b)(1), unless the authorization is  terminated or  revoked. Performed at [facility], [place] [address], [place], [place] [address]   C difficile quick scan w PCR reflex     Status: None   Collection Time: 05/15/19  1:35 AM   Specimen: STOOL  Result Value Ref Range Status   C Diff antigen NEGATIVE NEGATIVE Final   C Diff toxin NEGATIVE NEGATIVE Final   C Diff interpretation No C. difficile detected.  Final    Comment: Performed at [facility], [place] [address] [place], [place] [address]  GI pathogen panel by PCR, stool     Status: None   Collection Time: 05/15/19  1:35 AM   Specimen: Stool  Result Value Ref Range Status   Plesiomonas shigelloides NOT DETECTED NOT DETECTED Final   Yersinia enterocolitica NOT DETECTED NOT DETECTED Final   Vibrio NOT DETECTED NOT DETECTED Final   Enteropathogenic E coli NOT DETECTED NOT DETECTED Final   E coli (ETEC) LT/ST NOT DETECTED NOT DETECTED Final   E coli 1157 by PCR Not applicable NOT DETECTED Final   Cryptosporidium by PCR NOT DETECTED NOT DETECTED Final   Entamoeba histolytica NOT DETECTED NOT DETECTED Final   Adenovirus F 40/41 NOT DETECTED NOT DETECTED Final   Norovirus GI/GII NOT DETECTED NOT DETECTED Final   Sapovirus NOT DETECTED NOT DETECTED Final    Comment: (NOTE) Performed At: [facility] [place], [place] [id number] Rush Farmer MD [id number]    Vibrio cholerae NOT DETECTED NOT DETECTED Final   Campylobacter by PCR NOT DETECTED NOT DETECTED Final   Salmonella by PCR NOT DETECTED NOT DETECTED Final   E coli (STEC) NOT DETECTED NOT DETECTED Final   Enteroaggregative E coli NOT DETECTED NOT DETECTED Final   Shigella by PCR NOT DETECTED NOT DETECTED Final   Cyclospora  cayetanensis NOT DETECTED NOT DETECTED Final   Astrovirus NOT DETECTED NOT DETECTED Final   G lamblia by PCR NOT DETECTED NOT DETECTED Final   Rotavirus A by PCR NOT DETECTED NOT DETECTED Final  Urine culture     Status: None   Collection Time: 05/15/19  9:10 AM   Specimen:  In/Out Cath Urine  Result Value Ref Range Status   Specimen Description IN/OUT CATH URINE  Final   Special Requests NONE  Final   Culture   Final    NO GROWTH Performed at [facility], [place] [address] [place], [place] [address]    Report Status 05/16/2019 FINAL  Final  Body fluid culture     Status: None   Collection Time: 05/16/19  3:12 PM   Specimen: Peritoneal Washings; Body Fluid  Result Value Ref Range Status   Specimen Description PERITONEAL  Final   Special Requests Normal  Final   Gram Stain   Final    FEW WBC PRESENT,BOTH PMN AND MONONUCLEAR NO ORGANISMS SEEN    Culture   Final    NO GROWTH 3 DAYS Performed at [facility], [address] [place], [place] [address]    Report Status 05/19/2019 FINAL  Final  Body fluid culture     Status: None   Collection Time: 05/18/19  5:29 PM   Specimen: Body Fluid  Result Value Ref Range Status   Specimen Description FLUID PARACENTESIS  Final   Special Requests NONE  Final   Gram Stain   Final    FEW WBC PRESENT, PREDOMINANTLY PMN NO ORGANISMS SEEN    Culture   Final    NO GROWTH Performed at [facility], [address] [place], [place] [address]    Report Status 05/22/2019 FINAL  Final  Body fluid culture     Status: None   Collection Time: 05/18/19  5:31 PM   Specimen: Body Fluid  Result Value Ref Range Status   Specimen Description FLUID LEFT PLEURAL  Final   Special Requests NONE  Final   Gram Stain   Final    FEW WBC PRESENT,BOTH PMN AND MONONUCLEAR NO ORGANISMS SEEN    Culture   Final    NO GROWTH Performed at [facility], [address], [place], [place] [address]    Report Status 05/21/2019 FINAL  Final    No results for input(s): LIPASE, AMYLASE in the last 168 hours. Recent Labs  Lab 05/18/19 0943  AMMONIA 25    Cardiac Enzymes: No results for input(s): CKTOTAL, CKMB, CKMBINDEX, TROPONINI in the last 168 hours. BNP (last 3 results) Recent Labs    05/28/18 1115  BNP  73.5    ProBNP (last 3 results) Recent Labs    06/05/18 1356 06/11/18 1644  PROBNP 116.0* 81.0    Studies:  No results found.   Admission status: Inpatient: Based on patients clinical presentation and evaluation of above clinical data, I have made determination that patient meets Inpatient criteria at this time.   Oswald Hillock   [facility] If 7PM-7AM, please contact night-coverage at [email or web address], Office  [phone]  password [email or web address]  05/23/2019, 12:31 PM  LOS: 9 days

## 2019-05-23 NOTE — Progress Notes (Signed)
Pt had a large dark stool getting bright red. Pt looks pale and weak. I got on shift report that the dark stool started around 1800, provider on call notified and new orders received. We'll continue to monitor.

## 2019-05-23 NOTE — Progress Notes (Signed)
Provider on call came up to the floor and assessed the patient,. Patient received a 500 ml NS bolus, 80 mg bolus PROTONIX, and is receiving 8 mg/h continuous. Pt had another BM of bright red blood, provider on call notified via [facility]. Awaiting for lab results. We'll continue to monitor.

## 2019-05-23 NOTE — Progress Notes (Signed)
Called to the bedside by RN with concerns for ?GI Bleed, hypotension and tachycardia. According to RN patient had several dark tarry bowel movements throughout the day. Around 2030, pt began having BRBPR . SBP has remained in the 70's throughout the day despite several boluses. On assessment, patient is alert and orients with complaints of dull abdominal pain which has been present since admission but has worsened over the past couple of hours. He also complains of SOB and generalized weakness. He denies any chest pain, N/V. Abdomen is distended and tender, BS+.   Active GI Bleed - CBC, PT/INR - Discontinue Heparin gtt at this time - Type and cross, 2 units PRBC - NS IVF bolus  - Started on Protonix gtt - GI Consulted. Awaiting a phone call back - Low threshold for transfer to ICU  Lovey Newcomer, NP [facility] 7p-7a [phone]

## 2019-05-23 NOTE — Progress Notes (Signed)
ANTICOAGULATION CONSULT NOTE   Pharmacy Consult for Heparin Indication: atrial fibrillation  Patient Measurements: Height: 6' 1"  (185.4 cm) Weight: 199 lb 8.3 oz (90.5 kg) IBW/kg (Calculated) : 79.9 Heparin Dosing Weight: 87kg  Vital Signs: Temp: 97.5 F (36.4 C) (01/24 0803) Temp Source: Oral (01/24 0803) BP: 98/76 (01/24 0803) Pulse Rate: 126 (01/24 0803)  Labs: Recent Labs    05/21/19 1595 05/21/19 1641 05/22/19 0243 05/22/19 0243 05/22/19 1107 05/22/19 1842 05/23/19 0230  HGB 13.2  --  13.5  --   --   --  13.4  HCT 40.4  --  40.7  --   --   --  40.3  PLT 131*  --  122*  --   --   --  126*  HEPARINUNFRC 0.15*   < > 0.28*   < > 0.58 0.62 0.61  CREATININE 1.39*  --  1.26*  --   --   --  1.46*   < > = values in this interval not displayed.    Estimated Creatinine Clearance: 47.1 mL/min (A) (by C-G formula based on SCr of 1.46 mg/dL (H)).  Assessment: 79 year old male on apixaban PTA for afib (CHADS2VASc = 4) with SBP on antibiotics. He is s/p paracentesis and apixaban is on hold (last dose 1/15). Pharmacy dosing heparin.   Heparin level remains therapeutic at 0.61 on 1500 units/hour. H&H is stable today at 13.4/40.3, plts are low but stable at 126.   Goal of Therapy:  Heparin level 0.3-0.7 units/ml Monitor platelets by anticoagulation protocol: Yes   Plan:  Continue heparin at 1500 units/hr Monitor daily heparin level, CBC Monitor for signs/symptoms of bleeding  F/u restart apixaban   Thank you,   Eddie Candle, PharmD PGY-1 Pharmacy Resident   Please check amion for clinical pharmacist contact number

## 2019-05-24 ENCOUNTER — Encounter (HOSPITAL_COMMUNITY): Payer: Self-pay | Admitting: Internal Medicine

## 2019-05-24 ENCOUNTER — Encounter (HOSPITAL_COMMUNITY): Admission: EM | Disposition: E | Payer: Self-pay | Source: Home / Self Care | Attending: Family Medicine

## 2019-05-24 ENCOUNTER — Inpatient Hospital Stay (HOSPITAL_COMMUNITY): Payer: Medicare HMO

## 2019-05-24 DIAGNOSIS — K922 Gastrointestinal hemorrhage, unspecified: Secondary | ICD-10-CM

## 2019-05-24 HISTORY — PX: ESOPHAGOGASTRODUODENOSCOPY (EGD) WITH PROPOFOL: SHX5813

## 2019-05-24 LAB — CBC
HCT: 36.3 % — ABNORMAL LOW (ref 39.0–52.0)
HCT: 40.1 % (ref 39.0–52.0)
Hemoglobin: 12 g/dL — ABNORMAL LOW (ref 13.0–17.0)
Hemoglobin: 13 g/dL (ref 13.0–17.0)
MCH: 34.6 pg — ABNORMAL HIGH (ref 26.0–34.0)
MCH: 34.1 pg — ABNORMAL HIGH (ref 26.0–34.0)
MCHC: 33.1 g/dL (ref 30.0–36.0)
MCHC: 32.4 g/dL (ref 30.0–36.0)
MCV: 104.6 fL — ABNORMAL HIGH (ref 80.0–100.0)
MCV: 105.2 fL — ABNORMAL HIGH (ref 80.0–100.0)
Platelets: 139 10*3/uL — ABNORMAL LOW (ref 150–400)
Platelets: 172 10*3/uL (ref 150–400)
RBC: 3.47 MIL/uL — ABNORMAL LOW (ref 4.22–5.81)
RBC: 3.81 MIL/uL — ABNORMAL LOW (ref 4.22–5.81)
RDW: 19.9 % — ABNORMAL HIGH (ref 11.5–15.5)
RDW: 21 % — ABNORMAL HIGH (ref 11.5–15.5)
WBC: 27.1 10*3/uL — ABNORMAL HIGH (ref 4.0–10.5)
WBC: 34 10*3/uL — ABNORMAL HIGH (ref 4.0–10.5)
nRBC: 0.3 % — ABNORMAL HIGH (ref 0.0–0.2)
nRBC: 0.5 % — ABNORMAL HIGH (ref 0.0–0.2)

## 2019-05-24 LAB — CBC WITH DIFFERENTIAL/PLATELET
Abs Immature Granulocytes: 1.09 10*3/uL — ABNORMAL HIGH (ref 0.00–0.07)
Basophils Absolute: 0.2 10*3/uL — ABNORMAL HIGH (ref 0.0–0.1)
Basophils Relative: 1 %
Eosinophils Absolute: 0.3 10*3/uL (ref 0.0–0.5)
Eosinophils Relative: 1 %
HCT: 36 % — ABNORMAL LOW (ref 39.0–52.0)
Hemoglobin: 11.6 g/dL — ABNORMAL LOW (ref 13.0–17.0)
Immature Granulocytes: 4 %
Lymphocytes Relative: 9 %
Lymphs Abs: 2.3 10*3/uL (ref 0.7–4.0)
MCH: 36.7 pg — ABNORMAL HIGH (ref 26.0–34.0)
MCHC: 32.2 g/dL (ref 30.0–36.0)
MCV: 113.9 fL — ABNORMAL HIGH (ref 80.0–100.0)
Monocytes Absolute: 2.6 10*3/uL — ABNORMAL HIGH (ref 0.1–1.0)
Monocytes Relative: 10 %
Neutro Abs: 20 10*3/uL — ABNORMAL HIGH (ref 1.7–7.7)
Neutrophils Relative %: 75 %
Platelets: 161 10*3/uL (ref 150–400)
RBC: 3.16 MIL/uL — ABNORMAL LOW (ref 4.22–5.81)
RDW: 15.3 % (ref 11.5–15.5)
WBC: 26.3 10*3/uL — ABNORMAL HIGH (ref 4.0–10.5)
nRBC: 0.4 % — ABNORMAL HIGH (ref 0.0–0.2)

## 2019-05-24 LAB — COMPREHENSIVE METABOLIC PANEL
ALT: 60 U/L — ABNORMAL HIGH (ref 0–44)
AST: 94 U/L — ABNORMAL HIGH (ref 15–41)
Albumin: 3.6 g/dL (ref 3.5–5.0)
Alkaline Phosphatase: 31 U/L — ABNORMAL LOW (ref 38–126)
Anion gap: 14 (ref 5–15)
BUN: 85 mg/dL — ABNORMAL HIGH (ref 8–23)
CO2: 17 mmol/L — ABNORMAL LOW (ref 22–32)
Calcium: 9.9 mg/dL (ref 8.9–10.3)
Chloride: 108 mmol/L (ref 98–111)
Creatinine, Ser: 2.28 mg/dL — ABNORMAL HIGH (ref 0.61–1.24)
GFR calc Af Amer: 31 mL/min — ABNORMAL LOW (ref >60)
GFR calc non Af Amer: 26 mL/min — ABNORMAL LOW (ref >60)
Glucose, Bld: 89 mg/dL (ref 70–99)
Potassium: 6.2 mmol/L — ABNORMAL HIGH (ref 3.5–5.1)
Sodium: 139 mmol/L (ref 135–145)
Total Bilirubin: 1 mg/dL (ref 0.3–1.2)
Total Protein: 5.2 g/dL — ABNORMAL LOW (ref 6.5–8.1)

## 2019-05-24 LAB — BASIC METABOLIC PANEL
Anion gap: 9 (ref 5–15)
BUN: 86 mg/dL — ABNORMAL HIGH (ref 8–23)
CO2: 21 mmol/L — ABNORMAL LOW (ref 22–32)
Calcium: 9.7 mg/dL (ref 8.9–10.3)
Chloride: 108 mmol/L (ref 98–111)
Creatinine, Ser: 2.38 mg/dL — ABNORMAL HIGH (ref 0.61–1.24)
GFR calc Af Amer: 29 mL/min — ABNORMAL LOW (ref >60)
GFR calc non Af Amer: 25 mL/min — ABNORMAL LOW (ref >60)
Glucose, Bld: 109 mg/dL — ABNORMAL HIGH (ref 70–99)
Potassium: 5.7 mmol/L — ABNORMAL HIGH (ref 3.5–5.1)
Sodium: 138 mmol/L (ref 135–145)

## 2019-05-24 LAB — HEMOGLOBIN AND HEMATOCRIT, BLOOD
HCT: 36 % — ABNORMAL LOW (ref 39.0–52.0)
Hemoglobin: 12.1 g/dL — ABNORMAL LOW (ref 13.0–17.0)

## 2019-05-24 LAB — LACTIC ACID, PLASMA
Lactic Acid, Venous: 3.6 mmol/L (ref 0.5–1.9)
Lactic Acid, Venous: 2 mmol/L (ref 0.5–1.9)

## 2019-05-24 LAB — PREPARE RBC (CROSSMATCH)

## 2019-05-24 LAB — GLUCOSE, CAPILLARY
Glucose-Capillary: 76 mg/dL (ref 70–99)
Glucose-Capillary: 103 mg/dL — ABNORMAL HIGH (ref 70–99)

## 2019-05-24 LAB — ABO/RH: ABO/RH(D): O POS

## 2019-05-24 LAB — MRSA PCR SCREENING: MRSA by PCR: NEGATIVE

## 2019-05-24 LAB — CORTISOL: Cortisol, Plasma: 38.6 ug/dL

## 2019-05-24 LAB — PROTIME-INR
INR: 2.6 — ABNORMAL HIGH (ref 0.8–1.2)
Prothrombin Time: 27.9 seconds — ABNORMAL HIGH (ref 11.4–15.2)

## 2019-05-24 SURGERY — ESOPHAGOGASTRODUODENOSCOPY (EGD) WITH PROPOFOL
Anesthesia: Moderate Sedation

## 2019-05-24 MED ORDER — MIDAZOLAM HCL (PF) 5 MG/ML IJ SOLN
INTRAMUSCULAR | Status: AC
  Filled 2019-05-24: qty 3

## 2019-05-24 MED ORDER — MIDAZOLAM HCL (PF) 10 MG/2ML IJ SOLN
INTRAMUSCULAR | Status: DC | PRN
  Administered 2019-05-24: 1 mg via INTRAVENOUS

## 2019-05-24 MED ORDER — NOREPINEPHRINE 4 MG/250ML-% IV SOLN
0.0000 ug/min | INTRAVENOUS | Status: DC
  Administered 2019-05-24: 2 ug/min via INTRAVENOUS
  Administered 2019-05-24: 13 ug/min via INTRAVENOUS
  Administered 2019-05-25: 8 ug/min via INTRAVENOUS
  Administered 2019-05-25: 10 ug/min via INTRAVENOUS
  Administered 2019-05-25: 13 ug/min via INTRAVENOUS
  Filled 2019-05-24 (×5): qty 250

## 2019-05-24 MED ORDER — SODIUM CHLORIDE 0.9 % IV BOLUS: 500.0000 mL | Freq: Once | INTRAVENOUS | Status: DC

## 2019-05-24 MED ORDER — SODIUM CHLORIDE 0.9% IV SOLUTION: Freq: Once | INTRAVENOUS | Status: AC

## 2019-05-24 MED ORDER — VITAMIN K1 10 MG/ML IJ SOLN
10.0000 mg | Freq: Once | INTRAVENOUS | Status: AC
  Administered 2019-05-24: 10 mg via INTRAVENOUS
  Filled 2019-05-24: qty 1

## 2019-05-24 MED ORDER — SODIUM CHLORIDE 0.9 % IV BOLUS
1000.0000 mL | Freq: Once | INTRAVENOUS | Status: AC
  Administered 2019-05-24: 1000 mL via INTRAVENOUS

## 2019-05-24 MED ORDER — ALBUMIN HUMAN 25 % IV SOLN
12.5000 g | Freq: Once | INTRAVENOUS | Status: AC
  Administered 2019-05-24: 12.5 g via INTRAVENOUS
  Filled 2019-05-24: qty 100

## 2019-05-24 MED ORDER — CHLORHEXIDINE GLUCONATE CLOTH 2 % EX PADS
6.0000 | MEDICATED_PAD | Freq: Every day | CUTANEOUS | Status: DC
  Administered 2019-05-24 – 2019-05-28 (×5): 6 via TOPICAL

## 2019-05-24 MED ORDER — FENTANYL CITRATE (PF) 100 MCG/2ML IJ SOLN
INTRAMUSCULAR | Status: DC | PRN
  Administered 2019-05-24: 25 ug via INTRAVENOUS

## 2019-05-24 MED ORDER — SODIUM CHLORIDE 0.9% IV SOLUTION: Freq: Once | INTRAVENOUS | Status: DC

## 2019-05-24 MED ORDER — SODIUM CHLORIDE 0.9 % IV SOLN: INTRAVENOUS | Status: DC | PRN

## 2019-05-24 MED ORDER — SODIUM CHLORIDE 0.9 % IV BOLUS
1000.0000 mL | Freq: Once | INTRAVENOUS | Status: AC
  Administered 2019-05-24: 13:00:00 1000 mL via INTRAVENOUS

## 2019-05-24 MED ORDER — FENTANYL CITRATE (PF) 100 MCG/2ML IJ SOLN
INTRAMUSCULAR | Status: AC
  Filled 2019-05-24: qty 2

## 2019-05-24 MED ORDER — SODIUM CHLORIDE 0.9 % IV SOLN: INTRAVENOUS | Status: DC

## 2019-05-24 MED ORDER — SODIUM CHLORIDE 0.9 % IV SOLN
50.0000 ug/h | INTRAVENOUS | Status: AC
  Administered 2019-05-24 – 2019-05-26 (×7): 50 ug/h via INTRAVENOUS
  Filled 2019-05-24 (×8): qty 1

## 2019-05-24 MED ORDER — OCTREOTIDE ACETATE 50 MCG/ML IJ SOLN
50.0000 ug | Freq: Once | INTRAMUSCULAR | Status: AC
  Administered 2019-05-24: 50 ug via SUBCUTANEOUS
  Filled 2019-05-24: qty 1

## 2019-05-24 SURGICAL SUPPLY — 15 items

## 2019-05-24 NOTE — Consult Note (Signed)
NAME:  Richard Saunders, MRN:  [id number], DOB:  06-03-1940, LOS: 4 ADMISSION DATE:  05/13/2019, CONSULTATION DATE:  05/15/19 REFERRING MD: Shela Leff, MD  CHIEF COMPLAINT:  Hypotension  Brief History   79 year old male with cirrhosis with SBP- presented with N/V/D and admitted for sepsis secondary to GI etiology. PCCM consulted for hypotension. Following for chronic left pleural effusion.  History of present illness   Richard Saunders is a 79 year old male with the below medical history who presented to the ED for abdominal pain, nausea, vomiting and weakness on 1/13-1/14. He left AMA however his lab results after he left demonstrated elevated lipase 2501. He returned to the ED with similar symptoms as well as new onset diarrhea. Prior to admission he has had poor PO intake, decreased UOP and tachycardia in the last week. His toprolol was increased by his Cardiologist for tachycardia. Of note, he did receive COVID vaccination on 1/13 but states symptoms started prior to this.   In the ED he was hypotensive with BP 85/67. Labs significant for WBC 24.8, K 5.5, BUN/Cr 58/2.6 and LA 4.3. CT A/P with cirrhosis and ascites, pancreatitis and bilateral pleural effusion. ED [place] with no pocket seen for ascites drainage. Given IVF and broad spectrum antibiotics with improvement in hypotension.  1/16, patient with persistent hypotension. Has received total 5.5L overnight and on mIVF 125/hr and was transferred to the ICU, transferred back to the floor on 1/22.  Per nursing report, pt began having dark stool in the afternoon of 1/24 throughout the evening, has remained hypotensive and receive 250cc bolus during the day and 1L overnight..  Hgb dropped 2g from 13 to 11.  Around 7p, stool changed to dark maroon and nursing/patient report at least 5 BM's of dark, red blood. 2 units PRBC's ordered.  INR 2.9 and platelets 161. Heparin gtt stopped at 2026.   Pt sees [facility] and last endoscopy in 2020 with  possible varices.   Past Medical History  Atrial fibrillation on Eliquis, hx of PVS, aortic insufficiency, HTN, non-alcoholic cirrhosis, hx of ascites, anxiety/depression  Significant Hospital Events   1/13 Presented to ED with N/V/abdominal pain. Left AMA 1/15 Returned to ED  Consults:  [facility] PCCM  Procedures:  1/17 para 1/19 para 1/19 thora  Significant Diagnostic Tests:  CT A/P Advanced cirrhosis of the liver, ascites, pancreatitis, bilateral pleural effusions (L>R)  Ascitic fluid 1/17: 1,365 WBCs (80% PMNs) Ascitic fluid 1/19: WBC 1015 (79% PMNs), LD 112, albumin <1, protein <3, gram stain WBCs L Pleural fluid 1/19: WBC 835 (47% PMNs), LD 122, albumin <1, protein <3, glucose 86, gram stain WBCs  Micro Data:  1/15 Sars-Cov-2>>neg 1/15 BCx2>>no growth 1/16 C.diff>>  negative  1/17 Ascitic fluid- few WBC >>no growth  1/16 Urine culture >>NG  Antimicrobials:  Vanc 1/15 Cefepime 1/15> 1/17 Flagyl 1/15 > Ceftriaxone 1/18>>  Interim history/subjective:  Pt hypotensive, with continued bleeding, but conversation and denies any abdominal pain  Objective   Blood pressure (!) 80/62, pulse (!) 125, temperature 97.8 F (36.6 C), temperature source Axillary, resp. rate 20, height 6' 1"  (1.854 m), weight 90.5 kg, SpO2 100 %.        Intake/Output Summary (Last 24 hours) at 05/23/2019 0335 Last data filed at 05/09/2019 0230 Gross per 24 hour  Intake 1024.91 ml  Output 950 ml  Net 74.91 ml   Filed Weights   05/08/2019 1336 05/17/2019 2143 05/19/19 0400  Weight: 85.8 kg 87.4 kg 90.5 kg  General:  Elderly, chronically-ill appearing M in no acute distress HEENT: MM pink/moist Neuro: awake, alert and responsive CV: s1s2 tachycardic, irregular, no m/r/g PULM:  Decreased air movement in the bases without respiratory distress GI: enlarged, soft abdomen without peritoneal signs or TTP Extremities: warm/dry, no edema  Skin: no rashes or lesions    Resolved Hospital Problem  list   Hypoxic respiratory failure, sepsis   Assessment & Plan:   Acute GIB, likely upper in the setting of coagulopathy and heparin gtt -Concern for variceal bleed given EGD findings 11/2018 -INR 2.9 P: -transfer to ICU, obtain large bore peripheral IV access. Stat cbc after 1st unit PRBC's -serial h/h, complete 2nd unit PRBC's with 2 units cross-matched on hold -Vit K 43m, transfuse 2 units FFP and 2 units platelets -Hospitalist NP [facility], they will scope in the morning and recommends octreotide  -Continue protonix gtt -If signs of worsening hemorrhagic shock may need central line, trend lactic acid monitor BP    Acute hypoxic respiratory failure, L pleural effusion -improving, s/p thoracentesis 1/19 -Maintaining adequate oxygen sats on 2L O2 P: -continue supplemental O2, repeat CXR in am after volume resuscitation, may need Lasix -continue duonebs     SBP -ascitic fluid cultures without growth, will need lifelong prophylaxis  -Continue ceftriaxone and Flagyl   Afib with RVR -d/c heparin and hold metoprolol in the setting of the above, HR 110-120's overnight P: -resume rate control with beta blocker when able   AKI, likely prerenal- -creatinine stable 1.46 P: -continue to monitor metabolic panel and uop    Best practice:  Diet: NPO Pain/Anxiety/Delirium protocol (if indicated): -- VAP protocol (if indicated): -- DVT prophylaxis: SCD GI prophylaxis: protonix Glucose control: Per primary Mobility: As tolerated Code Status: Full Disposition:  ICU  Labs   CBC: Recent Labs  Lab 05/20/19 0247 05/21/19 0611 05/22/19 0243 05/23/19 0230 05/23/19 2208  WBC 8.6 11.6* 14.0* 16.8* 26.3*  NEUTROABS  --   --   --   --  20.0*  HGB 12.3* 13.2 13.5 13.4 11.6*  HCT 36.5* 40.4 40.7 40.3 36.0*  MCV 106.7* 108.3* 108.8* 108.3* 113.9*  PLT 112* 131* 122* 126* 1161   Basic Metabolic Panel: Recent Labs  Lab 05/19/19 0502  05/20/19 0247 05/21/19 0611 05/22/19 0243 05/23/19 0230  NA 136 140 140 139 139  K 4.6 4.5 4.3 4.6 4.8  CL 103 108 105 108 106  CO2 20* 20* 25 20* 24  GLUCOSE 84 98 132* 132* 116*  BUN 68* 67* 65* 64* 66*  CREATININE 1.53* 1.46* 1.39* 1.26* 1.46*  CALCIUM 9.3 9.7 9.9 10.2 10.3   GFR: Estimated Creatinine Clearance: 47.1 mL/min (A) (by C-G formula based on SCr of 1.46 mg/dL (H)). Recent Labs  Lab 05/21/19 0611 05/22/19 0243 05/23/19 0230 05/23/19 2208  WBC 11.6* 14.0* 16.8* 26.3*    Liver Function Tests: Recent Labs  Lab 05/19/19 0502 05/20/19 0247 05/21/19 0611 05/22/19 0243 05/23/19 0230  AST 28 26 25 26 24   ALT 20 17 16 15 15   ALKPHOS 38 32* 29* 32* 34*  BILITOT 1.8* 1.5* 1.2 1.0 1.3*  PROT 6.2* 6.1* 6.1* 5.6* 5.5*  ALBUMIN 4.3 4.6 4.8 4.2 3.8   No results for input(s): LIPASE, AMYLASE in the last 168 hours. Recent Labs  Lab 05/18/19 0943  AMMONIA 25    ABG    Component Value Date/Time   TCO2 24 07/25/2011 1411     Coagulation  Profile: Recent Labs  Lab 05/18/19 0656 05/23/19 2208  INR 1.9* 2.6*    Cardiac Enzymes: No results for input(s): CKTOTAL, CKMB, CKMBINDEX, TROPONINI in the last 168 hours.  HbA1C: Hgb A1c MFr Bld  Date/Time Value Ref Range Status  07/29/2016 09:14 AM 5.2 4.6 - 6.5 % Final    Comment:    Glycemic Control Guidelines for People with Diabetes:Non Diabetic:  <6%Goal of Therapy: <7%Additional Action Suggested:  >8%   07/22/2012 09:41 AM 5.5 4.6 - 6.5 % Final    Comment:    Glycemic Control Guidelines for People with Diabetes:Non Diabetic:  <6%Goal of Therapy: <7%Additional Action Suggested:  >8%    CRITICAL CARE Performed by: Otilio Carpen Mylene Bow   Total critical care time: 65 minutes  Critical care time was exclusive of separately billable procedures and treating other patients.  Critical care was necessary to treat or prevent imminent or life-threatening deterioration secondary to hemorrhagic shock.  Critical care  was time spent personally by me on the following activities: development of treatment plan with patient and/or surrogate as well as nursing, discussions with consultants, evaluation of patient's response to treatment, examination of patient, obtaining history from patient or surrogate, ordering and performing treatments and interventions, ordering and review of laboratory studies, ordering and review of radiographic studies, pulse oximetry and re-evaluation of patient's condition.  Otilio Carpen Genea Rheaume, PA-C [place]  [phone]

## 2019-05-24 NOTE — Progress Notes (Signed)
Responded to PIV consult. Pt being transported to new room. Discussed PIV needs with RN. Pt will need 3rd PIV and does not require certain gauge per protocol for ordered treatment.Notified RN of this.

## 2019-05-24 NOTE — Progress Notes (Signed)
Patient is being transferred to a higher level of care, triad provider on call, RRT RN and the Critical care PA came up to the patient's room throughout the shift and assessed the patient and the level of care needed. Pt being transferred now with PRBC 1st unit running, IV PROTONIX by the Unit CN and Rosalita Chessman, Therapist, sports. Pt alert and weak. Report given to receiving RN

## 2019-05-24 NOTE — Progress Notes (Signed)
Physical Therapy Treatment and D/C  Patient Details Name: Richard Saunders MRN: [id number] DOB: 1941-04-04 Today's Date: 05/30/2019    History of Present Illness 79yo male c/o generalized weakness and malaise. Had first covid vaccine on 05/12/19, has not felt well since. Hypotensive in the ED, covid negative. Admitted for severe sepsis of unknown etiology, acutely decompensated liver cirrhosis. PMH anxiety, aortic insufficiency, A-fib, HTN, LBBB    PT Comments    Pt admitted with above diagnosis. Pt performed bed level exercises today only and MD has now discontinued PT order until pt more stable.  MD states he will reconsult PT when appropriate.   Encouraged pt to perform exercises with UE and LEs in bed as able during day to maintain strength and pt agrees.  Will sign off from skilled PT at this time due to MD discontinued order.   Follow Up Recommendations  (TBA when pt is more medically stable)     Equipment Recommendations       Recommendations for Other Services       Precautions / Restrictions Precautions Precautions: Fall Precaution Comments: watch vitals, hypotensive, easily fatigued Restrictions Weight Bearing Restrictions: No    Mobility  Bed Mobility               General bed mobility comments: Exercise in bed only as getting ready for bedside EGD.   Transfers                    Ambulation/Gait                 Stairs             Wheelchair Mobility    Modified Rankin (Stroke Patients Only)       Balance                                            Cognition Arousal/Alertness: Awake/alert Behavior During Therapy: WFL for tasks assessed/performed Overall Cognitive Status: Within Functional Limits for tasks assessed                                 General Comments: very polite and participatory, just very fatigued. Good command and cue following, motivated to mobilize      Exercises  General Exercises - Upper Extremity Shoulder Flexion: AROM;Both;10 reps;Supine Shoulder Horizontal ADduction: AROM;Both;10 reps;Supine General Exercises - Lower Extremity Ankle Circles/Pumps: AROM;Both;5 reps;Supine Quad Sets: AROM;Both;5 reps;Supine Gluteal Sets: AROM;Both;5 reps;Supine Heel Slides: AAROM;Both;5 reps;Supine    General Comments General comments (skin integrity, edema, etc.): BP 96/61       Pertinent Vitals/Pain Pain Assessment: No/denies pain    Home Living                      Prior Function            PT Goals (current goals can now be found in the care plan section) Acute Rehab PT Goals Patient Stated Goal: get stronger, regain independence Progress towards PT goals: Not progressing toward goals - comment(Having testing done due to rectal bleeding)    Frequency    Min 3X/week      PT Plan Discharge plan needs to be updated    Co-evaluation              AM-PAC  PT "6 Clicks" Mobility   Outcome Measure  Help needed turning from your back to your side while in a flat bed without using bedrails?: A Little Help needed moving from lying on your back to sitting on the side of a flat bed without using bedrails?: A Little Help needed moving to and from a bed to a chair (including a wheelchair)?: Total Help needed standing up from a chair using your arms (e.g., wheelchair or bedside chair)?: Total Help needed to walk in hospital room?: Total Help needed climbing 3-5 steps with a railing? : Total 6 Click Score: 10    End of Session   Activity Tolerance: Patient limited by fatigue Patient left: with call bell/phone within reach;in bed;with nursing/sitter in room Nurse Communication: Mobility status PT Visit Diagnosis: Unsteadiness on feet (R26.81);Difficulty in walking, not elsewhere classified (R26.2);Muscle weakness (generalized) (M62.81)     Time: 8616-8372 PT Time Calculation (min) (ACUTE ONLY): 10 min  Charges:  $Therapeutic  Exercise: 8-22 mins                     Toriano Aikey W,PT Acute Rehabilitation Services Pager:  [phone]  Office:  [place] 05/29/2019, 3:39 PM

## 2019-05-24 NOTE — Progress Notes (Signed)
Physical Therapy Discharge Patient Details Name: Richard Saunders MRN: [id number] DOB: 06/23/1940 Today's Date: 04/30/2019 Time: 5035-4656 PT Time Calculation (min) (ACUTE ONLY): 10 min  Patient discharged from PT services secondary to medical decline - will need to re-order PT to resume therapy services.  Please see latest therapy progress note for current level of functioning and progress toward goals.    Progress and discharge plan discussed with patient and/or caregiver: Patient/Caregiver agrees with plan  GP     Richard Saunders 05/23/2019, 3:44 PM  Richard Saunders,PT Acute Rehabilitation Services Pager:  [phone]  Office:  [phone]

## 2019-05-24 NOTE — Progress Notes (Signed)
Paged for persistent hypotension despite ~5.5L IV fluids and 2 units of PRBCs. Patient placed on Levophed at 0-15 mcg/min for peripheral access but remained hypotensive. Mentation is at baseline.   Plan Albumin bolus ordered Continue levophed May need central line access if his BP continues to worsen for increasing vasopressor dosing

## 2019-05-24 NOTE — Op Note (Signed)
[facility] Patient Name: Richard Saunders Procedure Date : 05/25/2019 MRN: [id number] Attending MD: Clarene Essex , MD Date of Birth: June 03, 1940 CSN: [id number] Age: 79 Admit Type: Inpatient Procedure:                Upper GI endoscopy Indications:              Hematochezia, Active gastrointestinal bleeding in                            patient with cirrhosis Providers:                Clarene Essex, MD, Janeece Agee, Technician, Glori Bickers, RN, Benay Pillow, RN Referring MD:              Medicines:                Fentanyl 25 micrograms IV, Midazolam 1 mg IV Complications:            No immediate complications. Estimated Blood Loss:     Estimated blood loss: none. Procedure:                Pre-Anesthesia Assessment:                           - Prior to the procedure, a History and Physical                            was performed, and patient medications and                            allergies were reviewed. The patient's tolerance of                            previous anesthesia was also reviewed. The risks                            and benefits of the procedure and the sedation                            options and risks were discussed with the patient.                            All questions were answered, and informed consent                            was obtained. Prior Anticoagulants: The patient has                            taken heparin, last dose was day of procedure. ASA                            Grade Assessment: III - A patient with severe  systemic disease. After reviewing the risks and                            benefits, the patient was deemed in satisfactory                            condition to undergo the procedure.                           After obtaining informed consent, the endoscope was                            passed under direct vision. Throughout the   procedure, the patient's blood pressure, pulse, and                            oxygen saturations were monitored continuously. The                            [id number]-[id number] ([id number] Olympus gastroscope was                            introduced through the mouth, and advanced to the                            third part of duodenum. The upper GI endoscopy was                            accomplished without difficulty. The patient                            tolerated the procedure well. Scope In: Scope Out: Findings:      The larynx was normal.      Grade I varices were found in the middle third of the esophagus and in       the lower third of the esophagus. They were small in size. No stigmata       of bleeding      One non-bleeding cratered gastric ulcer with no stigmata of bleeding was       found at the incisura.      Segmental moderate inflammation characterized by erosions and erythema       was found on the greater curvature of the stomach and on the lesser       curvature of the stomach.      Two non-bleeding cratered duodenal ulcers with pigmented material were       found in the duodenal bulb and in the first portion of the duodenum.       Both were large both were washed and watched and cannot be made to bleed       no obvious visible vessel was seen      The second portion of the duodenum and third portion of the duodenum       were normal.      The exam was otherwise without abnormality. Impression:               - Normal larynx.                           -  Grade I esophageal varices.                           - Non-bleeding gastric ulcer with no stigmata of                            bleeding.                           - Acute gastritis.                           - Non-bleeding duodenal ulcers with pigmented                            material.                           - Normal second portion of the duodenum and third                            portion of the duodenum.                            - The examination was otherwise normal.                           - No specimens collected. Moderate Sedation:      Moderate (conscious) sedation was administered by the endoscopy nurse       and supervised by the endoscopist. The following parameters were       monitored: oxygen saturation, heart rate, blood pressure, respiratory       rate, EKG, adequacy of pulmonary ventilation, and response to care. Recommendation:           - Clear liquid diet for 2 days.                           - Continue present medications. Question going                            forward whether patient truly needs blood thinners                            or antiplatelet agent with his INR being greater                            than 2 at baseline                           - Return to GI clinic PRN. No aspirin or                            nonsteroidals until ulcers healed                           - Telephone GI clinic if symptomatic PRN. Procedure Code(s):        ---  Professional ---                           [phone], Esophagogastroduodenoscopy, flexible,                            transoral; diagnostic, including collection of                            specimen(s) by brushing or washing, when performed                            (separate procedure) Diagnosis Code(s):        --- Professional ---                           I85.00, Esophageal varices without bleeding                           K25.9, Gastric ulcer, unspecified as acute or                            chronic, without hemorrhage or perforation                           K29.00, Acute gastritis without bleeding                           K26.9, Duodenal ulcer, unspecified as acute or                            chronic, without hemorrhage or perforation                           K92.1, Melena (includes Hematochezia)                           K92.2, Gastrointestinal hemorrhage, unspecified CPT copyright 2019 American Medical  Association. All rights reserved. The codes documented in this report are preliminary and upon coder review may  be revised to meet current compliance requirements. Clarene Essex, MD 05/25/2019 4:27:15 PM This report has been signed electronically. Number of Addenda: 0

## 2019-05-24 NOTE — Progress Notes (Signed)
Patient is a hard lab draw. Phlebotomy was only able to draw a small amount of blood from viens which was not enough sample for CBC. None of the PIVs draw back blood. A-line was ordered to be inserted for fluctuating BP. Will attempt to redraw blood sample when A-line is inserted.

## 2019-05-24 NOTE — Consult Note (Signed)
Reason for Consult: GI bleed Referring Physician: Hospital team  Richard Saunders is an 79 y.o. male.  HPI: Patient with history of Karlene Lineman cirrhosis and recent colonoscopy and endoscopy by my partner Dr. Cristina Gong with history of varices diverticuli polyps who was admitted for questionable pancreatitis but CT did not show that and he really just had lethargy and there was a question of sepsis in the hospital but no signs of GI bleeding until last night when it was mostly bright red blood and clots and his bleeding has continued today and he was on Eliquis at home and has been on a heparin drip here which was stopped this evening and he himself has no specific complaints and was not aware he was bleeding  Past Medical History:  Diagnosis Date  . Allergy   . Anxiety   . Aortic insufficiency    a. 04/2018 Echo: EF 55-60%. Mild to Mod MR. Mild AI/TR.  Marland Kitchen Atrial fibrillation ([place])    a. Dx 04/2018. CHA2DS2VASc = 4-->Eliquis.  . Cirrhosis ([place])   . Depression   . H/O: rheumatic fever 2012   (per patient) - eval by [facility] LBBB with PVC's  . History of chicken pox   . Hypertension   . LBBB (left bundle branch block)   . Organic impotence   . PVC's (premature ventricular contractions)     Past Surgical History:  Procedure Laterality Date  . Cautery for nosebleeds    . IR PARACENTESIS  06/19/2018  . Right elbow bursa removed    . TONSILLECTOMY AND ADENOIDECTOMY  1947    Family History  Problem Relation Age of Onset  . Cancer Mother        ovarian, uterine  . Aneurysm Mother        AAA in 80  . Cancer Father        Prostate  . COPD Father   . Prostate cancer Father   . Diabetes Other   . Heart disease Other        CAD <60 male  . Hypertension Other   . Heart disease Maternal Grandfather        died from possible "heart attack" in early 63's  . Heart failure Maternal Uncle   . Colon cancer Neg Hx     Social History:  reports that he quit smoking about 41 years ago. He quit  after 40.00 years of use. He has never used smokeless tobacco. He reports previous alcohol use. He reports that he does not use drugs.  Allergies:  Allergies  Allergen Reactions  . Colchicine Diarrhea  . Fluticasone Propionate Other (See Comments)    He didn't tolerate the taste/smell prev  . Nasacort [Triamcinolone] Other (See Comments)    Didn't tolerate    Medications: I have reviewed the patient's current medications.  Results for orders placed or performed during the hospital encounter of 05/25/2019 (from the past 48 hour(s))  Heparin level (unfractionated)     Status: None   Collection Time: 05/22/19  6:42 PM  Result Value Ref Range   Heparin Unfractionated 0.62 0.30 - 0.70 IU/mL    Comment: (NOTE) If heparin results are below expected values, and patient dosage has  been confirmed, suggest follow up testing of antithrombin III levels. Performed at [facility], [place] [address], [place], [place] [address]   Heparin level (unfractionated)     Status: None   Collection Time: 05/23/19  2:30 AM  Result Value Ref Range  Heparin Unfractionated 0.61 0.30 - 0.70 IU/mL    Comment: (NOTE) If heparin results are below expected values, and patient dosage has  been confirmed, suggest follow up testing of antithrombin III levels. Performed at [facility], [place] [address], [place], [place] [address]   CBC     Status: Abnormal   Collection Time: 05/23/19  2:30 AM  Result Value Ref Range   WBC 16.8 (H) 4.0 - 10.5 K/uL   RBC 3.72 (L) 4.22 - 5.81 MIL/uL   Hemoglobin 13.4 13.0 - 17.0 g/dL   HCT 40.3 39.0 - 52.0 %   MCV 108.3 (H) 80.0 - 100.0 fL   MCH 36.0 (H) 26.0 - 34.0 pg   MCHC 33.3 30.0 - 36.0 g/dL   RDW 14.8 11.5 - 15.5 %   Platelets 126 (L) 150 - 400 K/uL    Comment: Immature Platelet Fraction may be clinically indicated, consider ordering this additional test [id number]    nRBC 0.0 0.0 - 0.2 %    Comment: Performed at [facility], [place] [address],  [place], [place] [address]  Comprehensive metabolic panel     Status: Abnormal   Collection Time: 05/23/19  2:30 AM  Result Value Ref Range   Sodium 139 135 - 145 mmol/L   Potassium 4.8 3.5 - 5.1 mmol/L   Chloride 106 98 - 111 mmol/L   CO2 24 22 - 32 mmol/L   Glucose, Bld 116 (H) 70 - 99 mg/dL   BUN 66 (H) 8 - 23 mg/dL   Creatinine, Ser 1.46 (H) 0.61 - 1.24 mg/dL   Calcium 10.3 8.9 - 10.3 mg/dL   Total Protein 5.5 (L) 6.5 - 8.1 g/dL   Albumin 3.8 3.5 - 5.0 g/dL   AST 24 15 - 41 U/L   ALT 15 0 - 44 U/L   Alkaline Phosphatase 34 (L) 38 - 126 U/L   Total Bilirubin 1.3 (H) 0.3 - 1.2 mg/dL   GFR calc non Af Amer 45 (L) >60 mL/min   GFR calc Af Amer 53 (L) >60 mL/min   Anion gap 9 5 - 15    Comment: Performed at [facility], [place] [address] [place], [place] [address]  Type and screen Bertie     Status: None (Preliminary result)   Collection Time: 05/23/19 10:08 PM  Result Value Ref Range   ABO/RH(D) O POS    Antibody Screen NEG    Sample Expiration 05/26/2019,2359    Unit Number [id number]    Blood Component Type RED CELLS,LR    Unit division 00    Status of Unit ISSUED    Transfusion Status OK TO TRANSFUSE    Crossmatch Result      Compatible Performed at [facility], [place] [address] [place], [place] [address]    Unit Number [id number]    Blood Component Type RED CELLS,LR    Unit division 00    Status of Unit ISSUED    Transfusion Status OK TO TRANSFUSE    Crossmatch Result Compatible   Prepare RBC     Status: None   Collection Time: 05/23/19 10:08 PM  Result Value Ref Range   Order Confirmation      ORDER PROCESSED BY BLOOD BANK Performed at [facility], [place] [address] [place], [place] [address]   CBC with Differential/Platelet     Status: Abnormal   Collection Time: 05/23/19 10:08 PM  Result Value Ref Range  WBC 26.3 (H) 4.0 - 10.5 K/uL   RBC 3.16 (L) 4.22 - 5.81 MIL/uL   Hemoglobin 11.6 (L) 13.0 - 17.0 g/dL   HCT 36.0  (L) 39.0 - 52.0 %   MCV 113.9 (H) 80.0 - 100.0 fL   MCH 36.7 (H) 26.0 - 34.0 pg   MCHC 32.2 30.0 - 36.0 g/dL   RDW 15.3 11.5 - 15.5 %   Platelets 161 150 - 400 K/uL   nRBC 0.4 (H) 0.0 - 0.2 %   Neutrophils Relative % 75 %   Neutro Abs 20.0 (H) 1.7 - 7.7 K/uL   Lymphocytes Relative 9 %   Lymphs Abs 2.3 0.7 - 4.0 K/uL   Monocytes Relative 10 %   Monocytes Absolute 2.6 (H) 0.1 - 1.0 K/uL   Eosinophils Relative 1 %   Eosinophils Absolute 0.3 0.0 - 0.5 K/uL   Basophils Relative 1 %   Basophils Absolute 0.2 (H) 0.0 - 0.1 K/uL   Immature Granulocytes 4 %   Abs Immature Granulocytes 1.09 (H) 0.00 - 0.07 K/uL    Comment: Performed at [facility], [address], [place], [place] [address]  Protime-INR     Status: Abnormal   Collection Time: 05/23/19 10:08 PM  Result Value Ref Range   Prothrombin Time 27.9 (H) 11.4 - 15.2 seconds   INR 2.6 (H) 0.8 - 1.2    Comment: (NOTE) INR goal varies based on device and disease states. Performed at [facility], [place] [address], [place], [place] [address]   ABO/Rh     Status: None   Collection Time: 05/23/19 10:08 PM  Result Value Ref Range   ABO/RH(D)      O POS Performed at [facility] [address], [place], [place] [address]   Prepare fresh frozen plasma     Status: None (Preliminary result)   Collection Time: 05/23/2019  2:41 AM  Result Value Ref Range   Unit Number [id number]    Blood Component Type THW PLS APHR    Unit division A0    Status of Unit ALLOCATED    Transfusion Status OK TO TRANSFUSE    Unit Number [id number]    Blood Component Type THAWED PLASMA    Unit division 00    Status of Unit ISSUED    Transfusion Status      OK TO TRANSFUSE Performed at [facility], [place] [address] [place], [place] [address]   MRSA PCR Screening     Status: None   Collection Time: 05/11/2019  3:23 AM   Specimen: Nasal Mucosa; Nasopharyngeal  Result Value Ref Range   MRSA by PCR NEGATIVE NEGATIVE    Comment:         The GeneXpert MRSA Assay (FDA approved for NASAL specimens only), is one component of a comprehensive MRSA colonization surveillance program. It is not intended to diagnose MRSA infection nor to guide or monitor treatment for MRSA infections. Performed at [facility], [place] [address], [place], [place] [address]   Glucose, capillary     Status: None   Collection Time: 05/13/2019  3:38 AM  Result Value Ref Range   Glucose-Capillary 76 70 - 99 mg/dL  Comprehensive metabolic panel     Status: Abnormal   Collection Time: 05/29/2019  5:17 AM  Result Value Ref Range   Sodium 139 135 - 145 mmol/L   Potassium 6.2 (H) 3.5 - 5.1 mmol/L   Chloride 108 98 - 111  mmol/L   CO2 17 (L) 22 - 32 mmol/L   Glucose, Bld 89 70 - 99 mg/dL   BUN 85 (H) 8 - 23 mg/dL   Creatinine, Ser 2.28 (H) 0.61 - 1.24 mg/dL   Calcium 9.9 8.9 - 10.3 mg/dL   Total Protein 5.2 (L) 6.5 - 8.1 g/dL   Albumin 3.6 3.5 - 5.0 g/dL   AST 94 (H) 15 - 41 U/L   ALT 60 (H) 0 - 44 U/L   Alkaline Phosphatase 31 (L) 38 - 126 U/L   Total Bilirubin 1.0 0.3 - 1.2 mg/dL   GFR calc non Af Amer 26 (L) >60 mL/min   GFR calc Af Amer 31 (L) >60 mL/min   Anion gap 14 5 - 15    Comment: Performed at [facility], [address], [place], [place] [address]  Lactic acid, plasma     Status: Abnormal   Collection Time: 05/18/2019  5:17 AM  Result Value Ref Range   Lactic Acid, Venous 3.6 (HH) 0.5 - 1.9 mmol/L    Comment: CRITICAL VALUE NOTED.  VALUE IS CONSISTENT WITH PREVIOUSLY REPORTED AND CALLED VALUE. CRITICAL RESULT CALLED TO, READ BACK BY AND VERIFIED WITH: R.MICHAEL,RN 05/10/2019 5400 DAVISB Performed at [facility], [place] [address] [place], [place] [address]   CBC     Status: Abnormal   Collection Time: 05/21/2019  6:43 AM  Result Value Ref Range   WBC 27.1 (H) 4.0 - 10.5 K/uL    Comment: REPEATED TO VERIFY WHITE COUNT CONFIRMED ON SMEAR    RBC 3.47 (L) 4.22 - 5.81 MIL/uL   Hemoglobin 12.0 (L) 13.0 - 17.0 g/dL    HCT 36.3 (L) 39.0 - 52.0 %   MCV 104.6 (H) 80.0 - 100.0 fL    Comment: REPEATED TO VERIFY   MCH 34.6 (H) 26.0 - 34.0 pg   MCHC 33.1 30.0 - 36.0 g/dL   RDW 19.9 (H) 11.5 - 15.5 %   Platelets 139 (L) 150 - 400 K/uL   nRBC 0.3 (H) 0.0 - 0.2 %    Comment: Performed at [facility], [place] [address] [place], [place] [address]  Basic metabolic panel     Status: Abnormal   Collection Time: 05/23/2019  8:15 AM  Result Value Ref Range   Sodium 138 135 - 145 mmol/L   Potassium 5.7 (H) 3.5 - 5.1 mmol/L   Chloride 108 98 - 111 mmol/L   CO2 21 (L) 22 - 32 mmol/L   Glucose, Bld 109 (H) 70 - 99 mg/dL   BUN 86 (H) 8 - 23 mg/dL   Creatinine, Ser 2.38 (H) 0.61 - 1.24 mg/dL   Calcium 9.7 8.9 - 10.3 mg/dL   GFR calc non Af Amer 25 (L) >60 mL/min   GFR calc Af Amer 29 (L) >60 mL/min   Anion gap 9 5 - 15    Comment: Performed at [facility] [address] [place], [place] [address]  Lactic acid, plasma     Status: Abnormal   Collection Time: 05/25/2019  8:15 AM  Result Value Ref Range   Lactic Acid, Venous 2.0 (HH) 0.5 - 1.9 mmol/L    Comment: CRITICAL VALUE NOTED.  VALUE IS CONSISTENT WITH PREVIOUSLY REPORTED AND CALLED VALUE. Performed at [facility], [place] [address], [place], [place] [address]   Hemoglobin and hematocrit, blood     Status: Abnormal   Collection Time: 05/21/2019 10:19 AM  Result Value  Ref Range   Hemoglobin 12.1 (L) 13.0 - 17.0 g/dL   HCT 36.0 (L) 39.0 - 52.0 %    Comment: Performed at [facility] [address], [place], [place] [address]  Glucose, capillary     Status: Abnormal   Collection Time: 05/21/2019 11:18 AM  Result Value Ref Range   Glucose-Capillary 103 (H) 70 - 99 mg/dL    DG CHEST PORT 1 VIEW  Result Date: 05/12/2019 CLINICAL DATA:  Volume overload EXAM: PORTABLE CHEST 1 VIEW COMPARISON:  Five days ago FINDINGS: Low volume chest with left pleural effusion that is likely moderate and progressed (with fluid seen extending more cranially and  more haziness of the left chest). Much of the left lung is obscured. The right lung appears comparatively clear. Cardiomegaly and vascular pedicle widening. IMPRESSION: 1. Progressive layering left pleural effusion. 2. Cardiomegaly and low lung volumes. Electronically Signed   By: Monte Fantasia M.D.   On: 05/19/2019 07:46    Review of Systems negative except above he is hypotensive but not symptomatic Blood pressure (!) 83/58, pulse 68, temperature 97.7 F (36.5 C), temperature source Axillary, resp. rate 16, height 6' 1"  (1.854 m), weight 90.5 kg, SpO2 100 %. Physical Exam lungs are clear heart regular rate and rhythm patient answers questions appropriately abdomen is soft nontender probable some ascites significant increased BUN and creatinine minimal elevated transaminases hemoglobin okay platelets okay White count increased to 27 INR 2.6 he did get 2 units of FFP according to the nurse CT reviewed from 10 days ago Assessment/Plan: GI blood loss in patient with cirrhosis due to [place] with multiple medical problems Plan: We discussed endoscopy including the risks and will proceed this afternoon with further work-up and plans pending those findings  White E 05/15/2019, 2:24 PM

## 2019-05-24 NOTE — Progress Notes (Signed)
Inpatient Rehabilitation Admissions Coordinator  Noted events over last 24 hrs. Rehab consult discontinued when pt admitted to ICU. I will follow at a distance.  Danne Baxter, RN, MSN Rehab Admissions Coordinator [phone] 05/01/2019 1:26 PM

## 2019-05-24 NOTE — Progress Notes (Addendum)
NAME:  Richard Saunders, MRN:  [id number], DOB:  01/22/1941, LOS: 75 ADMISSION DATE:  05/23/2019, CONSULTATION DATE:  05/15/19 REFERRING MD: Shela Leff, MD  CHIEF COMPLAINT:  Hypotension  Brief History   79 year old male with cirrhosis with SBP- presented with N/V/D and admitted for sepsis secondary to GI etiology. PCCM consulted for hypotension. Following for chronic left pleural effusion.  History of present illness   Richard Saunders is a 79 year old male with the below medical history who presented to the ED for abdominal pain, nausea, vomiting and weakness on 1/13-1/14. He left AMA however his lab results after he left demonstrated elevated lipase 2501. He returned to the ED with similar symptoms as well as new onset diarrhea. Prior to admission he has had poor PO intake, decreased UOP and tachycardia in the last week. His toprolol was increased by his Cardiologist for tachycardia. Of note, he did receive COVID vaccination on 1/13 but states symptoms started prior to this.   In the ED he was hypotensive with BP 85/67. Labs significant for WBC 24.8, K 5.5, BUN/Cr 58/2.6 and LA 4.3. CT A/P with cirrhosis and ascites, pancreatitis and bilateral pleural effusion. ED [place] with no pocket seen for ascites drainage. Given IVF and broad spectrum antibiotics with improvement in hypotension.  1/16, patient with persistent hypotension. Has received total 5.5L overnight and on mIVF 125/hr and was transferred to the ICU, transferred back to the floor on 1/22.  Per nursing report, pt began having dark stool in the afternoon of 1/24 throughout the evening, has remained hypotensive and receive 250cc bolus during the day and 1L overnight..  Hgb dropped 2g from 13 to 11.  Around 7p, stool changed to dark maroon and nursing/patient report at least 5 BM's of dark, red blood. 2 units PRBC's ordered.  INR 2.9 and platelets 161. Heparin gtt stopped at 2026.   Pt sees [facility] and last endoscopy in 2020 with  possible varices.   Past Medical History  Atrial fibrillation on Eliquis, hx of PVS, aortic insufficiency, HTN, non-alcoholic cirrhosis, hx of ascites, anxiety/depression.  Significant Hospital Events   1/13 Presented to ED with N/V/abdominal pain. Left AMA 1/15 Returned to ED 1/24 Admitted to the ICU  Consults:  [facility] PCCM  Procedures:  1/17 para 1/19 para 1/19 thora  Significant Diagnostic Tests:  CT A/P Advanced cirrhosis of the liver, ascites, pancreatitis, bilateral pleural effusions (L>R) Ascitic fluid 1/17: 1,365 WBCs (80% PMNs) Ascitic fluid 1/19: WBC 1015 (79% PMNs), LD 112, albumin <1, protein <3, gram stain WBCs L Pleural fluid 1/19: WBC 835 (47% PMNs), LD 122, albumin <1, protein <3, glucose 86, gram stain WBCs  Micro Data:  1/15 Sars-Cov-2>> neg 1/15 BCx2>> no growth 1/16 C.diff>> negative 1/17 Ascitic fluid- few WBC >> no growth 1/16 Urine culture >> NG  Antimicrobials:  Vanc 1/15 > 1/15 Cefepime 1/15> 1/17 Flagyl 1/15 > Ceftriaxone 1/18>>  Interim history/subjective:  Patient denied complaints overnight. Is confused as to the   Objective   Blood pressure (!) 103/58, pulse (!) 118, temperature 97.7 F (36.5 C), temperature source Axillary, resp. rate 15, height 6' 1"  (1.854 m), weight 90.5 kg, SpO2 98 %.        Intake/Output Summary (Last 24 hours) at 05/09/2019 0643 Last data filed at 05/23/2019 0500 Gross per 24 hour  Intake 2147.68 ml  Output 950 ml  Net 1197.68 ml   Filed Weights   05/04/2019 1336 05/19/2019 2143 05/19/19 0400  Weight: 85.8  kg 87.4 kg 90.5 kg   Physical Exam: General: A/O x4, in no acute distress, afebrile, nondiaphoretic HEENT: PEERL, EMO intact Cardio: Irregular, tachycardic, no mrg's  Pulmonary: CTA bilaterally, no wheezing or crackles  Abdomen: Bowel sounds normal, soft, nontender  MSK: BLE nontender, nonedematous Neuro: Alert, follows all commands with ease, moves all four extremeties Psych: Appropriate affect, not  depressed in appearance, engages well Skin: Warm to touch  I/O: Total In 7L, out 4L  Resolved Hospital Problem list   Hypoxic respiratory failure, sepsis   Assessment & Plan:  Acute GIB, likely upper in the setting of coagulopathy and heparin gtt Concern for variceal bleed given EGD findings 11/2018. INR 2.9 2/2 to cirrhosis. Hgb 11.6 on 1/24. Nursing unable to draw 1/24 am CBC. Hospitalist NP spoke with Dr Penelope Coop on call for [facility], they have tentatively planned to scope the morning of 1/25 and recommended octreotide in the meantime. Hgb stable. Plan -Trend CBC for ongoing blood loss -Patient was given Vit K 62m, transfused 2 units FFP and 2 units platelets -Continue protonix gtt -If signs of worsening hemorrhagic shock may need central line, trend lactic acid monitor BP  Acute hypoxic respiratory failure, L pleural effusion Interval worsening of the left lung opacity diffusely and left lower lobe. Likely 2/2 to ascites from cirrhosis. Albumin surprisingly WNL's at 3.6. S/p thoracentesis 1/19. Maintaining adequate oxygen sats on 2L O2. may need Lasix as he is up 3L since admission. Plan -continue supplemental O2, repeat CXR in am after volume resuscitation,  -continue duonebs  Hyperkalemia: Patients potassium slightly elevated the past several days but not as markedly as the morning of 1/25. His labs are all significantly deranged this morning. Will confirm hyperkalemia prior to aggressive treatment.  Plan -Obtain EKG and treat hyperkalemia if concerning -Trend CMP daily  SBP Ascitic fluid cultures without growth, will need lifelong prophylaxis. WBC increasing but afebrile. BP remains soft which in the setting of cirrhosis with ascites could be portal hypertension. -Continue ceftriaxone and Flagyl  Afib with RVR -d/c heparin and hold metoprolol in the setting of the above, HR 110-120's overnight Plan -resume rate control with beta blocker when able  AKI, likely  prerenal- -creatinine stable 1.46 Plan -continue to monitor metabolic panel and uop  Best practice:  Diet: NPO Pain/Anxiety/Delirium protocol (if indicated): -- VAP protocol (if indicated): -- DVT prophylaxis: SCD GI prophylaxis: protonix Glucose control: Per primary Mobility: As tolerated Code Status: Full Disposition:  ICU  Labs   CBC: Recent Labs  Lab 05/20/19 0247 05/21/19 0611 05/22/19 0243 05/23/19 0230 05/23/19 2208  WBC 8.6 11.6* 14.0* 16.8* 26.3*  NEUTROABS  --   --   --   --  20.0*  HGB 12.3* 13.2 13.5 13.4 11.6*  HCT 36.5* 40.4 40.7 40.3 36.0*  MCV 106.7* 108.3* 108.8* 108.3* 113.9*  PLT 112* 131* 122* 126* 1720  Basic Metabolic Panel: Recent Labs  Lab 05/20/19 0247 05/21/19 0611 05/22/19 0243 05/23/19 0230 05/13/2019 0517  NA 140 140 139 139 139  K 4.5 4.3 4.6 4.8 6.2*  CL 108 105 108 106 108  CO2 20* 25 20* 24 17*  GLUCOSE 98 132* 132* 116* 89  BUN 67* 65* 64* 66* 85*  CREATININE 1.46* 1.39* 1.26* 1.46* 2.28*  CALCIUM 9.7 9.9 10.2 10.3 9.9   GFR: Estimated Creatinine Clearance: 30.2 mL/min (A) (by C-G formula based on SCr of 2.28 mg/dL (H)). Recent Labs  Lab 05/21/19 [id number]/23/21 0243 05/23/19 0230 05/23/19 2208 05/16/2019 09628  WBC 11.6* 14.0* 16.8* 26.3*  --   LATICACIDVEN  --   --   --   --  3.6*   Liver Function Tests: Recent Labs  Lab 05/20/19 0247 05/21/19 0611 05/22/19 0243 05/23/19 0230 05/16/2019 0517  AST 26 25 26 24  94*  ALT 17 16 15 15  60*  ALKPHOS 32* 29* 32* 34* 31*  BILITOT 1.5* 1.2 1.0 1.3* 1.0  PROT 6.1* 6.1* 5.6* 5.5* 5.2*  ALBUMIN 4.6 4.8 4.2 3.8 3.6   No results for input(s): LIPASE, AMYLASE in the last 168 hours. Recent Labs  Lab 05/18/19 0943  AMMONIA 25   ABG    Component Value Date/Time   TCO2 24 07/25/2011 1411    Coagulation Profile: Recent Labs  Lab 05/18/19 0656 05/23/19 2208  INR 1.9* 2.6*   Cardiac Enzymes: No results for input(s): CKTOTAL, CKMB, CKMBINDEX, TROPONINI in the last 168  hours.  HbA1C: Hgb A1c MFr Bld  Date/Time Value Ref Range Status  07/29/2016 09:14 AM 5.2 4.6 - 6.5 % Final    Comment:    Glycemic Control Guidelines for People with Diabetes:Non Diabetic:  <6%Goal of Therapy: <7%Additional Action Suggested:  >8%   07/22/2012 09:41 AM 5.5 4.6 - 6.5 % Final    Comment:    Glycemic Control Guidelines for People with Diabetes:Non Diabetic:  <6%Goal of Therapy: <7%Additional Action Suggested:  >8%    CRITICAL CARE  Kathi Ludwig, MD [facility], PGY-3  Please see Attending A/P and/or Addendum for final recommendations.  Attending section:  Denies chest pain/dyspnea.  Nausea better.  Wants something to eat.  BP (!) 74/58   Pulse 78   Temp 98.4 F (36.9 C) (Axillary)   Resp 15   Ht 6' 1"  (1.854 m)   Wt 90.5 kg   SpO2 99%   BMI 26.32 kg/m   Alert.  HR regular.  No wheeze.  Abdomen soft, mild distention.  Decreased muscle bulk.  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 - continue protonix gtt, octreotide - GI to see  Possible SBP. - continue ABx for now  Lt transudate pleural. - f/u CXR intermittently  Hypotension from hypovolemia in setting of Upper GI bleed. - continue IV fluids - likely can accept MAP goal of > 60 in setting of cirrhosis  AKI likely from hypovolemia. Hyperkalemia. - f/u BMET - continue IV fluids  A fib with RVR. - monitor heart rate  CC time by me independent of resident time 32 minutes  Chesley Mires, MD [place] 05/06/2019, 11:17 AM

## 2019-05-24 NOTE — Procedures (Signed)
Arterial Catheter Insertion Procedure Note YOSEPH HAILE [id number] [birth year]  Procedure: Insertion of Arterial Catheter  Indications: Blood pressure monitoring  Procedure Details Consent: Unable to obtain consent because of emergent medical necessity. Time Out: Verified patient identification, verified procedure, site/side was marked, verified correct patient position, special equipment/implants available, medications/allergies/relevent history reviewed, required imaging and test results available.  Performed  Maximum sterile technique was used including antiseptics, cap, gloves, gown, hand hygiene, mask and sheet. Skin prep: Chlorhexidine; local anesthetic administered 20 gauge catheter was inserted into right radial artery using the Seldinger technique. ULTRASOUND GUIDANCE USED: NO Evaluation Blood flow good; BP tracing good. Complications: No apparent complications.  RT placed arterial line per MD order and per protocol. No issues at this time.  Marlowe Aschoff 04/30/2019

## 2019-05-24 NOTE — Significant Event (Addendum)
Rapid Response Event Note  Overview: Asked to come eyeball pt by [facility] NP d/t hypotension and GIB. Pt here with sepsis(SBP and colitis) and has been on a heparin gtt for afib. This afternoon, pt began having dark red bowel movements. SBP has been 60-80s since noon. Time Called: 2325 Arrival Time: 2350 Event Type: Hypotension, Other (Comment)(GIB)  Initial Focused Assessment: Pt laying in bed, alert and oriented, with some confusion. Pt denies dizziness, SOB, and chest pain. Pt does c/o R sided abd pain. Lungs clear and diminished. Skin pale, warm, and dry. ABD distended but soft. Pt has had 4 bloody bowel movements since 7p. T-97.8(Ax), HR-120s(Afib), SBP 70-80s, RR-20s, SpO2- Interventions: NP to bedside PTA RRT and ordered: CBCD:  HBG-13.4>11.6, MMN81.7>71.1 PT/INR: 27.9/2.6 T and S, 2 units to be transfused 500cc NS bolus x 2 Protonix gtt GI consulted Plan of Care (if not transferred): Give NS and PRBCs, start protonix gtt. Continue to monitor BP. Update NP after 1st unit PRBCs as to BP(or if pt declines prior to). Call RRT if further assistance needed.   Update:0230-PCCM to bedside to assess pt. Will move to 2M06.  Event Summary: Name of Physician Notified: Kennon Holter, NP at 2325    at          [place], Carren Rang

## 2019-05-25 ENCOUNTER — Inpatient Hospital Stay (HOSPITAL_COMMUNITY): Payer: Medicare HMO

## 2019-05-25 ENCOUNTER — Other Ambulatory Visit (HOSPITAL_COMMUNITY): Payer: Medicare HMO

## 2019-05-25 DIAGNOSIS — I340 Nonrheumatic mitral (valve) insufficiency: Secondary | ICD-10-CM

## 2019-05-25 DIAGNOSIS — I4811 Longstanding persistent atrial fibrillation: Secondary | ICD-10-CM

## 2019-05-25 DIAGNOSIS — I361 Nonrheumatic tricuspid (valve) insufficiency: Secondary | ICD-10-CM

## 2019-05-25 LAB — COMPREHENSIVE METABOLIC PANEL
ALT: 54 U/L — ABNORMAL HIGH (ref 0–44)
AST: 127 U/L — ABNORMAL HIGH (ref 15–41)
Albumin: 3.7 g/dL (ref 3.5–5.0)
Alkaline Phosphatase: 34 U/L — ABNORMAL LOW (ref 38–126)
Anion gap: 10 (ref 5–15)
BUN: 94 mg/dL — ABNORMAL HIGH (ref 8–23)
CO2: 19 mmol/L — ABNORMAL LOW (ref 22–32)
Calcium: 9.4 mg/dL (ref 8.9–10.3)
Chloride: 110 mmol/L (ref 98–111)
Creatinine, Ser: 2.5 mg/dL — ABNORMAL HIGH (ref 0.61–1.24)
GFR calc Af Amer: 27 mL/min — ABNORMAL LOW (ref >60)
GFR calc non Af Amer: 24 mL/min — ABNORMAL LOW (ref >60)
Glucose, Bld: 132 mg/dL — ABNORMAL HIGH (ref 70–99)
Potassium: 5.8 mmol/L — ABNORMAL HIGH (ref 3.5–5.1)
Sodium: 139 mmol/L (ref 135–145)
Total Bilirubin: 2 mg/dL — ABNORMAL HIGH (ref 0.3–1.2)
Total Protein: 5.4 g/dL — ABNORMAL LOW (ref 6.5–8.1)

## 2019-05-25 LAB — POCT I-STAT EG7
Acid-base deficit: 6 mmol/L — ABNORMAL HIGH (ref 0.0–2.0)
Bicarbonate: 22.5 mmol/L (ref 20.0–28.0)
Calcium, Ion: 1.37 mmol/L (ref 1.15–1.40)
HCT: 35 % — ABNORMAL LOW (ref 39.0–52.0)
Hemoglobin: 11.9 g/dL — ABNORMAL LOW (ref 13.0–17.0)
O2 Saturation: 65 %
Patient temperature: 95
Potassium: 5.5 mmol/L — ABNORMAL HIGH (ref 3.5–5.1)
Sodium: 140 mmol/L (ref 135–145)
TCO2: 24 mmol/L (ref 22–32)
pCO2, Ven: 51 mmHg (ref 44.0–60.0)
pH, Ven: 7.243 — ABNORMAL LOW (ref 7.250–7.430)
pO2, Ven: 36 mmHg (ref 32.0–45.0)

## 2019-05-25 LAB — URINALYSIS, ROUTINE W REFLEX MICROSCOPIC
Bacteria, UA: NONE SEEN
Bilirubin Urine: NEGATIVE
Glucose, UA: NEGATIVE mg/dL
Hgb urine dipstick: NEGATIVE
Ketones, ur: NEGATIVE mg/dL
Leukocytes,Ua: NEGATIVE
Nitrite: NEGATIVE
Protein, ur: 30 mg/dL — AB
Specific Gravity, Urine: 1.018 (ref 1.005–1.030)
pH: 5 (ref 5.0–8.0)

## 2019-05-25 LAB — CBC WITH DIFFERENTIAL/PLATELET
Abs Immature Granulocytes: 0.89 10*3/uL — ABNORMAL HIGH (ref 0.00–0.07)
Basophils Absolute: 0.1 10*3/uL (ref 0.0–0.1)
Basophils Relative: 0 %
Eosinophils Absolute: 0.3 10*3/uL (ref 0.0–0.5)
Eosinophils Relative: 1 %
HCT: 37.3 % — ABNORMAL LOW (ref 39.0–52.0)
Hemoglobin: 12.3 g/dL — ABNORMAL LOW (ref 13.0–17.0)
Immature Granulocytes: 3 %
Lymphocytes Relative: 6 %
Lymphs Abs: 1.8 10*3/uL (ref 0.7–4.0)
MCH: 34.6 pg — ABNORMAL HIGH (ref 26.0–34.0)
MCHC: 33 g/dL (ref 30.0–36.0)
MCV: 105.1 fL — ABNORMAL HIGH (ref 80.0–100.0)
Monocytes Absolute: 2.2 10*3/uL — ABNORMAL HIGH (ref 0.1–1.0)
Monocytes Relative: 8 %
Neutro Abs: 23 10*3/uL — ABNORMAL HIGH (ref 1.7–7.7)
Neutrophils Relative %: 82 %
Platelets: 141 10*3/uL — ABNORMAL LOW (ref 150–400)
RBC: 3.55 MIL/uL — ABNORMAL LOW (ref 4.22–5.81)
RDW: 20.1 % — ABNORMAL HIGH (ref 11.5–15.5)
WBC: 28.4 10*3/uL — ABNORMAL HIGH (ref 4.0–10.5)
nRBC: 0.2 % (ref 0.0–0.2)

## 2019-05-25 LAB — SODIUM, URINE, RANDOM: Sodium, Ur: <10 mmol/L

## 2019-05-25 LAB — BPAM RBC
Blood Product Expiration Date: 202102232359
Blood Product Expiration Date: 202102232359
ISSUE DATE / TIME: 202101250130
ISSUE DATE / TIME: 202101250346
Unit Type and Rh: 5100
Unit Type and Rh: 5100

## 2019-05-25 LAB — TYPE AND SCREEN
ABO/RH(D): O POS
Antibody Screen: NEGATIVE
Unit division: 0
Unit division: 0

## 2019-05-25 LAB — ECHOCARDIOGRAM COMPLETE
Height: 73 in
Weight: 3192.26 oz

## 2019-05-25 MED ORDER — FENTANYL CITRATE (PF) 100 MCG/2ML IJ SOLN
12.5000 ug | Freq: Once | INTRAMUSCULAR | Status: AC
  Administered 2019-05-25: 12.5 ug via INTRAVENOUS

## 2019-05-25 MED ORDER — WHITE PETROLATUM EX OINT
TOPICAL_OINTMENT | CUTANEOUS | Status: AC
  Filled 2019-05-25: qty 28.35

## 2019-05-25 MED ORDER — FENTANYL CITRATE (PF) 100 MCG/2ML IJ SOLN: 12.5000 ug | Freq: Once | INTRAMUSCULAR | Status: AC

## 2019-05-25 MED ORDER — MIDAZOLAM HCL 2 MG/2ML IJ SOLN: 0.5000 mg | Freq: Once | INTRAMUSCULAR | Status: AC

## 2019-05-25 MED ORDER — MIDAZOLAM HCL 2 MG/2ML IJ SOLN
INTRAMUSCULAR | Status: AC
  Administered 2019-05-25: 0.5 mg via INTRAVENOUS
  Filled 2019-05-25: qty 2

## 2019-05-25 MED ORDER — SODIUM POLYSTYRENE SULFONATE 15 GM/60ML PO SUSP
30.0000 g | Freq: Once | ORAL | Status: AC
  Administered 2019-05-25: 30 g via ORAL
  Filled 2019-05-25: qty 120

## 2019-05-25 MED ORDER — FENTANYL CITRATE (PF) 100 MCG/2ML IJ SOLN
INTRAMUSCULAR | Status: AC
  Filled 2019-05-25: qty 2

## 2019-05-25 MED ORDER — NOREPINEPHRINE 16 MG/250ML-% IV SOLN
0.0000 ug/min | INTRAVENOUS | Status: DC
  Administered 2019-05-25: 15 ug/min via INTRAVENOUS
  Filled 2019-05-25: qty 250

## 2019-05-25 MED ORDER — SODIUM BICARBONATE-DEXTROSE 150-5 MEQ/L-% IV SOLN
150.0000 meq | INTRAVENOUS | Status: DC
  Administered 2019-05-25 – 2019-05-26 (×3): 150 meq via INTRAVENOUS
  Filled 2019-05-25 (×3): qty 1000

## 2019-05-25 MED ORDER — FENTANYL CITRATE (PF) 100 MCG/2ML IJ SOLN
INTRAMUSCULAR | Status: AC
  Administered 2019-05-25: 12.5 ug via INTRAVENOUS
  Filled 2019-05-25: qty 2

## 2019-05-25 NOTE — Progress Notes (Signed)
  Echocardiogram 2D Echocardiogram has been performed.  Richard Saunders 05/25/2019, 2:08 PM

## 2019-05-25 NOTE — Progress Notes (Signed)
Viewed CXR personally after placement of the HD catheter.  The line obviously does not cross midline and there was no pneumothorax. This was confirmed by radiology. The patients vitals remained within normal limits throughout the procedure and following the procedure. He tolerated the procedure well.   During the procedure there were no complications. The wire was visualized within the internal jugular vein with [place] prior to dilation. The HD cath was also visualized within the vessel with [place] during the procedure. There was good return of dark blood when all three lines were flushed.  Given the location a sample was taken from the line to run an ABG which resulted at 7.24/51/36/22.5 (at 44F) representative of venous blood. A CVP system was connected to the line which demonstrated a pressure of 8-11 also consistent with a venous location.   Plan -CT chest ordered to evaluate location and utility -Will discuss findings with Nephrology  -May need vascular consult if line replacement necessary

## 2019-05-25 NOTE — Progress Notes (Signed)
NAME:  Richard Saunders, MRN:  [id number], DOB:  1940-08-19, LOS: 43 ADMISSION DATE:  05/02/2019, CONSULTATION DATE:  05/15/19 REFERRING MD: Shela Leff, MD  CHIEF COMPLAINT:  Hypotension  Brief History   79 year old male with cirrhosis with SBP- presented with N/V/D and admitted for sepsis secondary to GI etiology. PCCM consulted for hypotension. Following for chronic left pleural effusion.  History of present illness   Richard Saunders is a 79 year old male with the below medical history who presented to the ED for abdominal pain, nausea, vomiting and weakness on 1/13-1/14. He left AMA however his lab results after he left demonstrated elevated lipase 2501. He returned to the ED with similar symptoms as well as new onset diarrhea. Prior to admission he has had poor PO intake, decreased UOP and tachycardia in the last week. His toprolol was increased by his Cardiologist for tachycardia. Of note, he did receive COVID vaccination on 1/13 but states symptoms started prior to this.   In the ED he was hypotensive with BP 85/67. Labs significant for WBC 24.8, K 5.5, BUN/Cr 58/2.6 and LA 4.3. CT A/P with cirrhosis and ascites, pancreatitis and bilateral pleural effusion. ED [place] with no pocket seen for ascites drainage. Given IVF and broad spectrum antibiotics with improvement in hypotension.  1/16, patient with persistent hypotension. Has received total 5.5L overnight and on mIVF 125/hr and was transferred to the ICU, transferred back to the floor on 1/22.  Per nursing report, pt began having dark stool in the afternoon of 1/24 throughout the evening, has remained hypotensive and receive 250cc bolus during the day and 1L overnight..  Hgb dropped 2g from 13 to 11.  Around 7p, stool changed to dark maroon and nursing/patient report at least 5 BM's of dark, red blood. 2 units PRBC's ordered.  INR 2.9 and platelets 161. Heparin gtt stopped at 2026.   Pt sees [facility] and last endoscopy in 2020 with  possible varices.   Past Medical History  Atrial fibrillation on Eliquis, hx of PVS, aortic insufficiency, HTN, non-alcoholic cirrhosis, hx of ascites, anxiety/depression.  Significant Hospital Events   1/13 Presented to ED with N/V/abdominal pain. Left AMA 1/15 Returned to ED 1/24 Admitted to the ICU  Consults:  [facility] PCCM  Procedures:  1/17 para 1/19 para 1/19 thora  Significant Diagnostic Tests:  CT A/P Advanced cirrhosis of the liver, ascites, pancreatitis, bilateral pleural effusions (L>R) Ascitic fluid 1/17: 1,365 WBCs (80% PMNs) Ascitic fluid 1/19: WBC 1015 (79% PMNs), LD 112, albumin <1, protein <3, gram stain WBCs L Pleural fluid 1/19: WBC 835 (47% PMNs), LD 122, albumin <1, protein <3, glucose 86, gram stain WBCs  Upper endoscopy: Impression - Normal larynx. - Grade I esophageal varices. - Non-bleeding gastric ulcer with no stigmata of bleeding. - Acute gastritis. - Non-bleeding duodenal ulcers with pigmented material. - Normal second portion of the duodenum and third portion of the duodenum. - The examination was otherwise normal. - No specimens collected.   Micro Data:  1/15 Sars-Cov-2>> neg 1/15 BCx2>> no growth 1/16 C.diff>> negative 1/17 Ascitic fluid- few WBC >> no growth 1/16 Urine culture >> NG  Antimicrobials:  Vanc 1/15 > 1/15 Cefepime 1/15> 1/17 Flagyl 1/15 >> Ceftriaxone 1/18>>  Interim history/subjective:  Patient denied complaints overnight. Is confused as to the   Objective   Blood pressure 95/62, pulse (!) 40, temperature 97.9 F (36.6 C), temperature source Oral, resp. rate 15, height 6' 1"  (1.854 m), weight 90.5 kg, SpO2  97 %.        Intake/Output Summary (Last 24 hours) at 05/25/2019 1610 Last data filed at 05/25/2019 0500 Gross per 24 hour  Intake 4728.95 ml  Output 525 ml  Net 4203.95 ml   Filed Weights   05/04/2019 1336 05/21/2019 2143 05/19/19 0400  Weight: 85.8 kg 87.4 kg 90.5 kg   Physical Exam: General: A/O x4, in no  acute distress, afebrile, nondiaphoretic HEENT: PEERL, EMO intact Cardio: RRR, no mrg's  Pulmonary: CTA bilaterally, no wheezing or crackles  Abdomen: Bowel sounds normal, abdomin distended, fluid wave present, bedside US with mild/moderate ascites   MSK: BLE nontender, nonedematous Neuro: Alert, CNII-XII grossly intact, conversational Psych: Appropriate affect, not depressed in appearance, engages well Skin: Warm to touch  I/O: Total In 11L, out 3.5L, past 24 hrs in 4L, out [facility] Problem list   Hypoxic respiratory failure, sepsis   Assessment & Plan:  Acute GIB, likely upper in the setting of coagulopathy and heparin gtt Hypotension Etiology of the hypotension uncertain but felt to be due to blood loss with underlying cirrhosis but there is little evidence to support this. Persistent pressor requirements despite aggressive fluid rehydration. Hgb stable. No recurrent hematochezia.  Upper endoscopy with nonbleeding ulceration and G1 esophageal varices found. GI recommending a clear liquid diet for 2 days, continuing present medications, likely discontinuing anticoagulation or antiplatelet agent with his INR being greater than 2 at baseline. He is to teturn to the GI clinic PRN. No aspirin or nonsteroidals until ulcers are healed. Plan  -Echocardiogram ordered -Place central venous access today -Trend CBC for ongoing blood loss daily -Continue peripheral levophed for MAP >60 until central access obtained -S/p albumin 12.5g bolus -Continue clear liquid diet x 2 days with observation -Patient was given Vit K 22m, transfused 2 units FFP and 2 units platelets -Continue protonix Q12 hours IV  AKI Initially felt to be prerenal but there is some concern that in the setting of SBP and GI bleed this could be hepatorenal. Creatinine worse today at 2.5 with GFR 24, and hyperkalemia. BUN climbing to 94 today. UOP still minimal at 275 per last 24 hours. I spoke with Dr. WJustin Mendof  [facility] who agrees that there is a possibility that he will require some form of renal replacement therapy and as such it is likely best that we place a trialysis catheter if central access is needed. He will formally consult and leave formal recommendations when able. Bladder scans unremarkable, no interveneable retention.  Plan -Continue to monitor BMPs and uop -Consider lasix challenge today -UA ordered -Place foley catheter for more accurate I/O's  Acute hypoxic respiratory failure, L pleural effusion Interval worsening of the left sided pleural effusion on 1/26. Likely 2/2 to ascites from cirrhosis. Albumin surprisingly WNL's at 3.6. S/p thoracentesis 1/19. May need Lasix as he is up 3L since admission once his BP stabilizes. Plan -continue supplemental O2, repeat CXR in am after volume resuscitation,  -continue duonebs  Hyperkalemia: Patients potassium slightly elevated the past several days but not as markedly as the morning of 1/25. His labs are all significantly deranged this morning. EKG unremarkable.  Plan -Continue Kayexalate  -Trend CMP daily -Nephrology consulted. Appreciate their recommendations   SBP Ascitic fluid cultures without growth, will need lifelong prophylaxis. WBC increasing but afebrile. BP remains soft which in the setting of cirrhosis with ascites could be portal hypertension. -Continue ceftriaxone and Flagyl -Switch to 160/8051mBactrim at discharge  Afib with RVR -d/c heparin  and hold metoprolol in the setting of the above, HR 80's overnight Plan -resume rate control with beta blocker when able  Best practice:  Diet: Clears Pain/Anxiety/Delirium protocol (if indicated): -- VAP protocol (if indicated): -- DVT prophylaxis: SCD GI prophylaxis: protonix Glucose control: Per primary Mobility: As tolerated Code Status: Full Disposition:  ICU  Labs   CBC: Recent Labs  Lab 05/23/19 0230 05/23/19 0230 05/23/19 2208 05/21/2019 0643 05/25/2019 1019  05/06/2019 1800 05/25/19 0343  WBC 16.8*  --  26.3* 27.1*  --  34.0* 28.4*  NEUTROABS  --   --  20.0*  --   --   --  23.0*  HGB 13.4   < > 11.6* 12.0* 12.1* 13.0 12.3*  HCT 40.3   < > 36.0* 36.3* 36.0* 40.1 37.3*  MCV 108.3*  --  113.9* 104.6*  --  105.2* 105.1*  PLT 126*  --  161 139*  --  172 141*   < > = values in this interval not displayed.   Basic Metabolic Panel: Recent Labs  Lab 05/22/19 0243 05/23/19 0230 05/10/2019 0517 05/23/2019 0815 05/25/19 0343  NA 139 139 139 138 139  K 4.6 4.8 6.2* 5.7* 5.8*  CL 108 106 108 108 110  CO2 20* 24 17* 21* 19*  GLUCOSE 132* 116* 89 109* 132*  BUN 64* 66* 85* 86* 94*  CREATININE 1.26* 1.46* 2.28* 2.38* 2.50*  CALCIUM 10.2 10.3 9.9 9.7 9.4   GFR: Estimated Creatinine Clearance: 27.5 mL/min (A) (by C-G formula based on SCr of 2.5 mg/dL (H)). Recent Labs  Lab 05/23/19 2208 05/13/2019 0517 05/13/2019 0643 05/22/2019 0815 05/13/2019 1800 05/25/19 0343  WBC 26.3*  --  27.1*  --  34.0* 28.4*  LATICACIDVEN  --  3.6*  --  2.0*  --   --    Liver Function Tests: Recent Labs  Lab 05/21/19 0611 05/22/19 0243 05/23/19 0230 05/23/2019 0517 05/25/19 0343  AST 25 26 24  94* 127*  ALT 16 15 15  60* 54*  ALKPHOS 29* 32* 34* 31* 34*  BILITOT 1.2 1.0 1.3* 1.0 2.0*  PROT 6.1* 5.6* 5.5* 5.2* 5.4*  ALBUMIN 4.8 4.2 3.8 3.6 3.7   No results for input(s): LIPASE, AMYLASE in the last 168 hours. Recent Labs  Lab 05/18/19 0943  AMMONIA 25   ABG    Component Value Date/Time   TCO2 24 07/25/2011 1411    Coagulation Profile: Recent Labs  Lab 05/18/19 0656 05/23/19 2208  INR 1.9* 2.6*   Cardiac Enzymes: No results for input(s): CKTOTAL, CKMB, CKMBINDEX, TROPONINI in the last 168 hours.  HbA1C: Hgb A1c MFr Bld  Date/Time Value Ref Range Status  07/29/2016 09:14 AM 5.2 4.6 - 6.5 % Final    Comment:    Glycemic Control Guidelines for People with Diabetes:Non Diabetic:  <6%Goal of Therapy: <7%Additional Action Suggested:  >8%   07/22/2012 09:41  AM 5.5 4.6 - 6.5 % Final    Comment:    Glycemic Control Guidelines for People with Diabetes:Non Diabetic:  <6%Goal of Therapy: <7%Additional Action Suggested:  >8%    CRITICAL CARE  Kathi Ludwig, MD [facility] Resident, PGY-3  Please see Attending A/P and/or Addendum for final recommendations.

## 2019-05-25 NOTE — Procedures (Signed)
Central Venous hemodialysis Catheter Insertion Procedure Note Richard Saunders [id number] February 27, 1941  Procedure: Insertion of Central Venous hemodialysis Catheter Indications: Assessment of intravascular volume, Drug and/or fluid administration and Frequent blood sampling as well as possible dialysis.   Procedure Details Consent: Risks of procedure as well as the alternatives and risks of each were explained to the (patient/caregiver).  Consent for procedure obtained. Time Out: Verified patient identification, verified procedure, site/side was marked, verified correct patient position, special equipment/implants available, medications/allergies/relevent history reviewed, required imaging and test results available.  Performed  Maximum sterile technique was used including antiseptics, cap, gloves, gown, hand hygiene, mask and sheet. Skin prep: Chlorhexidine; local anesthetic administered A antimicrobial bonded/coated triple lumen catheter was placed in the left internal jugular vein using the Seldinger technique.  Evaluation Blood flow good Complications: No immediate complications.  Patient did tolerate procedure well. Chest X-ray ordered to verify placement.  CXR: pending.  Tania Perrott 05/25/2019, 10:51 AM

## 2019-05-25 NOTE — Progress Notes (Signed)
Richard Saunders 1:45 PM  Subjective: Patient doing well without any specific complaints and requests full liquids instead of clear liquids and did have 2 very small bowel movements with a little blood earlier today and case discussed with his nurse as well  Objective: Blood pressure better still on low-dose Levophed no acute distress abdomen is soft nontender hemoglobin okay White count still elevated BUN and creatinine continue to increase renal consulted Assessment: Bleeding from ulcers in a patient with cirrhosis  Plan: We will allow full liquids recheck INR tomorrow can wean octreotide in a day or 2 probable repeat endoscopy if signs of bleeding  Tamika Shropshire E  office [phone] After 5PM or if no answer call [phone]

## 2019-05-25 NOTE — Progress Notes (Signed)
[facility] Progress Note Patient Name: Richard Saunders DOB: 1941/02/26 MRN: [id number]   Date of Service  05/25/2019  HPI/Events of Note  K+ 5.8  eICU Interventions  Kayexalate 30 gm po x 1        Lylee Corrow U Kyrielle Urbanski 05/25/2019, 6:03 AM

## 2019-05-25 NOTE — Consult Note (Signed)
[facility] Renal Consultation Note  Requesting MD:  Indication for Consultation: Acute kidney injury, maintenance of euvolemia, assessment and treatment of metabolic and electrolyte abnormalities, treatment and assessment of acid-base abnormalities.  HPI:   Richard Saunders is a 79 y.o. male.  History of atrial fibrillation, aortic insufficiency, hypertension he also has history of nonalcoholic cirrhosis with ascites, and depression.  Patient was seen initially on 05/12/2019-05/13/2019 and left AGAINST MEDICAL ADVICE.  However results showed elevated lipase of 2501.  He was sent to the emergency room with symptoms of pain and new onset diarrhea.  Decreased p.o. intake.  He received COVID-19 vaccination 05/12/2019  In the emergency room on 05/23/2019 it was noted he was hypotensive with blood pressure 85/67.  CT scan of the abdomen pelvis revealed cirrhosis with ascites pancreatitis and bilateral pleural effusions.  Ultrasound did not reveal any pockets of fluid for ascites drainage.  After having receiving IV fluids he was transferred to the floor 05/21/2019 but was subsequently readmitted to the intensive care unit due to hypotension and drop in hemoglobin.  His renal function has progressively worsened since she has been in the intensive care unit and he continues with voluminous diarrhea.  His urine output has tapered down to 275 cc 05/22/2019 despite significant volume resuscitation with 5.33 L that included 1 unit packed red blood cells  Blood pressure 103/65 pulse 73 temperature 97.9 O2 sats 98% 2 L nasal cannula  IV octreotide IV Protonix IV norepinephrine IV Rocephin 2 g every 24  Sodium 139 potassium 5.8 chloride 110 CO2 19 glucose 132 BUN 94 creatinine 2.5 calcium 9.4 albumin 3.7 AST 27 ALT 54 WBC 28.4 hemoglobin 12.3 platelets 141 AST 127 ALT 54 bilirubin 2  Urinalysis no protein Stool studies were unremarkable  Prior antibiotics Flagyl 500 mg every 8 hours  05/13/2019-05/12/2019 Vancomycin administered 05/15/2019  Progressive layering of left pleural effusion cardiomegaly low lung volumes No acute abdomen intracranial abnormality an MRI single focus of chronic microhemorrhage left temporal lobe 05/17/2019  CT scan 05/11/2019 advanced cirrhosis of liver moderate amount ascites some edema pancreatic peripancreatic fat could go along with low level of pancreatitis aortic atherosclerosis bilateral pleural effusions   Creatinine  Date/Time Value Ref Range Status  07/28/2018 12:00 AM 1.3 0.6 - 1.3 Final   Creatinine, Ser  Date/Time Value Ref Range Status  05/25/2019 03:43 AM 2.50 (H) 0.61 - 1.24 mg/dL Final  05/17/2019 08:15 AM 2.38 (H) 0.61 - 1.24 mg/dL Final  05/26/2019 05:17 AM 2.28 (H) 0.61 - 1.24 mg/dL Final  05/23/2019 02:30 AM 1.46 (H) 0.61 - 1.24 mg/dL Final  05/22/2019 02:43 AM 1.26 (H) 0.61 - 1.24 mg/dL Final  05/21/2019 06:11 AM 1.39 (H) 0.61 - 1.24 mg/dL Final  05/20/2019 02:47 AM 1.46 (H) 0.61 - 1.24 mg/dL Final  05/19/2019 05:02 AM 1.53 (H) 0.61 - 1.24 mg/dL Final  05/18/2019 06:56 AM 1.43 (H) 0.61 - 1.24 mg/dL Final  05/17/2019 07:50 AM 1.56 (H) 0.61 - 1.24 mg/dL Final  05/16/2019 04:11 AM 1.76 (H) 0.61 - 1.24 mg/dL Final  05/15/2019 12:30 AM 2.19 (H) 0.61 - 1.24 mg/dL Final  05/27/2019 01:34 PM 2.69 (H) 0.61 - 1.24 mg/dL Final    Comment:    RESULT REPEATED AND VERIFIED  05/13/2019 12:02 AM 1.42 (H) 0.61 - 1.24 mg/dL Final  10/22/2018 02:36 PM 1.14 0.40 - 1.50 mg/dL Final  10/09/2018 12:59 PM 1.14 0.40 - 1.50 mg/dL Final  10/05/2018 03:57 PM 1.29 0.40 - 1.50 mg/dL Final  06/29/2018 03:05 PM  1.34 (H) 0.61 - 1.24 mg/dL Final  06/23/2018 04:22 AM 1.20 0.61 - 1.24 mg/dL Final  06/22/2018 04:58 AM 1.20 0.61 - 1.24 mg/dL Final  06/21/2018 05:29 AM 1.22 0.61 - 1.24 mg/dL Final  06/20/2018 04:04 AM 1.06 0.61 - 1.24 mg/dL Final  06/19/2018 05:22 AM 1.14 0.61 - 1.24 mg/dL Final  06/18/2018 12:11 PM 1.11 0.61 - 1.24 mg/dL Final   06/11/2018 04:44 PM 1.01 0.40 - 1.50 mg/dL Final  06/05/2018 01:56 PM 0.92 0.40 - 1.50 mg/dL Final  05/31/2018 05:20 AM 1.02 0.61 - 1.24 mg/dL Final  05/29/2018 05:41 AM 1.02 0.61 - 1.24 mg/dL Final  05/28/2018 11:07 AM 1.05 0.61 - 1.24 mg/dL Final  11/24/2017 04:15 PM 1.07 0.61 - 1.24 mg/dL Final  10/16/2017 12:55 PM 0.89 0.40 - 1.50 mg/dL Final  07/29/2016 09:14 AM 0.90 0.40 - 1.50 mg/dL Final  07/22/2012 09:41 AM 1.0 0.4 - 1.5 mg/dL Final  07/25/2011 02:11 PM 1.00 0.50 - 1.35 mg/dL Final  06/11/2010 01:00 PM 1.1 0.4 - 1.5 mg/dL Final  11/25/2006 12:00 AM 0.95 mg/dL      PMHx:   Past Medical History:  Diagnosis Date  . Allergy   . Anxiety   . Aortic insufficiency    a. 04/2018 Echo: EF 55-60%. Mild to Mod MR. Mild AI/TR.  Marland Kitchen Atrial fibrillation ([place])    a. Dx 04/2018. CHA2DS2VASc = 4-->Eliquis.  . Cirrhosis ([place])   . Depression   . H/O: rheumatic fever 2012   (per patient) - eval by [facility] LBBB with PVC's  . History of chicken pox   . Hypertension   . LBBB (left bundle branch block)   . Organic impotence   . PVC's (premature ventricular contractions)     Past Surgical History:  Procedure Laterality Date  . Cautery for nosebleeds    . IR PARACENTESIS  06/19/2018  . Right elbow bursa removed    . TONSILLECTOMY AND ADENOIDECTOMY  1947    Family Hx:  Family History  Problem Relation Age of Onset  . Cancer Mother        ovarian, uterine  . Aneurysm Mother        AAA in 18  . Cancer Father        Prostate  . COPD Father   . Prostate cancer Father   . Diabetes Other   . Heart disease Other        CAD <60 male  . Hypertension Other   . Heart disease Maternal Grandfather        died from possible "heart attack" in early 36's  . Heart failure Maternal Uncle   . Colon cancer Neg Hx     Social History:  reports that he quit smoking about 41 years ago. He quit after 40.00 years of use. He has never used smokeless tobacco. He reports previous alcohol use.  He reports that he does not use drugs.  Allergies:  Allergies  Allergen Reactions  . Colchicine Diarrhea  . Fluticasone Propionate Other (See Comments)    He didn't tolerate the taste/smell prev  . Nasacort [Triamcinolone] Other (See Comments)    Didn't tolerate    Medications: Prior to Admission medications   Medication Sig Start Date End Date Taking? Authorizing Provider  apixaban (ELIQUIS) 5 MG TABS tablet Take 1 tablet (5 mg total) by mouth 2 (two) times daily. 06/02/18 06/24/2019 Yes Fenton, Clint R, PA  cromolyn (OPTICROM) 4 % ophthalmic solution Place 2 drops into both eyes every morning.  05/31/15  Yes [provider]  furosemide (LASIX) 20 MG tablet Take 20 mg by mouth daily.   Yes [provider]  lactulose (CHRONULAC) 10 GM/15ML solution Take 10 g by mouth 2 (two) times daily.  07/29/18  Yes [provider]  metoprolol succinate (TOPROL-XL) 25 MG 24 hr tablet Take 0.5 tablets (12.5 mg total) by mouth 2 (two) times daily. 05/06/19  Yes Belva Crome, MD  spironolactone (ALDACTONE) 50 MG tablet Take 1 tab a day if weight is >185 lbs. If weight 190-195, then take 1 tab twice a day.   If weight >195, then take 1 tab 3 times a day. 10/29/18  Yes Tonia Ghent, MD  ALPRAZolam Duanne Moron) 0.25 MG tablet Take 0.5 tablets (0.125 mg total) by mouth daily as needed for anxiety. Patient not taking: Reported on 05/15/2019 01/27/18   Tonia Ghent, MD  triamcinolone cream (KENALOG) 0.1 % Apply 1 application topically 2 (two) times daily as needed. Patient not taking: Reported on 05/15/2019 10/09/18   Tonia Ghent, MD      Labs:  Results for orders placed or performed during the hospital encounter of 05/11/2019 (from the past 48 hour(s))  Type and screen [place]     Status: None   Collection Time: 05/23/19 10:08 PM  Result Value Ref Range   ABO/RH(D) O POS    Antibody Screen NEG    Sample Expiration 05/26/2019,2359    Unit Number [id number]     Blood Component Type RED CELLS,LR    Unit division 00    Status of Unit ISSUED,FINAL    Transfusion Status OK TO TRANSFUSE    Crossmatch Result      Compatible Performed at [facility], [address], [place], [place] [address]    Unit Number [id number]    Blood Component Type RED CELLS,LR    Unit division 00    Status of Unit ISSUED,FINAL    Transfusion Status OK TO TRANSFUSE    Crossmatch Result Compatible   Prepare RBC     Status: None   Collection Time: 05/23/19 10:08 PM  Result Value Ref Range   Order Confirmation      ORDER PROCESSED BY BLOOD BANK Performed at [facility], [place] [address], [place], [place] [address]   CBC with Differential/Platelet     Status: Abnormal   Collection Time: 05/23/19 10:08 PM  Result Value Ref Range   WBC 26.3 (H) 4.0 - 10.5 K/uL   RBC 3.16 (L) 4.22 - 5.81 MIL/uL   Hemoglobin 11.6 (L) 13.0 - 17.0 g/dL   HCT 36.0 (L) 39.0 - 52.0 %   MCV 113.9 (H) 80.0 - 100.0 fL   MCH 36.7 (H) 26.0 - 34.0 pg   MCHC 32.2 30.0 - 36.0 g/dL   RDW 15.3 11.5 - 15.5 %   Platelets 161 150 - 400 K/uL   nRBC 0.4 (H) 0.0 - 0.2 %   Neutrophils Relative % 75 %   Neutro Abs 20.0 (H) 1.7 - 7.7 K/uL   Lymphocytes Relative 9 %   Lymphs Abs 2.3 0.7 - 4.0 K/uL   Monocytes Relative 10 %   Monocytes Absolute 2.6 (H) 0.1 - 1.0 K/uL   Eosinophils Relative 1 %   Eosinophils Absolute 0.3 0.0 - 0.5 K/uL   Basophils Relative 1 %   Basophils Absolute 0.2 (H) 0.0 - 0.1 K/uL   Immature Granulocytes 4 %   Abs Immature Granulocytes 1.09 (H)  0.00 - 0.07 K/uL    Comment: Performed at [facility], [place] [address], [place], [place] [address]  Protime-INR     Status: Abnormal   Collection Time: 05/23/19 10:08 PM  Result Value Ref Range   Prothrombin Time 27.9 (H) 11.4 - 15.2 seconds   INR 2.6 (H) 0.8 - 1.2    Comment: (NOTE) INR goal varies based on device and disease states. Performed at [facility], [place] [address] [place], [place] [address]    ABO/Rh     Status: None   Collection Time: 05/23/19 10:08 PM  Result Value Ref Range   ABO/RH(D)      O POS Performed at [facility] [address], [place], [place] [address]   Prepare fresh frozen plasma     Status: None   Collection Time: 05/21/2019  2:41 AM  Result Value Ref Range   Unit Number [id number]    Blood Component Type THW PLS APHR    Unit division A0    Status of Unit REL FROM [facility]    Transfusion Status      OK TO TRANSFUSE Performed at [facility], [place] [address], [place], [place] [address]    Unit Number [id number]    Blood Component Type THAWED PLASMA    Unit division 00    Status of Unit ISSUED,FINAL    Transfusion Status OK TO TRANSFUSE   MRSA PCR Screening     Status: None   Collection Time: 05/12/2019  3:23 AM   Specimen: Nasal Mucosa; Nasopharyngeal  Result Value Ref Range   MRSA by PCR NEGATIVE NEGATIVE    Comment:        The GeneXpert MRSA Assay (FDA approved for NASAL specimens only), is one component of a comprehensive MRSA colonization surveillance program. It is not intended to diagnose MRSA infection nor to guide or monitor treatment for MRSA infections. Performed at [facility], [place] [address] [place], [place] [address]   Glucose, capillary     Status: None   Collection Time: 05/03/2019  3:38 AM  Result Value Ref Range   Glucose-Capillary 76 70 - 99 mg/dL  Comprehensive metabolic panel     Status: Abnormal   Collection Time: 05/03/2019  5:17 AM  Result Value Ref Range   Sodium 139 135 - 145 mmol/L   Potassium 6.2 (H) 3.5 - 5.1 mmol/L   Chloride 108 98 - 111 mmol/L   CO2 17 (L) 22 - 32 mmol/L   Glucose, Bld 89 70 - 99 mg/dL   BUN 85 (H) 8 - 23 mg/dL   Creatinine, Ser 2.28 (H) 0.61 - 1.24 mg/dL   Calcium 9.9 8.9 - 10.3 mg/dL   Total Protein 5.2 (L) 6.5 - 8.1 g/dL   Albumin 3.6 3.5 - 5.0 g/dL   AST 94 (H) 15 - 41 U/L   ALT 60 (H) 0 - 44 U/L   Alkaline Phosphatase 31 (L) 38 - 126 U/L   Total Bilirubin 1.0  0.3 - 1.2 mg/dL   GFR calc non Af Amer 26 (L) >60 mL/min   GFR calc Af Amer 31 (L) >60 mL/min   Anion gap 14 5 - 15    Comment: Performed at [facility], [place] [address], [place], [place] [address]  Lactic acid, plasma     Status: Abnormal   Collection Time: 05/03/2019  5:17 AM  Result Value Ref Range   Lactic Acid, Venous 3.6 (HH)  0.5 - 1.9 mmol/L    Comment: CRITICAL VALUE NOTED.  VALUE IS CONSISTENT WITH PREVIOUSLY REPORTED AND CALLED VALUE. CRITICAL RESULT CALLED TO, READ BACK BY AND VERIFIED WITH: R.MICHAEL,RN 05/05/2019 5056 DAVISB Performed at [facility], [place] [address], [place], [place] [address]   CBC     Status: Abnormal   Collection Time: 04/30/2019  6:43 AM  Result Value Ref Range   WBC 27.1 (H) 4.0 - 10.5 K/uL    Comment: REPEATED TO VERIFY WHITE COUNT CONFIRMED ON SMEAR    RBC 3.47 (L) 4.22 - 5.81 MIL/uL   Hemoglobin 12.0 (L) 13.0 - 17.0 g/dL   HCT 36.3 (L) 39.0 - 52.0 %   MCV 104.6 (H) 80.0 - 100.0 fL    Comment: REPEATED TO VERIFY POST TRANSFUSION SPECIMEN    MCH 34.6 (H) 26.0 - 34.0 pg   MCHC 33.1 30.0 - 36.0 g/dL   RDW 19.9 (H) 11.5 - 15.5 %   Platelets 139 (L) 150 - 400 K/uL   nRBC 0.3 (H) 0.0 - 0.2 %    Comment: Performed at [facility], [place] [address], [place], [place] [address]  Basic metabolic panel     Status: Abnormal   Collection Time: 05/22/2019  8:15 AM  Result Value Ref Range   Sodium 138 135 - 145 mmol/L   Potassium 5.7 (H) 3.5 - 5.1 mmol/L   Chloride 108 98 - 111 mmol/L   CO2 21 (L) 22 - 32 mmol/L   Glucose, Bld 109 (H) 70 - 99 mg/dL   BUN 86 (H) 8 - 23 mg/dL   Creatinine, Ser 2.38 (H) 0.61 - 1.24 mg/dL   Calcium 9.7 8.9 - 10.3 mg/dL   GFR calc non Af Amer 25 (L) >60 mL/min   GFR calc Af Amer 29 (L) >60 mL/min   Anion gap 9 5 - 15    Comment: Performed at [facility] [address] [place], [place] [address]  Lactic acid, plasma     Status: Abnormal   Collection Time: 05/19/2019  8:15 AM  Result Value Ref Range    Lactic Acid, Venous 2.0 (HH) 0.5 - 1.9 mmol/L    Comment: CRITICAL VALUE NOTED.  VALUE IS CONSISTENT WITH PREVIOUSLY REPORTED AND CALLED VALUE. Performed at [facility], [place] [address] [place], [place] [address]   Hemoglobin and hematocrit, blood     Status: Abnormal   Collection Time: 05/08/2019 10:19 AM  Result Value Ref Range   Hemoglobin 12.1 (L) 13.0 - 17.0 g/dL   HCT 36.0 (L) 39.0 - 52.0 %    Comment: Performed at [facility] [address], [place], [place] [address]  Glucose, capillary     Status: Abnormal   Collection Time: 05/25/2019 11:18 AM  Result Value Ref Range   Glucose-Capillary 103 (H) 70 - 99 mg/dL  Cortisol     Status: None   Collection Time: 05/27/2019  5:00 PM  Result Value Ref Range   Cortisol, Plasma 38.6 ug/dL    Comment: (NOTE) AM    6.7 - 22.6 ug/dL PM   <10.0       ug/dL Performed at [facility] [address] [place], [place] [address]   CBC     Status: Abnormal   Collection Time: 05/02/2019  6:00 PM  Result Value Ref Range   WBC 34.0 (H) 4.0 - 10.5 K/uL   RBC 3.81 (L) 4.22 - 5.81 MIL/uL  Hemoglobin 13.0 13.0 - 17.0 g/dL   HCT 40.1 39.0 - 52.0 %   MCV 105.2 (H) 80.0 - 100.0 fL   MCH 34.1 (H) 26.0 - 34.0 pg   MCHC 32.4 30.0 - 36.0 g/dL   RDW 21.0 (H) 11.5 - 15.5 %   Platelets 172 150 - 400 K/uL   nRBC 0.5 (H) 0.0 - 0.2 %    Comment: Performed at [facility] [address] [place], [place] [address]  Comprehensive metabolic panel     Status: Abnormal   Collection Time: 05/25/19  3:43 AM  Result Value Ref Range   Sodium 139 135 - 145 mmol/L   Potassium 5.8 (H) 3.5 - 5.1 mmol/L   Chloride 110 98 - 111 mmol/L   CO2 19 (L) 22 - 32 mmol/L   Glucose, Bld 132 (H) 70 - 99 mg/dL   BUN 94 (H) 8 - 23 mg/dL   Creatinine, Ser 2.50 (H) 0.61 - 1.24 mg/dL   Calcium 9.4 8.9 - 10.3 mg/dL   Total Protein 5.4 (L) 6.5 - 8.1 g/dL   Albumin 3.7 3.5 - 5.0 g/dL   AST 127 (H) 15 - 41 U/L   ALT 54 (H) 0 - 44 U/L   Alkaline Phosphatase 34  (L) 38 - 126 U/L   Total Bilirubin 2.0 (H) 0.3 - 1.2 mg/dL   GFR calc non Af Amer 24 (L) >60 mL/min   GFR calc Af Amer 27 (L) >60 mL/min   Anion gap 10 5 - 15    Comment: Performed at [facility], [place] [address], [place], [place] [address]  CBC with Differential/Platelet     Status: Abnormal   Collection Time: 05/25/19  3:43 AM  Result Value Ref Range   WBC 28.4 (H) 4.0 - 10.5 K/uL   RBC 3.55 (L) 4.22 - 5.81 MIL/uL   Hemoglobin 12.3 (L) 13.0 - 17.0 g/dL   HCT 37.3 (L) 39.0 - 52.0 %   MCV 105.1 (H) 80.0 - 100.0 fL   MCH 34.6 (H) 26.0 - 34.0 pg   MCHC 33.0 30.0 - 36.0 g/dL   RDW 20.1 (H) 11.5 - 15.5 %   Platelets 141 (L) 150 - 400 K/uL   nRBC 0.2 0.0 - 0.2 %   Neutrophils Relative % 82 %   Neutro Abs 23.0 (H) 1.7 - 7.7 K/uL   Lymphocytes Relative 6 %   Lymphs Abs 1.8 0.7 - 4.0 K/uL   Monocytes Relative 8 %   Monocytes Absolute 2.2 (H) 0.1 - 1.0 K/uL   Eosinophils Relative 1 %   Eosinophils Absolute 0.3 0.0 - 0.5 K/uL   Basophils Relative 0 %   Basophils Absolute 0.1 0.0 - 0.1 K/uL   Immature Granulocytes 3 %   Abs Immature Granulocytes 0.89 (H) 0.00 - 0.07 K/uL    Comment: Performed at [facility], [place] [address] [place], [place] [address]     ROS:  Unable to obtain patient critically ill intensive care unit Patient continues with copious amounts of diarrhea still   Physical Exam: Vitals:   05/25/19 0830 05/25/19 0845  BP:    Pulse: (!) 53 73  Resp: 19 17  Temp:    SpO2: 97% 97%     General: Alert and oriented nondistressed HEENT: Normocephalic atraumatic external appearance was normal Eyes: Extraocular movements are intact pupils are round equal reactive Neck: Supple JVP not elevated Heart: Regular rate and rhythm no murmurs rubs or gallops Lungs:  Clear to auscultation no wheeze or rales Abdomen: Soft nontender bowel sounds present Extremities: No cyanosis clubbing or edema Skin: Warm to touch Neuro: Grossly  intact  Assessment/Plan: 1.Renal-CT scan 05/25/2019 did not show any evidence of hydronephrosis at that time.  His baseline serum creatinine appears to be about 1.4 mg/dL this improved with hydration during his hospitalization but has continued to worsen.  His urine output appears to be low.  Will check urine sodium.  Urinalysis unremarkable with no evidence of any proteinuria.  Still having significant amounts of diarrhea did receive 5 L volume resuscitation 05/23/2019.  This looks as though this could be either prerenal azotemia, acute tubular necrosis or hepatorenal syndrome.  Have discussed with critical care medicine and they will measure her CVP.  I am in agreement with this approach .we will send urine for urine microscopy and will also order renal ultrasound 2. Hypertension/volume  -aggressively volume resuscitate patient at this point and will start IV fluids at 125 cc an hour 3.  Metabolic acidosis we will use IV fluids D5W with 3 A of bicarb 125 cc an hour 4.  Hyperkalemia we will continue to follow this patient is not receiving any potassium supplementation no ACE inhibitor's no ARB's.  Will treat metabolic acidosis.  Continue to follow potassium.  Patient may need dialysis if no improvement in next 24 hours have discussed with critical care medicine. 5.  Cirrhosis possibility that this could be hepatorenal syndrome with ascites pleural effusions third spacing.  Will check urine sodium.  Agree with empiric octreotide and as needed Levophed.  Prognosis may be guarded. 6.  Anemia with drop in hemoglobin and hypotension no obvious signs of GI blood loss.  High risk for varices.  Peptic ulcer disease.  Agree with IV proton pump inhibitor.  We will continue to follow 7.  Continues on Rocephin as SBP prophylaxis. 8.  Acute diarrhea no infectious etiology has been discovered at this point.   Sherril Croon 05/25/2019, 9:24 AM

## 2019-05-26 ENCOUNTER — Inpatient Hospital Stay (HOSPITAL_COMMUNITY): Payer: Medicare HMO | Admitting: Certified Registered"

## 2019-05-26 ENCOUNTER — Inpatient Hospital Stay (HOSPITAL_COMMUNITY): Payer: Medicare HMO

## 2019-05-26 ENCOUNTER — Encounter: Payer: Self-pay | Admitting: *Deleted

## 2019-05-26 LAB — GLUCOSE, PLEURAL OR PERITONEAL FLUID: Glucose, Fluid: 179 mg/dL

## 2019-05-26 LAB — PROTIME-INR
INR: 2.4 — ABNORMAL HIGH (ref 0.8–1.2)
Prothrombin Time: 26.4 seconds — ABNORMAL HIGH (ref 11.4–15.2)

## 2019-05-26 LAB — COMPREHENSIVE METABOLIC PANEL
ALT: 65 U/L — ABNORMAL HIGH (ref 0–44)
AST: 171 U/L — ABNORMAL HIGH (ref 15–41)
Albumin: 3.4 g/dL — ABNORMAL LOW (ref 3.5–5.0)
Alkaline Phosphatase: 37 U/L — ABNORMAL LOW (ref 38–126)
Anion gap: 9 (ref 5–15)
BUN: 94 mg/dL — ABNORMAL HIGH (ref 8–23)
CO2: 23 mmol/L (ref 22–32)
Calcium: 9.2 mg/dL (ref 8.9–10.3)
Chloride: 107 mmol/L (ref 98–111)
Creatinine, Ser: 2.68 mg/dL — ABNORMAL HIGH (ref 0.61–1.24)
GFR calc Af Amer: 25 mL/min — ABNORMAL LOW (ref >60)
GFR calc non Af Amer: 22 mL/min — ABNORMAL LOW (ref >60)
Glucose, Bld: 173 mg/dL — ABNORMAL HIGH (ref 70–99)
Potassium: 4.8 mmol/L (ref 3.5–5.1)
Sodium: 139 mmol/L (ref 135–145)
Total Bilirubin: 1.5 mg/dL — ABNORMAL HIGH (ref 0.3–1.2)
Total Protein: 5 g/dL — ABNORMAL LOW (ref 6.5–8.1)

## 2019-05-26 LAB — BODY FLUID CELL COUNT WITH DIFFERENTIAL
Lymphs, Fluid: 31 %
Monocyte-Macrophage-Serous Fluid: 21 % — ABNORMAL LOW (ref 50–90)
Neutrophil Count, Fluid: 48 % — ABNORMAL HIGH (ref 0–25)
Total Nucleated Cell Count, Fluid: 180 cu mm (ref 0–1000)

## 2019-05-26 LAB — RENAL FUNCTION PANEL
Albumin: 3.5 g/dL (ref 3.5–5.0)
Anion gap: 9 (ref 5–15)
BUN: 74 mg/dL — ABNORMAL HIGH (ref 8–23)
CO2: 23 mmol/L (ref 22–32)
Calcium: 8.6 mg/dL — ABNORMAL LOW (ref 8.9–10.3)
Chloride: 106 mmol/L (ref 98–111)
Creatinine, Ser: 2.17 mg/dL — ABNORMAL HIGH (ref 0.61–1.24)
GFR calc Af Amer: 33 mL/min — ABNORMAL LOW (ref >60)
GFR calc non Af Amer: 28 mL/min — ABNORMAL LOW (ref >60)
Glucose, Bld: 139 mg/dL — ABNORMAL HIGH (ref 70–99)
Phosphorus: 3.5 mg/dL (ref 2.5–4.6)
Potassium: 4.8 mmol/L (ref 3.5–5.1)
Sodium: 138 mmol/L (ref 135–145)

## 2019-05-26 LAB — CBC
HCT: 36.2 % — ABNORMAL LOW (ref 39.0–52.0)
Hemoglobin: 11.9 g/dL — ABNORMAL LOW (ref 13.0–17.0)
MCH: 34.2 pg — ABNORMAL HIGH (ref 26.0–34.0)
MCHC: 32.9 g/dL (ref 30.0–36.0)
MCV: 104 fL — ABNORMAL HIGH (ref 80.0–100.0)
Platelets: 122 10*3/uL — ABNORMAL LOW (ref 150–400)
RBC: 3.48 MIL/uL — ABNORMAL LOW (ref 4.22–5.81)
RDW: 19.8 % — ABNORMAL HIGH (ref 11.5–15.5)
WBC: 26.4 10*3/uL — ABNORMAL HIGH (ref 4.0–10.5)
nRBC: 0.5 % — ABNORMAL HIGH (ref 0.0–0.2)

## 2019-05-26 LAB — PROTEIN, PLEURAL OR PERITONEAL FLUID: Total protein, fluid: <3 g/dL

## 2019-05-26 LAB — LACTATE DEHYDROGENASE: LDH: 305 U/L — ABNORMAL HIGH (ref 98–192)

## 2019-05-26 LAB — ALBUMIN, PLEURAL OR PERITONEAL FLUID: Albumin, Fluid: <1 g/dL

## 2019-05-26 LAB — GLUCOSE, CAPILLARY: Glucose-Capillary: 108 mg/dL — ABNORMAL HIGH (ref 70–99)

## 2019-05-26 LAB — LACTATE DEHYDROGENASE, PLEURAL OR PERITONEAL FLUID: LD, Fluid: 57 U/L — ABNORMAL HIGH (ref 3–23)

## 2019-05-26 MED ORDER — SODIUM CHLORIDE 0.9 % FOR CRRT
INTRAVENOUS_CENTRAL | Status: DC | PRN
  Filled 2019-05-26 (×2): qty 1000

## 2019-05-26 MED ORDER — ALBUMIN HUMAN 5 % IV SOLN
12.5000 g | Freq: Once | INTRAVENOUS | Status: AC
  Administered 2019-05-26: 12.5 g via INTRAVENOUS
  Filled 2019-05-26: qty 250

## 2019-05-26 MED ORDER — PHENYLEPHRINE CONCENTRATED 100MG/250ML (0.4 MG/ML) INFUSION SIMPLE
0.0000 ug/min | INTRAVENOUS | Status: DC
  Administered 2019-05-26: 220 ug/min via INTRAVENOUS
  Administered 2019-05-27 (×3): 400 ug/min via INTRAVENOUS
  Administered 2019-05-27: 380 ug/min via INTRAVENOUS
  Administered 2019-05-27 – 2019-05-28 (×5): 400 ug/min via INTRAVENOUS
  Filled 2019-05-26 (×12): qty 250

## 2019-05-26 MED ORDER — PRISMASOL BGK 4/2.5 32-4-2.5 MEQ/L IV SOLN
INTRAVENOUS | Status: DC
  Filled 2019-05-26 (×30): qty 5000

## 2019-05-26 MED ORDER — HEPARIN SODIUM (PORCINE) 1000 UNIT/ML DIALYSIS
1000.0000 [IU] | INTRAMUSCULAR | Status: DC | PRN
  Administered 2019-05-26: 3200 [IU] via INTRAVENOUS_CENTRAL
  Filled 2019-05-26 (×2): qty 6
  Filled 2019-05-26: qty 3
  Filled 2019-05-26: qty 4

## 2019-05-26 MED ORDER — PHENYLEPHRINE HCL-NACL 10-0.9 MG/250ML-% IV SOLN
0.0000 ug/min | INTRAVENOUS | Status: DC
  Administered 2019-05-26: 10 ug/min via INTRAVENOUS
  Administered 2019-05-26: 120 ug/min via INTRAVENOUS
  Administered 2019-05-26: 190 ug/min via INTRAVENOUS
  Administered 2019-05-26: 13.333 ug/min via INTRAVENOUS
  Filled 2019-05-26 (×4): qty 250

## 2019-05-26 MED ORDER — VASOPRESSIN 20 UNIT/ML IV SOLN
0.0400 [IU]/min | INTRAVENOUS | Status: DC
  Administered 2019-05-26: 0.03 [IU]/min via INTRAVENOUS
  Administered 2019-05-27 – 2019-05-28 (×2): 0.04 [IU]/min via INTRAVENOUS
  Filled 2019-05-26 (×5): qty 2

## 2019-05-26 MED ORDER — ORAL CARE MOUTH RINSE
15.0000 mL | Freq: Two times a day (BID) | OROMUCOSAL | Status: DC
  Administered 2019-05-26 – 2019-05-27 (×3): 15 mL via OROMUCOSAL

## 2019-05-26 MED ORDER — HYDROMORPHONE HCL 1 MG/ML IJ SOLN
0.5000 mg | Freq: Once | INTRAMUSCULAR | Status: AC
  Administered 2019-05-26: 0.5 mg via INTRAVENOUS
  Filled 2019-05-26: qty 0.5

## 2019-05-26 MED ORDER — MORPHINE SULFATE (PF) 2 MG/ML IV SOLN: 2.0000 mg | Freq: Once | INTRAVENOUS | Status: DC

## 2019-05-26 MED ORDER — PRISMASOL BGK 4/2.5 32-4-2.5 MEQ/L REPLACEMENT SOLN
Status: DC
  Filled 2019-05-26 (×7): qty 5000

## 2019-05-26 MED ORDER — PRISMASOL BGK 4/2.5 32-4-2.5 MEQ/L REPLACEMENT SOLN
Status: DC
Start: 2019-05-26 — End: 2019-05-28
  Filled 2019-05-26 (×7): qty 5000

## 2019-05-26 NOTE — Procedures (Addendum)
Thoracentesis Procedure Note  Pre-operative Diagnosis: Left sided pleural effusion  Post-operative Diagnosis: same  Indications: Therapeutic and diagnostic  Procedure Details  Consent: Informed consent was obtained. Risks of the procedure were discussed including: infection, bleeding, pain, pneumothorax.  Under sterile conditions the patient was positioned. Betadine solution and sterile drapes were utilized.  1% plain lidocaine was used to anesthetize the 10th rib space. Fluid was obtained without any difficulties and minimal blood loss.  A dressing was applied to the wound and wound care instructions were provided.   Findings One liter yellowish pleural fluid was obtained. A sample was sent to Pathology for cytogenetics, flow, and cell counts, as well as for infection analysis.  Complications:  None; patient tolerated the procedure well.          Condition: stable  Plan A follow up chest x-ray was ordered. Bed Rest for 2 hours. Tylenol 650 mg. for pain.

## 2019-05-26 NOTE — Progress Notes (Signed)
[name] 12:10 PM  Subjective: The patient has no signs of bleeding and is not eating much but no other specific GI complaints and his case discussed with the medicine team and is about to have a thoracentesis  Objective: Vital signs stable afebrile no acute distress blood pressure significantly better abdomen is soft nontender hemoglobin okay creatinine slight worse  Assessment: Seemingly resolved GI bleeding from duodenal ulcers in patient with cirrhosis but multiple other medical issues  Plan: Okay to wean octreotide per pharmacy protocol and will check on tomorrow  [facility]  office [phone] After 5PM or if no answer call [phone]

## 2019-05-26 NOTE — Progress Notes (Addendum)
[facility] ROUNDING NOTE   Subjective:   Is a 79 year old gentleman history aortic insufficiency, atrial fibrillation, hypertension nonalcoholic cirrhosis and ascites.  He had left the hospital [place] after a stay 1 night 05/12/2019 to 05/13/2019 but return to the emergency room 05/23/2019 with hypotension and a CT scan that was consistent with acute pancreatitis.  He has had progressively worse renal function despite aggressive volume resuscitation.  His ultrasound of his kidneys showed small atrophic right kidney with increased echogenicity of the right renal parenchyma and nonvisualization of the left kidney and hepatic cirrhosis with intra-abdominal ascites.  05/25/2019.  He also underwent CT scan of his chest that showed atelectasis and collapse of the left lower lobe and lingula hepatic cirrhosis with moderate volume intra-abdominal ascites and age indeterminant compression deformity of the superior endplate of T3.  Blood pressure was 90/71 pulse 128 irregular temperature 97 O2 sats 91% on room air.  Sodium 139 potassium 4.8 chloride 107 CO2 23 BUN 94 creatinine 2.68 glucose 173 calcium 9.2 AST 171 ALT 65 WBC 26.4 hemoglobin 11.9 platelets 122  Rocephin 2 g every 24 hours IV norepinephrine IV Protonix IV sodium bicarbonate 125 cc an hour IV octreotide  Urine output 140 cc 05/25/2019  Urinalysis was bland with no white cells no red blood cells 05/25/2019 urine sodium less than 10    Objective:  Vital signs in last 24 hours:  Temp:  [91.9 F (33.3 C)-97.9 F (36.6 C)] 97.7 F (36.5 C) (01/27 0500) Pulse Rate:  [25-128] 105 (01/27 0500) Resp:  [14-25] 19 (01/27 0500) BP: (81-109)/(54-80) 107/80 (01/27 0400) SpO2:  [91 %-100 %] 91 % (01/27 0500) Weight:  [103.2 kg] 103.2 kg (01/27 0400)  Weight change:  Filed Weights   05/11/2019 2143 05/19/19 0400 05/26/19 0400  Weight: 87.4 kg 90.5 kg 103.2 kg    Intake/Output: I/O last 3 completed shifts: In:  70 [P.O.:70; I.V.:5115.9; IV Piggyback:150.1] Out: 440 [Urine:440]   Intake/Output this shift:  No intake/output data recorded.  Nondistressed resting comfortably periorbital edema CVS-tachycardic irregular rate and rhythm systolic murmur left sternal edge RS-anterior lung fields were clear ABD-hypoactive bowel sounds with ascites clinically EXT-1+ pitting edema   Basic Metabolic Panel: Recent Labs  Lab 05/23/19 0230 05/23/19 0230 05/12/2019 0517 05/04/2019 0517 05/26/2019 0815 05/25/19 0343 05/25/19 1207 05/26/19 0425  NA 139   < > 139  --  138 139 140 139  K 4.8   < > 6.2*  --  5.7* 5.8* 5.5* 4.8  CL 106  --  108  --  108 110  --  107  CO2 24  --  17*  --  21* 19*  --  23  GLUCOSE 116*  --  89  --  109* 132*  --  173*  BUN 66*  --  85*  --  86* 94*  --  94*  CREATININE 1.46*  --  2.28*  --  2.38* 2.50*  --  2.68*  CALCIUM 10.3   < > 9.9   < > 9.7 9.4  --  9.2   < > = values in this interval not displayed.    Liver Function Tests: Recent Labs  Lab 05/22/19 0243 05/23/19 0230 05/06/2019 0517 05/25/19 0343 05/26/19 0425  AST 26 24 94* 127* 171*  ALT 15 15 60* 54* 65*  ALKPHOS 32* 34* 31* 34* 37*  BILITOT 1.0 1.3* 1.0 2.0* 1.5*  PROT 5.6* 5.5* 5.2* 5.4* 5.0*  ALBUMIN 4.2 3.8 3.6 3.7 3.4*  No results for input(s): LIPASE, AMYLASE in the last 168 hours. No results for input(s): AMMONIA in the last 168 hours.  CBC: Recent Labs  Lab 05/23/19 2208 05/23/19 2208 05/01/2019 0643 05/11/2019 0643 05/23/2019 1019 05/15/2019 1800 05/25/19 0343 05/25/19 1207 05/26/19 0425  WBC 26.3*  --  27.1*  --   --  34.0* 28.4*  --  26.4*  NEUTROABS 20.0*  --   --   --   --   --  23.0*  --   --   HGB 11.6*   < > 12.0*   < > 12.1* 13.0 12.3* 11.9* 11.9*  HCT 36.0*   < > 36.3*   < > 36.0* 40.1 37.3* 35.0* 36.2*  MCV 113.9*  --  104.6*  --   --  105.2* 105.1*  --  104.0*  PLT 161  --  139*  --   --  172 141*  --  122*   < > = values in this interval not displayed.    Cardiac  Enzymes: No results for input(s): CKTOTAL, CKMB, CKMBINDEX, TROPONINI in the last 168 hours.  BNP: Invalid input(s): POCBNP  CBG: Recent Labs  Lab 05/07/2019 0338 05/05/2019 1118  GLUCAP 76 103*    Microbiology: Results for orders placed or performed during the hospital encounter of 05/26/2019  Blood Culture (routine x 2)     Status: None   Collection Time: 05/13/2019  2:10 PM   Specimen: BLOOD  Result Value Ref Range Status   Specimen Description BLOOD RIGHT ANTECUBITAL  Final   Special Requests   Final    BOTTLES DRAWN AEROBIC ONLY Blood Culture results may not be optimal due to an inadequate volume of blood received in culture bottles   Culture   Final    NO GROWTH 5 DAYS Performed at [facility], [place] [address], [place], [place] [address]    Report Status 05/19/2019 FINAL  Final  Blood Culture (routine x 2)     Status: None   Collection Time: 05/03/2019  2:10 PM   Specimen: BLOOD  Result Value Ref Range Status   Specimen Description BLOOD LEFT ANTECUBITAL  Final   Special Requests   Final    BOTTLES DRAWN AEROBIC AND ANAEROBIC Blood Culture adequate volume   Culture   Final    NO GROWTH 5 DAYS Performed at [facility], [place] [address], [place], [place] [address]    Report Status 05/19/2019 FINAL  Final  Respiratory Panel by RT PCR (Flu A&B, Covid) - Nasopharyngeal Swab     Status: None   Collection Time: 05/26/2019  2:20 PM   Specimen: Nasopharyngeal Swab  Result Value Ref Range Status   SARS Coronavirus 2 by RT PCR NEGATIVE NEGATIVE Final    Comment: (NOTE) SARS-CoV-2 target nucleic acids are NOT DETECTED. The SARS-CoV-2 RNA is generally detectable in upper respiratoy specimens during the acute phase of infection. The lowest concentration of SARS-CoV-2 viral copies this assay can detect is 131 copies/mL. A negative result does not preclude SARS-Cov-2 infection and should not be used as the sole basis for treatment or other patient management decisions. A  negative result may occur with  improper specimen collection/handling, submission of specimen other than nasopharyngeal swab, presence of viral mutation(s) within the areas targeted by this assay, and inadequate number of viral copies (<131 copies/mL). A negative result must be combined with clinical observations, patient history, and epidemiological information. The expected result is Negative. Fact Sheet for Patients:  PinkCheek.be Fact Sheet for Healthcare Providers:  GravelBags.it This test is not yet ap proved or cleared by the [place] FDA and  has been authorized for detection and/or diagnosis of SARS-CoV-2 by FDA under an Emergency Use Authorization (EUA). This EUA will remain  in effect (meaning this test can be used) for the duration of the COVID-19 declaration under Section 564(b)(1) of the Act, 21 U.S.C. section 360bbb-3(b)(1), unless the authorization is terminated or revoked sooner.    Influenza A by PCR NEGATIVE NEGATIVE Final   Influenza B by PCR NEGATIVE NEGATIVE Final    Comment: (NOTE) The Xpert Xpress SARS-CoV-2/FLU/RSV assay is intended as an aid in  the diagnosis of influenza from Nasopharyngeal swab specimens and  should not be used as a sole basis for treatment. Nasal washings and  aspirates are unacceptable for Xpert Xpress SARS-CoV-2/FLU/RSV  testing. Fact Sheet for Patients: PinkCheek.be Fact Sheet for Healthcare Providers: GravelBags.it This test is not yet approved or cleared by the [place] FDA and  has been authorized for detection and/or diagnosis of SARS-CoV-2 by  FDA under an Emergency Use Authorization (EUA). This EUA will remain  in effect (meaning this test can be used) for the duration of the  Covid-19 declaration under Section 564(b)(1) of the Act, 21  U.S.C. section 360bbb-3(b)(1), unless the authorization is   terminated or revoked. Performed at [facility], [place] [address], [place], [place] [address]   C difficile quick scan w PCR reflex     Status: None   Collection Time: 05/15/19  1:35 AM   Specimen: STOOL  Result Value Ref Range Status   C Diff antigen NEGATIVE NEGATIVE Final   C Diff toxin NEGATIVE NEGATIVE Final   C Diff interpretation No C. difficile detected.  Final    Comment: Performed at [facility], [place] [address] [place], [place] [address]  GI pathogen panel by PCR, stool     Status: None   Collection Time: 05/15/19  1:35 AM   Specimen: Stool  Result Value Ref Range Status   Plesiomonas shigelloides NOT DETECTED NOT DETECTED Final   Yersinia enterocolitica NOT DETECTED NOT DETECTED Final   Vibrio NOT DETECTED NOT DETECTED Final   Enteropathogenic E coli NOT DETECTED NOT DETECTED Final   E coli (ETEC) LT/ST NOT DETECTED NOT DETECTED Final   E coli 3382 by PCR Not applicable NOT DETECTED Final   Cryptosporidium by PCR NOT DETECTED NOT DETECTED Final   Entamoeba histolytica NOT DETECTED NOT DETECTED Final   Adenovirus F 40/41 NOT DETECTED NOT DETECTED Final   Norovirus GI/GII NOT DETECTED NOT DETECTED Final   Sapovirus NOT DETECTED NOT DETECTED Final    Comment: (NOTE) Performed At: [facility] [place], [place] [id number] Rush Farmer MD AL:[id number]    Vibrio cholerae NOT DETECTED NOT DETECTED Final   Campylobacter by PCR NOT DETECTED NOT DETECTED Final   Salmonella by PCR NOT DETECTED NOT DETECTED Final   E coli (STEC) NOT DETECTED NOT DETECTED Final   Enteroaggregative E coli NOT DETECTED NOT DETECTED Final   Shigella by PCR NOT DETECTED NOT DETECTED Final   Cyclospora cayetanensis NOT DETECTED NOT DETECTED Final   Astrovirus NOT DETECTED NOT DETECTED Final   G lamblia by PCR NOT DETECTED NOT DETECTED Final   Rotavirus A by PCR NOT DETECTED NOT DETECTED Final  Urine culture     Status: None   Collection Time: 05/15/19  9:10  AM   Specimen: In/Out  Cath Urine  Result Value Ref Range Status   Specimen Description IN/OUT CATH URINE  Final   Special Requests NONE  Final   Culture   Final    NO GROWTH Performed at [facility], [address] [place], [place] [address]    Report Status 05/16/2019 FINAL  Final  Body fluid culture     Status: None   Collection Time: 05/16/19  3:12 PM   Specimen: Peritoneal Washings; Body Fluid  Result Value Ref Range Status   Specimen Description PERITONEAL  Final   Special Requests Normal  Final   Gram Stain   Final    FEW WBC PRESENT,BOTH PMN AND MONONUCLEAR NO ORGANISMS SEEN    Culture   Final    NO GROWTH 3 DAYS Performed at [facility], [address], [place], [place] [address]    Report Status 05/19/2019 FINAL  Final  Body fluid culture     Status: None   Collection Time: 05/18/19  5:29 PM   Specimen: Body Fluid  Result Value Ref Range Status   Specimen Description FLUID PARACENTESIS  Final   Special Requests NONE  Final   Gram Stain   Final    FEW WBC PRESENT, PREDOMINANTLY PMN NO ORGANISMS SEEN    Culture   Final    NO GROWTH Performed at [facility], [address], [place], [place] [address]    Report Status 05/22/2019 FINAL  Final  Body fluid culture     Status: None   Collection Time: 05/18/19  5:31 PM   Specimen: Body Fluid  Result Value Ref Range Status   Specimen Description FLUID LEFT PLEURAL  Final   Special Requests NONE  Final   Gram Stain   Final    FEW WBC PRESENT,BOTH PMN AND MONONUCLEAR NO ORGANISMS SEEN    Culture   Final    NO GROWTH Performed at [facility], [address] [place], [place] [address]    Report Status 05/21/2019 FINAL  Final  MRSA PCR Screening     Status: None   Collection Time: 05/05/2019  3:23 AM   Specimen: Nasal Mucosa; Nasopharyngeal  Result Value Ref Range Status   MRSA by PCR NEGATIVE NEGATIVE Final    Comment:        The GeneXpert MRSA Assay (FDA approved for NASAL  specimens only), is one component of a comprehensive MRSA colonization surveillance program. It is not intended to diagnose MRSA infection nor to guide or monitor treatment for MRSA infections. Performed at [facility], [place] [address] [place], [place] [address]     Coagulation Studies: Recent Labs    05/23/19 June 26, 2206 05/26/19 0425  LABPROT 27.9* 26.4*  INR 2.6* 2.4*    Urinalysis: Recent Labs    05/25/19 0904  COLORURINE AMBER*  LABSPEC 1.018  PHURINE 5.0  GLUCOSEU NEGATIVE  HGBUR NEGATIVE  BILIRUBINUR NEGATIVE  KETONESUR NEGATIVE  PROTEINUR 30*  NITRITE NEGATIVE  LEUKOCYTESUR NEGATIVE      Imaging: CT CHEST WO CONTRAST  Result Date: 05/25/2019 CLINICAL DATA:  Central venous catheter placement with chest radiograph concerning for malpositioning. Acquired ABG from the catheter demonstrates a venous positioning with CVP measurement of 8 to 10. EXAM: CT CHEST WITHOUT CONTRAST TECHNIQUE: Multidetector CT imaging of the chest was performed following the standard protocol without IV contrast. 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 No definite evidence of partial anomalous venous return on this noncontrast examination. Normal heart size. Coronary artery calcifications. No pericardial effusion. Normal caliber of the thoracic aorta. Bovine configuration of the aortic arch. Mediastinum/Nodes: No bulky mediastinal, hilar axillary lymphadenopathy on this noncontrast examination. Lungs/Pleura: Small left-sided pleural effusion with associated consolidation/collapse involving the left lower lobe and lingula with associated air bronchograms. Trace right-sided pleural effusion with associated right lower lobe  atelectasis/collapse and air bronchograms. The remaining central pulmonary airways appear widely patent. No pneumothorax. Note is made of a punctate granuloma within the right upper lobe (image 31, series 5). Upper Abdomen: Limited noncontrast evaluation of the upper abdomen demonstrates marked nodularity of the hepatic contour with moderate volume intra-abdominal ascites. No evidence of splenomegaly. Musculoskeletal: Age-indeterminate though presumably chronic mild (approximately 25%) compression deformity involving the superior endplate of the T3 vertebral body without associated fracture line, retropulsion or definitive paraspinal hematoma. Mild-to-moderate multilevel DDD within the cervical spine. Mild diffuse body wall anasarca.  Symmetric bilateral gynecomastia. IMPRESSION: 1. 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 No evidence of partial anomalous pulmonary venous return on this noncontrast examination. No pneumothorax. 2. Complete atelectasis/collapse of the left lower lobe and lingula with partial atelectasis/collapse of the right lower lobe with associated air bronchograms. Concomitant infection not excluded on the basis of this examination. 3. Hepatic cirrhosis with moderate volume intra-abdominal ascites. No evidence of splenomegaly. 4. Age-indeterminate mild (approximately 25%) compression deformity involving the superior endplate of the T3 vertebral body. Correlation for point tenderness at this location is advised. Electronically Signed   By: Sandi Mariscal M.D.   On: 05/25/2019 16:23   US RENAL  Result Date: 05/25/2019 CLINICAL DATA:  Initial evaluation for acute renal insufficiency. EXAM: RENAL / URINARY TRACT ULTRASOUND COMPLETE COMPARISON:  Prior CT from earlier the same day. FINDINGS: Right Kidney: Renal measurements: 9.4 x 4.5 x 4.0 cm = volume: 88.8 mL. Right kidney is small and atrophic in appearance with  increased echogenicity within the renal parenchyma, compatible with chronic medical renal disease. No visible nephrolithiasis or hydronephrosis. No focal renal mass. Left Kidney: Not visualized. Bladder: Appears normal for degree of bladder distention. Other: Hepatic cirrhosis with intra-abdominal ascites partially visualized. IMPRESSION: 1. Small and atrophic right kidney with increased echogenicity within the right renal parenchyma, compatible with chronic medical renal disease. No hydronephrosis. 2. Nonvisualization of the left kidney. 3. Hepatic cirrhosis with intra-abdominal ascites, partially visualized. Electronically Signed   By: Jeannine Boga M.D.   On: 05/25/2019 23:21   DG CHEST PORT 1 VIEW  Result Date: 05/25/2019 CLINICAL DATA:  Central line placement EXAM: PORTABLE CHEST 1 VIEW COMPARISON:  05/05/2019 FINDINGS: Interval placement of a dual lumen central venous catheter from the left neck. Catheter is malposition descending along the left hemithorax. Heart size is stable. Lung volumes are low. Left-sided effusion with hazy opacity. No discernible pneumothorax. IMPRESSION: 1. Malpositioned left-sided central venous catheter. It is indeterminate whether the catheter is within a anomalous vein or within the descending thoracic aorta via the left subclavian artery. 2. Left-sided effusion with hazy opacity. 3. No discernible pneumothorax. These results will be called to the ordering clinician or representative by the Radiologist Assistant, and communication documented in the PACS or zVision Dashboard. Electronically Signed   By: Davina Poke D.O.  On: 05/25/2019 12:09   ECHOCARDIOGRAM COMPLETE  Result Date: 05/25/2019   ECHOCARDIOGRAM REPORT   Patient Name:   WILVER TIGNOR Date of Exam: 05/25/2019 Medical Rec #:  [id number]         Height:       73.0 in Accession #:    [id number]        Weight:       199.5 lb Date of Birth:  31-Aug-1940        BSA:          2.15 m Patient Age:    40  years          BP:           95/62 mmHg Patient Gender: M                 HR:           80 bpm. Exam Location:  Inpatient Procedure: 2D Echo, Cardiac Doppler and Color Doppler Indications:    Hypotension  History:        Patient has prior history of Echocardiogram examinations, most                 recent 05/29/2018. Arrythmias:Atrial Fibrillation and                 Tachycardia; Signs/Symptoms:Hypotension. Sepsis, GI bleed,                 pleural effusion, Resp. Failure.  Sonographer:    Dustin Flock Referring Phys: [phone] VINEET SOOD  Sonographer Comments: Suboptimal parasternal window and Technically difficult study due to poor echo windows. No echo windows. IMPRESSIONS  1. Left ventricular ejection fraction, by visual estimation, is 50 to 55%. The left ventricle has low normal function. There is no left ventricular hypertrophy.  2. Abnormal septal motion consistent with left bundle branch block.  3. The left ventricle has no regional wall motion abnormalities.  4. Global right ventricle has normal systolic function.The right ventricular size is normal. No increase in right ventricular wall thickness.  5. Left atrial size was mildly dilated.  6. Right atrial size was normal.  7. Moderate anterior leaflet systolic anterior motion of the mitral valve.  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  9. Despite the absence of septal hypertrophy, the highly redundant anterior mitral leaflet exhibitis marked systolic anterior motion. There is mild outflow tract obstruction and secondary mild-moderate mitral insufficiency. 10. The tricuspid valve is myxomatous. 11. The tricuspid valve is myxomatous. Tricuspid valve regurgitation is mild. 12. The aortic valve is normal in structure. Aortic valve regurgitation is not visualized. 13. The pulmonic valve was not well visualized. Pulmonic valve regurgitation is not visualized. 14. Normal pulmonary artery systolic pressure. FINDINGS  Left  Ventricle: Left ventricular ejection fraction, by visual estimation, is 50 to 55%. The left ventricle has low normal function. The left ventricle has no regional wall motion abnormalities. There is no left ventricular hypertrophy. Abnormal (paradoxical) septal motion, consistent with left bundle branch block. Right Ventricle: The right ventricular size is normal. No increase in right ventricular wall thickness. Global RV systolic function is has normal systolic function. The tricuspid regurgitant velocity is 2.54 m/s, and with an assumed right atrial pressure  of 3 mmHg, the estimated right ventricular systolic pressure is normal at 28.8 mmHg. Left Atrium: Left atrial size was mildly dilated. Right Atrium: Right atrial size was normal in size Pericardium: There is no evidence of pericardial  effusion. Mitral Valve: The mitral valve is myxomatous. Moderate systolic anterior motion of the anterior leaflet of the mitral valve. Mild to moderate mitral valve regurgitation. Despite the absence of septal hypertrophy, the highly redundant anterior mitral leaflet exhibitis marked systolic anterior motion. There is mild outflow tract obstruction and secondary mild-moderate mitral insufficiency. Tricuspid Valve: The tricuspid valve is myxomatous. Tricuspid valve regurgitation is mild. Aortic Valve: The aortic valve is normal in structure. Aortic valve regurgitation is not visualized. Aortic valve peak gradient measures 13.7 mmHg. Pulmonic Valve: The pulmonic valve was not well visualized. Pulmonic valve regurgitation is not visualized. Pulmonic regurgitation is not visualized. Aorta: The aortic root is normal in size and structure. IAS/Shunts: No atrial level shunt detected by color flow Doppler.  LEFT VENTRICLE PLAX 2D LVIDd:         3.60 cm  Diastology LVIDs:         2.20 cm  LV e' lateral:   8.92 cm/s LV PW:         1.00 cm  LV E/e' lateral: 15.2 LV IVS:        0.90 cm  LV e' medial:    8.16 cm/s LVOT diam:     1.80 cm  LV  E/e' medial:  16.7 LV SV:         38 ml LV SV Index:   17.62 LVOT Area:     2.54 cm  RIGHT VENTRICLE RV Basal diam:  1.90 cm RV S prime:     11.60 cm/s TAPSE (M-mode): 1.7 cm LEFT ATRIUM             Index       RIGHT ATRIUM          Index LA diam:        3.50 cm 1.63 cm/m  RA Area:     9.35 cm LA Vol (A2C):   47.7 ml 22.19 ml/m RA Volume:   18.20 ml 8.47 ml/m LA Vol (A4C):   77.3 ml 35.96 ml/m LA Biplane Vol: 62.0 ml 28.84 ml/m  AORTIC VALVE AV Area (Vmax): 2.35 cm AV Vmax:        185.00 cm/s AV Peak Grad:   13.7 mmHg LVOT Vmax:      171.00 cm/s LVOT Vmean:     131.000 cm/s LVOT VTI:       0.289 m  AORTA Ao Root diam: 2.60 cm MITRAL VALVE                        TRICUSPID VALVE MV Area (PHT): 3.17 cm             TR Peak grad:   25.8 mmHg MV PHT:        69.31 msec           TR Vmax:        254.00 cm/s MV Decel Time: 239 msec MV E velocity: 136.00 cm/s 103 cm/s SHUNTS                                     Systemic VTI:  0.29 m                                     Systemic Diam: 1.80 cm  Dani Gobble Croitoru MD Electronically signed by Sanda Klein MD  Signature Date/Time: 05/25/2019/2:37:57 PM    Final      Medications:   . sodium chloride    . cefTRIAXone (ROCEPHIN)  IV Stopped (05/25/19 1021)  . norepinephrine (LEVOPHED) Adult infusion 10 mcg/min (05/26/19 0500)  . octreotide  (SANDOSTATIN)    IV infusion 50 mcg/hr (05/26/19 0500)  . pantoprozole (PROTONIX) infusion 8 mg/hr (05/26/19 0555)  . sodium bicarbonate 150 mEq in dextrose 5% 1000 mL 150 mEq (05/26/19 0555)  . sodium chloride     . sodium chloride   Intravenous Once  . Chlorhexidine Gluconate Cloth  6 each Topical Daily  . [START ON 05/27/2019] pantoprazole  40 mg Intravenous Q12H   Place/Maintain arterial line **AND** sodium chloride, acetaminophen **OR** acetaminophen, levalbuterol, ondansetron (ZOFRAN) IV, sodium chloride  Assessment/ Plan:   Acute kidney injury with hypotension and ascites with adequate volume resuscitation and  oliguric acute renal failure.  Urine sodium less than 10 urinalysis bland.  This is consistent with hepatorenal syndrome.  It could also be consistent with early acute tubular necrosis.  Your renal ultrasound did not reveal any evidence of hydronephrosis although did have an absent kidney on the right.  Did discuss with Dr. Halford Chessman 05/26/2019 would be interested in initiating CRRT.  This seems a reasonable approach given the clinical circumstances.  Metabolic acidosis has improved we will discontinue IV sodium bicarbonate  Hyperkalemia resolved with correction of metabolic acidosis  Hypertension/volume appears adequately volume resuscitated at this point will DC IV fluids.  Hypotension responding to Levophed IV  Cirrhosis picture appears like hepatorenal syndrome is developing with ascites pleural effusions third spacing urine sodium less than 10.  Octreotide initiated with marginal benefit.  Anemia continue to follow no obvious source of GI blood loss high risk for varices prophylaxis against peptic ulcer disease  Continues on Rocephin for SBP prophylaxis  Acute diarrhea with no infectious etiology discovered appears to be slowing.   LOS: [place] @TODAY @7 :51 AM

## 2019-05-26 NOTE — Progress Notes (Signed)
NAME:  Richard Saunders, MRN:  [id number], DOB:  11-Sep-1940, LOS: 12 ADMISSION DATE:  05/21/2019, CONSULTATION DATE:  05/15/19 REFERRING MD: Shela Leff, MD  CHIEF COMPLAINT:  Hypotension  Brief History   79 year old male with cirrhosis with SBP- presented with N/V/D and admitted for sepsis secondary to GI etiology. PCCM consulted for hypotension. Following for chronic left pleural effusion.  History of present illness   [name] is a 79 year old male with the below medical history who presented to the ED for abdominal pain, nausea, vomiting and weakness on 1/13-1/14. He left AMA however his lab results after he left demonstrated elevated lipase 2501. He returned to the ED with similar symptoms as well as new onset diarrhea. Prior to admission he has had poor PO intake, decreased UOP and tachycardia in the last week. His toprolol was increased by his Cardiologist for tachycardia. Of note, he did receive COVID vaccination on 1/13 but states symptoms started prior to this.   In the ED he was hypotensive with BP 85/67. Labs significant for WBC 24.8, K 5.5, BUN/Cr 58/2.6 and LA 4.3. CT A/P with cirrhosis and ascites, pancreatitis and bilateral pleural effusion. ED [place] with no pocket seen for ascites drainage. Given IVF and broad spectrum antibiotics with improvement in hypotension.  1/16, patient with persistent hypotension. Has received total 5.5L overnight and on mIVF 125/hr and was transferred to the ICU, transferred back to the floor on 1/22.  Per nursing report, pt began having dark stool in the afternoon of 1/24 throughout the evening, has remained hypotensive and receive 250cc bolus during the day and 1L overnight..  Hgb dropped 2g from 13 to 11.  Around 7p, stool changed to dark maroon and nursing/patient report at least 5 BM's of dark, red blood. 2 units PRBC's ordered.  INR 2.9 and platelets 161. Heparin gtt stopped at 2026.   Pt sees [facility] and last endoscopy in 2020 with  possible varices.   Past Medical History  Atrial fibrillation on Eliquis, hx of PVS, aortic insufficiency, HTN, non-alcoholic cirrhosis, hx of ascites, anxiety/depression.  Significant Hospital Events   1/13 Presented to ED with N/V/abdominal pain. Left AMA 1/15 Returned to ED 1/24 Admitted to the ICU  Consults:  [facility] PCCM  Procedures:  1/17 para 1/19 para 1/19 thora  Significant Diagnostic Tests:  CT A/P Advanced cirrhosis of the liver, ascites, pancreatitis, bilateral pleural effusions (L>R) Ascitic fluid 1/17: 1,365 WBCs (80% PMNs) Ascitic fluid 1/19: WBC 1015 (79% PMNs), LD 112, albumin <1, protein <3, gram stain WBCs L Pleural fluid 1/19: WBC 835 (47% PMNs), LD 122, albumin <1, protein <3, glucose 86, gram stain WBCs  Upper endoscopy: Impression - Normal larynx. - Grade I esophageal varices. - Non-bleeding gastric ulcer with no stigmata of bleeding. - Acute gastritis. - Non-bleeding duodenal ulcers with pigmented material. - Normal second portion of the duodenum and third portion of the duodenum. - The examination was otherwise normal. - No specimens collected.  CT Chest wo contrast: IMPRESSION: 1. 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 No evidence of partial anomalous pulmonary venous return on this noncontrast examination. No pneumothorax. 2. Complete atelectasis/collapse of the left lower lobe and lingula with partial atelectasis/collapse of the right lower lobe with associated air bronchograms. Concomitant infection not excluded on the basis of this examination. 3. Hepatic cirrhosis with moderate volume intra-abdominal ascites. No evidence of splenomegaly. 4. Age-indeterminate mild (  approximately 25%) compression deformity involving the superior endplate of the T3 vertebral body. Correlation for point tenderness at this location is advised.  Micro Data:  1/15 Sars-Cov-2>>  neg 1/15 BCx2>> no growth 1/16 C.diff>> negative 1/17 Ascitic fluid- few WBC >> no growth 1/16 Urine culture >> NG  Antimicrobials:  Vanc 1/15 > 1/15 Cefepime 1/15> 1/17 Flagyl 1/15 >> Ceftriaxone 1/18>>  Interim history/subjective:  The patient denied acute concerns today aside from general fatigue. He stated that he is glad to be taken care of in the hospital and understands that his condition is severe.  Objective   Blood pressure 107/80, pulse (!) 105, temperature 97.7 F (36.5 C), resp. rate 19, height 6' 1"  (1.854 m), weight 103.2 kg, SpO2 91 %. CVP:  [7 mmHg-33 mmHg] 10 mmHg      Intake/Output Summary (Last 24 hours) at 05/26/2019 0622 Last data filed at 05/26/2019 0500 Gross per 24 hour  Intake 4101.23 ml  Output 240 ml  Net 3861.23 ml   Filed Weights   05/09/2019 2143 05/19/19 0400 05/26/19 0400  Weight: 87.4 kg 90.5 kg 103.2 kg   Physical Exam: General: A/O x4, in no acute distress, afebrile, nondiaphoretic HEENT: PEERL, EMO intact Cardio: RRR, no mrg's  Pulmonary: Progressively decreasing breath sounds on the left lower lung fields, normal in upper airways  Abdomen: Bowel sounds normal, progressively more distended today, fluid wave present, nontender MSK: BLE nontender, nonedematous Neuro: Alert, CNII-XII grossly intact, conversational,  Psych: Appropriate affect, not depressed in appearance, engages well  I/O: Total In 13.6L, out 3.3L, past 24 hrs in 4L, out [facility] Problem list   Hypoxic respiratory failure, sepsis   Assessment & Plan:  Acute GIB, likely upper in the setting of coagulopathy and heparin gtt Hypotension Etiology of the hypotension uncertain but felt to be due to blood loss with underlying cirrhosis. Persistent pressor requirements despite aggressive fluid rehydration. CVP 8-13 overnight. Hgb slightly down but stable. No recurrent hematochezia.  Upper endoscopy with nonbleeding ulceration and G1 esophageal varices found. GI  recommending a clear liquid diet for 2 days, continuing present medications, likely discontinuing anticoagulation or antiplatelet agent with his INR being greater than 2 at baseline. He is to teturn to the GI clinic PRN. No aspirin or nonsteroidals until ulcers are healed.  Echo minimally remarkable, not diagnostic.  Hemodialysis catheter in the left SVC per CT Chest.  Plan  -Trend CBC for ongoing blood loss daily -Levophed increased dosing 0-89mg/min overnight but rate remains low -Continue clear liquid diet x 2 days with observation per GI -Continue protonix Q12 hours IV -INR 2.4  AKI Initially felt to be prerenal but there is some concern that in the setting of SBP and GI bleed this could be hepatorenal. Creatinine worse today but potassium now stable. BUN climbing. UOP still minimal the per last 24 hours. Nephrology following, we will discuss the placement of the temp HD catheter in the duplicate SVC with nephrology today. Plan -Continue to monitor BMPs and uop -Consider lasix challenge -UA ordered -Appreciate nephrologies recommendations   Acute hypoxic respiratory failure, L pleural effusion Interval worsening of the left sided pleural effusion. CT demonstrating complete collapse of left lower lobe and lingula vs atelectasis. Likely 2/2 to ascites from cirrhosis although this is primarily left sided. Prior thoracentesis with transudative fluid this admission. Albumin surprisingly WNL's at 3.6. S/p thoracentesis 1/19. May need Lasix as he is up ~10L since admission once his BP stabilizes. Plan -Will need to consider repeat  thoracentesis vs chest tube placement -Continue supplemental O2, repeat CXR in am after volume resuscitation,  -Continue duonebs  Hyperkalemia: Resolved. EKG unremarkable.  Plan -Continue Kayexalate PRN -Trend CMP daily  SBP CT chest on 1/26 demonstrating increased ascitic fluid. Ascitic fluid cultures without growth, will need lifelong prophylaxis. WBC  increasing but afebrile. BP remains soft which in the setting of cirrhosis with ascites could be portal hypertension. Plan -Continue ceftriaxone and Flagyl -Switch to 160/849m Bactrim at discharge  Afib with RVR -d/c heparin and hold metoprolol in the setting of the above, HR 80's overnight Plan -resume rate control with beta blocker when able  Best practice:  Diet: Clears Pain/Anxiety/Delirium protocol (if indicated): -- VAP protocol (if indicated): -- DVT prophylaxis: SCD GI prophylaxis: protonix Glucose control: Per primary Mobility: As tolerated Code Status: Full Disposition:  ICU  Labs   CBC: Recent Labs  Lab 05/23/19 2208 05/23/19 2208 05/21/2019 0643 05/13/2019 0643 05/13/2019 1019 05/26/2019 1800 05/25/19 0343 05/25/19 1207 05/26/19 0425  WBC 26.3*  --  27.1*  --   --  34.0* 28.4*  --  26.4*  NEUTROABS 20.0*  --   --   --   --   --  23.0*  --   --   HGB 11.6*   < > 12.0*   < > 12.1* 13.0 12.3* 11.9* 11.9*  HCT 36.0*   < > 36.3*   < > 36.0* 40.1 37.3* 35.0* 36.2*  MCV 113.9*  --  104.6*  --   --  105.2* 105.1*  --  104.0*  PLT 161  --  139*  --   --  172 141*  --  122*   < > = values in this interval not displayed.   Basic Metabolic Panel: Recent Labs  Lab 05/23/19 0230 05/23/19 0230 05/11/2019 0517 05/03/2019 0815 05/25/19 0343 05/25/19 1207 05/26/19 0425  NA 139   < > 139 138 139 140 139  K 4.8   < > 6.2* 5.7* 5.8* 5.5* 4.8  CL 106  --  108 108 110  --  107  CO2 24  --  17* 21* 19*  --  23  GLUCOSE 116*  --  89 109* 132*  --  173*  BUN 66*  --  85* 86* 94*  --  94*  CREATININE 1.46*  --  2.28* 2.38* 2.50*  --  2.68*  CALCIUM 10.3  --  9.9 9.7 9.4  --  9.2   < > = values in this interval not displayed.   GFR: Estimated Creatinine Clearance: 28.7 mL/min (A) (by C-G formula based on SCr of 2.68 mg/dL (H)). Recent Labs  Lab 05/23/19 2208 05/18/2019 0517 05/26/2019 0643 05/07/2019 0815 05/10/2019 1800 05/25/19 0343 05/26/19 0425  WBC   < >  --  27.1*  --   34.0* 28.4* 26.4*  LATICACIDVEN  --  3.6*  --  2.0*  --   --   --    < > = values in this interval not displayed.   Liver Function Tests: Recent Labs  Lab 05/22/19 0243 05/23/19 0230 05/20/2019 0517 05/25/19 0343 05/26/19 0425  AST 26 24 94* 127* 171*  ALT 15 15 60* 54* 65*  ALKPHOS 32* 34* 31* 34* 37*  BILITOT 1.0 1.3* 1.0 2.0* 1.5*  PROT 5.6* 5.5* 5.2* 5.4* 5.0*  ALBUMIN 4.2 3.8 3.6 3.7 3.4*   No results for input(s): LIPASE, AMYLASE in the last 168 hours. No results for input(s): AMMONIA in the last 168  hours. ABG    Component Value Date/Time   HCO3 22.5 05/25/2019 1207   TCO2 24 05/25/2019 1207   ACIDBASEDEF 6.0 (H) 05/25/2019 1207   O2SAT 65.0 05/25/2019 1207    Coagulation Profile: Recent Labs  Lab 05/23/19 2208 05/26/19 0425  INR 2.6* 2.4*   Cardiac Enzymes: No results for input(s): CKTOTAL, CKMB, CKMBINDEX, TROPONINI in the last 168 hours.  HbA1C: Hgb A1c MFr Bld  Date/Time Value Ref Range Status  07/29/2016 09:14 AM 5.2 4.6 - 6.5 % Final    Comment:    Glycemic Control Guidelines for People with Diabetes:Non Diabetic:  <6%Goal of Therapy: <7%Additional Action Suggested:  >8%   07/22/2012 09:41 AM 5.5 4.6 - 6.5 % Final    Comment:    Glycemic Control Guidelines for People with Diabetes:Non Diabetic:  <6%Goal of Therapy: <7%Additional Action Suggested:  >8%    CRITICAL CARE  Kathi Ludwig, MD [place] Resident, PGY-3  Please see Attending A/P and/or Addendum for final recommendations.

## 2019-05-26 NOTE — Progress Notes (Signed)
[facility] Progress Note Patient Name: Richard Saunders DOB: August 19, 1940 MRN: [id number]   Date of Service  05/26/2019  HPI/Events of Note  Newly inserted dialysis catheter is non-functional.  eICU Interventions  I sent PCCM ground  Provider to evaluate the line.        Kerry Kass Gedalia Mcmillon 05/26/2019, 11:09 PM

## 2019-05-26 NOTE — Anesthesia Procedure Notes (Signed)
Arterial Line Insertion Start/End1/27/2021 9:45 AM, 05/26/2019 10:00 AM Performed by: Gaylene Brooks, CRNA, CRNA  Patient location: ICU. Preanesthetic checklist: patient identified, IV checked, site marked, risks and benefits discussed, surgical consent, monitors and equipment checked, pre-op evaluation and timeout performed Lidocaine 1% used for infiltration Left, radial was placed Catheter size: 20 G Hand hygiene performed  and maximum sterile barriers used  Allen's test indicative of satisfactory collateral circulation Attempts: 2 Procedure performed without using ultrasound guided technique. Following insertion, dressing applied and Biopatch. Post procedure assessment: normal  Patient tolerated the procedure well with no immediate complications.

## 2019-05-27 ENCOUNTER — Inpatient Hospital Stay (HOSPITAL_COMMUNITY): Payer: Medicare HMO

## 2019-05-27 DIAGNOSIS — J9601 Acute respiratory failure with hypoxia: Secondary | ICD-10-CM

## 2019-05-27 LAB — PH, BODY FLUID: pH, Body Fluid: 8.1

## 2019-05-27 LAB — POCT I-STAT 7, (LYTES, BLD GAS, ICA,H+H)
Acid-base deficit: 8 mmol/L — ABNORMAL HIGH (ref 0.0–2.0)
Acid-base deficit: 9 mmol/L — ABNORMAL HIGH (ref 0.0–2.0)
Bicarbonate: 20.7 mmol/L (ref 20.0–28.0)
Bicarbonate: 18.7 mmol/L — ABNORMAL LOW (ref 20.0–28.0)
Calcium, Ion: 1.14 mmol/L — ABNORMAL LOW (ref 1.15–1.40)
Calcium, Ion: 1.09 mmol/L — ABNORMAL LOW (ref 1.15–1.40)
HCT: 34 % — ABNORMAL LOW (ref 39.0–52.0)
HCT: 33 % — ABNORMAL LOW (ref 39.0–52.0)
Hemoglobin: 11.6 g/dL — ABNORMAL LOW (ref 13.0–17.0)
Hemoglobin: 11.2 g/dL — ABNORMAL LOW (ref 13.0–17.0)
O2 Saturation: 91 %
O2 Saturation: 100 %
Patient temperature: 95.7
Potassium: 4.6 mmol/L (ref 3.5–5.1)
Potassium: 4.5 mmol/L (ref 3.5–5.1)
Sodium: 139 mmol/L (ref 135–145)
Sodium: 139 mmol/L (ref 135–145)
TCO2: 22 mmol/L (ref 22–32)
TCO2: 20 mmol/L — ABNORMAL LOW (ref 22–32)
pCO2 arterial: 53.8 mmHg — ABNORMAL HIGH (ref 32.0–48.0)
pCO2 arterial: 41.7 mmHg (ref 32.0–48.0)
pH, Arterial: 7.194 — CL (ref 7.350–7.450)
pH, Arterial: 7.251 — ABNORMAL LOW (ref 7.350–7.450)
pO2, Arterial: 75 mmHg — ABNORMAL LOW (ref 83.0–108.0)
pO2, Arterial: 224 mmHg — ABNORMAL HIGH (ref 83.0–108.0)

## 2019-05-27 LAB — COMPREHENSIVE METABOLIC PANEL
ALT: 110 U/L — ABNORMAL HIGH (ref 0–44)
AST: 296 U/L — ABNORMAL HIGH (ref 15–41)
Albumin: 3.6 g/dL (ref 3.5–5.0)
Alkaline Phosphatase: 41 U/L (ref 38–126)
Anion gap: 12 (ref 5–15)
BUN: 55 mg/dL — ABNORMAL HIGH (ref 8–23)
CO2: 21 mmol/L — ABNORMAL LOW (ref 22–32)
Calcium: 8.1 mg/dL — ABNORMAL LOW (ref 8.9–10.3)
Chloride: 105 mmol/L (ref 98–111)
Creatinine, Ser: 2.02 mg/dL — ABNORMAL HIGH (ref 0.61–1.24)
GFR calc Af Amer: 36 mL/min — ABNORMAL LOW (ref >60)
GFR calc non Af Amer: 31 mL/min — ABNORMAL LOW (ref >60)
Glucose, Bld: 130 mg/dL — ABNORMAL HIGH (ref 70–99)
Potassium: 4.7 mmol/L (ref 3.5–5.1)
Sodium: 138 mmol/L (ref 135–145)
Total Bilirubin: 2 mg/dL — ABNORMAL HIGH (ref 0.3–1.2)
Total Protein: 5.2 g/dL — ABNORMAL LOW (ref 6.5–8.1)

## 2019-05-27 LAB — RENAL FUNCTION PANEL
Albumin: 3.7 g/dL (ref 3.5–5.0)
Anion gap: 14 (ref 5–15)
BUN: 33 mg/dL — ABNORMAL HIGH (ref 8–23)
CO2: 18 mmol/L — ABNORMAL LOW (ref 22–32)
Calcium: 8.4 mg/dL — ABNORMAL LOW (ref 8.9–10.3)
Chloride: 105 mmol/L (ref 98–111)
Creatinine, Ser: 1.7 mg/dL — ABNORMAL HIGH (ref 0.61–1.24)
GFR calc Af Amer: 44 mL/min — ABNORMAL LOW (ref >60)
GFR calc non Af Amer: 38 mL/min — ABNORMAL LOW (ref >60)
Glucose, Bld: 105 mg/dL — ABNORMAL HIGH (ref 70–99)
Phosphorus: 2.7 mg/dL (ref 2.5–4.6)
Potassium: 4.6 mmol/L (ref 3.5–5.1)
Sodium: 137 mmol/L (ref 135–145)

## 2019-05-27 LAB — PROTIME-INR
INR: 3 — ABNORMAL HIGH (ref 0.8–1.2)
Prothrombin Time: 31.1 seconds — ABNORMAL HIGH (ref 11.4–15.2)

## 2019-05-27 LAB — PREPARE FRESH FROZEN PLASMA: Unit division: 0

## 2019-05-27 LAB — CBC
HCT: 38 % — ABNORMAL LOW (ref 39.0–52.0)
Hemoglobin: 12.2 g/dL — ABNORMAL LOW (ref 13.0–17.0)
MCH: 34.6 pg — ABNORMAL HIGH (ref 26.0–34.0)
MCHC: 32.1 g/dL (ref 30.0–36.0)
MCV: 107.6 fL — ABNORMAL HIGH (ref 80.0–100.0)
Platelets: 65 10*3/uL — ABNORMAL LOW (ref 150–400)
RBC: 3.53 MIL/uL — ABNORMAL LOW (ref 4.22–5.81)
RDW: 20.2 % — ABNORMAL HIGH (ref 11.5–15.5)
WBC: 23.2 10*3/uL — ABNORMAL HIGH (ref 4.0–10.5)
nRBC: 0.4 % — ABNORMAL HIGH (ref 0.0–0.2)

## 2019-05-27 LAB — PHOSPHORUS: Phosphorus: 4 mg/dL (ref 2.5–4.6)

## 2019-05-27 LAB — BPAM FFP
Blood Product Expiration Date: 202101272359
Blood Product Expiration Date: 202101272359
ISSUE DATE / TIME: 202101250346
ISSUE DATE / TIME: 202101270707
Unit Type and Rh: 6200
Unit Type and Rh: 6200

## 2019-05-27 LAB — PATHOLOGIST SMEAR REVIEW

## 2019-05-27 LAB — MAGNESIUM: Magnesium: 2.2 mg/dL (ref 1.7–2.4)

## 2019-05-27 LAB — GLUCOSE, CAPILLARY
Glucose-Capillary: 82 mg/dL (ref 70–99)
Glucose-Capillary: 85 mg/dL (ref 70–99)

## 2019-05-27 MED ORDER — VANCOMYCIN HCL IN DEXTROSE 1-5 GM/200ML-% IV SOLN
1000.0000 mg | INTRAVENOUS | Status: DC
  Administered 2019-05-28: 1000 mg via INTRAVENOUS
  Filled 2019-05-27: qty 200

## 2019-05-27 MED ORDER — NOREPINEPHRINE 16 MG/250ML-% IV SOLN
0.0000 ug/min | INTRAVENOUS | Status: DC
  Administered 2019-05-27: 17 ug/min via INTRAVENOUS
  Administered 2019-05-27: 05:00:00 5 ug/min via INTRAVENOUS
  Administered 2019-05-28: 32 ug/min via INTRAVENOUS
  Filled 2019-05-27 (×3): qty 250

## 2019-05-27 MED ORDER — VANCOMYCIN HCL IN DEXTROSE 1-5 GM/200ML-% IV SOLN: 1000.0000 mg | INTRAVENOUS | Status: DC

## 2019-05-27 MED ORDER — FENTANYL BOLUS VIA INFUSION
25.0000 ug | INTRAVENOUS | Status: DC | PRN
  Administered 2019-05-27: 25 ug via INTRAVENOUS
  Filled 2019-05-27: qty 25

## 2019-05-27 MED ORDER — MIDAZOLAM HCL 2 MG/2ML IJ SOLN
1.0000 mg | INTRAMUSCULAR | Status: DC | PRN
  Administered 2019-05-27: 1 mg via INTRAVENOUS
  Filled 2019-05-27: qty 2

## 2019-05-27 MED ORDER — SODIUM CHLORIDE 0.9 % IV SOLN
2.0000 g | Freq: Two times a day (BID) | INTRAVENOUS | Status: DC
  Administered 2019-05-27 – 2019-05-28 (×3): 2 g via INTRAVENOUS
  Filled 2019-05-27 (×3): qty 2

## 2019-05-27 MED ORDER — FENTANYL CITRATE (PF) 100 MCG/2ML IJ SOLN: 25.0000 ug | INTRAMUSCULAR | Status: DC | PRN

## 2019-05-27 MED ORDER — CHLORHEXIDINE GLUCONATE 0.12% ORAL RINSE (MEDLINE KIT)
15.0000 mL | Freq: Two times a day (BID) | OROMUCOSAL | Status: DC
  Administered 2019-05-27 – 2019-05-28 (×3): 15 mL via OROMUCOSAL

## 2019-05-27 MED ORDER — VANCOMYCIN HCL 2000 MG/400ML IV SOLN
2000.0000 mg | Freq: Once | INTRAVENOUS | Status: AC
  Administered 2019-05-27: 09:00:00 2000 mg via INTRAVENOUS
  Filled 2019-05-27: qty 400

## 2019-05-27 MED ORDER — FENTANYL CITRATE (PF) 100 MCG/2ML IJ SOLN
INTRAMUSCULAR | Status: AC
  Filled 2019-05-27: qty 2

## 2019-05-27 MED ORDER — MIDAZOLAM HCL 2 MG/2ML IJ SOLN
INTRAMUSCULAR | Status: AC
  Filled 2019-05-27: qty 2

## 2019-05-27 MED ORDER — ROCURONIUM BROMIDE 50 MG/5ML IV SOLN
50.0000 mg | Freq: Once | INTRAVENOUS | Status: AC
  Administered 2019-05-27: 50 mg via INTRAVENOUS
  Filled 2019-05-27: qty 5

## 2019-05-27 MED ORDER — FENTANYL 2500MCG IN NS 250ML (10MCG/ML) PREMIX INFUSION
25.0000 ug/h | INTRAVENOUS | Status: DC
  Administered 2019-05-27: 50 ug/h via INTRAVENOUS
  Filled 2019-05-27: qty 250

## 2019-05-27 MED ORDER — FENTANYL BOLUS VIA INFUSION
50.0000 ug | INTRAVENOUS | Status: DC | PRN
  Filled 2019-05-27: qty 100

## 2019-05-27 MED ORDER — VITAL HIGH PROTEIN PO LIQD
1000.0000 mL | ORAL | Status: DC
  Administered 2019-05-27: 17:00:00 1000 mL

## 2019-05-27 MED ORDER — ACETAMINOPHEN 650 MG RE SUPP: 650.0000 mg | Freq: Four times a day (QID) | RECTAL | Status: DC | PRN

## 2019-05-27 MED ORDER — ETOMIDATE 2 MG/ML IV SOLN
20.0000 mg | Freq: Once | INTRAVENOUS | Status: AC
  Administered 2019-05-27: 10:00:00 20 mg via INTRAVENOUS

## 2019-05-27 MED ORDER — METRONIDAZOLE IN NACL 5-0.79 MG/ML-% IV SOLN
500.0000 mg | Freq: Three times a day (TID) | INTRAVENOUS | Status: DC
  Administered 2019-05-27 – 2019-05-28 (×4): 500 mg via INTRAVENOUS
  Filled 2019-05-27 (×4): qty 100

## 2019-05-27 MED ORDER — NOREPINEPHRINE 4 MG/250ML-% IV SOLN: 0.0000 ug/min | INTRAVENOUS | Status: DC

## 2019-05-27 MED ORDER — FENTANYL CITRATE (PF) 100 MCG/2ML IJ SOLN
50.0000 ug | Freq: Once | INTRAMUSCULAR | Status: AC
  Administered 2019-05-27: 50 ug via INTRAVENOUS

## 2019-05-27 MED ORDER — FENTANYL CITRATE (PF) 100 MCG/2ML IJ SOLN
25.0000 ug | INTRAMUSCULAR | Status: DC | PRN
  Administered 2019-05-27: 100 ug via INTRAVENOUS
  Filled 2019-05-27: qty 2

## 2019-05-27 MED ORDER — MIDAZOLAM HCL 2 MG/2ML IJ SOLN
2.0000 mg | Freq: Once | INTRAMUSCULAR | Status: AC
  Administered 2019-05-27: 10:00:00 2 mg via INTRAVENOUS

## 2019-05-27 MED ORDER — MIDAZOLAM HCL 2 MG/2ML IJ SOLN
2.0000 mg | Freq: Once | INTRAMUSCULAR | Status: AC
  Administered 2019-05-27: 2 mg via INTRAVENOUS

## 2019-05-27 MED ORDER — MIDAZOLAM HCL 2 MG/2ML IJ SOLN
1.0000 mg | INTRAMUSCULAR | Status: DC | PRN
  Administered 2019-05-27: 17:00:00 1 mg via INTRAVENOUS
  Filled 2019-05-27 (×2): qty 2

## 2019-05-27 MED ORDER — ALBUMIN HUMAN 5 % IV SOLN
12.5000 g | Freq: Once | INTRAVENOUS | Status: AC
  Administered 2019-05-27: 05:00:00 12.5 g via INTRAVENOUS
  Filled 2019-05-27: qty 250

## 2019-05-27 MED ORDER — ORAL CARE MOUTH RINSE
15.0000 mL | OROMUCOSAL | Status: DC
  Administered 2019-05-27 – 2019-05-28 (×12): 15 mL via OROMUCOSAL

## 2019-05-27 MED ORDER — DEXMEDETOMIDINE HCL IN NACL 400 MCG/100ML IV SOLN
0.0000 ug/kg/h | INTRAVENOUS | Status: DC
  Administered 2019-05-27: 0.4 ug/kg/h via INTRAVENOUS
  Filled 2019-05-27: qty 100

## 2019-05-27 MED ORDER — ACETAMINOPHEN 325 MG PO TABS: 650.0000 mg | ORAL_TABLET | Freq: Four times a day (QID) | ORAL | Status: DC | PRN

## 2019-05-27 MED ORDER — ALBUMIN HUMAN 25 % IV SOLN: 25.0000 g | Freq: Once | INTRAVENOUS | Status: DC

## 2019-05-27 MED ORDER — VANCOMYCIN HCL 2000 MG/400ML IV SOLN
2000.0000 mg | Freq: Once | INTRAVENOUS | Status: DC
  Filled 2019-05-27: qty 400

## 2019-05-27 MED ORDER — SODIUM CHLORIDE 0.9 % IV SOLN
INTRAVENOUS | Status: DC | PRN
  Administered 2019-05-27: 250 mL via INTRAVENOUS

## 2019-05-27 MED FILL — Sodium Chloride IV Soln 0.9%: INTRAVENOUS | Qty: 250 | Status: AC

## 2019-05-27 MED FILL — Sodium Chloride IV Soln 0.9%: INTRAVENOUS | Qty: 250 | Status: CN

## 2019-05-27 MED FILL — Phenylephrine HCl IV Soln 10 MG/ML: INTRAVENOUS | Qty: 100 | Status: AC

## 2019-05-27 MED FILL — Phenylephrine HCl IV Soln 10 MG/ML: INTRAVENOUS | Qty: 100 | Status: CN

## 2019-05-27 NOTE — Procedures (Signed)
Central Venous Catheter Insertion Procedure Note Richard Saunders [id number] [birth year]  Procedure: Insertion of Central Venous Catheter Indications: Assessment of intravascular volume, Drug and/or fluid administration and Frequent blood sampling  Procedure Details Consent: Risks of procedure as well as the alternatives and risks of each were explained to the (patient/caregiver).  Consent for procedure obtained. Time Out: Verified patient identification, verified procedure, site/side was marked, verified correct patient position, special equipment/implants available, medications/allergies/relevent history reviewed, required imaging and test results available.  Performed  Maximum sterile technique was used including antiseptics, cap, gloves, gown, hand hygiene, mask and sheet. Skin prep: Chlorhexidine; local anesthetic administered A antimicrobial bonded/coated triple lumen catheter was placed in the left femoral vein due to multiple attempts, no other available access using the Seldinger technique.  Evaluation Blood flow good Complications: No apparent complications Patient did tolerate procedure well. Chest X-ray ordered to verify placement.  CXR: Not applicable.  Richard Saunders 05/27/2019, 5:43 PM

## 2019-05-27 NOTE — Progress Notes (Signed)
Richard Saunders   Subjective:   Is a 79 year old gentleman history aortic insufficiency, atrial fibrillation, hypertension nonalcoholic cirrhosis and ascites.  He had left the hospital [place] after a stay 1 night 05/12/2019 to 05/13/2019 but return to the emergency room 05/19/2019 with hypotension and a CT scan that was consistent with acute pancreatitis.  He has had progressively worse renal function despite aggressive volume resuscitation.  His ultrasound of his kidneys showed small atrophic right kidney with increased echogenicity of the right renal parenchyma and nonvisualization of the left kidney and hepatic cirrhosis with intra-abdominal ascites.  05/25/2019.  He also underwent CT scan of his chest that showed atelectasis and collapse of the left lower lobe and lingula hepatic cirrhosis with moderate volume intra-abdominal ascites and age indeterminant compression deformity of the superior endplate of T3.  Patient was initiated on CRRT 05/26/2019.  Patient in positive fluid balance with no net ultrafiltration.  Positive balance 2 L 05/26/2019   Blood pressure 90/57 pulse 117 temperature 95 O2 sats 95% 2 L nasal cannula  Sodium 138 potassium 4.7 chloride 105 CO2 21 BUN 55 creatinine 2.0 glucose 130 calcium 8.1 phosphorus 4.0 magnesium 2.2 albumin 3.6 AST 296 ALT 110 WBC 23.2 hemoglobin 12.2 platelets 65  IV vasopressin 0.04 IV norepinephrine 11 mcg/min IV Protonix IV phenylephrine 400 mics per minute  Urine output 125 cc 05/26/2019  Urinalysis was bland with no white cells no red blood cells 05/25/2019 urine sodium less than 10    Objective:  Vital signs in last 24 hours:  Temp:  [94.8 F (34.9 C)-98.2 F (36.8 C)] 95 F (35 C) (01/28 0700) Pulse Rate:  [27-130] 110 (01/28 0700) Resp:  [11-26] 16 (01/28 0700) BP: (65-104)/(28-80) 98/74 (01/28 0700) SpO2:  [86 %-100 %] 95 % (01/28 0700) Weight:  [106.6 kg] 106.6 kg (01/28 0349)  Weight  change: 3.4 kg Filed Weights   05/19/19 0400 05/26/19 0400 05/27/19 0349  Weight: 90.5 kg 103.2 kg 106.6 kg    Intake/Output: I/O last 3 completed shifts: In: 6049.7 [P.O.:150; I.V.:5257.5; Other:110; IV Piggyback:532.2] Out: 2051 [Urine:300; Other:1751]   Intake/Output this shift:  No intake/output data recorded.  Nondistressed resting comfortably periorbital edema CVS-tachycardic irregular rate and rhythm systolic murmur left sternal edge RS-anterior lung fields were clear ABD-hypoactive bowel sounds with ascites clinically EXT-1+ pitting edema   Basic Metabolic Panel: Recent Labs  Lab 05/29/2019 0815 05/12/2019 0815 05/25/19 0343 05/25/19 0343 05/25/19 1207 05/26/19 0425 05/26/19 1600 05/27/19 0338  NA 138   < > 139  --  140 139 138 138  K 5.7*   < > 5.8*  --  5.5* 4.8 4.8 4.7  CL 108  --  110  --   --  107 106 105  CO2 21*  --  19*  --   --  23 23 21*  GLUCOSE 109*  --  132*  --   --  173* 139* 130*  BUN 86*  --  94*  --   --  94* 74* 55*  CREATININE 2.38*  --  2.50*  --   --  2.68* 2.17* 2.02*  CALCIUM 9.7   < > 9.4   < >  --  9.2 8.6* 8.1*  MG  --   --   --   --   --   --   --  2.2  PHOS  --   --   --   --   --   --  3.5  4.0   < > = values in this interval not displayed.    Liver Function Tests: Recent Labs  Lab 05/23/19 0230 05/23/19 0230 05/09/2019 0517 05/25/19 0343 05/26/19 0425 05/26/19 1600 05/27/19 0338  AST 24  --  94* 127* 171*  --  296*  ALT 15  --  60* 54* 65*  --  110*  ALKPHOS 34*  --  31* 34* 37*  --  41  BILITOT 1.3*  --  1.0 2.0* 1.5*  --  2.0*  PROT 5.5*  --  5.2* 5.4* 5.0*  --  5.2*  ALBUMIN 3.8   < > 3.6 3.7 3.4* 3.5 3.6   < > = values in this interval not displayed.   No results for input(s): LIPASE, AMYLASE in the last 168 hours. No results for input(s): AMMONIA in the last 168 hours.  CBC: Recent Labs  Lab 05/23/19 2208 05/23/19 2208 05/10/2019 0643 05/20/2019 1019 05/21/2019 1800 05/25/19 0343 05/25/19 1207 05/26/19 0425  05/27/19 0338  WBC 26.3*   < > 27.1*  --  34.0* 28.4*  --  26.4* 23.2*  NEUTROABS 20.0*  --   --   --   --  23.0*  --   --   --   HGB 11.6*   < > 12.0*   < > 13.0 12.3* 11.9* 11.9* 12.2*  HCT 36.0*   < > 36.3*   < > 40.1 37.3* 35.0* 36.2* 38.0*  MCV 113.9*   < > 104.6*  --  105.2* 105.1*  --  104.0* 107.6*  PLT 161   < > 139*  --  172 141*  --  122* 65*   < > = values in this interval not displayed.    Cardiac Enzymes: No results for input(s): CKTOTAL, CKMB, CKMBINDEX, TROPONINI in the last 168 hours.  BNP: Invalid input(s): POCBNP  CBG: Recent Labs  Lab 05/09/2019 0338 05/10/2019 1118 05/26/19 1540  GLUCAP 76 103* 108*    Microbiology: Results for orders placed or performed during the hospital encounter of 05/13/2019  Blood Culture (routine x 2)     Status: None   Collection Time: 04/30/2019  2:10 PM   Specimen: BLOOD  Result Value Ref Range Status   Specimen Description BLOOD RIGHT ANTECUBITAL  Final   Special Requests   Final    BOTTLES DRAWN AEROBIC ONLY Blood Culture results may not be optimal due to an inadequate volume of blood received in culture bottles   Culture   Final    NO GROWTH 5 DAYS Performed at [facility], [place] [address] [place], [place] [address]    Report Status 05/19/2019 FINAL  Final  Blood Culture (routine x 2)     Status: None   Collection Time: 05/15/2019  2:10 PM   Specimen: BLOOD  Result Value Ref Range Status   Specimen Description BLOOD LEFT ANTECUBITAL  Final   Special Requests   Final    BOTTLES DRAWN AEROBIC AND ANAEROBIC Blood Culture adequate volume   Culture   Final    NO GROWTH 5 DAYS Performed at [facility], [place] [address] [place], [place] [address]    Report Status 05/19/2019 FINAL  Final  Respiratory Panel by RT PCR (Flu A&B, Covid) - Nasopharyngeal Swab     Status: None   Collection Time: 05/16/2019  2:20 PM   Specimen: Nasopharyngeal Swab  Result Value Ref Range Status   SARS Coronavirus 2 by RT PCR NEGATIVE  NEGATIVE Final  Comment: (Saunders) SARS-CoV-2 target nucleic acids are NOT DETECTED. The SARS-CoV-2 RNA is generally detectable in upper respiratoy specimens during the acute phase of infection. The lowest concentration of SARS-CoV-2 viral copies this assay can detect is 131 copies/mL. A negative result does not preclude SARS-Cov-2 infection and should not be used as the sole basis for treatment or other patient management decisions. A negative result may occur with  improper specimen collection/handling, submission of specimen other than nasopharyngeal swab, presence of viral mutation(s) within the areas targeted by this assay, and inadequate number of viral copies (<131 copies/mL). A negative result must be combined with clinical observations, patient history, and epidemiological information. The expected result is Negative. Fact Sheet for Patients:  PinkCheek.be Fact Sheet for Healthcare Providers:  GravelBags.it This test is not yet ap proved or cleared by the [place] FDA and  has been authorized for detection and/or diagnosis of SARS-CoV-2 by FDA under an Emergency Use Authorization (EUA). This EUA will remain  in effect (meaning this test can be used) for the duration of the COVID-19 declaration under Section 564(b)(1) of the Act, 21 U.S.C. section 360bbb-3(b)(1), unless the authorization is terminated or revoked sooner.    Influenza A by PCR NEGATIVE NEGATIVE Final   Influenza B by PCR NEGATIVE NEGATIVE Final    Comment: (Saunders) The Xpert Xpress SARS-CoV-2/FLU/RSV assay is intended as an aid in  the diagnosis of influenza from Nasopharyngeal swab specimens and  should not be used as a sole basis for treatment. Nasal washings and  aspirates are unacceptable for Xpert Xpress SARS-CoV-2/FLU/RSV  testing. Fact Sheet for Patients: PinkCheek.be Fact Sheet for Healthcare  Providers: GravelBags.it This test is not yet approved or cleared by the [place] FDA and  has been authorized for detection and/or diagnosis of SARS-CoV-2 by  FDA under an Emergency Use Authorization (EUA). This EUA will remain  in effect (meaning this test can be used) for the duration of the  Covid-19 declaration under Section 564(b)(1) of the Act, 21  U.S.C. section 360bbb-3(b)(1), unless the authorization is  terminated or revoked. Performed at [facility], [place] [address], [place], [place] [address]   C difficile quick scan w PCR reflex     Status: None   Collection Time: 05/15/19  1:35 AM   Specimen: STOOL  Result Value Ref Range Status   C Diff antigen NEGATIVE NEGATIVE Final   C Diff toxin NEGATIVE NEGATIVE Final   C Diff interpretation No C. difficile detected.  Final    Comment: Performed at [facility], [place] [address], [place], [place] [address]  GI pathogen panel by PCR, stool     Status: None   Collection Time: 05/15/19  1:35 AM   Specimen: Stool  Result Value Ref Range Status   Plesiomonas shigelloides NOT DETECTED NOT DETECTED Final   Yersinia enterocolitica NOT DETECTED NOT DETECTED Final   Vibrio NOT DETECTED NOT DETECTED Final   Enteropathogenic E coli NOT DETECTED NOT DETECTED Final   E coli (ETEC) LT/ST NOT DETECTED NOT DETECTED Final   E coli 7564 by PCR Not applicable NOT DETECTED Final   Cryptosporidium by PCR NOT DETECTED NOT DETECTED Final   Entamoeba histolytica NOT DETECTED NOT DETECTED Final   Adenovirus F 40/41 NOT DETECTED NOT DETECTED Final   Norovirus GI/GII NOT DETECTED NOT DETECTED Final   Sapovirus NOT DETECTED NOT DETECTED Final    Comment: (Saunders) Performed At: [facility] [address] [place], [place] [id number] Rush Farmer  MD KG:[id number]    Vibrio cholerae NOT DETECTED NOT DETECTED Final   Campylobacter by PCR NOT DETECTED NOT DETECTED Final   Salmonella by PCR NOT DETECTED  NOT DETECTED Final   E coli (STEC) NOT DETECTED NOT DETECTED Final   Enteroaggregative E coli NOT DETECTED NOT DETECTED Final   Shigella by PCR NOT DETECTED NOT DETECTED Final   Cyclospora cayetanensis NOT DETECTED NOT DETECTED Final   Astrovirus NOT DETECTED NOT DETECTED Final   G lamblia by PCR NOT DETECTED NOT DETECTED Final   Rotavirus A by PCR NOT DETECTED NOT DETECTED Final  Urine culture     Status: None   Collection Time: 05/15/19  9:10 AM   Specimen: In/Out Cath Urine  Result Value Ref Range Status   Specimen Description IN/OUT CATH URINE  Final   Special Requests NONE  Final   Culture   Final    NO GROWTH Performed at [facility], [place] [address], [place], [place] [address]    Report Status 05/16/2019 FINAL  Final  Body fluid culture     Status: None   Collection Time: 05/16/19  3:12 PM   Specimen: Peritoneal Washings; Body Fluid  Result Value Ref Range Status   Specimen Description PERITONEAL  Final   Special Requests Normal  Final   Gram Stain   Final    FEW WBC PRESENT,BOTH PMN AND MONONUCLEAR NO ORGANISMS SEEN    Culture   Final    NO GROWTH 3 DAYS Performed at [facility], [address], [place], [place] [address]    Report Status 05/19/2019 FINAL  Final  Body fluid culture     Status: None   Collection Time: 05/18/19  5:29 PM   Specimen: Body Fluid  Result Value Ref Range Status   Specimen Description FLUID PARACENTESIS  Final   Special Requests NONE  Final   Gram Stain   Final    FEW WBC PRESENT, PREDOMINANTLY PMN NO ORGANISMS SEEN    Culture   Final    NO GROWTH Performed at [facility], [address], [place], [place] [address]    Report Status 05/22/2019 FINAL  Final  Body fluid culture     Status: None   Collection Time: 05/18/19  5:31 PM   Specimen: Body Fluid  Result Value Ref Range Status   Specimen Description FLUID LEFT PLEURAL  Final   Special Requests NONE  Final   Gram Stain   Final    FEW WBC PRESENT,BOTH PMN  AND MONONUCLEAR NO ORGANISMS SEEN    Culture   Final    NO GROWTH Performed at [facility], [address] [place], [place] [address]    Report Status 05/21/2019 FINAL  Final  MRSA PCR Screening     Status: None   Collection Time: 05/04/2019  3:23 AM   Specimen: Nasal Mucosa; Nasopharyngeal  Result Value Ref Range Status   MRSA by PCR NEGATIVE NEGATIVE Final    Comment:        The GeneXpert MRSA Assay (FDA approved for NASAL specimens only), is one component of a comprehensive MRSA colonization surveillance program. It is not intended to diagnose MRSA infection nor to guide or monitor treatment for MRSA infections. Performed at [facility], [place] [address], [place], [place] [address]   Body fluid culture (includes gram stain)     Status: None (Preliminary result)   Collection Time: 05/26/19 10:25 AM   Specimen:  Pleural Fluid  Result Value Ref Range Status   Specimen Description PLEURAL LEFT  Final   Special Requests NONE  Final   Gram Stain   Final    RARE WBC PRESENT, PREDOMINANTLY MONONUCLEAR NO ORGANISMS SEEN Performed at [facility], [address] [place], [place] [address]    Culture PENDING  Incomplete   Report Status PENDING  Incomplete    Coagulation Studies: Recent Labs    05/26/19 0425 05/27/19 0338  LABPROT 26.4* 31.1*  INR 2.4* 3.0*    Urinalysis: Recent Labs    05/25/19 0904  COLORURINE AMBER*  LABSPEC 1.018  PHURINE 5.0  GLUCOSEU NEGATIVE  HGBUR NEGATIVE  BILIRUBINUR NEGATIVE  KETONESUR NEGATIVE  PROTEINUR 30*  NITRITE NEGATIVE  LEUKOCYTESUR NEGATIVE      Imaging: CT CHEST WO CONTRAST  Result Date: 05/25/2019 CLINICAL DATA:  Central venous catheter placement with chest radiograph concerning for malpositioning. Acquired ABG from the catheter demonstrates a venous positioning with CVP measurement of 8 to 10. EXAM: CT CHEST WITHOUT CONTRAST TECHNIQUE: Multidetector CT imaging of the chest was performed following the  standard protocol without IV contrast. COMPARISON:  Chest radiograph-earlier same day; CT abdomen and pelvis - 05/22/2019 FINDINGS: Cardiovascular: Tip of dialysis catheter terminates within the caudal aspect of a duplicated SVC which appears to drain into the right atrium though this is suboptimally evaluated on this noncontrast examination. No definite evidence of partial anomalous venous return on this noncontrast examination. Normal heart size. Coronary artery calcifications. No pericardial effusion. Normal caliber of the thoracic aorta. Bovine configuration of the aortic arch. Mediastinum/Nodes: No bulky mediastinal, hilar axillary lymphadenopathy on this noncontrast examination. Lungs/Pleura: Small left-sided pleural effusion with associated consolidation/collapse involving the left lower lobe and lingula with associated air bronchograms. Trace right-sided pleural effusion with associated right lower lobe atelectasis/collapse and air bronchograms. The remaining central pulmonary airways appear widely patent. No pneumothorax. Saunders is made of a punctate granuloma within the right upper lobe (image 31, series 5). Upper Abdomen: Limited noncontrast evaluation of the upper abdomen demonstrates marked nodularity of the hepatic contour with moderate volume intra-abdominal ascites. No evidence of splenomegaly. Musculoskeletal: Age-indeterminate though presumably chronic mild (approximately 25%) compression deformity involving the superior endplate of the T3 vertebral body without associated fracture line, retropulsion or definitive paraspinal hematoma. Mild-to-moderate multilevel DDD within the cervical spine. Mild diffuse body wall anasarca.  Symmetric bilateral gynecomastia. IMPRESSION: 1. 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 No evidence of partial anomalous pulmonary venous return on this noncontrast examination. No pneumothorax.  2. Complete atelectasis/collapse of the left lower lobe and lingula with partial atelectasis/collapse of the right lower lobe with associated air bronchograms. Concomitant infection not excluded on the basis of this examination. 3. Hepatic cirrhosis with moderate volume intra-abdominal ascites. No evidence of splenomegaly. 4. Age-indeterminate mild (approximately 25%) compression deformity involving the superior endplate of the T3 vertebral body. Correlation for point tenderness at this location is advised. Electronically Signed   By: Sandi Mariscal M.D.   On: 05/25/2019 16:23   US RENAL  Result Date: 05/25/2019 CLINICAL DATA:  Initial evaluation for acute renal insufficiency. EXAM: RENAL / URINARY TRACT ULTRASOUND COMPLETE COMPARISON:  Prior CT from earlier the same day. FINDINGS: Right Kidney: Renal measurements: 9.4 x 4.5 x 4.0 cm = volume: 88.8 mL. Right kidney is small and atrophic in appearance with increased echogenicity within the renal parenchyma, compatible with chronic medical renal disease. No  visible nephrolithiasis or hydronephrosis. No focal renal mass. Left Kidney: Not visualized. Bladder: Appears normal for degree of bladder distention. Other: Hepatic cirrhosis with intra-abdominal ascites partially visualized. IMPRESSION: 1. Small and atrophic right kidney with increased echogenicity within the right renal parenchyma, compatible with chronic medical renal disease. No hydronephrosis. 2. Nonvisualization of the left kidney. 3. Hepatic cirrhosis with intra-abdominal ascites, partially visualized. Electronically Signed   By: Jeannine Boga M.D.   On: 05/25/2019 23:21   DG CHEST PORT 1 VIEW  Result Date: 05/27/2019 CLINICAL DATA:  Central line placement EXAM: PORTABLE CHEST 1 VIEW COMPARISON:  Radiograph 05/26/2019, CT 05/25/2019 FINDINGS: Left IJ central venous catheter tip terminates within a persistent left SVC which appears to drain into the coronary sinus/right atrium on comparison CT.  Appears to have been slightly retracted since comparison study additional telemetry leads overlie the chest. There is complete opacification left hemithorax likely reflecting increasing pleural effusion. Underlying parenchymal changes in the left lung are impossible to further evaluate. There are some streaky opacities in the right lung base favoring atelectasis. No right pleural effusion. No visible pneumothorax. Right heart border is unremarkable. The remaining cardiomediastinal contours are obscured by overlying opacity. No acute osseous or soft tissue abnormality. Degenerative changes are present in the imaged spine and shoulders. IMPRESSION: 1. Left IJ central venous catheter tip terminates within a persistent left SVC which appears to drain into the coronary sinus/right atrium on comparison CT. Appears to have been slightly retracted since comparison study. 2. Complete opacification of the left hemithorax likely reflecting increasing pleural effusion. 3. Right basilar atelectasis. Electronically Signed   By: Lovena Le M.D.   On: 05/27/2019 00:36   DG Chest Port 1 View  Result Date: 05/26/2019 CLINICAL DATA:  Thoracentesis on the left. EXAM: PORTABLE CHEST 1 VIEW COMPARISON:  CT chest and chest radiograph 05/25/2019. FINDINGS: Trachea is midline. Heart size is grossly stable. Left pleural effusion is moderate in size and appears similar to yesterday's exam. No definite pneumothorax after left thoracentesis. Left basilar collapse/consolidation. Probable atelectasis at the right lung base. Left IJ central line is seen in a persistent left SVC. IMPRESSION: 1. No definite pneumothorax after left thoracentesis. Moderate left pleural effusion appears similar to yesterday's exam. 2. Left lower lobe collapse/consolidation, possibly due to pneumonia. Centrally obstructing lesion cannot be excluded. 3. Right basilar atelectasis. Electronically Signed   By: Lorin Picket M.D.   On: 05/26/2019 11:36   DG CHEST  PORT 1 VIEW  Result Date: 05/25/2019 CLINICAL DATA:  Central line placement EXAM: PORTABLE CHEST 1 VIEW COMPARISON:  05/21/2019 FINDINGS: Interval placement of a dual lumen central venous catheter from the left neck. Catheter is malposition descending along the left hemithorax. Heart size is stable. Lung volumes are low. Left-sided effusion with hazy opacity. No discernible pneumothorax. IMPRESSION: 1. Malpositioned left-sided central venous catheter. It is indeterminate whether the catheter is within a anomalous vein or within the descending thoracic aorta via the left subclavian artery. 2. Left-sided effusion with hazy opacity. 3. No discernible pneumothorax. These results will be called to the ordering clinician or representative by the Radiologist Assistant, and communication documented in the PACS or zVision Dashboard. Electronically Signed   By: Davina Poke D.O.   On: 05/25/2019 12:09   ECHOCARDIOGRAM COMPLETE  Result Date: 05/25/2019   ECHOCARDIOGRAM REPORT   Patient Name:   ABDULWAHAB DEMELO Date of Exam: 05/25/2019 Medical Rec #:  [id number]         Height:  73.0 in Accession #:    [id number]        Weight:       199.5 lb Date of Birth:  [birth year]        BSA:          2.15 m Patient Age:    38 years          BP:           95/62 mmHg Patient Gender: M                 HR:           80 bpm. Exam Location:  Inpatient Procedure: 2D Echo, Cardiac Doppler and Color Doppler Indications:    Hypotension  History:        Patient has prior history of Echocardiogram examinations, most                 recent 05/29/2018. Arrythmias:Atrial Fibrillation and                 Tachycardia; Signs/Symptoms:Hypotension. Sepsis, GI bleed,                 pleural effusion, Resp. Failure.  Sonographer:    Dustin Flock Referring Phys: [phone] VINEET SOOD  Sonographer Comments: Suboptimal parasternal window and Technically difficult study due to poor echo windows. No echo windows. IMPRESSIONS  1. Left ventricular  ejection fraction, by visual estimation, is 50 to 55%. The left ventricle has low normal function. There is no left ventricular hypertrophy.  2. Abnormal septal motion consistent with left bundle branch block.  3. The left ventricle has no regional wall motion abnormalities.  4. Global right ventricle has normal systolic function.The right ventricular size is normal. No increase in right ventricular wall thickness.  5. Left atrial size was mildly dilated.  6. Right atrial size was normal.  7. Moderate anterior leaflet systolic anterior motion of the mitral valve.  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  9. Despite the absence of septal hypertrophy, the highly redundant anterior mitral leaflet exhibitis marked systolic anterior motion. There is mild outflow tract obstruction and secondary mild-moderate mitral insufficiency. 10. The tricuspid valve is myxomatous. 11. The tricuspid valve is myxomatous. Tricuspid valve regurgitation is mild. 12. The aortic valve is normal in structure. Aortic valve regurgitation is not visualized. 13. The pulmonic valve was not well visualized. Pulmonic valve regurgitation is not visualized. 14. Normal pulmonary artery systolic pressure. FINDINGS  Left Ventricle: Left ventricular ejection fraction, by visual estimation, is 50 to 55%. The left ventricle has low normal function. The left ventricle has no regional wall motion abnormalities. There is no left ventricular hypertrophy. Abnormal (paradoxical) septal motion, consistent with left bundle branch block. Right Ventricle: The right ventricular size is normal. No increase in right ventricular wall thickness. Global RV systolic function is has normal systolic function. The tricuspid regurgitant velocity is 2.54 m/s, and with an assumed right atrial pressure  of 3 mmHg, the estimated right ventricular systolic pressure is normal at 28.8 mmHg. Left Atrium: Left atrial size was mildly dilated. Right  Atrium: Right atrial size was normal in size Pericardium: There is no evidence of pericardial effusion. Mitral Valve: The mitral valve is myxomatous. Moderate systolic anterior motion of the anterior leaflet of the mitral valve. Mild to moderate mitral valve regurgitation. Despite the absence of septal hypertrophy, the highly redundant anterior mitral leaflet exhibitis marked systolic anterior motion. There is mild outflow tract  obstruction and secondary mild-moderate mitral insufficiency. Tricuspid Valve: The tricuspid valve is myxomatous. Tricuspid valve regurgitation is mild. Aortic Valve: The aortic valve is normal in structure. Aortic valve regurgitation is not visualized. Aortic valve peak gradient measures 13.7 mmHg. Pulmonic Valve: The pulmonic valve was not well visualized. Pulmonic valve regurgitation is not visualized. Pulmonic regurgitation is not visualized. Aorta: The aortic root is normal in size and structure. IAS/Shunts: No atrial level shunt detected by color flow Doppler.  LEFT VENTRICLE PLAX 2D LVIDd:         3.60 cm  Diastology LVIDs:         2.20 cm  LV e' lateral:   8.92 cm/s LV PW:         1.00 cm  LV E/e' lateral: 15.2 LV IVS:        0.90 cm  LV e' medial:    8.16 cm/s LVOT diam:     1.80 cm  LV E/e' medial:  16.7 LV SV:         38 ml LV SV Index:   17.62 LVOT Area:     2.54 cm  RIGHT VENTRICLE RV Basal diam:  1.90 cm RV S prime:     11.60 cm/s TAPSE (M-mode): 1.7 cm LEFT ATRIUM             Index       RIGHT ATRIUM          Index LA diam:        3.50 cm 1.63 cm/m  RA Area:     9.35 cm LA Vol (A2C):   47.7 ml 22.19 ml/m RA Volume:   18.20 ml 8.47 ml/m LA Vol (A4C):   77.3 ml 35.96 ml/m LA Biplane Vol: 62.0 ml 28.84 ml/m  AORTIC VALVE AV Area (Vmax): 2.35 cm AV Vmax:        185.00 cm/s AV Peak Grad:   13.7 mmHg LVOT Vmax:      171.00 cm/s LVOT Vmean:     131.000 cm/s LVOT VTI:       0.289 m  AORTA Ao Root diam: 2.60 cm MITRAL VALVE                        TRICUSPID VALVE MV Area (PHT):  3.17 cm             TR Peak grad:   25.8 mmHg MV PHT:        69.31 msec           TR Vmax:        254.00 cm/s MV Decel Time: 239 msec MV E velocity: 136.00 cm/s 103 cm/s SHUNTS                                     Systemic VTI:  0.29 m                                     Systemic Diam: 1.80 cm  Dani Gobble Croitoru MD Electronically signed by Sanda Klein MD Signature Date/Time: 05/25/2019/2:37:57 PM    Final      Medications:   .  prismasol BGK 4/2.5 500 mL/hr at 05/27/19 0557  .  prismasol BGK 4/2.5 500 mL/hr at 05/27/19 0056  . sodium chloride    . norepinephrine (LEVOPHED) Adult  infusion 11 mcg/min (05/27/19 0700)  . phenylephrine (NEO-SYNEPHRINE) Adult infusion Stopped (05/26/19 2027)  . phenylephrine (NEO-SYNEPHRINE) Adult infusion 400 mcg/min (05/27/19 0700)  . prismasol BGK 4/2.5 2,000 mL/hr at 05/27/19 0336  . sodium chloride    . vasopressin (PITRESSIN) infusion - *FOR SHOCK* 0.04 Units/min (05/27/19 0700)   . sodium chloride   Intravenous Once  . Chlorhexidine Gluconate Cloth  6 each Topical Daily  . mouth rinse  15 mL Mouth Rinse BID  . pantoprazole  40 mg Intravenous Q12H   Place/Maintain arterial line **AND** sodium chloride, acetaminophen **OR** acetaminophen, heparin, levalbuterol, ondansetron (ZOFRAN) IV, sodium chloride, sodium chloride  Assessment/ Plan:   Acute kidney injury with hypotension and ascites with adequate volume resuscitation and oliguric acute renal failure.  Urine sodium less than 10 urinalysis bland.  This is consistent with hepatorenal syndrome.  It could also be consistent with early acute tubular necrosis.  The renal ultrasound did not reveal any evidence of hydronephrosis although did have an absent kidney on the right.  CRRT initiated 7/49/4496  Metabolic acidosis IV sodium bicarbonate discontinued  Hyperkalemia resolved with correction of metabolic acidosis  Hypertension/volume appears adequately volume resuscitated at this point will DC IV fluids.   Hypotension now on 3 pressors Levophed, phenylephrine and vasopressin  Cirrhosis picture appears like hepatorenal syndrome is developing with ascites pleural effusions third spacing urine sodium less than 10.  Octreotide has been discontinued 05/26/2019.  Anemia continue to follow no obvious source of GI blood loss high risk for varices prophylaxis against peptic ulcer disease  SBP prophylaxis.  Rocephin discontinued 05/26/2019  Acute diarrhea with no infectious etiology discovered appears to be slowing.  Acute on chronic liver injury.  INR 3.  Increased liver enzymes   LOS: [place] @TODAY @8 :01 AM

## 2019-05-27 NOTE — Progress Notes (Signed)
[facility] Gastroenterology Progress Note  Richard Saunders 79 y.o. 03/01/1941   Subjective: Intubated this morning. Family at bedside. Nurse in room.  Objective: Vital signs: Vitals:   05/27/19 1100 05/27/19 1115  BP: (!) 116/91   Pulse: (!) 112 (!) 114  Resp: (!) 27 (!) 24  Temp: (!) 95.5 F (35.3 C) (!) 95.5 F (35.3 C)  SpO2: 100% 100%    Physical Exam: Gen: intubated, sedated, elderly  CV: RRR Chest: Coarse breath sounds Abd: soft, nontender, nondistended, +BS Ext: +LE edema  Lab Results: Recent Labs    05/26/19 1600 05/26/19 1600 05/27/19 0338 05/27/19 0900  NA 138   < > 138 139  K 4.8   < > 4.7 4.6  CL 106  --  105  --   CO2 23  --  21*  --   GLUCOSE 139*  --  130*  --   BUN 74*  --  55*  --   CREATININE 2.17*  --  2.02*  --   CALCIUM 8.6*  --  8.1*  --   MG  --   --  2.2  --   PHOS 3.5  --  4.0  --    < > = values in this interval not displayed.   Recent Labs    05/26/19 0425 05/26/19 0425 05/26/19 1600 05/27/19 0338  AST 171*  --   --  296*  ALT 65*  --   --  110*  ALKPHOS 37*  --   --  41  BILITOT 1.5*  --   --  2.0*  PROT 5.0*  --   --  5.2*  ALBUMIN 3.4*   < > 3.5 3.6   < > = values in this interval not displayed.   Recent Labs    05/25/19 0343 05/25/19 1207 05/26/19 0425 05/26/19 0425 05/27/19 0338 05/27/19 0900  WBC 28.4*  --  26.4*  --  23.2*  --   NEUTROABS 23.0*  --   --   --   --   --   HGB 12.3*   < > 11.9*   < > 12.2* 11.6*  HCT 37.3*   < > 36.2*   < > 38.0* 34.0*  MCV 105.1*  --  104.0*  --  107.6*  --   PLT 141*  --  122*  --  65*  --    < > = values in this interval not displayed.      Assessment/Plan: Decompensated cirrhosis and recent peptic ulcer bleed that has not recurred. hgb 11.6. Spontaneous bacterial peritonitis on IV Abx. Renal insufficiency concerning for hepatorenal syndrome managed by nephrology. LFTs rising. Continue supportive care. Will f/u.   Richard Saunders 05/27/2019, 11:24 AM  Questions  please call [phone] ID: Richard Saunders, male   DOB: 14-Sep-1940, 79 y.o.   MRN: [id number]

## 2019-05-27 NOTE — Procedures (Signed)
Intubation Procedure Note Richard Saunders [id number] 06-04-1940  Procedure: Intubation Indications: Respiratory insufficiency  Procedure Details Consent: Risks of procedure as well as the alternatives and risks of each were explained to the (patient/caregiver).  Consent for procedure obtained. Time Out: Verified patient identification, verified procedure, site/side was marked, verified correct patient position, special equipment/implants available, medications/allergies/relevent history reviewed, required imaging and test results available.  Performed  MAC   Evaluation Hemodynamic Status: BP stable throughout; O2 sats: stable throughout Patient's Current Condition: stable Complications: No apparent complications Patient did tolerate procedure well. Chest X-ray ordered to verify placement.  CXR: pending.   [employer] 05/27/2019

## 2019-05-27 NOTE — Progress Notes (Signed)
RT note- sputum sent to lab per order.

## 2019-05-27 NOTE — Progress Notes (Signed)
ABG results reported to Dr.Sood. RN aware

## 2019-05-27 NOTE — Progress Notes (Signed)
Pharmacy Antibiotic Note  Richard Saunders is a 79 y.o. male admitted on 05/23/2019 with sepsis.  Pharmacy has been consulted for vancomycin and cefepime dosing.  Plan: Cefepime 2 g IV every 12 hours Vancomycin 2g IV load followed by 1g IV every 24 hours Monitor CRRT to adjust doses as appropriate Monitor cultures for de-escalation and establish LOT  Height: 6' 1"  (185.4 cm) Weight: 235 lb 0.2 oz (106.6 kg) IBW/kg (Calculated) : 79.9  Temp (24hrs), Avg:96.3 F (35.7 C), Min:94.8 F (34.9 C), Max:98.2 F (36.8 C)  Recent Labs  Lab 05/23/19 2208 05/08/2019 0517 05/10/2019 0517 05/17/2019 0643 05/26/2019 0815 05/19/2019 1800 05/25/19 0343 05/26/19 0425 05/26/19 1600 05/27/19 0338  WBC   < >  --   --  27.1*  --  34.0* 28.4* 26.4*  --  23.2*  CREATININE  --  2.28*   < >  --  2.38*  --  2.50* 2.68* 2.17* 2.02*  LATICACIDVEN  --  3.6*  --   --  2.0*  --   --   --   --   --    < > = values in this interval not displayed.    Estimated Creatinine Clearance: 38.6 mL/min (A) (by C-G formula based on SCr of 2.02 mg/dL (H)).    Allergies  Allergen Reactions  . Colchicine Diarrhea  . Fluticasone Propionate Other (See Comments)    He didn't tolerate the taste/smell prev  . Nasacort [Triamcinolone] Other (See Comments)    Didn't tolerate    Antimicrobials this admission: Cefepime 1/15>1/17, 1/28>> Vancomycin 1/15 x1, 1/28>> Metronidazole 1/15>1/25 Ceftriaxone 1/18>1/27  Microbiology results: 1/15 BCx x2: neg 1/15 COVID/Flu: neg 1/16 C. Diff: neg 1/16 UCx: neg 1/16 RVP: neg 1/17 Peritoneal wash: neg 1/19 L. Pleural fluid: neg 1/19 paracentesis fluid: neg 1/25 MRSA PCR: neg 1/27 L. Pleural fluid: pending    Carliyah Cotterman L. Devin Going, [place] PGY1 Pharmacy Resident [phone] 05/27/19      9:00 AM  Please check AMION for all [place] phone numbers After 10:00 PM, call the [place] [phone]

## 2019-05-27 NOTE — Progress Notes (Signed)
NAME:  Richard Saunders, MRN:  [id number], DOB:  [birth year], LOS: 74 ADMISSION DATE:  05/07/2019, CONSULTATION DATE:  05/15/19 REFERRING MD: Shela Leff, MD  CHIEF COMPLAINT:  Hypotension  Brief History   79 year old male with cirrhosis with SBP- presented with N/V/D and admitted for sepsis secondary to GI etiology. PCCM consulted for hypotension. Following for chronic left pleural effusion.  History of present illness   Richard Saunders is a 79 year old male with the below medical history who presented to the ED for abdominal pain, nausea, vomiting and weakness on 1/13-1/14. He left AMA however his lab results after he left demonstrated elevated lipase 2501. He returned to the ED with similar symptoms as well as new onset diarrhea. Prior to admission he has had poor PO intake, decreased UOP and tachycardia in the last week. His toprolol was increased by his Cardiologist for tachycardia. Of note, he did receive COVID vaccination on 1/13 but states symptoms started prior to this.   In the ED he was hypotensive with BP 85/67. Labs significant for WBC 24.8, K 5.5, BUN/Cr 58/2.6 and LA 4.3. CT A/P with cirrhosis and ascites, pancreatitis and bilateral pleural effusion. ED [place] with no pocket seen for ascites drainage. Given IVF and broad spectrum antibiotics with improvement in hypotension.  1/16, patient with persistent hypotension. Has received total 5.5L overnight and on mIVF 125/hr and was transferred to the ICU, transferred back to the floor on 1/22.  Per nursing report, pt began having dark stool in the afternoon of 1/24 throughout the evening, has remained hypotensive and receive 250cc bolus during the day and 1L overnight..  Hgb dropped 2g from 13 to 11.  Around 7p, stool changed to dark maroon and nursing/patient report at least 5 BM's of dark, red blood. 2 units PRBC's ordered.  INR 2.9 and platelets 161. Heparin gtt stopped at 2026.   Pt sees [facility] and last endoscopy in 2020 with  possible varices.   Past Medical History  Atrial fibrillation on Eliquis, hx of PVS, aortic insufficiency, HTN, non-alcoholic cirrhosis, hx of ascites, anxiety/depression.  Significant Hospital Events   1/13 Presented to ED with N/V/abdominal pain. Left AMA 1/15 Returned to ED 1/24 Admitted to the ICU  Consults:  [facility] PCCM [facility]  Procedures:  1/17 para 1/19 para 1/19 thora 1/26 HD cath placement 1/27 Thora 1/27 HD cath replacement  Significant Diagnostic Tests:  CT A/P Advanced cirrhosis of the liver, ascites, pancreatitis, bilateral pleural effusions (L>R) Ascitic fluid 1/17: 1,365 WBCs (80% PMNs) Ascitic fluid 1/19: WBC 1015 (79% PMNs), LD 112, albumin <1, protein <3, gram stain WBCs L Pleural fluid 1/19: WBC 835 (47% PMNs), LD 122, albumin <1, protein <3, glucose 86, gram stain WBCs   Upper endoscopy: Impression - Normal larynx. - Grade I esophageal varices. - Non-bleeding gastric ulcer with no stigmata of bleeding. - Acute gastritis. - Non-bleeding duodenal ulcers with pigmented material. - Normal second portion of the duodenum and third portion of the duodenum. - The examination was otherwise normal. - No specimens collected.  CT Chest wo contrast: IMPRESSION: 1. 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 No evidence of partial anomalous pulmonary venous return on this noncontrast examination. No pneumothorax. 2. Complete atelectasis/collapse of the left lower lobe and lingula with partial atelectasis/collapse of the right lower lobe with associated air bronchograms. Concomitant infection not excluded on the basis of this examination. 3. Hepatic  cirrhosis with moderate volume intra-abdominal ascites. No evidence of splenomegaly. 4. Age-indeterminate mild (approximately 25%) compression deformity involving the superior endplate of the T3 vertebral body. Correlation for point  tenderness at this location is advised.  Micro Data:  1/15 Sars-Cov-2>> neg 1/15 BCx2>> no growth 1/16 C.diff>> negative 1/17 Ascitic fluid- few WBC >> no growth 1/16 Urine culture >> NG 1/27 Pleural fluid NGTD  Antimicrobials:  Vanc 1/15 > 1/15 Cefepime 1/15> 1/17 Flagyl 1/15 >> Ceftriaxone 1/18>>  Interim history/subjective:  The patient stated that he is unable to sleep well overnight due to recurrent bowel movements. He denied chest pressure today. We did not approach the topic of Code status but will address this with his daughter and him when she arrives today.   Objective   Blood pressure 95/65, pulse (!) 111, temperature (!) 95 F (35 C), resp. rate 15, height 6' 1"  (1.854 m), weight 106.6 kg, SpO2 95 %. CVP:  [7 mmHg-12 mmHg] 7 mmHg      Intake/Output Summary (Last 24 hours) at 05/27/2019 0652 Last data filed at 05/27/2019 0600 Gross per 24 hour  Intake 3949.66 ml  Output 1959 ml  Net 1990.66 ml   Filed Weights   05/19/19 0400 05/26/19 0400 05/27/19 0349  Weight: 90.5 kg 103.2 kg 106.6 kg   Physical Exam: General: A/O x4, in no acute distress, afebrile, nondiaphoretic HEENT: PEERL, EMO intact Cardio: RRR, no mrg's  Pulmonary: There are bilateral rhonchi on exam today consistent with upper airway congestion, decreased breath sounds on the left lung worse than prior day Abdomen: Bowel sounds normal, softer than the prior day, nontender  MSK: BLE nontender, worsening edema bilaterally Neuro: Alert, onversational  Psych: Appropriate affect, not depressed in appearance, engages well  I/O: Total In 16.9L, out 4.6L, past 24 hrs in 3.7L, out 1.9L  Resolved Hospital Problem list   Hypoxic respiratory failure, sepsis   Assessment & Plan:  Cirrhosis Shock Likely 2/2 to SBP, upper GI bleed and hepatorenal syndrome in the setting of cirrhosis. I am less optimistic today than I was the prior day given his increased pressor requirements and the CXR from the morning of  1/28 with progression of his pleural effusion.  "Upper endoscopy with nonbleeding ulceration and G1 esophageal varices found. GI recommending a clear liquid diet for 2 days, continuing present medications, likely discontinuing anticoagulation or antiplatelet agent with his INR being greater than 2 at baseline. He is to teturn to the GI clinic PRN. No aspirin or nonsteroidals until ulcers are healed. Echo minimally remarkable, not diagnostic. Hemodialysis catheter in the left SVC per CT Chest has been replaced overnight.  Plan  -Trend CBC for ongoing blood loss daily -Levophed increased dosing 0-12mg/min overnight but rate remains low -Continue clear liquid diet x 2 days with observation per GI -Continue protonix Q12 hours IV  Acute hypoxic respiratory failure, L pleural effusion Interval worsening of the left sided pleural effusion. CT demonstrating complete collapse of left lower lobe and lingula vs atelectasis. Likely 2/2 to ascites from cirrhosis although this is primarily left sided. Prior thoracentesis with transudative fluid this admission. Albumin surprisingly WNL's at 3.6. S/p thoracentesis 1/19. May need Lasix as he is up ~10L since admission once his BP stabilizes. S/p Thoracentesis 1/27 with 1L removed that is transudative.  Plan -Continue supplemental O2, repeat CXR in am after volume resuscitation,  -Continue duonebs  AKI Hepatorenal syndrome  2/2 to SBP and upper GI bleed in the setting of cirrhosis. Nephrology following for CRRT. Nursing  staff unable to pull or even remain net zero overnight due to hypotension.  Plan -Continue to monitor BMPs and uop -Continue CRRT per nephrology -Appreciate nephrologies recommendations   SBP CT chest on 1/26 demonstrating increased abdominal ascites. Ascitic fluid cultures without growth to date, will need lifelong prophylaxis. WBC increasing but afebrile. BP remains soft which in the setting of cirrhosis with ascites could be portal  hypertension. Plan -Consider broadening antibiotics today and repeat paracentesis  -Continue ceftriaxone and Flagyl -Switch to 160/853m Bactrim at discharge  Acute upper GI bleed Appears to have slowed vs stopped for now. GI continuing to follow. Appreciate their recommendations. Would like to advance patient to full liquids today to increase his protein intake.   Hyperkalemia: Resolved. EKG unremarkable.  Plan -Continue Kayexalate PRN -Trend CMP daily  Afib with RVR -d/c heparin and hold metoprolol in the setting of the above, HR 80's overnight Plan -resume rate control with beta blocker when able  Best practice:  Diet: Clears Pain/Anxiety/Delirium protocol (if indicated): -- VAP protocol (if indicated): -- DVT prophylaxis: SCD GI prophylaxis: protonix Glucose control: Per primary Mobility: As tolerated Code Status: Full Disposition:  ICU  Labs   CBC: Recent Labs  Lab 05/23/19 2208 05/23/19 2208 05/07/2019 0643 05/26/2019 1019 05/22/2019 1800 05/25/19 0343 05/25/19 1207 05/26/19 0425 05/27/19 0338  WBC 26.3*   < > 27.1*  --  34.0* 28.4*  --  26.4* 23.2*  NEUTROABS 20.0*  --   --   --   --  23.0*  --   --   --   HGB 11.6*   < > 12.0*   < > 13.0 12.3* 11.9* 11.9* 12.2*  HCT 36.0*   < > 36.3*   < > 40.1 37.3* 35.0* 36.2* 38.0*  MCV 113.9*   < > 104.6*  --  105.2* 105.1*  --  104.0* 107.6*  PLT 161   < > 139*  --  172 141*  --  122* 65*   < > = values in this interval not displayed.   Basic Metabolic Panel: Recent Labs  Lab 05/29/2019 0815 05/13/2019 0815 05/25/19 0343 05/25/19 1207 05/26/19 0425 05/26/19 1600 05/27/19 0338  NA 138   < > 139 140 139 138 138  K 5.7*   < > 5.8* 5.5* 4.8 4.8 4.7  CL 108  --  110  --  107 106 105  CO2 21*  --  19*  --  23 23 21*  GLUCOSE 109*  --  132*  --  173* 139* 130*  BUN 86*  --  94*  --  94* 74* 55*  CREATININE 2.38*  --  2.50*  --  2.68* 2.17* 2.02*  CALCIUM 9.7  --  9.4  --  9.2 8.6* 8.1*  MG  --   --   --   --   --   --   2.2  PHOS  --   --   --   --   --  3.5 4.0   < > = values in this interval not displayed.   GFR: Estimated Creatinine Clearance: 38.6 mL/min (A) (by C-G formula based on SCr of 2.02 mg/dL (H)). Recent Labs  Lab 04/30/2019 0517 05/20/2019 0643 05/08/2019 0815 05/05/2019 1800 05/25/19 0343 05/26/19 0425 05/27/19 0338  WBC  --    < >  --  34.0* 28.4* 26.4* 23.2*  LATICACIDVEN 3.6*  --  2.0*  --   --   --   --    < > =  values in this interval not displayed.   Liver Function Tests: Recent Labs  Lab 05/23/19 0230 05/23/19 0230 05/25/2019 0517 05/25/19 0343 05/26/19 0425 05/26/19 1600 05/27/19 0338  AST 24  --  94* 127* 171*  --  296*  ALT 15  --  60* 54* 65*  --  110*  ALKPHOS 34*  --  31* 34* 37*  --  41  BILITOT 1.3*  --  1.0 2.0* 1.5*  --  2.0*  PROT 5.5*  --  5.2* 5.4* 5.0*  --  5.2*  ALBUMIN 3.8   < > 3.6 3.7 3.4* 3.5 3.6   < > = values in this interval not displayed.   No results for input(s): LIPASE, AMYLASE in the last 168 hours. No results for input(s): AMMONIA in the last 168 hours. ABG    Component Value Date/Time   HCO3 22.5 05/25/2019 1207   TCO2 24 05/25/2019 1207   ACIDBASEDEF 6.0 (H) 05/25/2019 1207   O2SAT 65.0 05/25/2019 1207    Coagulation Profile: Recent Labs  Lab 05/23/19 2208 05/26/19 0425 05/27/19 0338  INR 2.6* 2.4* 3.0*   Cardiac Enzymes: No results for input(s): CKTOTAL, CKMB, CKMBINDEX, TROPONINI in the last 168 hours.  HbA1C: Hgb A1c MFr Bld  Date/Time Value Ref Range Status  07/29/2016 09:14 AM 5.2 4.6 - 6.5 % Final    Comment:    Glycemic Control Guidelines for People with Diabetes:Non Diabetic:  <6%Goal of Therapy: <7%Additional Action Suggested:  >8%   07/22/2012 09:41 AM 5.5 4.6 - 6.5 % Final    Comment:    Glycemic Control Guidelines for People with Diabetes:Non Diabetic:  <6%Goal of Therapy: <7%Additional Action Suggested:  >8%    CRITICAL CARE  Kathi Ludwig, MD [place] Resident, PGY-3  Please see  Attending A/P and/or Addendum for final recommendations.

## 2019-05-27 NOTE — Progress Notes (Signed)
Attempted Rt IJ and Rt [place] CVL.  Vessel visualized easily in both locations with ultrasound.  Able to easily locate vessel in both locations with needle.  In each location when advancing guidewire, the was obstruction from introducing guidewire fully.  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  Procedure aborted in both locations.  He already has HD catheter in [place] location.  Will attempt femoral central venous access.  Chesley Mires, MD [facility] Pulmonary/Critical Care 05/27/2019, 3:55 PM '

## 2019-05-27 NOTE — Procedures (Signed)
Central Venous Hemodialysis Catheter Insertion Procedure Note Richard Saunders [id number] Dec 08, 1940  Procedure: Insertion of Central Venous Hemodialysis Catheter Indications: CRRT and existing IJ trialysis catheter not running on CRRT; pressure alarms high.  Was not able to return blood on last filter.   Procedure Details Consent: Risks of procedure as well as the alternatives and risks of each were explained to the (patient/caregiver).  Consent for procedure obtained. Time Out: Verified patient identification, verified procedure, site/side was marked, verified correct patient position, special equipment/implants available, medications/allergies/relevent history reviewed, required imaging and test results available.  Performed  Maximum sterile technique was used including antiseptics, cap, gloves, gown, hand hygiene, mask and sheet. Skin prep: Chlorhexidine;  An existing left trialysis 24 cm catheter in place.  Area prepped and new 15 cm trialysis catheter exchanged over wire.  Line sutured.  Biopatch and sterile dressing applied.     Noted to have immediate improvement in BP after removal of existing catheter and improvement in rhythm.    Evaluation Blood flow good Complications: No apparent complications Patient did tolerate procedure well. Chest X-ray ordered to verify placement.  CXR: pending.  Kennieth Rad, MSN, AGACNP-BC [facility] 05/27/2019, 12:34 AM

## 2019-05-27 NOTE — Progress Notes (Signed)
[place] Progress Note Patient Name: Richard Saunders DOB: November 20, 1940 MRN: [id number]   Date of Service  05/27/2019  HPI/Events of Note  Pt with hypotension.  eICU Interventions  Albumin 25 % 25 gm iv bolus x 1, Vasopressin increased to 0.04 mcg        Arelis Neumeier U Jushua Waltman 05/27/2019, 4:14 AM

## 2019-05-27 NOTE — Progress Notes (Signed)
Initial Nutrition Assessment  DOCUMENTATION CODES:   Not applicable  INTERVENTION:     Vital High Protein at 20 ml/h   If patient tolerates trickle TF today, increase to goal rate of 60 ml/h (1440 ml per day) tomorrow.   Pro-stat 30 ml QID.   Provides 1840 kcal, 186 gm protein, 1204 ml free water daily  NUTRITION DIAGNOSIS:   Increased nutrient needs related to acute illness(AKI on CRRT, hepatorenal syndrome) as evidenced by estimated needs.  GOAL:   Patient will meet greater than or equal to 90% of their needs  MONITOR:   Vent status, TF tolerance, Labs, I & O's, Weight trends, Skin  REASON FOR ASSESSMENT:   Ventilator, Consult Enteral/tube feeding initiation and management  ASSESSMENT:   79 yo male admitted with sepsis. Now with hepatorenal syndrome, spontaneous bacterial peritonitis. PMH includes PVS, aortic insufficiency, HTN, cirrhosis, ascites.   S/P paracentesis on 1/17 and 1/19. S/P thoracentesis on 1/19 and 1/27. HD cath placed 1/26, replaced 1/27. CRRT started on 1/27. Unable to remove volume or remain even last night due to hypotension.   Patient required intubation earlier today. Received MD Consult for TF initiation and management.  Patient is currently intubated on ventilator support, requiring 3 pressors. MV: 11.3 L/min Temp (24hrs), Avg:96.1 F (35.6 C), Min:94.8 F (34.9 C), Max:98.2 F (36.8 C)   Chest x-ray 1/28 showed progression of pleural effusion. Pressor requirement is increasing.  Labs reviewed. BUN 55 (H), creatinine 2.02 (H), ionized calcium 1.09 (L) CBG's: 108 1/27  Medications reviewed and include precedex, flagyl, levophed, phenylephrine, vasopressin.  Admission weight 85.8 kg 05/10/2019 (lowest weight since admission/dry weight) Weight is up by 20.8 kg since admission, related to positive volume status. I/O +17.7 L since admission. +3 pitting edema to all extremities today per RN documentation.  Intake/Output Summary  (Last 24 hours) at 05/27/2019 1519 Last data filed at 05/27/2019 1500 Gross per 24 hour  Intake 3960.3 ml  Output 1628 ml  Net 2332.3 ml    NUTRITION - FOCUSED PHYSICAL EXAM:  unable to complete  Diet Order:   Diet Order            Diet NPO time specified  Diet effective now              EDUCATION NEEDS:   Not appropriate for education at this time  Skin:  Skin Assessment: Reviewed RN Assessment  Last BM:  1/28 type 7  Height:   Ht Readings from Last 1 Encounters:  05/27/19 6' 1"  (1.854 m)    Weight:   Wt Readings from Last 1 Encounters:  05/27/19 106.6 kg  Admission weight 85.8 kg  Ideal Body Weight:  83.6 kg  BMI:  Body mass index is 31.01 kg/m.  Estimated Nutritional Needs:   Kcal:  1890  Protein:  175-200 gm  Fluid:  >/= 1.9 L    Molli Barrows, RD, LDN, [place] Pager [phone] After Hours Pager [phone]

## 2019-05-27 NOTE — Procedures (Signed)
Bronchoscopy  Indication: Left lung down  Consent: Obtained from daughter at bedside  Anesthesia: Versed 47m x 1  Procedure - Timeout performed - Bronchoscope advanced through ETT - Airways examined down to subsegmental level - Following airway examination, BAL in LLL, clear return  Findings - ETT in good position - Airways do not appear inflammed - There is extrinsic compression of left upper and lower lobes  Specimen(s): BAL LLL  Complications: none immediate

## 2019-05-28 DIAGNOSIS — Z515 Encounter for palliative care: Secondary | ICD-10-CM

## 2019-05-28 DIAGNOSIS — K767 Hepatorenal syndrome: Secondary | ICD-10-CM

## 2019-05-28 LAB — CBC
HCT: 34.1 % — ABNORMAL LOW (ref 39.0–52.0)
Hemoglobin: 11.1 g/dL — ABNORMAL LOW (ref 13.0–17.0)
MCH: 34.4 pg — ABNORMAL HIGH (ref 26.0–34.0)
MCHC: 32.6 g/dL (ref 30.0–36.0)
MCV: 105.6 fL — ABNORMAL HIGH (ref 80.0–100.0)
Platelets: 34 10*3/uL — ABNORMAL LOW (ref 150–400)
RBC: 3.23 MIL/uL — ABNORMAL LOW (ref 4.22–5.81)
RDW: 20.2 % — ABNORMAL HIGH (ref 11.5–15.5)
WBC: 16.8 10*3/uL — ABNORMAL HIGH (ref 4.0–10.5)
nRBC: 0.5 % — ABNORMAL HIGH (ref 0.0–0.2)

## 2019-05-28 LAB — POCT I-STAT 7, (LYTES, BLD GAS, ICA,H+H)
Acid-base deficit: 2 mmol/L (ref 0.0–2.0)
Bicarbonate: 20.6 mmol/L (ref 20.0–28.0)
Calcium, Ion: 1.08 mmol/L — ABNORMAL LOW (ref 1.15–1.40)
HCT: 32 % — ABNORMAL LOW (ref 39.0–52.0)
Hemoglobin: 10.9 g/dL — ABNORMAL LOW (ref 13.0–17.0)
O2 Saturation: 97 %
Patient temperature: 35.4
Potassium: 4.1 mmol/L (ref 3.5–5.1)
Sodium: 138 mmol/L (ref 135–145)
TCO2: 21 mmol/L — ABNORMAL LOW (ref 22–32)
pCO2 arterial: 27.3 mmHg — ABNORMAL LOW (ref 32.0–48.0)
pH, Arterial: 7.479 — ABNORMAL HIGH (ref 7.350–7.450)
pO2, Arterial: 74 mmHg — ABNORMAL LOW (ref 83.0–108.0)

## 2019-05-28 LAB — COMPREHENSIVE METABOLIC PANEL
ALT: 256 U/L — ABNORMAL HIGH (ref 0–44)
AST: 739 U/L — ABNORMAL HIGH (ref 15–41)
Albumin: 3.5 g/dL (ref 3.5–5.0)
Alkaline Phosphatase: 44 U/L (ref 38–126)
Anion gap: 11 (ref 5–15)
BUN: 26 mg/dL — ABNORMAL HIGH (ref 8–23)
CO2: 19 mmol/L — ABNORMAL LOW (ref 22–32)
Calcium: 8.1 mg/dL — ABNORMAL LOW (ref 8.9–10.3)
Chloride: 107 mmol/L (ref 98–111)
Creatinine, Ser: 1.54 mg/dL — ABNORMAL HIGH (ref 0.61–1.24)
GFR calc Af Amer: 49 mL/min — ABNORMAL LOW (ref >60)
GFR calc non Af Amer: 43 mL/min — ABNORMAL LOW (ref >60)
Glucose, Bld: 100 mg/dL — ABNORMAL HIGH (ref 70–99)
Potassium: 4.1 mmol/L (ref 3.5–5.1)
Sodium: 137 mmol/L (ref 135–145)
Total Bilirubin: 5 mg/dL — ABNORMAL HIGH (ref 0.3–1.2)
Total Protein: 5.1 g/dL — ABNORMAL LOW (ref 6.5–8.1)

## 2019-05-28 LAB — PHOSPHORUS: Phosphorus: 2.1 mg/dL — ABNORMAL LOW (ref 2.5–4.6)

## 2019-05-28 LAB — GLUCOSE, CAPILLARY
Glucose-Capillary: 95 mg/dL (ref 70–99)
Glucose-Capillary: 79 mg/dL (ref 70–99)
Glucose-Capillary: 100 mg/dL — ABNORMAL HIGH (ref 70–99)

## 2019-05-28 LAB — MAGNESIUM: Magnesium: 2.3 mg/dL (ref 1.7–2.4)

## 2019-05-28 MED ORDER — POLYVINYL ALCOHOL 1.4 % OP SOLN
1.0000 [drp] | Freq: Four times a day (QID) | OPHTHALMIC | Status: DC | PRN
  Filled 2019-05-28: qty 15

## 2019-05-28 MED ORDER — ONDANSETRON HCL 4 MG/2ML IJ SOLN: 4.0000 mg | Freq: Four times a day (QID) | INTRAMUSCULAR | Status: DC | PRN

## 2019-05-28 MED ORDER — GLYCOPYRROLATE 1 MG PO TABS: 1.0000 mg | ORAL_TABLET | ORAL | Status: DC | PRN

## 2019-05-28 MED ORDER — SODIUM CHLORIDE 0.9 % IV SOLN
0.5000 mg/h | INTRAVENOUS | Status: DC
  Administered 2019-05-28: 0.5 mg/h via INTRAVENOUS
  Filled 2019-05-28: qty 5

## 2019-05-28 MED ORDER — LORAZEPAM 2 MG/ML PO CONC: 1.0000 mg | ORAL | Status: DC | PRN

## 2019-05-28 MED ORDER — HALOPERIDOL 0.5 MG PO TABS
0.5000 mg | ORAL_TABLET | ORAL | Status: DC | PRN
  Filled 2019-05-28: qty 1

## 2019-05-28 MED ORDER — DIPHENHYDRAMINE HCL 50 MG/ML IJ SOLN: 25.0000 mg | INTRAMUSCULAR | Status: DC | PRN

## 2019-05-28 MED ORDER — BIOTENE DRY MOUTH MT LIQD: 15.0000 mL | OROMUCOSAL | Status: DC | PRN

## 2019-05-28 MED ORDER — GLYCOPYRROLATE 0.2 MG/ML IJ SOLN: 0.4000 mg | INTRAMUSCULAR | Status: DC | PRN

## 2019-05-28 MED ORDER — HALOPERIDOL LACTATE 2 MG/ML PO CONC
0.5000 mg | ORAL | Status: DC | PRN
  Filled 2019-05-28: qty 0.3

## 2019-05-28 MED ORDER — GLYCOPYRROLATE 0.2 MG/ML IJ SOLN: 0.2000 mg | INTRAMUSCULAR | Status: DC | PRN

## 2019-05-28 MED ORDER — DEXTROSE 5 % IV SOLN: INTRAVENOUS | Status: DC

## 2019-05-28 MED ORDER — ACETAMINOPHEN 650 MG RE SUPP: 650.0000 mg | Freq: Four times a day (QID) | RECTAL | Status: DC | PRN

## 2019-05-28 MED ORDER — LORAZEPAM 2 MG/ML IJ SOLN
1.0000 mg | INTRAMUSCULAR | Status: DC | PRN
  Administered 2019-05-28: 1 mg via INTRAVENOUS
  Filled 2019-05-28: qty 1

## 2019-05-28 MED ORDER — ALBUMIN HUMAN 25 % IV SOLN
12.5000 g | Freq: Once | INTRAVENOUS | Status: AC
  Administered 2019-05-28: 12.5 g via INTRAVENOUS
  Filled 2019-05-28: qty 50

## 2019-05-28 MED ORDER — ACETAMINOPHEN 325 MG PO TABS: 650.0000 mg | ORAL_TABLET | Freq: Four times a day (QID) | ORAL | Status: DC | PRN

## 2019-05-28 MED ORDER — HALOPERIDOL LACTATE 5 MG/ML IJ SOLN: 0.5000 mg | INTRAMUSCULAR | Status: DC | PRN

## 2019-05-28 MED ORDER — HYDROMORPHONE BOLUS VIA INFUSION
0.5000 mg | INTRAVENOUS | Status: DC | PRN
  Administered 2019-05-28: 1 mg via INTRAVENOUS
  Filled 2019-05-28: qty 2

## 2019-05-28 MED ORDER — ONDANSETRON 4 MG PO TBDP: 4.0000 mg | ORAL_TABLET | Freq: Four times a day (QID) | ORAL | Status: DC | PRN

## 2019-05-28 MED ORDER — LORAZEPAM 2 MG/ML IJ SOLN
2.0000 mg | Freq: Once | INTRAMUSCULAR | Status: AC
  Administered 2019-05-28: 2 mg via INTRAVENOUS
  Filled 2019-05-28: qty 1

## 2019-05-28 MED ORDER — HYDROMORPHONE BOLUS VIA INFUSION: 0.5000 mg | INTRAVENOUS | Status: DC | PRN

## 2019-05-28 MED ORDER — POLYVINYL ALCOHOL 1.4 % OP SOLN: 1.0000 [drp] | Freq: Four times a day (QID) | OPHTHALMIC | Status: DC | PRN

## 2019-05-28 MED ORDER — LORAZEPAM 1 MG PO TABS: 1.0000 mg | ORAL_TABLET | ORAL | Status: DC | PRN

## 2019-05-29 LAB — CULTURE, RESPIRATORY W GRAM STAIN: Culture: NORMAL

## 2019-05-29 LAB — BODY FLUID CULTURE: Culture: NO GROWTH

## 2019-05-30 LAB — CULTURE, RESPIRATORY W GRAM STAIN: Culture: NO GROWTH

## 2019-05-31 NOTE — Progress Notes (Signed)
Events of today noted case discussed with PA who saw him earlier and his nurse he passed without any signs of bleeding from our standpoint but unsure of his exact cause of death other than worsening nonresponding hypo hypertension

## 2019-05-31 NOTE — Plan of Care (Signed)
Updated daughter at bedside today. She stated that the patient has a few brother who may want to visit but he does not otherwise have family.  I again updated her on his status. We discussed code status and the various forms of intervention that we typically perform during a code event including CPR (chest compressions), intubation, cardiac and pressor support as well as cardioversion (electrical shock of the heart). Based on his current status and full vent/pressor support at this time I recommended that CPR not be performed as it would cause more harm than good. She understood this and agreed that chest compressions would not be something that he wanted in this condition nor would they likely change the outcome. She understood that more harm could come from them as well.   Chart updated to reflect code status.  Continue current supportive therapy for 24-48 hours. Continue discussion with palliative care regarding plan if he does not improve  Kathi Ludwig, MD [facility] Internal Medicine, PGY-3

## 2019-05-31 NOTE — Progress Notes (Signed)
NAME:  Richard Saunders, MRN:  [id number], DOB:  1941/03/29, LOS: 80 ADMISSION DATE:  05/29/2019, CONSULTATION DATE:  05/15/19 REFERRING MD: Shela Leff, MD  CHIEF COMPLAINT:  Hypotension  Brief History   79 year old male with cirrhosis with SBP- presented with N/V/D and admitted for sepsis secondary to GI etiology. PCCM consulted for hypotension. Following for chronic left pleural effusion.  History of present illness   Mr. Suhail Peloquin is a 79 year old male with the below medical history who presented to the ED for abdominal pain, nausea, vomiting and weakness on 1/13-1/14. He left AMA however his lab results after he left demonstrated elevated lipase 2501. He returned to the ED with similar symptoms as well as new onset diarrhea. Prior to admission he has had poor PO intake, decreased UOP and tachycardia in the last week. His toprolol was increased by his Cardiologist for tachycardia. Of note, he did receive COVID vaccination on 1/13 but states symptoms started prior to this.   In the ED he was hypotensive with BP 85/67. Labs significant for WBC 24.8, K 5.5, BUN/Cr 58/2.6 and LA 4.3. CT A/P with cirrhosis and ascites, pancreatitis and bilateral pleural effusion. ED [place] with no pocket seen for ascites drainage. Given IVF and broad spectrum antibiotics with improvement in hypotension.  1/16, patient with persistent hypotension. Has received total 5.5L overnight and on mIVF 125/hr and was transferred to the ICU, transferred back to the floor on 1/22.  Per nursing report, pt began having dark stool in the afternoon of 1/24 throughout the evening, has remained hypotensive and receive 250cc bolus during the day and 1L overnight..  Hgb dropped 2g from 13 to 11.  Around 7p, stool changed to dark maroon and nursing/patient report at least 5 BM's of dark, red blood. 2 units PRBC's ordered.  INR 2.9 and platelets 161. Heparin gtt stopped at 2026.   Pt sees [facility] and last endoscopy in 2020 with  possible varices.   Past Medical History  Atrial fibrillation on Eliquis, hx of PVS, aortic insufficiency, HTN, non-alcoholic cirrhosis, hx of ascites, anxiety/depression.  Significant Hospital Events   1/13 Presented to ED with N/V/abdominal pain. Left AMA 1/15 Returned to ED 1/24 Admitted to the ICU  Consults:  [facility] PCCM [facility]  Procedures:  1/17 para 1/19 para 1/19 thora 1/26 HD cath placement 1/27 Thora 1/27 HD cath replacement 1/28 Intubated 1/28 Femoral line placement 1/28 Bronchoscopy   Significant Diagnostic Tests:  CT A/P Advanced cirrhosis of the liver, ascites, pancreatitis, bilateral pleural effusions (L>R) Ascitic fluid 1/17: 1,365 WBCs (80% PMNs) Ascitic fluid 1/19: WBC 1015 (79% PMNs), LD 112, albumin <1, protein <3, gram stain WBCs L Pleural fluid 1/19: WBC 835 (47% PMNs), LD 122, albumin <1, protein <3, glucose 86, gram stain WBCs   Upper endoscopy: Impression - Normal larynx. - Grade I esophageal varices. - Non-bleeding gastric ulcer with no stigmata of bleeding. - Acute gastritis. - Non-bleeding duodenal ulcers with pigmented material. - Normal second portion of the duodenum and third portion of the duodenum. - The examination was otherwise normal. - No specimens collected.  CT Chest wo contrast: IMPRESSION: 1. 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 No evidence of partial anomalous pulmonary venous return on this noncontrast examination. No pneumothorax. 2. Complete atelectasis/collapse of the left lower lobe and lingula with partial atelectasis/collapse of the right lower lobe with associated air bronchograms. Concomitant infection not  excluded on the basis of this examination. 3. Hepatic cirrhosis with moderate volume intra-abdominal ascites. No evidence of splenomegaly. 4. Age-indeterminate mild (approximately 25%) compression deformity involving the  superior endplate of the T3 vertebral body. Correlation for point tenderness at this location is advised.  Micro Data:  1/15 Sars-Cov-2>> neg 1/15 BCx2>> no growth 1/16 C.diff>> negative 1/17 Ascitic fluid- few WBC >> no growth 1/16 Urine culture >> NG 1/27 Pleural fluid NGTD 1/28 BAL >>  Antimicrobials:  Vanc 1/15 >> 1/15 Vanc 1/28 >> Cefepime 1/15>> 1/17 Cefepime 1/28 >> Flagyl 1/15 > x Flagyl 1/28 >> Ceftriaxone 1/18>> 1/28  Interim history/subjective:  No events overnight per nursing. Patient intubated.   Objective   Blood pressure (!) 89/49, pulse (!) 128, temperature (!) 95.9 F (35.5 C), resp. rate (!) 26, height _0  (1.854 m), weight 106.1 kg, SpO2 99 %. CVP:  [5 mmHg-85 mmHg] 9 mmHg  Vent Mode: PRVC FiO2 (%):  [40 %-100 %] 40 % Set Rate:  [23 bmp-26 bmp] 23 bmp Vt Set:  [480 mL] 480 mL PEEP:  [5 cmH20] 5 cmH20 Plateau Pressure:  [18 cmH20-22 cmH20] 18 cmH20   Intake/Output Summary (Last 24 hours) at June 24, 2019 0735 Last data filed at Jun 24, 2019 0700 Gross per 24 hour  Intake 3661.28 ml  Output 3116 ml  Net 545.28 ml   Filed Weights   05/26/19 0400 05/27/19 0349 06/24/19 0417  Weight: 103.2 kg 106.6 kg 106.1 kg   Physical Exam: General: Intubated and sedated, afebrile, nondiaphoretic Cardio: Tachycardic today, no mrg's Pulmonary: Slight improvement in aeration bilaterally with increasing breath sounds on the left. No wheezing Abdomen: Bowel sounds normal, softer, nontender MSK: BLE nontender, grossly edematous, now weeping  Neuro: Sedated and intubated, moves head to verbal responses, moves feet spontaneously Skin: Pale, weeping lower extremities  I/O: Total In 20L, out 7L, past 24 hrs in 3.5L, out 3L  Resolved Hospital Problem list     Assessment & Plan:  Cirrhosis Shock Sepsis Greater concern yesterday for sepsis. Also contributing his SBP, upper GI bleed and hepatorenal syndrome in the setting of cirrhosis. He continues to decompensate. A  bronch was completed on 1/28 which demonstrated extrinsic compression of the left upper and lower lobes, BAL in LLL performed. Also contributing is a degree of NO production which occurs late in progressive cirrhosis leading to marked vasodilation systemically. There is again an increase in his AST/ALT levels today with a worrisome decrease in his platelets and hopeful decrease in his WBC. Prognosis remains guarded. I am concerned that he is not improving. We plan to continue current supportive therapy for at least 48-72 hours per discussion with family.   Plan  -Broadened antibiotic regimen as above -Trend CBC for ongoing blood loss daily -Levophed increased dosing 0-68mg/min  -Epinephrine 0-4051m/min  -F/up BAL -trend vitals and CBC -Vasopressin -Continue CRRT -Started tube feeding -Continue protonix   Acute hypoxic respiratory failure, L pleural effusion Patient decompensated due to worsening consolidation and left sided pleural effusion. Due to increased work of breathing and acidemia with mild hypercapnia the decision was made with the patient, daughter and team to intubate.  Plan -Wean daily -Fentanyl and precedex for sedation with intermittent versed PRN for RASS 0/-1 -VAP protocol -Keep Head of bed elevated -Continue oral care   AKI Hepatorenal syndrome  2/2 to SBP and upper GI bleed in the setting of cirrhosis. Nephrology following for CRRT. Nursing staff unable to pull fluid or even remain net zero overnight due to hypotension. Patient  net positive daily.  Plan -Continue to monitor BMPs and uop -Continue CRRT per nephrology -Appreciate nephrologies recommendations   SBP CT chest on 1/26 demonstrating increased abdominal ascites. Ascitic fluid cultures without growth to date, will need lifelong prophylaxis. WBC increasing but afebrile. BP remains soft which in the setting of cirrhosis with ascites could be portal hypertension. Plan -Broadened coverage as above. -Switch to  160/875m Bactrim at discharge  Acute upper GI bleed Appears to have slowed vs stopped for now. GI continuing to follow. Appreciate their recommendations. Would like to advance patient to full liquids today to increase his protein intake.   Afib with RVR -d/c heparin and hold metoprolol in the setting of the above, HR 80's overnight Plan -resume rate control with beta blocker when able  Best practice:  Diet: Tube feeding Pain/Anxiety/Delirium protocol (if indicated): Fentanyl, versed, precedex, delirium precautions VAP protocol (if indicated): Ordered DVT prophylaxis: SCD GI prophylaxis: protonix Glucose control: Not indicated Mobility: Sedeated Code Status: Full Disposition:  ICU  Labs   CBC: Recent Labs  Lab 05/23/19 2208 05/06/2019 0643 05/26/2019 1800 05/27/2019 1800 05/25/19 0343 05/25/19 1207 05/26/19 0425 05/26/19 0425 05/27/19 0338 05/27/19 0900 05/27/19 1140 [id number] 0Feb 23, [id number]  WBC 26.3*   < > 34.0*  --  28.4*  --  26.4*  --  23.2*  --   --  16.8*  --   NEUTROABS 20.0*  --   --   --  23.0*  --   --   --   --   --   --   --   --   HGB 11.6*   < > 13.0   < > 12.3*   < > 11.9*   < > 12.2* 11.6* 11.2* 11.1* 10.9*  HCT 36.0*   < > 40.1   < > 37.3*   < > 36.2*   < > 38.0* 34.0* 33.0* 34.1* 32.0*  MCV 113.9*   < > 105.2*  --  105.1*  --  104.0*  --  107.6*  --   --  105.6*  --   PLT 161   < > 172  --  141*  --  122*  --  65*  --   --  34*  --    < > = values in this interval not displayed.   Basic Metabolic Panel: Recent Labs  Lab 05/26/19 0425 05/26/19 0425 05/26/19 1600 05/26/19 1600 05/27/19 0338 05/27/19 0338 05/27/19 0900 05/27/19 1140 05/27/19 1644 [id number]  NA 139   < > 138   < > 138   < > 139 139 137 137 138  K 4.8   < > 4.8   < > 4.7   < > 4.6 4.5 4.6 4.1 4.1  CL 107  --  106  --  105  --   --   --  105 107  --   CO2 23  --  23  --  21*  --   --   --  18* 19*  --   GLUCOSE 173*  --  139*  --  130*  --   --   --  105*  100*  --   BUN 94*  --  74*  --  55*  --   --   --  33* 26*  --   CREATININE 2.68*  --  2.17*  --  2.02*  --   --   --  1.70* 1.54*  --  CALCIUM 9.2  --  8.6*  --  8.1*  --   --   --  8.4* 8.1*  --   MG  --   --   --   --  2.2  --   --   --   --  2.3  --   PHOS  --   --  3.5  --  4.0  --   --   --  2.7 2.1*  --    < > = values in this interval not displayed.   GFR: Estimated Creatinine Clearance: 50.5 mL/min (A) (by C-G formula based on SCr of 1.54 mg/dL (H)). Recent Labs  Lab 05/03/2019 0517 05/18/2019 0643 05/23/2019 0815 05/19/2019 1800 05/25/19 0343 05/26/19 0425 05/27/19 0338 01-Jun-2019 0354  WBC  --    < >  --    < > 28.4* 26.4* 23.2* 16.8*  LATICACIDVEN 3.6*  --  2.0*  --   --   --   --   --    < > = values in this interval not displayed.   Liver Function Tests: Recent Labs  Lab 04/30/2019 0517 05/07/2019 0517 05/25/19 0343 05/25/19 0343 05/26/19 0425 05/26/19 1600 05/27/19 0338 05/27/19 1644 06/01/19 0354  AST 94*  --  127*  --  171*  --  296*  --  739*  ALT 60*  --  54*  --  65*  --  110*  --  256*  ALKPHOS 31*  --  34*  --  37*  --  41  --  44  BILITOT 1.0  --  2.0*  --  1.5*  --  2.0*  --  5.0*  PROT 5.2*  --  5.4*  --  5.0*  --  5.2*  --  5.1*  ALBUMIN 3.6   < > 3.7   < > 3.4* 3.5 3.6 3.7 3.5   < > = values in this interval not displayed.   No results for input(s): LIPASE, AMYLASE in the last 168 hours. No results for input(s): AMMONIA in the last 168 hours. ABG    Component Value Date/Time   PHART 7.479 (H) 06/01/19 0558   PCO2ART 27.3 (L) 06/01/2019 0558   PO2ART 74.0 (L) 06-01-2019 0558   HCO3 20.6 01-Jun-2019 0558   TCO2 21 (L) 06/01/19 0558   ACIDBASEDEF 2.0 June 01, 2019 0558   O2SAT 97.0 2019-06-01 0558    Coagulation Profile: Recent Labs  Lab 05/23/19 2208 05/26/19 0425 05/27/19 0338  INR 2.6* 2.4* 3.0*   Cardiac Enzymes: No results for input(s): CKTOTAL, CKMB, CKMBINDEX, TROPONINI in the last 168 hours.  HbA1C: Hgb A1c MFr Bld  Date/Time  Value Ref Range Status  07/29/2016 09:14 AM 5.2 4.6 - 6.5 % Final    Comment:    Glycemic Control Guidelines for People with Diabetes:Non Diabetic:  <6%Goal of Therapy: <7%Additional Action Suggested:  >8%   07/22/2012 09:41 AM 5.5 4.6 - 6.5 % Final    Comment:    Glycemic Control Guidelines for People with Diabetes:Non Diabetic:  <6%Goal of Therapy: <7%Additional Action Suggested:  >8%    CRITICAL CARE  Kathi Ludwig, MD [place] Resident, PGY-3  Please see Attending A/P and/or Addendum for final recommendations.

## 2019-05-31 NOTE — Procedures (Signed)
Extubation Procedure Note  Patient Details:   Name: MELBERT BOTELHO DOB: 03-Jan-1941 MRN: [id number]   Airway Documentation:    Vent end date: Jun 18, 2019 Vent end time: 1458   Evaluation   Patient extubated to room air per MD order. RN and family at bedside.   Jillienne Egner H Chrishelle Zito 18-Jun-2019, 3:01 PM

## 2019-05-31 NOTE — Plan of Care (Signed)
I called and updated the daughter today regarding the patients condition. She understands that his condition is progressively worsening and we are at a state where we have little room for additional supportive therapy. She was advised that we remain optimistic that he may improve with antibiotic therapy in the next 24 hours. However, as we are not able to reverse the underlying liver failure I advised her that his prognosis is ultimately poor and I do not know if he will continue through the weekend.  We will further discuss code status when she visits today.   Kathi Ludwig, MD [place] Resident, PGY-3

## 2019-05-31 NOTE — Consult Note (Signed)
Consultation Note Date: 06-17-2019   Patient Name: Richard Saunders  DOB: Jul 11, 1940  MRN: [id number]  Age / Sex: 79 y.o., male  PCP: Richard Ghent, MD Referring Physician: Chesley Mires, MD  Reason for Consultation: Establishing goals of care and Psychosocial/spiritual support  HPI/Patient Profile: 79 y.o. male with past medical history of NASH cirrhosis, atrial fibrillation, LBBB, rheumatic fever, and anxiety who was admitted on 05/21/2019 with septic shock, acute pancreatitis, and worsening renal function. He was found to have SBP and HCAP.  He developed acute blood loss anemia and was found to have a peptic ulcer on EGD.  He unfortunately continued to decline - both his kidney and liver function worsening.  He underwent bronchoscopy and was found to have LLL collapse.  He has been placed on CRRT and intubated.  Today his BP is low despite maximal dosing on 3 pressors, his LFTs have worsened and he is unable to tolerate dialysis.  Clinical Assessment and Goals of Care:  I have reviewed medical records including EPIC notes, labs and imaging, received report from Richard Saunders ICU RN and Dr. Berline Lopes, examined the patient at bedside and spoke with his daughter Richard Saunders and niece Richard Saunders Investment banker, corporate at [employer]) as well as multiple family members on conference call  to discuss diagnosis prognosis, [place], EOL wishes, disposition and options.  I introduced Palliative Medicine as specialized medical care for people living with serious illness. It focuses on providing relief from the symptoms and stress of a serious illness.   We discussed his current illness and what it means in the larger context of his on-going co-morbidities.  Natural disease trajectory and expectations at EOL were discussed.  I attempted to elicit values and goals of care important to the patient.  Richard Saunders was clear that if her father could step back and truly see what he  is going thru right now he would not want to continue on life support.    I explained that Richard Saunders may die even on life support, but its possible that with aggressive intervention we could keep him alive for a few more days.  The difference between aggressive medical intervention and comfort care was considered in light of the patient's goals of care.   Richard Saunders's situation was complicated by the fact that he can wake and respond although he is intubated and on a tremendous amount ot support.  Richard Saunders indicated that he wanted his 3 brothers to visit.  This was arranged - we explained to the brothers that Richard Saunders will most likely pass away sometime soon so that their visit is to say goodbye.  After the larger family discussion I was able to speak 1 on 1 with Richard Saunders (daughter and HCPOA).  Richard Saunders is distraught but clear minded.  She let me know she has been the HCPOA 2x in the past for relatives who were dying.  She understands what is going on and is appropriately handling what needs to be done.  We agreed that after his brothers visit we will work towards making certain  he is sound asleep before life support is withdrawn.  We discussed that he may pass quickly or live a couple of days.  We talked about moving him to 6N.  Questions and concerns were addressed.  The family was encouraged to call with questions or concerns.     Primary Decision Maker:  HCPOA daughter Richard Saunders    SUMMARY [place]    DNR / Full comfort.  Stop antibiotics and hemodialysis now. Will allow brothers to visit and then shift to full comfort. Please ensure patient is very sedated with dilaudid and ativan prior to extubation. Extubate after family has visited.  ICU RNs discretion with timing of extubation.  Daughter is ready.   Code Status/Advance Care Planning:  DNR   Symptom Management:   End of life order set.  Will utilize dilaudid gtt with PRN boluses, ativan prn and robinul PRN  Additional Recommendations  (Limitations, Scope, Preferences):  Full Comfort Care  Palliative Prophylaxis:   Frequent Pain Assessment  Psycho-social/Spiritual:   Desire for further Chaplaincy support: Patient's pastor was present, but family may appreciate in house chaplain support as well if he lingers past today.  Prognosis:   Hours to days.  Discharge Planning: Anticipated Hospital Death      Primary Diagnoses: Present on Admission: . Severe sepsis ([place]) . Hepatic cirrhosis ([place]) . Atrial fibrillation ([place])   I have reviewed the medical record, interviewed the patient and family, and examined the patient. The following aspects are pertinent.  Past Medical History:  Diagnosis Date  . Allergy   . Anxiety   . Aortic insufficiency    a. 04/2018 Echo: EF 55-60%. Mild to Mod MR. Mild AI/TR.  Marland Kitchen Atrial fibrillation ([place])    a. Dx 04/2018. CHA2DS2VASc = 4-->Eliquis.  . Cirrhosis ([place])   . Depression   . H/O: rheumatic fever 2012   (per patient) - eval by [facility] LBBB with PVC's  . History of chicken pox   . Hypertension   . LBBB (left bundle branch block)   . Organic impotence   . PVC's (premature ventricular contractions)    Social History   Socioeconomic History  . Marital status: Single    Spouse name: Not on file  . Number of children: Not on file  . Years of education: Not on file  . Highest education level: Not on file  Occupational History  . Occupation: Retired Armed forces technical officer: retired  Tobacco Use  . Smoking status: Former Smoker    Years: 40.00    Quit date: 04/29/1978    Years since quitting: 41.1  . Smokeless tobacco: Never Used  Substance and Sexual Activity  . Alcohol use: Not Currently    Alcohol/week: 0.0 standard drinks  . Drug use: No  . Sexual activity: Not on file  Other Topics Concern  . 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   Social Determinants  of Radio broadcast assistant Strain:   . Difficulty of Paying Living Expenses: Not on file  Food Insecurity:   . Worried About Charity fundraiser in the Last Year: Not on file  . Ran Out of Food in the Last Year: Not on file  Transportation Needs:   . Lack of Transportation (Medical): Not on file  . Lack of Transportation (Non-Medical): Not on file  Physical Activity:   . Days  of Exercise per Week: Not on file  . Minutes of Exercise per Session: Not on file  Stress:   . Feeling of Stress : Not on file  Social Connections:   . Frequency of Communication with Friends and Family: Not on file  . Frequency of Social Gatherings with Friends and Family: Not on file  . Attends Religious Services: Not on file  . Active Member of Clubs or Organizations: Not on file  . Attends Archivist Meetings: Not on file  . Marital Status: Not on file   Family History  Problem Relation Age of Onset  . Cancer Mother        ovarian, uterine  . Aneurysm Mother        AAA in 25  . Cancer Father        Prostate  . COPD Father   . Prostate cancer Father   . Diabetes Other   . Heart disease Other        CAD <60 male  . Hypertension Other   . Heart disease Maternal Grandfather        died from possible "heart attack" in early 77's  . Heart failure Maternal Uncle   . Colon cancer Neg Hx    Scheduled Meds: . chlorhexidine gluconate (MEDLINE KIT)  15 mL Mouth Rinse BID  . Chlorhexidine Gluconate Cloth  6 each Topical Daily  . LORazepam  2 mg Intravenous Once  . mouth rinse  15 mL Mouth Rinse 10 times per day  . pantoprazole  40 mg Intravenous Q12H   Continuous Infusions: . sodium chloride    . sodium chloride Stopped (2019/06/04 1123)  . dexmedetomidine (PRECEDEX) IV infusion Stopped (05/27/19 1453)  . fentaNYL infusion INTRAVENOUS 25 mcg/hr (2019/06/04 1200)  . HYDROmorphone    . metronidazole 100 mL/hr at June 04, 2019 1000  . norepinephrine (LEVOPHED) Adult infusion 40 mcg/min  (Jun 04, 2019 1200)  . phenylephrine (NEO-SYNEPHRINE) Adult infusion 400 mcg/min (June 04, 2019 1200)  . prismasol BGK 4/2.5 2,000 mL/hr at 06-04-2019 1107  . sodium chloride    . vasopressin (PITRESSIN) infusion - *FOR SHOCK* 0.04 Units/min (Jun 04, 2019 1200)   PRN Meds:.Place/Maintain arterial line **AND** sodium chloride, sodium chloride, acetaminophen **OR** acetaminophen, antiseptic oral rinse, fentaNYL, glycopyrrolate **OR** glycopyrrolate **OR** glycopyrrolate, haloperidol **OR** haloperidol **OR** haloperidol lactate, HYDROmorphone, levalbuterol, LORazepam **OR** LORazepam **OR** LORazepam, ondansetron (ZOFRAN) IV, ondansetron **OR** ondansetron (ZOFRAN) IV, polyvinyl alcohol, sodium chloride, sodium chloride Allergies  Allergen Reactions  . Colchicine Diarrhea  . Fluticasone Propionate Other (See Comments)    He didn't tolerate the taste/smell prev  . Nasacort [Triamcinolone] Other (See Comments)    Didn't tolerate   Review of Systems intubated.  Physical Exam  Chronically ill appearing man intubated.  Will open eyes but is very quick to drop back to sleep +jaundice CV tachy resp intubated, NAD LE 1-2+ pitting edema  Vital Signs: BP (!) 72/60   Pulse (!) 124   Temp (!) 97.2 F (36.2 C)   Resp (!) 26   Ht _0  (1.854 m)   Wt 106.1 kg   SpO2 100%   BMI 30.86 kg/m  Pain Scale: CPOT   Pain Score: 0-No pain   SpO2: SpO2: 100 % O2 Device:SpO2: 100 % O2 Flow Rate: .O2 Flow Rate (L/min): 2 L/min  IO: Intake/output summary:   Intake/Output Summary (Last 24 hours) at 06-04-19 1230 Last data filed at 04-Jun-2019 1200 Gross per 24 hour  Intake 3648.72 ml  Output 2458 ml  Net 1190.72 ml  LBM: Last BM Date: 05/26/18 Baseline Weight: Weight: 85.8 kg Most recent weight: Weight: 106.1 kg     Palliative Assessment/Data: 10%     Time In: 10:00 Time Out: 12:00 Time Total: 120 min. Visit consisted of counseling and education dealing with the complex and emotionally intense  issues surrounding the need for palliative care and symptom management in the setting of serious and potentially life-threatening illness. Greater than 50%  of this time was spent counseling and coordinating care related to the above assessment and plan.  Signed by: Florentina Jenny, PA-C Palliative Medicine Pager: [phone]  Please contact Palliative Medicine Team phone at [phone] for questions and concerns.  For individual provider: See Shea Evans

## 2019-05-31 NOTE — Progress Notes (Addendum)
CDS notified and the referral number is [id number].

## 2019-05-31 NOTE — Progress Notes (Signed)
[facility] ROUNDING NOTE   Subjective:   Is a 79 year old gentleman history aortic insufficiency, atrial fibrillation, hypertension nonalcoholic cirrhosis and ascites.  He had left the [facility] after a stay 1 night 05/12/2019 to 05/13/2019 but return to the emergency room 04/30/2019 with hypotension and a CT scan that was consistent with acute pancreatitis.  He has had progressively worse renal function despite aggressive volume resuscitation.  His ultrasound of his kidneys showed small atrophic right kidney with increased echogenicity of the right renal parenchyma and nonvisualization of the left kidney and hepatic cirrhosis with intra-abdominal ascites.  05/25/2019.  He also underwent CT scan of his chest that showed atelectasis and collapse of the left lower lobe and lingula hepatic cirrhosis with moderate volume intra-abdominal ascites and age indeterminant compression deformity of the superior endplate of T3.  Patient was initiated on CRRT 05/26/2019.  Patient in positive fluid balance with no net ultrafiltration.  Positive fluid balance 1.3 L 05/27/2019  Blood pressure 78/56 pulse 130 temperature 96.1 O2 sats 90% 40% FiO2  Sodium 137 potassium 4.1 chloride 107 CO2 19 BUN 56 creatinine 1.54 glucose 100 calcium 8.1 alkaline phosphatase 44 magnesium 2.3 phosphorus 2.1 AST 739 ALT 256 WBC 16 hemoglobin 11.1 platelets 34  IV vasopressin 0.04 IV norepinephrine 18 mcg/min IV Protonix IV phenylephrine 400 mics per minute    Urinalysis was bland with no white cells no red blood cells 05/25/2019 urine sodium less than 10    Objective:  Vital signs in last 24 hours:  Temp:  [95 F (35 C)-97.2 F (36.2 C)] 96.1 F (35.6 C) (01/29 0815) Pulse Rate:  [30-130] 130 (01/29 0815) Resp:  [17-27] 26 (01/29 0329) BP: (77-134)/(32-107) 86/60 (01/29 0800) SpO2:  [94 %-100 %] 98 % (01/29 0815) Arterial Line BP: (69-97)/(48-62) 82/55 (01/29 0815) FiO2 (%):  [40 %-100  %] 40 % (01/29 0800) Weight:  [106.1 kg] 106.1 kg (01/29 0417)  Weight change: -0.5 kg Filed Weights   05/26/19 0400 05/27/19 0349 June 07, 2019 0417  Weight: 103.2 kg 106.6 kg 106.1 kg    Intake/Output: I/O last 3 completed shifts: In: 5351.2 [P.O.:50; I.V.:3601.3; Other:188; NG/GT:273.7; IV Piggyback:1238.3] Out: 3395 [Urine:45; Emesis/NG output:150; Other:3200]   Intake/Output this shift:  Total I/O In: 127.9 [I.V.:107.9; NG/GT:20] Out: 48 [Other:68]  Patient intubated and sedated CVS-tachycardic irregular rate and rhythm systolic murmur left sternal edge RS-mechanical breath sounds bilaterally ABD-hypoactive bowel sounds with ascites clinically EXT-1+ pitting edema   Basic Metabolic Panel: Recent Labs  Lab 05/26/19 0425 05/26/19 0425 05/26/19 1600 05/26/19 1600 05/27/19 0338 05/27/19 0338 05/27/19 0900 05/27/19 1140 05/27/19 1644 2019/06/07 0354 June 07, 2019 0558  NA 139   < > 138   < > 138   < > 139 139 137 137 138  K 4.8   < > 4.8   < > 4.7   < > 4.6 4.5 4.6 4.1 4.1  CL 107  --  106  --  105  --   --   --  105 107  --   CO2 23  --  23  --  21*  --   --   --  18* 19*  --   GLUCOSE 173*  --  139*  --  130*  --   --   --  105* 100*  --   BUN 94*  --  74*  --  55*  --   --   --  33* 26*  --   CREATININE 2.68*  --  2.17*  --  2.02*  --   --   --  1.70* 1.54*  --   CALCIUM 9.2   < > 8.6*   < > 8.1*  --   --   --  8.4* 8.1*  --   MG  --   --   --   --  2.2  --   --   --   --  2.3  --   PHOS  --   --  3.5  --  4.0  --   --   --  2.7 2.1*  --    < > = values in this interval not displayed.    Liver Function Tests: Recent Labs  Lab 05/30/2019 0517 05/26/2019 0517 05/25/19 0343 05/25/19 0343 05/26/19 0425 05/26/19 1600 05/27/19 0338 05/27/19 1644 05/30/19 0354  AST 94*  --  127*  --  171*  --  296*  --  739*  ALT 60*  --  54*  --  65*  --  110*  --  256*  ALKPHOS 31*  --  34*  --  37*  --  41  --  44  BILITOT 1.0  --  2.0*  --  1.5*  --  2.0*  --  5.0*  PROT 5.2*  --   5.4*  --  5.0*  --  5.2*  --  5.1*  ALBUMIN 3.6   < > 3.7   < > 3.4* 3.5 3.6 3.7 3.5   < > = values in this interval not displayed.   No results for input(s): LIPASE, AMYLASE in the last 168 hours. No results for input(s): AMMONIA in the last 168 hours.  CBC: Recent Labs  Lab 05/23/19 2208 05/21/2019 0643 04/30/2019 1800 05/01/2019 1800 05/25/19 0343 05/25/19 1207 05/26/19 0425 05/26/19 0425 05/27/19 0338 05/27/19 0900 05/27/19 1140 05-30-2019 0354 05-30-2019 0558  WBC 26.3*   < > 34.0*  --  28.4*  --  26.4*  --  23.2*  --   --  16.8*  --   NEUTROABS 20.0*  --   --   --  23.0*  --   --   --   --   --   --   --   --   HGB 11.6*   < > 13.0   < > 12.3*   < > 11.9*   < > 12.2* 11.6* 11.2* 11.1* 10.9*  HCT 36.0*   < > 40.1   < > 37.3*   < > 36.2*   < > 38.0* 34.0* 33.0* 34.1* 32.0*  MCV 113.9*   < > 105.2*  --  105.1*  --  104.0*  --  107.6*  --   --  105.6*  --   PLT 161   < > 172  --  141*  --  122*  --  65*  --   --  34*  --    < > = values in this interval not displayed.    Cardiac Enzymes: No results for input(s): CKTOTAL, CKMB, CKMBINDEX, TROPONINI in the last 168 hours.  BNP: Invalid input(s): POCBNP  CBG: Recent Labs  Lab 05/26/19 1540 05/27/19 1928 05/27/19 2312 05-30-19 0312 05-30-19 0716  GLUCAP 108* 82 85 95 79    Microbiology: Results for orders placed or performed during the hospital encounter of 05/07/2019  Blood Culture (routine x 2)     Status: None   Collection Time: 04/30/2019  2:10 PM   Specimen: BLOOD  Result Value Ref Range Status   Specimen Description BLOOD RIGHT ANTECUBITAL  Final   Special Requests   Final    BOTTLES DRAWN AEROBIC ONLY Blood Culture results may not be optimal due to an inadequate volume of blood received in culture bottles   Culture   Final    NO GROWTH 5 DAYS Performed at [facility] [address], [place], [place] [address]    Report Status 05/19/2019 FINAL  Final  Blood Culture (routine x 2)     Status: None    Collection Time: 05/30/2019  2:10 PM   Specimen: BLOOD  Result Value Ref Range Status   Specimen Description BLOOD LEFT ANTECUBITAL  Final   Special Requests   Final    BOTTLES DRAWN AEROBIC AND ANAEROBIC Blood Culture adequate volume   Culture   Final    NO GROWTH 5 DAYS Performed at [facility], [place] [address], [place], [place] [address]    Report Status 05/19/2019 FINAL  Final  Respiratory Panel by RT PCR (Flu A&B, Covid) - Nasopharyngeal Swab     Status: None   Collection Time: 05/04/2019  2:20 PM   Specimen: Nasopharyngeal Swab  Result Value Ref Range Status   SARS Coronavirus 2 by RT PCR NEGATIVE NEGATIVE Final    Comment: (NOTE) SARS-CoV-2 target nucleic acids are NOT DETECTED. The SARS-CoV-2 RNA is generally detectable in upper respiratoy specimens during the acute phase of infection. The lowest concentration of SARS-CoV-2 viral copies this assay can detect is 131 copies/mL. A negative result does not preclude SARS-Cov-2 infection and should not be used as the sole basis for treatment or other patient management decisions. A negative result may occur with  improper specimen collection/handling, submission of specimen other than nasopharyngeal swab, presence of viral mutation(s) within the areas targeted by this assay, and inadequate number of viral copies (<131 copies/mL). A negative result must be combined with clinical observations, patient history, and epidemiological information. The expected result is Negative. Fact Sheet for Patients:  PinkCheek.be Fact Sheet for Healthcare Providers:  GravelBags.it This test is not yet ap proved or cleared by the [place] FDA and  has been authorized for detection and/or diagnosis of SARS-CoV-2 by FDA under an Emergency Use Authorization (EUA). This EUA will remain  in effect (meaning this test can be used) for the duration of the COVID-19 declaration under Section  564(b)(1) of the Act, 21 U.S.C. section 360bbb-3(b)(1), unless the authorization is terminated or revoked sooner.    Influenza A by PCR NEGATIVE NEGATIVE Final   Influenza B by PCR NEGATIVE NEGATIVE Final    Comment: (NOTE) The Xpert Xpress SARS-CoV-2/FLU/RSV assay is intended as an aid in  the diagnosis of influenza from Nasopharyngeal swab specimens and  should not be used as a sole basis for treatment. Nasal washings and  aspirates are unacceptable for Xpert Xpress SARS-CoV-2/FLU/RSV  testing. Fact Sheet for Patients: PinkCheek.be Fact Sheet for Healthcare Providers: GravelBags.it This test is not yet approved or cleared by the [place] FDA and  has been authorized for detection and/or diagnosis of SARS-CoV-2 by  FDA under an Emergency Use Authorization (EUA). This EUA will remain  in effect (meaning this test can be used) for the duration of the  Covid-19 declaration under Section 564(b)(1) of the Act, 21  U.S.C. section 360bbb-3(b)(1), unless the authorization is  terminated or revoked. Performed at [facility], [place] [address], [place], [place] [address]   C difficile quick  scan w PCR reflex     Status: None   Collection Time: 05/15/19  1:35 AM   Specimen: STOOL  Result Value Ref Range Status   C Diff antigen NEGATIVE NEGATIVE Final   C Diff toxin NEGATIVE NEGATIVE Final   C Diff interpretation No C. difficile detected.  Final    Comment: Performed at [facility], [place] [address], [place], [place] [address]  GI pathogen panel by PCR, stool     Status: None   Collection Time: 05/15/19  1:35 AM   Specimen: Stool  Result Value Ref Range Status   Plesiomonas shigelloides NOT DETECTED NOT DETECTED Final   Yersinia enterocolitica NOT DETECTED NOT DETECTED Final   Vibrio NOT DETECTED NOT DETECTED Final   Enteropathogenic E coli NOT DETECTED NOT DETECTED Final   E coli (ETEC) LT/ST NOT DETECTED NOT  DETECTED Final   E coli 5631 by PCR Not applicable NOT DETECTED Final   Cryptosporidium by PCR NOT DETECTED NOT DETECTED Final   Entamoeba histolytica NOT DETECTED NOT DETECTED Final   Adenovirus F 40/41 NOT DETECTED NOT DETECTED Final   Norovirus GI/GII NOT DETECTED NOT DETECTED Final   Sapovirus NOT DETECTED NOT DETECTED Final    Comment: (NOTE) Performed At: [facility] [place], [place] [id number] Rush Farmer MD [id number]    Vibrio cholerae NOT DETECTED NOT DETECTED Final   Campylobacter by PCR NOT DETECTED NOT DETECTED Final   Salmonella by PCR NOT DETECTED NOT DETECTED Final   E coli (STEC) NOT DETECTED NOT DETECTED Final   Enteroaggregative E coli NOT DETECTED NOT DETECTED Final   Shigella by PCR NOT DETECTED NOT DETECTED Final   Cyclospora cayetanensis NOT DETECTED NOT DETECTED Final   Astrovirus NOT DETECTED NOT DETECTED Final   G lamblia by PCR NOT DETECTED NOT DETECTED Final   Rotavirus A by PCR NOT DETECTED NOT DETECTED Final  Urine culture     Status: None   Collection Time: 05/15/19  9:10 AM   Specimen: In/Out Cath Urine  Result Value Ref Range Status   Specimen Description IN/OUT CATH URINE  Final   Special Requests NONE  Final   Culture   Final    NO GROWTH Performed at [facility], [address], [place], [place] [address]    Report Status 05/16/2019 FINAL  Final  Body fluid culture     Status: None   Collection Time: 05/16/19  3:12 PM   Specimen: Peritoneal Washings; Body Fluid  Result Value Ref Range Status   Specimen Description PERITONEAL  Final   Special Requests Normal  Final   Gram Stain   Final    FEW WBC PRESENT,BOTH PMN AND MONONUCLEAR NO ORGANISMS SEEN    Culture   Final    NO GROWTH 3 DAYS Performed at [facility], [address], [place], [place] [address]    Report Status 05/19/2019 FINAL  Final  Body fluid culture     Status: None   Collection Time: 05/18/19  5:29 PM   Specimen: Body Fluid   Result Value Ref Range Status   Specimen Description FLUID PARACENTESIS  Final   Special Requests NONE  Final   Gram Stain   Final    FEW WBC PRESENT, PREDOMINANTLY PMN NO ORGANISMS SEEN    Culture   Final    NO GROWTH Performed at [facility], [address], [place], [place] [address]    Report Status 05/22/2019  FINAL  Final  Body fluid culture     Status: None   Collection Time: 05/18/19  5:31 PM   Specimen: Body Fluid  Result Value Ref Range Status   Specimen Description FLUID LEFT PLEURAL  Final   Special Requests NONE  Final   Gram Stain   Final    FEW WBC PRESENT,BOTH PMN AND MONONUCLEAR NO ORGANISMS SEEN    Culture   Final    NO GROWTH Performed at [facility], [address] [place], [place] [address]    Report Status 05/21/2019 FINAL  Final  MRSA PCR Screening     Status: None   Collection Time: 05/06/2019  3:23 AM   Specimen: Nasal Mucosa; Nasopharyngeal  Result Value Ref Range Status   MRSA by PCR NEGATIVE NEGATIVE Final    Comment:        The GeneXpert MRSA Assay (FDA approved for NASAL specimens only), is one component of a comprehensive MRSA colonization surveillance program. It is not intended to diagnose MRSA infection nor to guide or monitor treatment for MRSA infections. Performed at [facility], [place] [address] [place], [place] [address]   Body fluid culture (includes gram stain)     Status: None (Preliminary result)   Collection Time: 05/26/19 10:25 AM   Specimen: Pleural Fluid  Result Value Ref Range Status   Specimen Description PLEURAL LEFT  Final   Special Requests NONE  Final   Gram Stain   Final    RARE WBC PRESENT, PREDOMINANTLY MONONUCLEAR NO ORGANISMS SEEN    Culture   Final    NO GROWTH 2 DAYS Performed at [facility], [place] [address] [place], [place] [address]    Report Status PENDING  Incomplete  Culture, respiratory (non-expectorated)     Status: None (Preliminary result)   Collection Time:  05/27/19 11:50 AM   Specimen: Endotracheal; Respiratory  Result Value Ref Range Status   Specimen Description ENDOTRACHEAL  Final   Special Requests NONE  Final   Gram Stain   Final    RARE WBC PRESENT, PREDOMINANTLY MONONUCLEAR RARE GRAM POSITIVE RODS Performed at [facility], [address], [place], [place] [address]    Culture PENDING  Incomplete   Report Status PENDING  Incomplete  Culture, respiratory     Status: None (Preliminary result)   Collection Time: 05/27/19  5:36 PM   Specimen: Bronchoalveolar Lavage; Respiratory  Result Value Ref Range Status   Specimen Description BRONCHIAL ALVEOLAR LAVAGE  Final   Special Requests LEFT LOWER LOBE  Final   Gram Stain   Final    RARE WBC PRESENT,BOTH PMN AND MONONUCLEAR NO ORGANISMS SEEN Performed at [facility], [address], [place], [place] [address]    Culture PENDING  Incomplete   Report Status PENDING  Incomplete    Coagulation Studies: Recent Labs    05/26/19 0425 05/27/19 0338  LABPROT 26.4* 31.1*  INR 2.4* 3.0*    Urinalysis: Recent Labs    05/25/19 0904  COLORURINE AMBER*  LABSPEC 1.018  PHURINE 5.0  GLUCOSEU NEGATIVE  HGBUR NEGATIVE  BILIRUBINUR NEGATIVE  KETONESUR NEGATIVE  PROTEINUR 30*  NITRITE NEGATIVE  LEUKOCYTESUR NEGATIVE      Imaging: DG CHEST PORT 1 VIEW  Result Date: 05/27/2019 CLINICAL DATA:  Sepsis EXAM: PORTABLE CHEST 1 VIEW COMPARISON:  05/27/2019, 05/26/2019, CT chest 05/25/2019 FINDINGS: Endotracheal tube tip just above the carina. Esophageal tube tip in the left upper quadrant. Left-sided central  venous catheter tip slightly curved to the right, likely at junction of persistent left SVC and coronary sinus. No change in diffuse opacity in the left thorax. Patchy airspace disease at the right base. Obscured cardiomediastinal silhouette. IMPRESSION: 1. No significant change in diffuse opacity in the left thorax, probably representing combination of pleural effusion and  underlying airspace disease 2. Patchy atelectasis or mild infiltrate at the right base 3. Interval intubation. Tip of the endotracheal tube is just above the carina. New esophageal tube, tip projects over the proximal stomach Electronically Signed   By: Donavan Foil M.D.   On: 05/27/2019 17:52   Portable Chest x-ray  Result Date: 05/27/2019 CLINICAL DATA:  Endotracheal tube EXAM: PORTABLE CHEST 1 VIEW COMPARISON:  05/27/2019 FINDINGS: Endotracheal tube in good position. Left jugular central venous catheter unchanged in position. The tip is to the left of midline. Catheter appears to be in a persistent left SVC based on prior CT. Complete opacification left chest due to pleural effusion and collapse of the left lung. No interval change. Slight right lower lobe atelectasis unchanged.  NG in the stomach. IMPRESSION: Complete opacification left chest unchanged. Support lines unchanged in position. Electronically Signed   By: Franchot Gallo M.D.   On: 05/27/2019 10:52   DG CHEST PORT 1 VIEW  Result Date: 05/27/2019 CLINICAL DATA:  Central line placement EXAM: PORTABLE CHEST 1 VIEW COMPARISON:  Radiograph 05/26/2019, CT 05/25/2019 FINDINGS: Left IJ central venous catheter tip terminates within a persistent left SVC which appears to drain into the coronary sinus/right atrium on comparison CT. Appears to have been slightly retracted since comparison study additional telemetry leads overlie the chest. There is complete opacification left hemithorax likely reflecting increasing pleural effusion. Underlying parenchymal changes in the left lung are impossible to further evaluate. There are some streaky opacities in the right lung base favoring atelectasis. No right pleural effusion. No visible pneumothorax. Right heart border is unremarkable. The remaining cardiomediastinal contours are obscured by overlying opacity. No acute osseous or soft tissue abnormality. Degenerative changes are present in the imaged spine and  shoulders. IMPRESSION: 1. Left IJ central venous catheter tip terminates within a persistent left SVC which appears to drain into the coronary sinus/right atrium on comparison CT. Appears to have been slightly retracted since comparison study. 2. Complete opacification of the left hemithorax likely reflecting increasing pleural effusion. 3. Right basilar atelectasis. Electronically Signed   By: Lovena Le M.D.   On: 05/27/2019 00:36   DG Chest Port 1 View  Result Date: 05/26/2019 CLINICAL DATA:  Thoracentesis on the left. EXAM: PORTABLE CHEST 1 VIEW COMPARISON:  CT chest and chest radiograph 05/25/2019. FINDINGS: Trachea is midline. Heart size is grossly stable. Left pleural effusion is moderate in size and appears similar to yesterday's exam. No definite pneumothorax after left thoracentesis. Left basilar collapse/consolidation. Probable atelectasis at the right lung base. Left IJ central line is seen in a persistent left SVC. IMPRESSION: 1. No definite pneumothorax after left thoracentesis. Moderate left pleural effusion appears similar to yesterday's exam. 2. Left lower lobe collapse/consolidation, possibly due to pneumonia. Centrally obstructing lesion cannot be excluded. 3. Right basilar atelectasis. Electronically Signed   By: Lorin Picket M.D.   On: 05/26/2019 11:36     Medications:   .  prismasol BGK 4/2.5 500 mL/hr at 2019/06/06 0726  .  prismasol BGK 4/2.5 500 mL/hr at Jun 06, 2019 0843  . sodium chloride    . sodium chloride 10 mL/hr at 06-06-2019 0800  . albumin human    .  ceFEPime (MAXIPIME) IV 2 g (06/20/19 0803)  . dexmedetomidine (PRECEDEX) IV infusion Stopped (05/27/19 1453)  . fentaNYL infusion INTRAVENOUS 50 mcg/hr (06-20-2019 0800)  . metronidazole 500 mg (06/20/2019 0901)  . norepinephrine (LEVOPHED) Adult infusion 32 mcg/min (06/20/19 0846)  . phenylephrine (NEO-SYNEPHRINE) Adult infusion Stopped (05/26/19 2027)  . phenylephrine (NEO-SYNEPHRINE) Adult infusion 400 mcg/min  (2019/06/20 0800)  . prismasol BGK 4/2.5 2,000 mL/hr at 20-Jun-2019 0825  . sodium chloride    . vancomycin    . vasopressin (PITRESSIN) infusion - *FOR SHOCK* 0.04 Units/min (06/20/2019 0800)   . chlorhexidine gluconate (MEDLINE KIT)  15 mL Mouth Rinse BID  . Chlorhexidine Gluconate Cloth  6 each Topical Daily  . feeding supplement (VITAL HIGH PROTEIN)  1,000 mL Per Tube Q24H  . mouth rinse  15 mL Mouth Rinse 10 times per day  . pantoprazole  40 mg Intravenous Q12H   Place/Maintain arterial line **AND** sodium chloride, sodium chloride, acetaminophen **OR** acetaminophen, fentaNYL, heparin, levalbuterol, midazolam, ondansetron (ZOFRAN) IV, sodium chloride, sodium chloride  Assessment/ Plan:   Acute kidney injury with hypotension and ascites with adequate volume resuscitation and oliguric acute renal failure.  Urine sodium less than 10 urinalysis bland.  This is consistent with hepatorenal syndrome.  It could also be consistent with early acute tubular necrosis.  The renal ultrasound did not reveal any evidence of hydronephrosis although did have an absent kidney on the right.  CRRT initiated 06/18/2540  Metabolic acidosis IV sodium bicarbonate discontinued  Hyperkalemia resolved with correction of metabolic acidosis  Hypertension/volume appears adequately volume resuscitated at this point will DC IV fluids.  Hypotension now on 3 pressors Levophed, phenylephrine and vasopressin  Cirrhosis picture appears like hepatorenal syndrome is developing with ascites pleural effusions third spacing urine sodium less than 10.  Octreotide has been discontinued 05/26/2019.  Anemia continue to follow no obvious source of GI blood loss high risk for varices prophylaxis against peptic ulcer disease.  Upper endoscopy acute gastritis nonbleeding gastric ulcer grade 1 esophageal varices  SBP prophylaxis.  Rocephin discontinued 05/26/2019  Acute diarrhea with no infectious etiology discovered appears to be  slowing.  Acute on chronic liver injury.  INR 3.  Increased liver enzymes  Acute respiratory failure.  Patient now intubated status post CT scan chest without contrast complete atelectasis and collapse of left lower lobe and lingula with partial atelectasis and collapse of right lower lobe associated with air bronchograms.  Hepatic cirrhosis with moderate intra-abdominal ascites.  Age indeterminate mild compression deformities involving superior endplate T3.   LOS: [place] _0 _1 :02 AM

## 2019-05-31 NOTE — Progress Notes (Signed)
Wasted 70 ml of dilaudid drip with [name] in stericycle. Bartholomew Crews, RN 03-Jun-2019 6:06 PM

## 2019-05-31 NOTE — Progress Notes (Signed)
Patient passed away with his daughter and two brothers at his bedside. Elink informed and CDS updated. Modena Morrow E, RN 06-06-2019 1530

## 2019-05-31 NOTE — Progress Notes (Signed)
Nutrition Follow-up  DOCUMENTATION CODES:   Not applicable  INTERVENTION:    Continue Vital High Protein at 20 ml/h for now.   Pending decisions regarding goals of care recommend increasing TF rate slowly by 10 ml every 8 hours to goal rate of 60 ml/h (1440 ml per day).   Pro-stat 30 ml QID.   At goal rate with Pro-stat, will provide 1840 kcal, 186 gm protein, 1204 ml free water daily.  NUTRITION DIAGNOSIS:   Increased nutrient needs related to acute illness(AKI on CRRT, hepatorenal syndrome) as evidenced by estimated needs.  Ongoing  GOAL:   Patient will meet greater than or equal to 90% of their needs   Progressing  MONITOR:   Vent status, TF tolerance, Labs, I & O's, Weight trends, Skin  REASON FOR ASSESSMENT:   Ventilator, Consult Enteral/tube feeding initiation and management  ASSESSMENT:   79 yo male admitted with sepsis. Now with hepatorenal syndrome, spontaneous bacterial peritonitis. PMH includes PVS, aortic insufficiency, HTN, cirrhosis, ascites.   S/P paracentesis on 1/17 and 1/19. S/P thoracentesis on 1/19 and 1/27. HD cath placed 1/26, replaced 1/27. CRRT started on 1/27.   Patient remains on vent support, multiple pressors, and CRRT. Palliative Care team following to help family decide on goals of care. Code status changed to partial this morning.  Currently receiving Vital High Protein at 20 ml/h via OG tube, tolerating well.  Abdomen is distended with gross ascites, hypoactive bowel sounds this morning.   Patient remains intubated on ventilator support. MV: 10.6 L/min Temp (24hrs), Avg:96.2 F (35.7 C), Min:95 F (35 C), Max:97.2 F (36.2 C)   Labs reviewed. BUN 26 (H), creatinine 1.54 (H), ionized calcium 1.08 (L), phos 2.1 (L) CBG's: 95-79-100  Medications reviewed and include precedex, flagyl, levophed, phenylephrine, vasopressin.  Admission weight 85.8 kg 05/06/2019 (lowest weight since admission/dry weight) I/O +18.8 L since  admission. Patient continues to have severe edema to all extremities.   Intake/Output Summary (Last 24 hours) at 06-22-2019 1201 Last data filed at 2019-06-22 1200 Gross per 24 hour  Intake 3648.72 ml  Output 2442 ml  Net 1206.72 ml    NUTRITION - FOCUSED PHYSICAL EXAM:  unable to complete  Diet Order:   Diet Order            Diet NPO time specified  Diet effective now              EDUCATION NEEDS:   Not appropriate for education at this time  Skin:  Skin Assessment: Reviewed RN Assessment  Last BM:  1/28 type 7  Height:   Ht Readings from Last 1 Encounters:  05/27/19 6' 1"  (1.854 m)    Weight:   Wt Readings from Last 1 Encounters:  06/22/19 106.1 kg  Admission weight 85.8 kg  Ideal Body Weight:  83.6 kg  BMI:  Body mass index is 30.86 kg/m.  Estimated Nutritional Needs:   Kcal:  1890  Protein:  175-200 gm  Fluid:  >/= 1.9 L    Molli Barrows, RD, LDN, [place] Pager [phone] After Hours Pager [phone]

## 2019-05-31 NOTE — Progress Notes (Signed)
[facility] Progress Note Patient Name: [name] DOB: 12-09-1940 MRN: [id number]   Date of Service  06/13/19  HPI/Events of Note  PH 7.47, PCO2 27.3 RR 26 on ventilator  eICU Interventions  RR reduced to 23        Leylani Duley U Erbie Arment Jun 13, 2019, 6:11 AM

## 2019-05-31 DEATH — deceased

## 2019-06-01 ENCOUNTER — Telehealth: Payer: Self-pay

## 2019-06-01 NOTE — Telephone Encounter (Signed)
Received a phone call from Priddy @ [facility].  DC will be signed by Doctor Halford Chessman. I told Ysidro Evert this information and he will take it to the pulmonary Office.

## 2019-06-18 ENCOUNTER — Ambulatory Visit: Payer: Medicare HMO | Admitting: Interventional Cardiology

## 2019-06-28 NOTE — Death Summary Note (Signed)
Richard Saunders was a 79 y.o. male admitted on 05/27/2019 with nausea, vomiting and diarrhea from GI sepsis.  Found to have cirrhosis, ascites, pancreatitis, and b/l pleural effusions.  He had thoracentesis and paracentesis.  Started on antibiotics and IV fluids.  Developed Upper GI bleeding.  Seen by GI.  He developed worsening renal failure and started on CRRT.  He developed pneumonia with worsening acidosis.  Required intubation.  Seen by palliative care.  Family opted for comfort measures.  He was extubated on 2019/06/20 and    Final diagnoses: Acute hypoxic respiratory failure NASH with cirrhosis and ascites Upper GI bleeding with esophageal varices and gastric ulcer Spontaneous bacterial peritonitis Acute pancreatitis Transudate pleural effusion Septic shock Bacterial HCAP Duplicate SVC Acute kidney injury from ATN 2nd to sepsis and hypoxia Hepatorenal syndrome Atrial fibrillation with rapid ventricular response present prior to admission Chronic left diaphragm elevation Coagulopathy in setting of liver disease Hyperkalemia Elevated LFTs from shock  Chesley Mires, MD [place] 05/31/2019, 12:56 PM

## 2019-08-06 NOTE — Telephone Encounter (Signed)
Cancelled Order

## 2020-02-05 IMAGING — DX DG CHEST 1V PORT
1 series · 1 of 1 positions shown · non-contrast
Comparison: June 18, 2018.

CLINICAL DATA: Atrial fibrillation.

EXAM:
PORTABLE CHEST 1 VIEW

[chest ap]
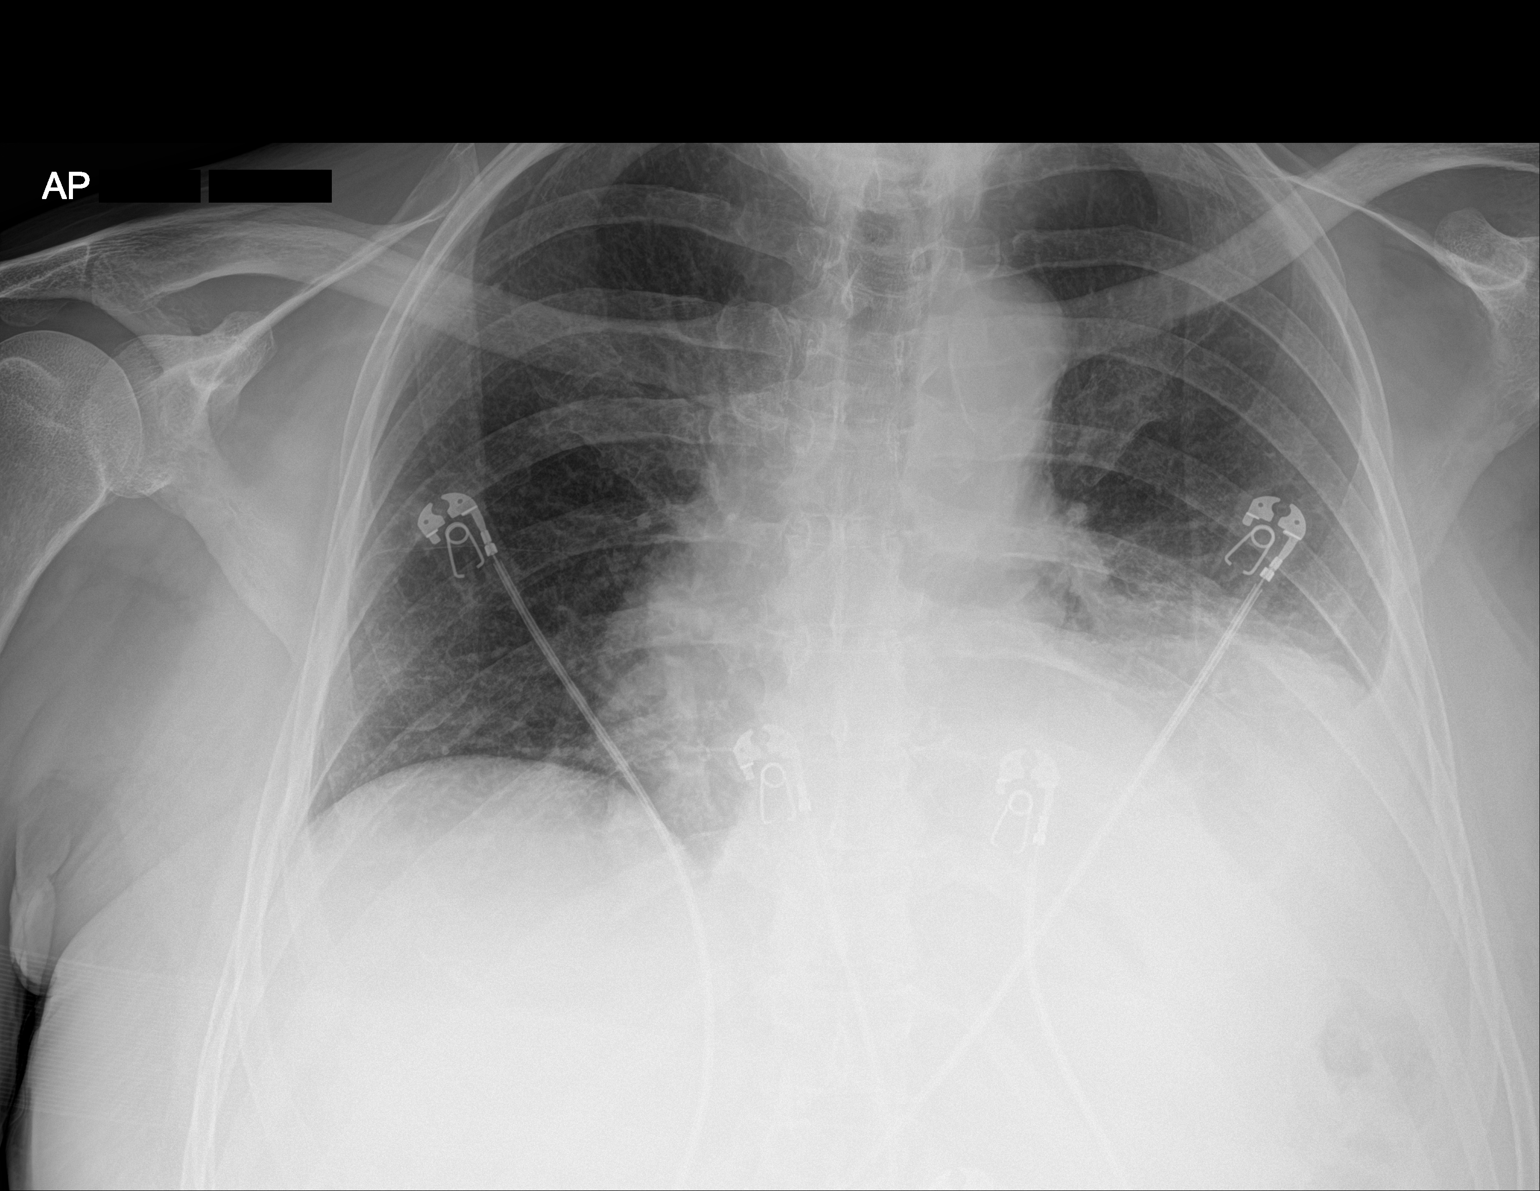

[1 of 1 positions shown; findings below may reference images not displayed]

FINDINGS: Stable cardiomediastinal silhouette. Right lung is clear. No
pneumothorax is noted. Mild left basilar atelectasis or infiltrate
is noted with associated pleural effusion. Bony thorax is
unremarkable.
IMPRESSION: Mild left basilar atelectasis or infiltrate is noted with associated
pleural effusion.

## 2020-02-16 IMAGING — CT CT CHEST W/O CM
2 of 4 series · 14 of 36 positions shown, 17 images · non-contrast
Comparison: Chest radiograph-earlier same day; CT abdomen and
pelvis - 05/14/2019

CLINICAL DATA: Central venous catheter placement with chest
radiograph concerning for malpositioning. Acquired ABG from the
catheter demonstrates a venous positioning with CVP measurement of 8
to 10.

EXAM:
CT CHEST WITHOUT CONTRAST
TECHNIQUE: Multidetector CT imaging of the chest was performed following the
standard protocol without IV contrast.

[Series 3: chest w/o 2mm st · axial · non-contrast · 0.96mm/px · z∈[+1392,+1642]mm · 11 of 147 slices shown, 14 images]
[im 11/147  mediastinal]
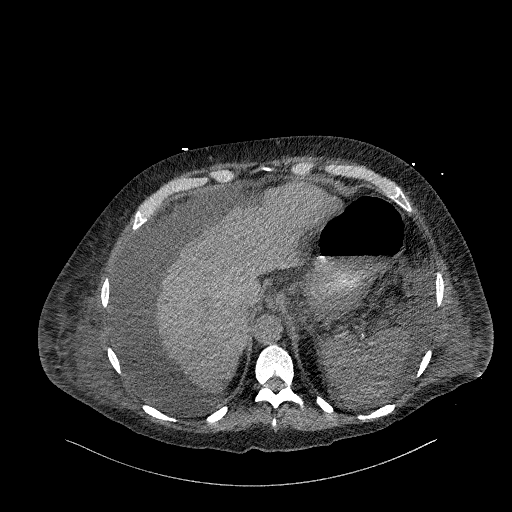
[im 11/147  lung]
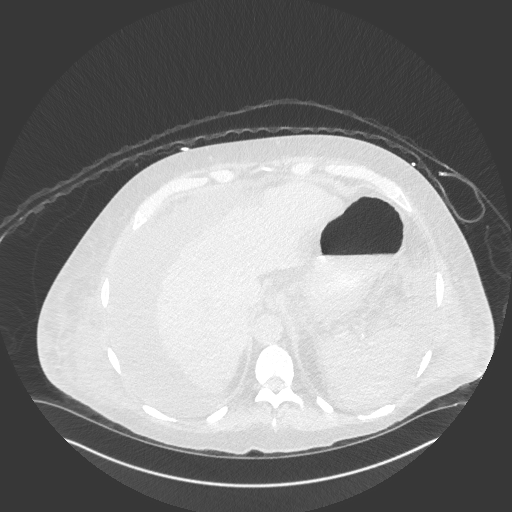
[im 21/147  lung]
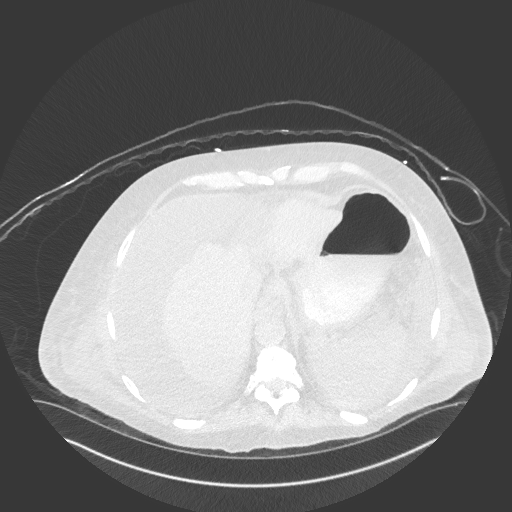
[im 32/147  lung]
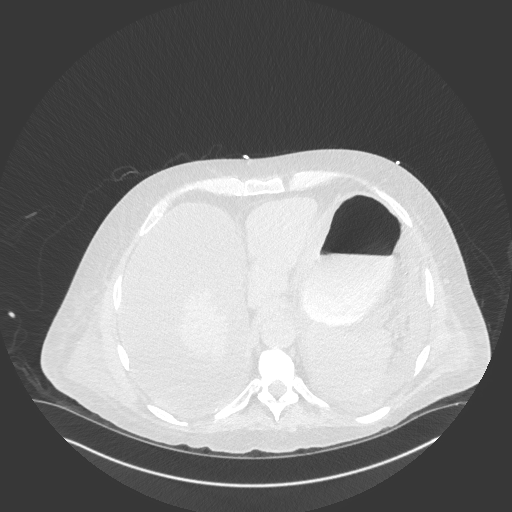
[im 53/147  lung]
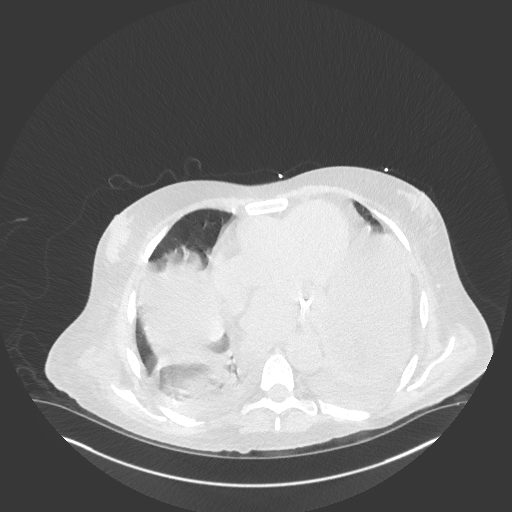
[im 63/147  mediastinal]
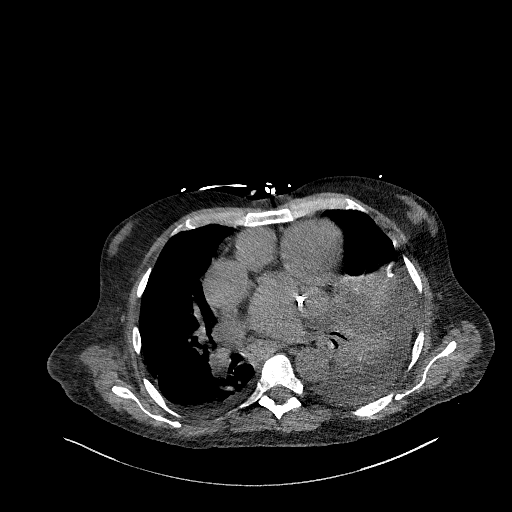
[im 63/147  lung]
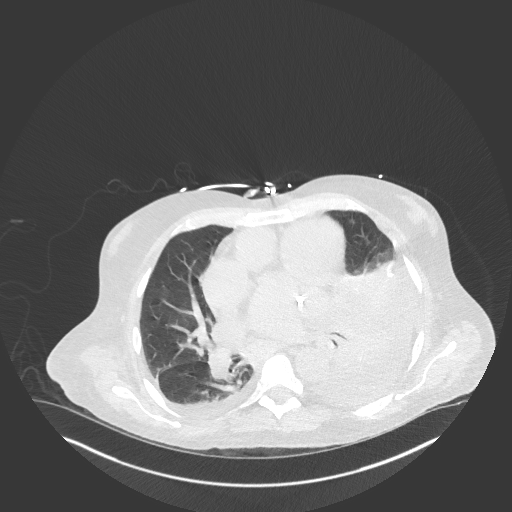
[im 74/147  lung]
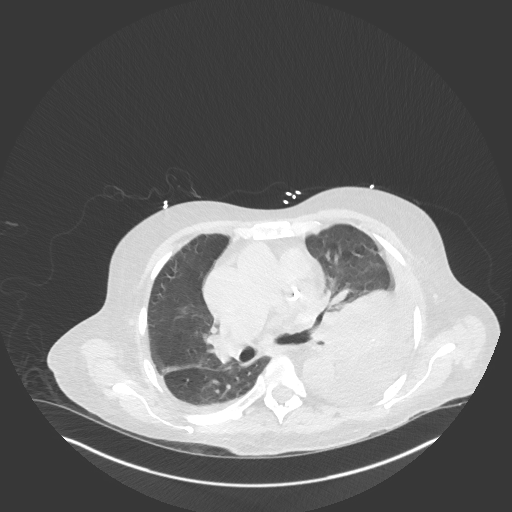
[im 84/147  lung]
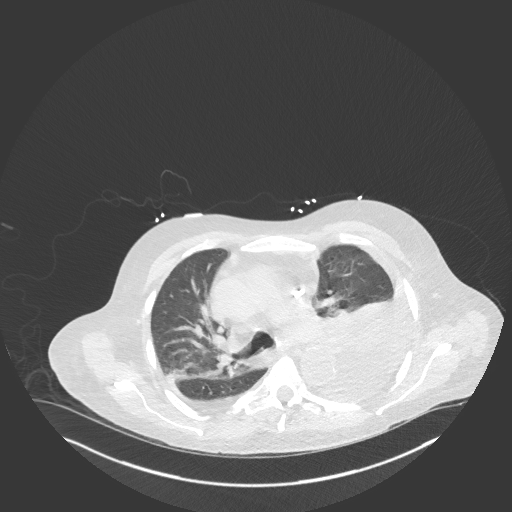
[im 94/147  lung]
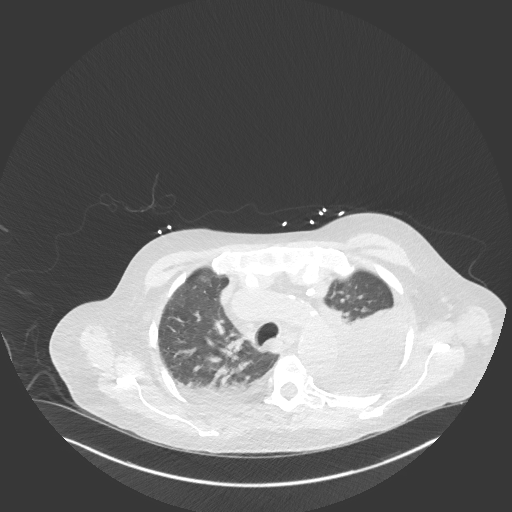
[im 115/147  mediastinal]
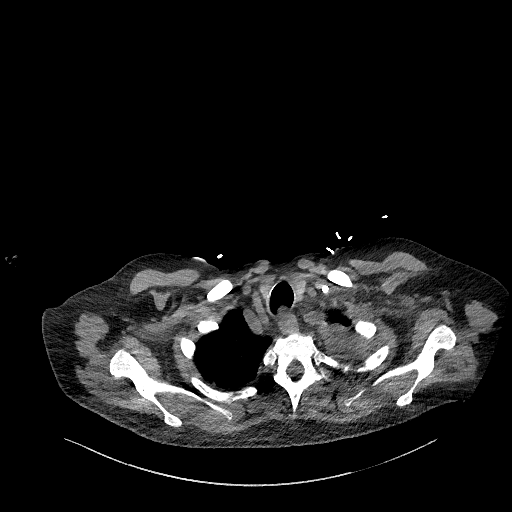
[im 115/147  lung]
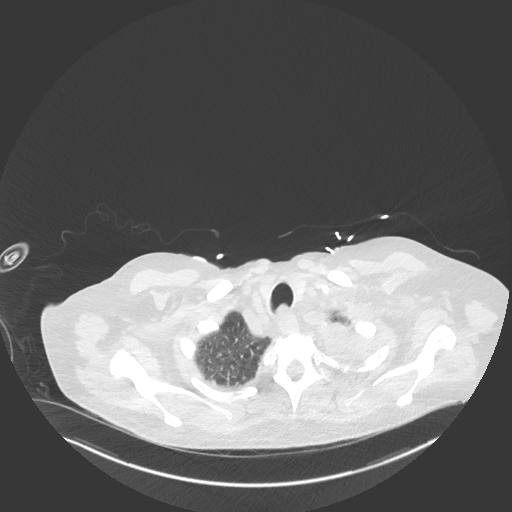
[im 126/147  lung]
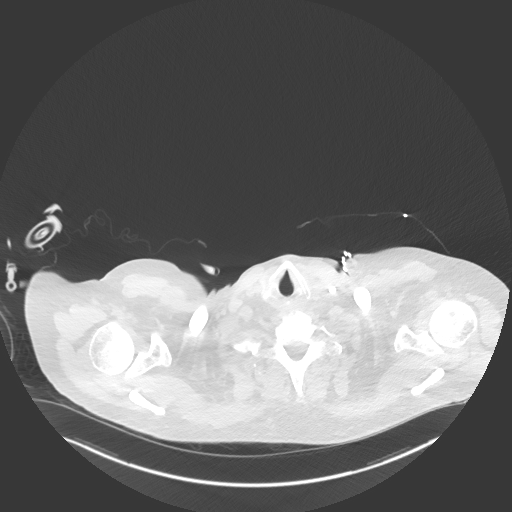
[im 136/147  lung]
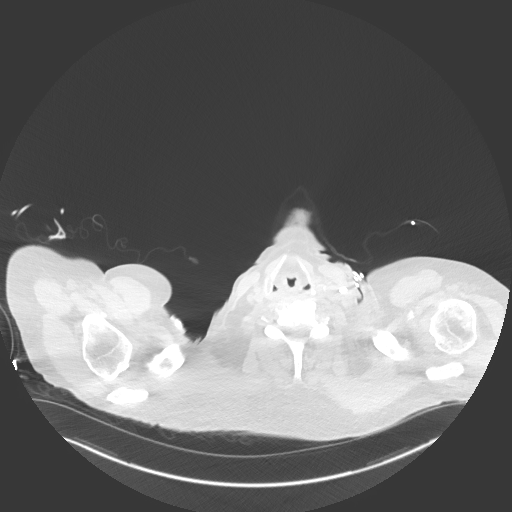

[Series 6: chest w/o 3mm st cor · coronal · non-contrast · 0.71mm/px · 3 of 112 slices shown]
[im 23/112  lung]
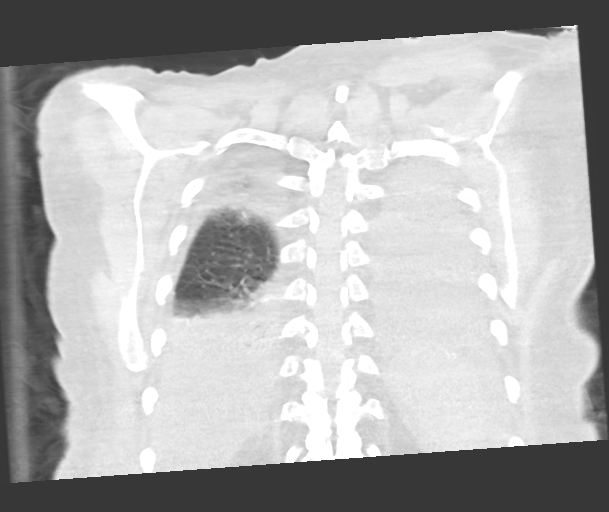
[im 45/112  lung]
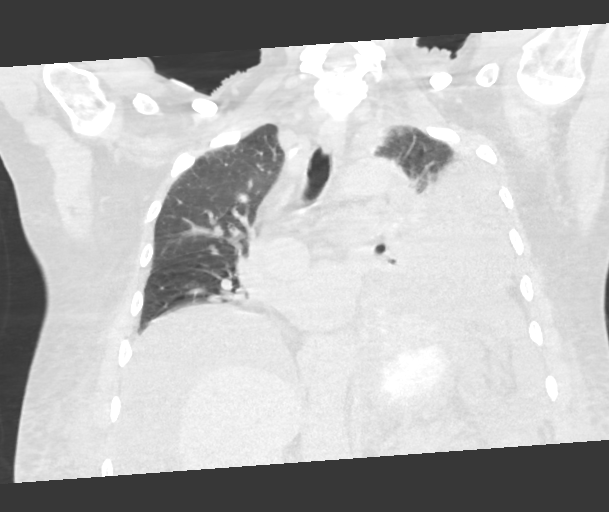
[im 67/112  lung]
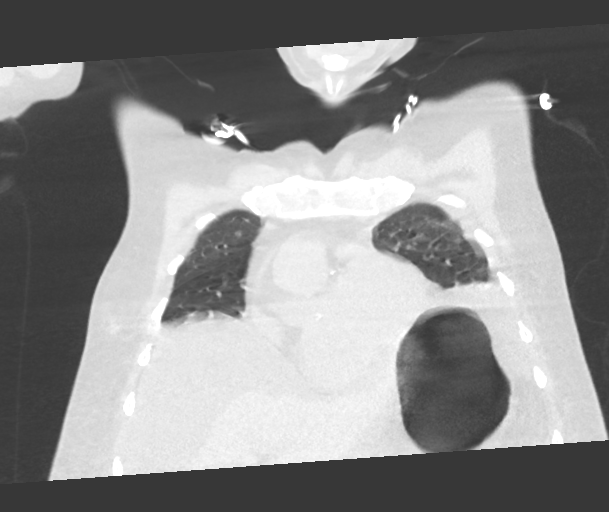

[14 of 36 positions shown; findings below may reference images not displayed]

FINDINGS: Cardiovascular: Tip of dialysis catheter terminates within the
caudal aspect of a duplicated SVC which appears to drain into the
right atrium though this is suboptimally evaluated on this
noncontrast examination. No definite evidence of partial anomalous
venous return on this noncontrast examination.

Normal heart size. Coronary artery calcifications. No pericardial
effusion. Normal caliber of the thoracic aorta. Bovine configuration
of the aortic arch.

Mediastinum/Nodes: No bulky mediastinal, hilar axillary
lymphadenopathy on this noncontrast examination.

Lungs/Pleura: Small left-sided pleural effusion with associated
consolidation/collapse involving the left lower lobe and lingula
with associated air bronchograms. Trace right-sided pleural effusion
with associated right lower lobe atelectasis/collapse and air
bronchograms.

The remaining central pulmonary airways appear widely patent. No
pneumothorax.

Note is made of a punctate granuloma within the right upper lobe
(image 31, series 5).

Upper Abdomen: Limited noncontrast evaluation of the upper abdomen
demonstrates marked nodularity of the hepatic contour with moderate
volume intra-abdominal ascites. No evidence of splenomegaly.

Musculoskeletal: Age-indeterminate though presumably chronic mild
(approximately 25%) compression deformity involving the superior
endplate of the T3 vertebral body without associated fracture line,
retropulsion or definitive paraspinal hematoma. Mild-to-moderate
multilevel DDD within the cervical spine.

Mild diffuse body wall anasarca.  Symmetric bilateral gynecomastia.
IMPRESSION: 1. Tip of left internal jugular approach dialysis catheter appears
to terminate within the caudal aspect of a duplicated SVC. No
evidence of partial anomalous pulmonary venous return on this
noncontrast examination. No pneumothorax.
2. Complete atelectasis/collapse of the left lower lobe and lingula
with partial atelectasis/collapse of the right lower lobe with
associated air bronchograms. Concomitant infection not excluded on
the basis of this examination.
3. Hepatic cirrhosis with moderate volume intra-abdominal ascites.
No evidence of splenomegaly.
4. Age-indeterminate mild (approximately 25%) compression deformity
involving the superior endplate of the T3 vertebral body.
Correlation for point tenderness at this location is advised.

## 2020-02-16 IMAGING — US US RENAL
1 series · 14 of 25 positions shown · non-contrast
Comparison: Prior CT from earlier the same day.

CLINICAL DATA: Initial evaluation for acute renal insufficiency.

EXAM:
RENAL / URINARY TRACT ULTRASOUND COMPLETE

[Series 1: us renal · 14 of 29 slices shown]
[im 1/29]
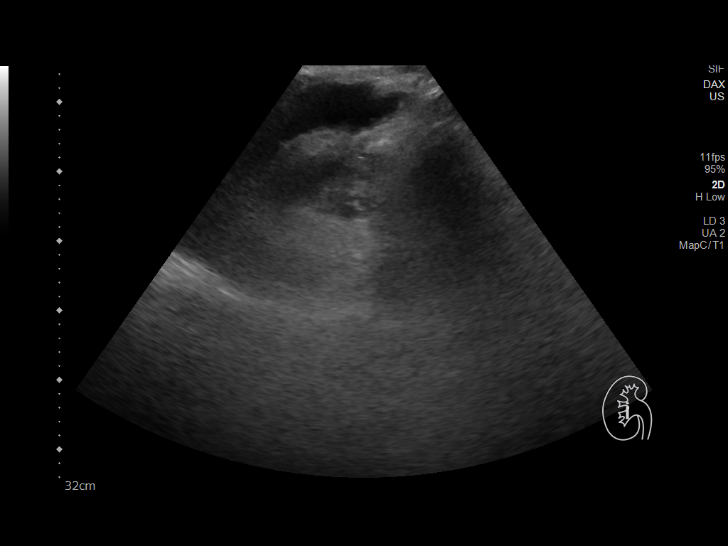
[im 3/29]
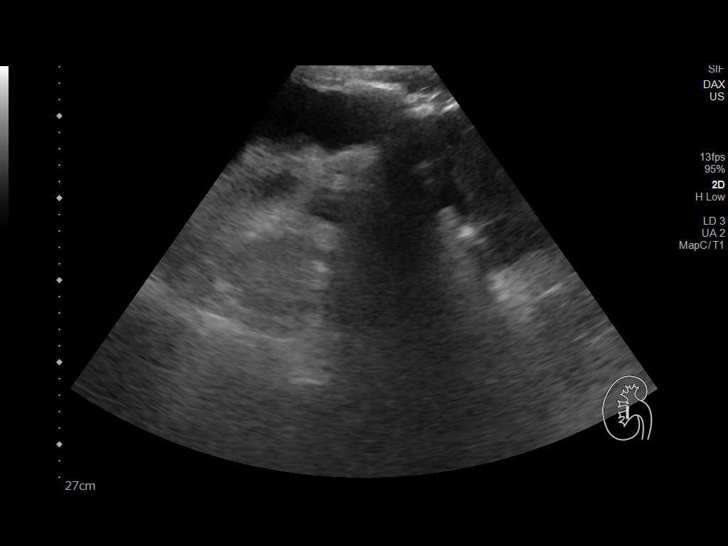
[im 5/29]
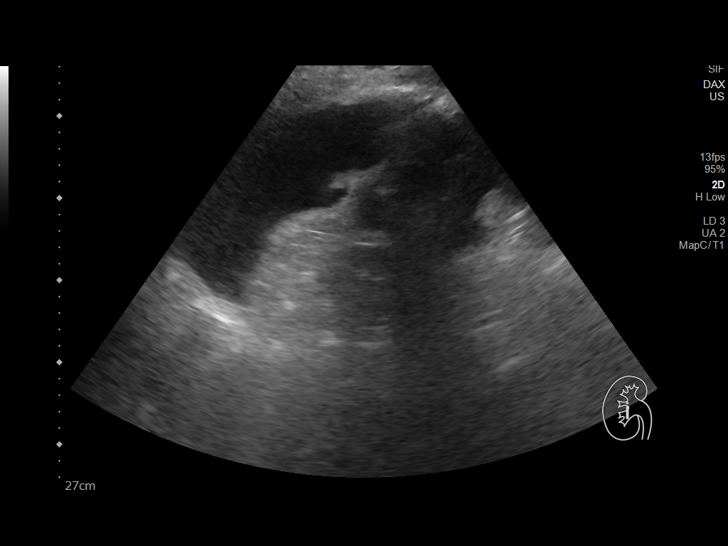
[im 8/29]
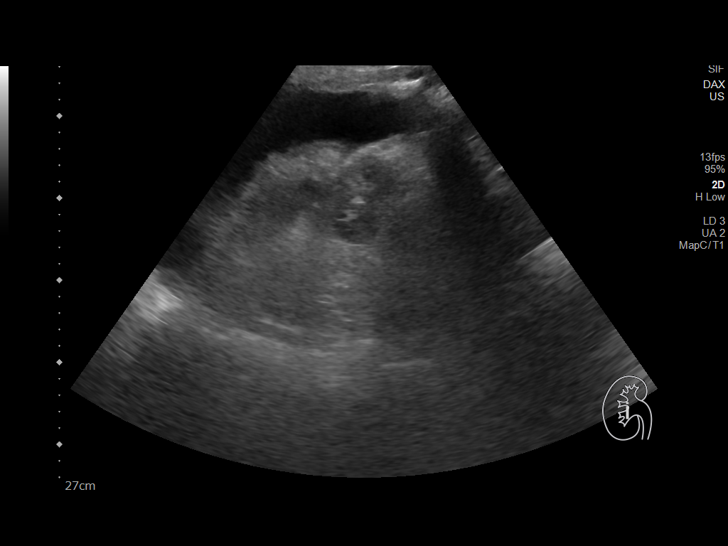
[im 10/29]
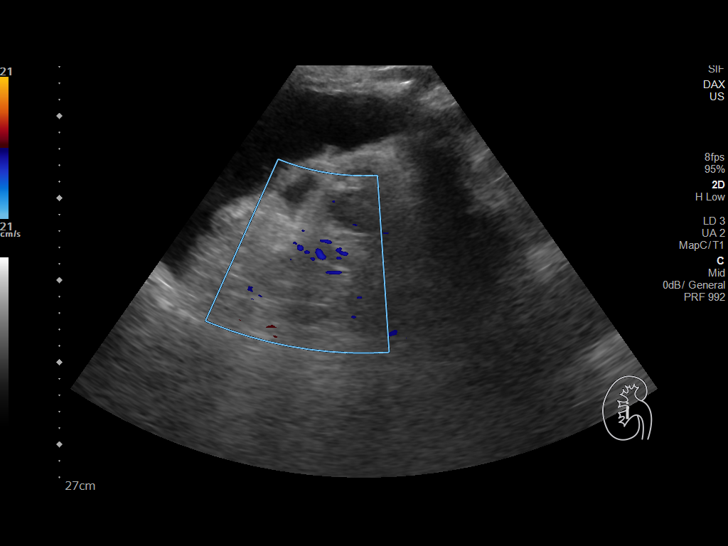
[im 11/29]
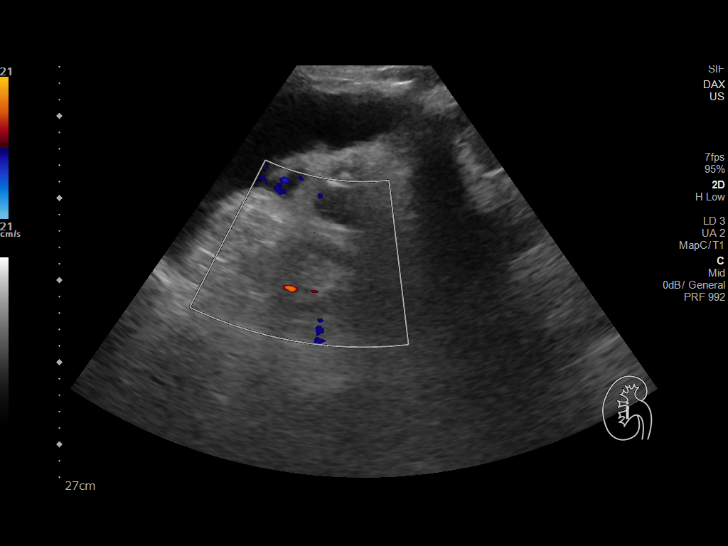
[im 13/29]
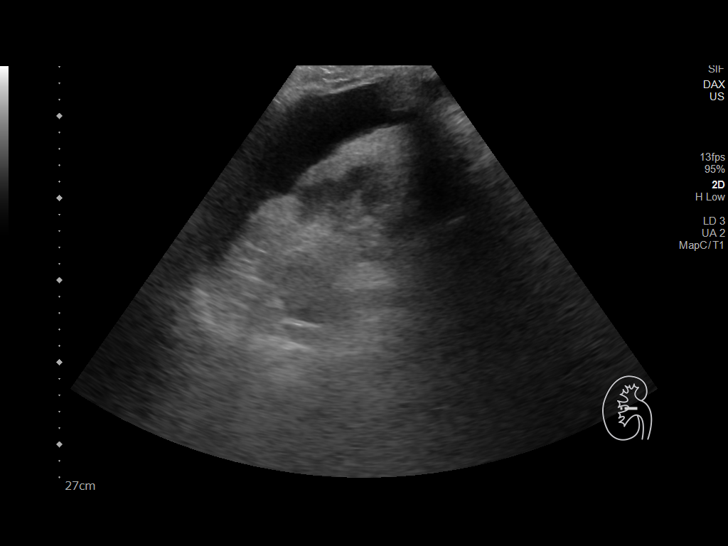
[im 16/29]
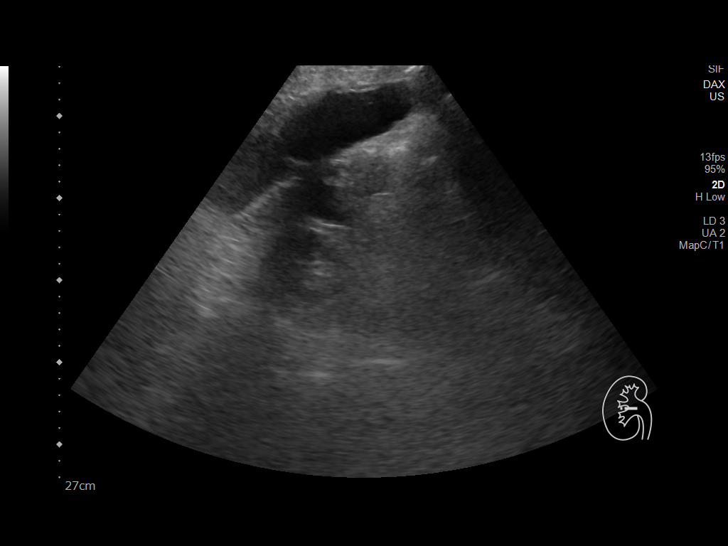
[im 18/29]
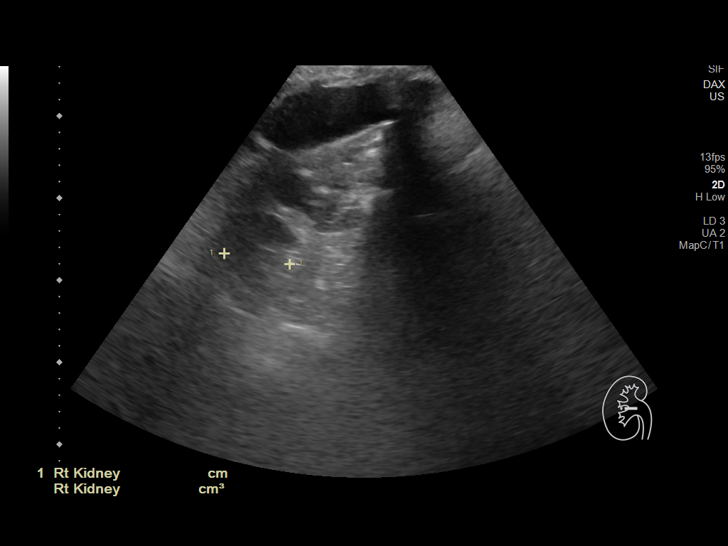
[im 19/29]
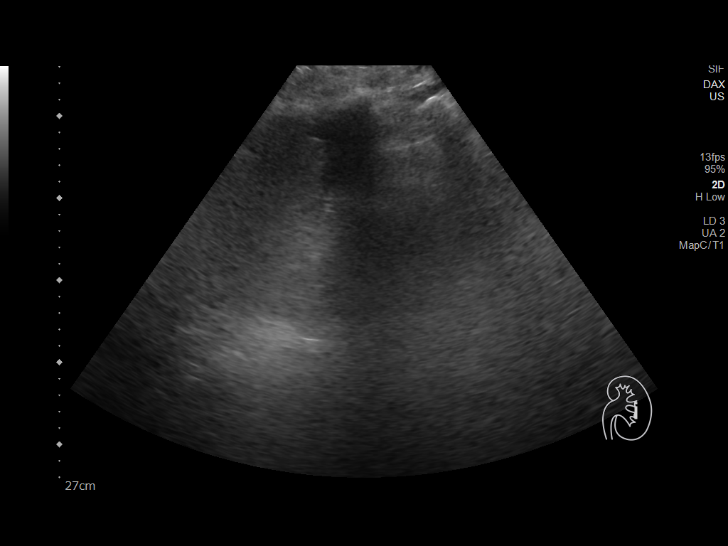
[im 22/29]
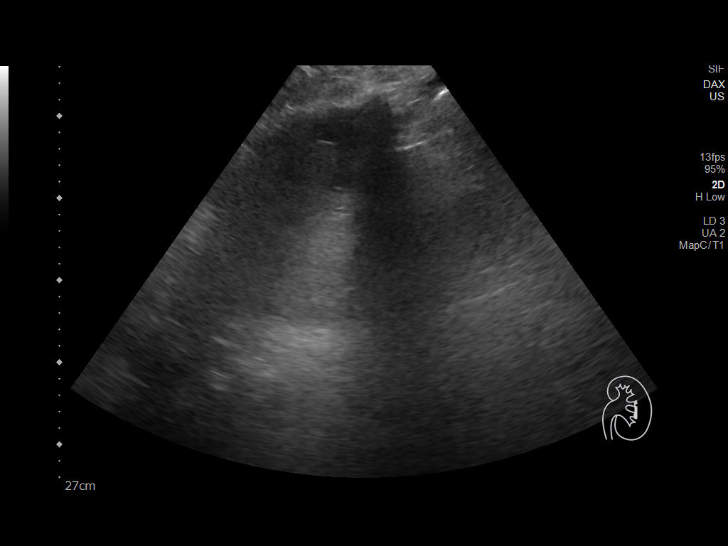
[im 24/29]
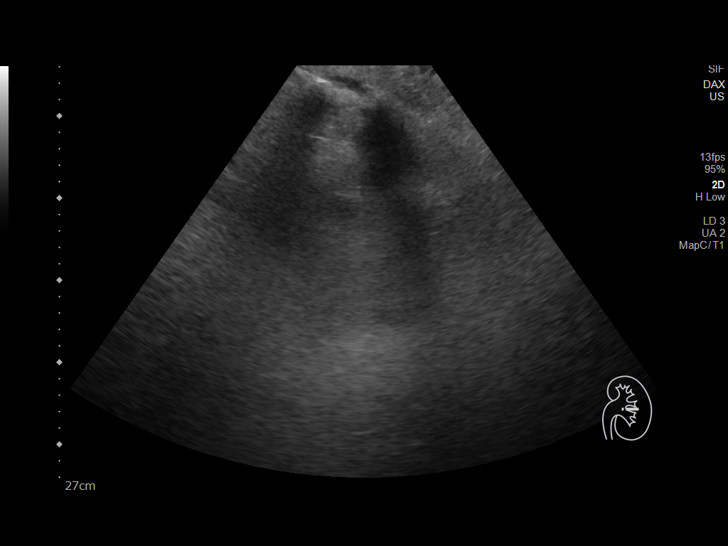
[im 26/29]
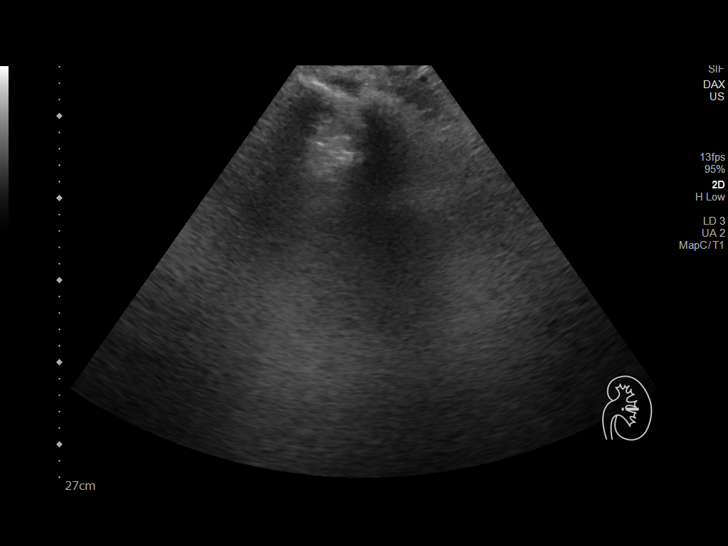
[im 29/29]
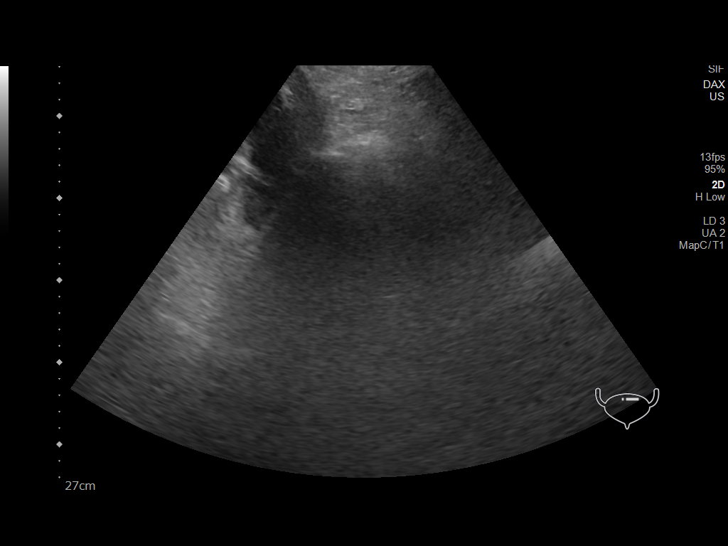

[14 of 25 positions shown; findings below may reference images not displayed]

FINDINGS: Right Kidney:

Renal measurements: 9.4 x 4.5 x 4.0 cm = volume: 88.8 mL. Right
kidney is small and atrophic in appearance with increased
echogenicity within the renal parenchyma, compatible with chronic
medical renal disease. No visible nephrolithiasis or hydronephrosis.
No focal renal mass.

Left Kidney:

Not visualized.

Bladder:

Appears normal for degree of bladder distention.

Other:

Hepatic cirrhosis with intra-abdominal ascites partially visualized.
IMPRESSION: 1. Small and atrophic right kidney with increased echogenicity
within the right renal parenchyma, compatible with chronic medical
renal disease. No hydronephrosis.
2. Nonvisualization of the left kidney.
3. Hepatic cirrhosis with intra-abdominal ascites, partially
visualized.

## 2020-02-18 IMAGING — DX DG CHEST 1V PORT
1 series · 1 of 1 positions shown · non-contrast
Comparison: 05/27/2019

CLINICAL DATA: Endotracheal tube

EXAM:
PORTABLE CHEST 1 VIEW

[chest ap]
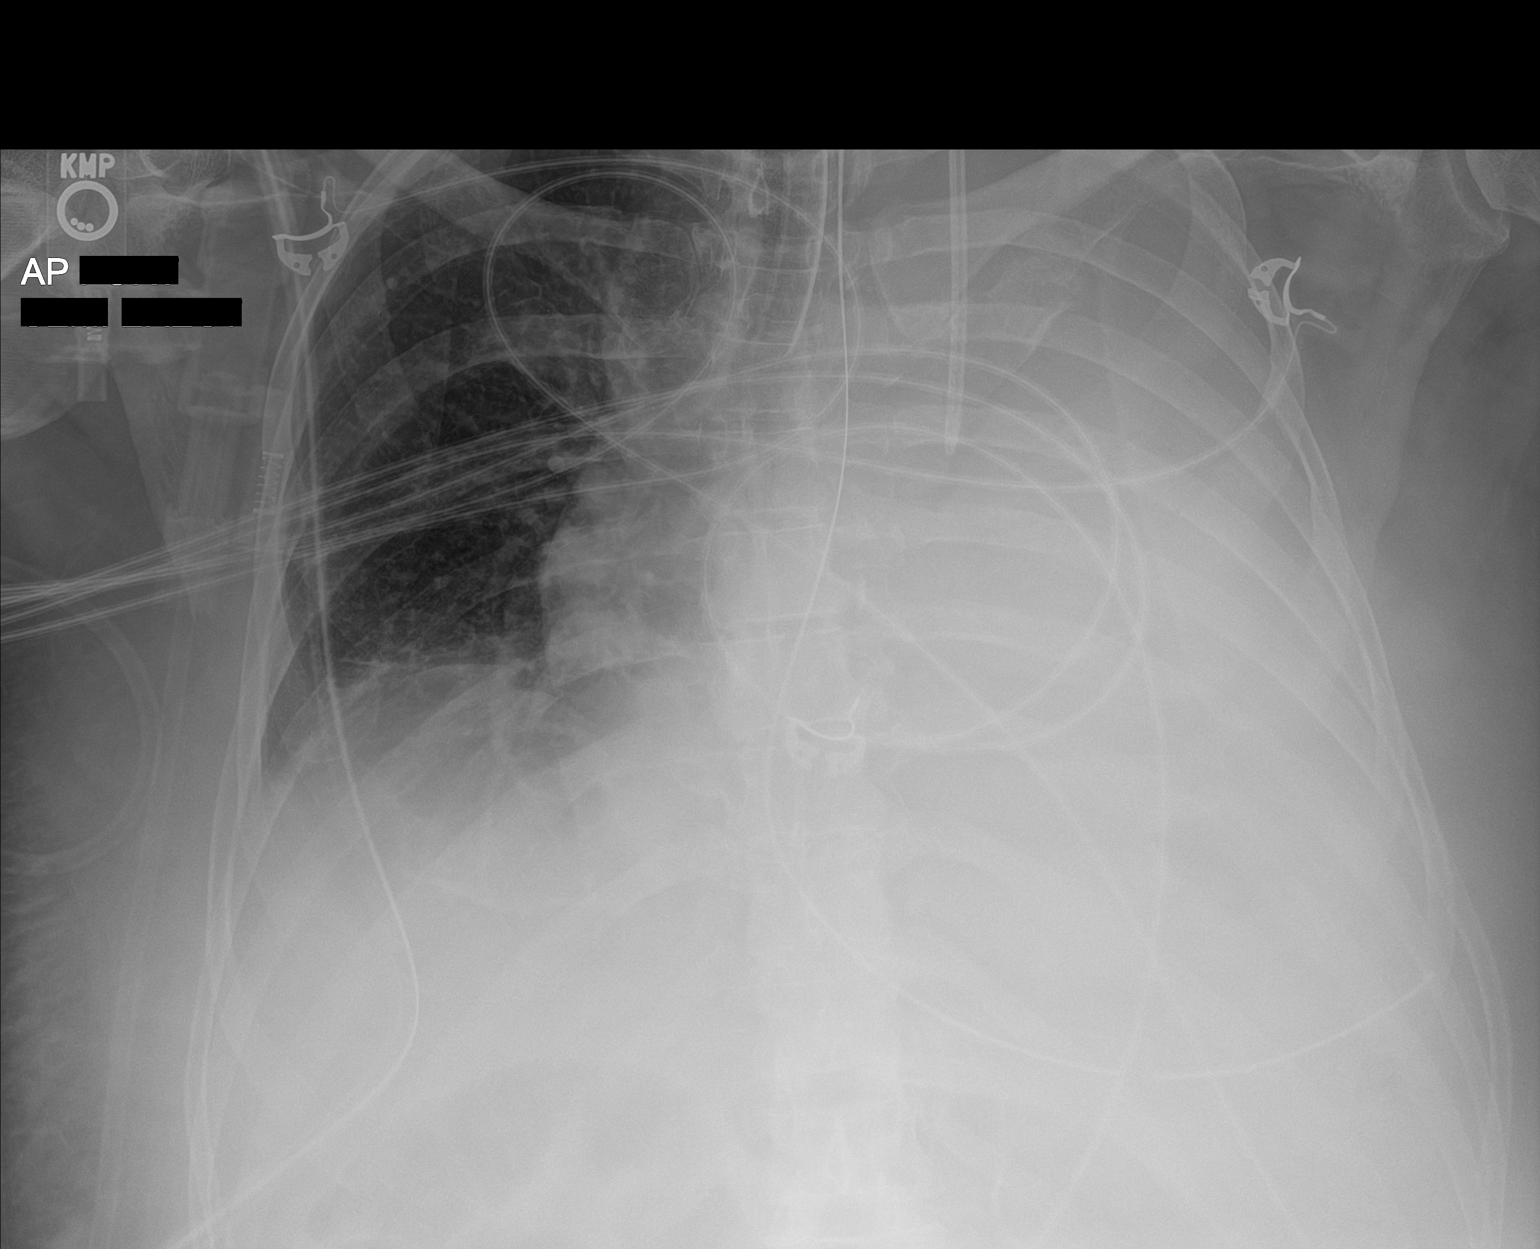

[1 of 1 positions shown; findings below may reference images not displayed]

FINDINGS: Endotracheal tube in good position. Left jugular central venous
catheter unchanged in position. The tip is to the left of midline.
Catheter appears to be in a persistent left SVC based on prior CT.

Complete opacification left chest due to pleural effusion and
collapse of the left lung. No interval change.

Slight right lower lobe atelectasis unchanged.  NG in the stomach.
IMPRESSION: Complete opacification left chest unchanged. Support lines unchanged
in position.
